# Patient Record
Sex: Female | Born: 1935 | Race: White | Hispanic: No | Smoking: Never smoker
Health system: Southern US, Community
[De-identification: ages and names within clinical notes are randomized; demographics above are authoritative.]

## PROBLEM LIST (undated history)

## (undated) DIAGNOSIS — Z8719 Personal history of other diseases of the digestive system: Secondary | ICD-10-CM

## (undated) DIAGNOSIS — M51369 Other intervertebral disc degeneration, lumbar region without mention of lumbar back pain or lower extremity pain: Secondary | ICD-10-CM

## (undated) DIAGNOSIS — F419 Anxiety disorder, unspecified: Secondary | ICD-10-CM

## (undated) DIAGNOSIS — K8071 Calculus of gallbladder and bile duct without cholecystitis with obstruction: Secondary | ICD-10-CM

## (undated) DIAGNOSIS — R0600 Dyspnea, unspecified: Secondary | ICD-10-CM

## (undated) DIAGNOSIS — B029 Zoster without complications: Secondary | ICD-10-CM

## (undated) DIAGNOSIS — K219 Gastro-esophageal reflux disease without esophagitis: Secondary | ICD-10-CM

## (undated) DIAGNOSIS — C189 Malignant neoplasm of colon, unspecified: Secondary | ICD-10-CM

## (undated) DIAGNOSIS — M6282 Rhabdomyolysis: Secondary | ICD-10-CM

## (undated) DIAGNOSIS — F1910 Other psychoactive substance abuse, uncomplicated: Secondary | ICD-10-CM

## (undated) DIAGNOSIS — I10 Essential (primary) hypertension: Secondary | ICD-10-CM

## (undated) DIAGNOSIS — T8149XA Infection following a procedure, other surgical site, initial encounter: Secondary | ICD-10-CM

## (undated) DIAGNOSIS — K5900 Constipation, unspecified: Secondary | ICD-10-CM

## (undated) DIAGNOSIS — M810 Age-related osteoporosis without current pathological fracture: Secondary | ICD-10-CM

## (undated) DIAGNOSIS — F32A Depression, unspecified: Secondary | ICD-10-CM

## (undated) DIAGNOSIS — M5136 Other intervertebral disc degeneration, lumbar region: Secondary | ICD-10-CM

## (undated) DIAGNOSIS — J849 Interstitial pulmonary disease, unspecified: Secondary | ICD-10-CM

## (undated) DIAGNOSIS — H269 Unspecified cataract: Secondary | ICD-10-CM

## (undated) DIAGNOSIS — M1990 Unspecified osteoarthritis, unspecified site: Secondary | ICD-10-CM

## (undated) DIAGNOSIS — R519 Headache, unspecified: Secondary | ICD-10-CM

## (undated) DIAGNOSIS — K8050 Calculus of bile duct without cholangitis or cholecystitis without obstruction: Secondary | ICD-10-CM

## (undated) DIAGNOSIS — H409 Unspecified glaucoma: Secondary | ICD-10-CM

## (undated) DIAGNOSIS — T7840XA Allergy, unspecified, initial encounter: Secondary | ICD-10-CM

## (undated) DIAGNOSIS — M869 Osteomyelitis, unspecified: Secondary | ICD-10-CM

## (undated) DIAGNOSIS — E785 Hyperlipidemia, unspecified: Secondary | ICD-10-CM

## (undated) HISTORY — PX: COLON SURGERY: SHX602

## (undated) HISTORY — PX: HERNIA REPAIR: SHX51

## (undated) HISTORY — DX: Unspecified osteoarthritis, unspecified site: M19.90

## (undated) HISTORY — PX: COLONOSCOPY: SHX174

## (undated) HISTORY — PX: CHOLECYSTECTOMY: SHX55

## (undated) HISTORY — PX: CARDIAC CATHETERIZATION: SHX172

## (undated) HISTORY — PX: WRIST SURGERY: SHX841

## (undated) HISTORY — PX: TUBAL LIGATION: SHX77

## (undated) HISTORY — DX: Anxiety disorder, unspecified: F41.9

## (undated) HISTORY — PX: APPENDECTOMY: SHX54

## (undated) HISTORY — PX: MULTIPLE TOOTH EXTRACTIONS: SHX2053

## (undated) HISTORY — PX: TONSILLECTOMY: SUR1361

## (undated) HISTORY — PX: OTHER SURGICAL HISTORY: SHX169

## (undated) HISTORY — DX: Allergy, unspecified, initial encounter: T78.40XA

## (undated) HISTORY — DX: Unspecified cataract: H26.9

## (undated) HISTORY — DX: Age-related osteoporosis without current pathological fracture: M81.0

## (undated) HISTORY — PX: EYE SURGERY: SHX253

## (undated) HISTORY — PX: FRACTURE SURGERY: SHX138

## (undated) HISTORY — PX: UPPER GI ENDOSCOPY: SHX6162

## (undated) HISTORY — DX: Other psychoactive substance abuse, uncomplicated: F19.10

---

## 2002-11-07 DIAGNOSIS — I10 Essential (primary) hypertension: Secondary | ICD-10-CM | POA: Diagnosis present

## 2011-12-21 ENCOUNTER — Ambulatory Visit: Payer: Self-pay | Admitting: Family Medicine

## 2012-01-10 ENCOUNTER — Ambulatory Visit: Payer: Self-pay | Admitting: Family Medicine

## 2012-12-26 DIAGNOSIS — F32A Depression, unspecified: Secondary | ICD-10-CM | POA: Insufficient documentation

## 2012-12-26 DIAGNOSIS — K219 Gastro-esophageal reflux disease without esophagitis: Secondary | ICD-10-CM | POA: Diagnosis present

## 2014-01-29 DIAGNOSIS — G4733 Obstructive sleep apnea (adult) (pediatric): Secondary | ICD-10-CM

## 2014-01-29 HISTORY — DX: Obstructive sleep apnea (adult) (pediatric): G47.33

## 2017-08-02 ENCOUNTER — Ambulatory Visit: Payer: Self-pay | Admitting: Family Medicine

## 2018-02-21 ENCOUNTER — Other Ambulatory Visit: Payer: Self-pay | Admitting: Family Medicine

## 2018-02-21 ENCOUNTER — Ambulatory Visit
Admission: RE | Admit: 2018-02-21 | Discharge: 2018-02-21 | Disposition: A | Discharge status: Home / Self Care | Payer: Medicare HMO | Source: Ambulatory Visit | Attending: Family Medicine | Admitting: Family Medicine

## 2018-02-21 DIAGNOSIS — M5134 Other intervertebral disc degeneration, thoracic region: Secondary | ICD-10-CM | POA: Diagnosis not present

## 2018-02-21 DIAGNOSIS — R52 Pain, unspecified: Secondary | ICD-10-CM

## 2018-02-21 DIAGNOSIS — X58XXXA Exposure to other specified factors, initial encounter: Secondary | ICD-10-CM | POA: Insufficient documentation

## 2018-02-21 DIAGNOSIS — M546 Pain in thoracic spine: Secondary | ICD-10-CM | POA: Diagnosis present

## 2018-02-21 DIAGNOSIS — S22009A Unspecified fracture of unspecified thoracic vertebra, initial encounter for closed fracture: Secondary | ICD-10-CM | POA: Diagnosis not present

## 2018-02-24 ENCOUNTER — Other Ambulatory Visit: Payer: Self-pay | Admitting: Family Medicine

## 2018-02-27 ENCOUNTER — Other Ambulatory Visit: Payer: Self-pay | Admitting: Family Medicine

## 2018-02-27 DIAGNOSIS — M4854XA Collapsed vertebra, not elsewhere classified, thoracic region, initial encounter for fracture: Secondary | ICD-10-CM

## 2018-03-06 ENCOUNTER — Ambulatory Visit (HOSPITAL_COMMUNITY)
Admission: RE | Admit: 2018-03-06 | Discharge: 2018-03-06 | Disposition: A | Discharge status: Home / Self Care | Payer: Medicare HMO | Source: Ambulatory Visit | Attending: Family Medicine | Admitting: Family Medicine

## 2018-03-06 DIAGNOSIS — M4856XA Collapsed vertebra, not elsewhere classified, lumbar region, initial encounter for fracture: Secondary | ICD-10-CM | POA: Insufficient documentation

## 2018-03-06 DIAGNOSIS — M4854XA Collapsed vertebra, not elsewhere classified, thoracic region, initial encounter for fracture: Secondary | ICD-10-CM | POA: Diagnosis present

## 2020-10-08 DIAGNOSIS — Z20822 Contact with and (suspected) exposure to covid-19: Secondary | ICD-10-CM | POA: Diagnosis not present

## 2020-11-17 DIAGNOSIS — E785 Hyperlipidemia, unspecified: Secondary | ICD-10-CM | POA: Diagnosis not present

## 2020-11-17 DIAGNOSIS — L659 Nonscarring hair loss, unspecified: Secondary | ICD-10-CM | POA: Diagnosis not present

## 2020-11-17 DIAGNOSIS — J301 Allergic rhinitis due to pollen: Secondary | ICD-10-CM | POA: Diagnosis not present

## 2020-11-17 DIAGNOSIS — R69 Illness, unspecified: Secondary | ICD-10-CM | POA: Diagnosis not present

## 2020-11-17 DIAGNOSIS — H6123 Impacted cerumen, bilateral: Secondary | ICD-10-CM | POA: Diagnosis not present

## 2020-11-17 DIAGNOSIS — Z0000 Encounter for general adult medical examination without abnormal findings: Secondary | ICD-10-CM | POA: Diagnosis not present

## 2020-11-17 DIAGNOSIS — M810 Age-related osteoporosis without current pathological fracture: Secondary | ICD-10-CM | POA: Diagnosis not present

## 2020-11-17 DIAGNOSIS — I10 Essential (primary) hypertension: Secondary | ICD-10-CM | POA: Diagnosis not present

## 2020-11-20 DIAGNOSIS — H40153 Residual stage of open-angle glaucoma, bilateral: Secondary | ICD-10-CM | POA: Diagnosis not present

## 2020-11-20 DIAGNOSIS — H524 Presbyopia: Secondary | ICD-10-CM | POA: Diagnosis not present

## 2020-11-25 DIAGNOSIS — H6121 Impacted cerumen, right ear: Secondary | ICD-10-CM | POA: Diagnosis not present

## 2020-11-25 DIAGNOSIS — H903 Sensorineural hearing loss, bilateral: Secondary | ICD-10-CM | POA: Diagnosis not present

## 2020-12-01 DIAGNOSIS — M810 Age-related osteoporosis without current pathological fracture: Secondary | ICD-10-CM | POA: Diagnosis not present

## 2020-12-01 DIAGNOSIS — E559 Vitamin D deficiency, unspecified: Secondary | ICD-10-CM | POA: Diagnosis not present

## 2020-12-01 DIAGNOSIS — R7989 Other specified abnormal findings of blood chemistry: Secondary | ICD-10-CM | POA: Diagnosis not present

## 2021-02-12 DIAGNOSIS — R1032 Left lower quadrant pain: Secondary | ICD-10-CM | POA: Diagnosis not present

## 2021-02-12 DIAGNOSIS — R198 Other specified symptoms and signs involving the digestive system and abdomen: Secondary | ICD-10-CM | POA: Diagnosis not present

## 2021-02-12 DIAGNOSIS — R195 Other fecal abnormalities: Secondary | ICD-10-CM | POA: Diagnosis not present

## 2021-03-04 DIAGNOSIS — K5900 Constipation, unspecified: Secondary | ICD-10-CM | POA: Diagnosis not present

## 2021-03-04 DIAGNOSIS — Z85038 Personal history of other malignant neoplasm of large intestine: Secondary | ICD-10-CM | POA: Diagnosis not present

## 2021-05-06 DIAGNOSIS — M810 Age-related osteoporosis without current pathological fracture: Secondary | ICD-10-CM | POA: Diagnosis not present

## 2021-05-06 DIAGNOSIS — E559 Vitamin D deficiency, unspecified: Secondary | ICD-10-CM | POA: Diagnosis not present

## 2021-05-06 DIAGNOSIS — R7989 Other specified abnormal findings of blood chemistry: Secondary | ICD-10-CM | POA: Diagnosis not present

## 2021-07-14 DIAGNOSIS — I10 Essential (primary) hypertension: Secondary | ICD-10-CM | POA: Diagnosis not present

## 2021-07-14 DIAGNOSIS — Z85038 Personal history of other malignant neoplasm of large intestine: Secondary | ICD-10-CM | POA: Diagnosis not present

## 2021-07-14 DIAGNOSIS — R0609 Other forms of dyspnea: Secondary | ICD-10-CM | POA: Diagnosis not present

## 2021-07-30 DIAGNOSIS — M810 Age-related osteoporosis without current pathological fracture: Secondary | ICD-10-CM | POA: Diagnosis not present

## 2021-08-12 DIAGNOSIS — I10 Essential (primary) hypertension: Secondary | ICD-10-CM | POA: Diagnosis not present

## 2021-08-23 DIAGNOSIS — S72009A Fracture of unspecified part of neck of unspecified femur, initial encounter for closed fracture: Secondary | ICD-10-CM

## 2021-08-23 HISTORY — DX: Fracture of unspecified part of neck of unspecified femur, initial encounter for closed fracture: S72.009A

## 2021-09-03 DIAGNOSIS — R0789 Other chest pain: Secondary | ICD-10-CM | POA: Diagnosis not present

## 2021-09-03 DIAGNOSIS — I998 Other disorder of circulatory system: Secondary | ICD-10-CM | POA: Diagnosis not present

## 2021-09-03 DIAGNOSIS — Z0131 Encounter for examination of blood pressure with abnormal findings: Secondary | ICD-10-CM | POA: Diagnosis not present

## 2021-09-07 DIAGNOSIS — I6523 Occlusion and stenosis of bilateral carotid arteries: Secondary | ICD-10-CM | POA: Diagnosis not present

## 2021-09-07 DIAGNOSIS — R0602 Shortness of breath: Secondary | ICD-10-CM | POA: Diagnosis not present

## 2021-09-07 DIAGNOSIS — I10 Essential (primary) hypertension: Secondary | ICD-10-CM | POA: Diagnosis not present

## 2021-09-07 DIAGNOSIS — E782 Mixed hyperlipidemia: Secondary | ICD-10-CM | POA: Diagnosis not present

## 2021-09-14 DIAGNOSIS — R0602 Shortness of breath: Secondary | ICD-10-CM | POA: Diagnosis not present

## 2021-09-24 DIAGNOSIS — R0602 Shortness of breath: Secondary | ICD-10-CM | POA: Diagnosis not present

## 2021-09-24 DIAGNOSIS — I6523 Occlusion and stenosis of bilateral carotid arteries: Secondary | ICD-10-CM | POA: Diagnosis not present

## 2021-10-05 DIAGNOSIS — I10 Essential (primary) hypertension: Secondary | ICD-10-CM | POA: Diagnosis not present

## 2021-10-05 DIAGNOSIS — G4733 Obstructive sleep apnea (adult) (pediatric): Secondary | ICD-10-CM | POA: Diagnosis not present

## 2021-10-05 DIAGNOSIS — R0602 Shortness of breath: Secondary | ICD-10-CM | POA: Diagnosis not present

## 2021-10-05 DIAGNOSIS — E782 Mixed hyperlipidemia: Secondary | ICD-10-CM | POA: Diagnosis not present

## 2021-11-23 DIAGNOSIS — R8290 Unspecified abnormal findings in urine: Secondary | ICD-10-CM | POA: Diagnosis not present

## 2021-11-23 DIAGNOSIS — Z0000 Encounter for general adult medical examination without abnormal findings: Secondary | ICD-10-CM | POA: Diagnosis not present

## 2021-11-23 DIAGNOSIS — R69 Illness, unspecified: Secondary | ICD-10-CM | POA: Diagnosis not present

## 2021-11-23 DIAGNOSIS — E041 Nontoxic single thyroid nodule: Secondary | ICD-10-CM | POA: Diagnosis not present

## 2021-11-23 DIAGNOSIS — I10 Essential (primary) hypertension: Secondary | ICD-10-CM | POA: Diagnosis not present

## 2021-11-23 DIAGNOSIS — Z79899 Other long term (current) drug therapy: Secondary | ICD-10-CM | POA: Diagnosis not present

## 2021-11-23 DIAGNOSIS — Z1330 Encounter for screening examination for mental health and behavioral disorders, unspecified: Secondary | ICD-10-CM | POA: Diagnosis not present

## 2021-12-09 DIAGNOSIS — E559 Vitamin D deficiency, unspecified: Secondary | ICD-10-CM | POA: Diagnosis not present

## 2021-12-09 DIAGNOSIS — M810 Age-related osteoporosis without current pathological fracture: Secondary | ICD-10-CM | POA: Diagnosis not present

## 2021-12-25 ENCOUNTER — Other Ambulatory Visit: Payer: Self-pay

## 2021-12-25 ENCOUNTER — Emergency Department: Payer: Medicare HMO

## 2021-12-25 ENCOUNTER — Inpatient Hospital Stay
Admission: EM | Admit: 2021-12-25 | Discharge: 2021-12-31 | DRG: 956 | Disposition: A | Discharge status: Skilled Nursing Facility | Payer: Medicare HMO | Attending: Osteopathic Medicine | Admitting: Osteopathic Medicine

## 2021-12-25 ENCOUNTER — Encounter: Payer: Self-pay | Admitting: Internal Medicine

## 2021-12-25 DIAGNOSIS — S72142A Displaced intertrochanteric fracture of left femur, initial encounter for closed fracture: Principal | ICD-10-CM | POA: Diagnosis present

## 2021-12-25 DIAGNOSIS — Z20822 Contact with and (suspected) exposure to covid-19: Secondary | ICD-10-CM | POA: Diagnosis present

## 2021-12-25 DIAGNOSIS — R631 Polydipsia: Secondary | ICD-10-CM | POA: Diagnosis present

## 2021-12-25 DIAGNOSIS — G319 Degenerative disease of nervous system, unspecified: Secondary | ICD-10-CM | POA: Diagnosis not present

## 2021-12-25 DIAGNOSIS — E860 Dehydration: Secondary | ICD-10-CM | POA: Diagnosis not present

## 2021-12-25 DIAGNOSIS — Z9889 Other specified postprocedural states: Secondary | ICD-10-CM | POA: Diagnosis not present

## 2021-12-25 DIAGNOSIS — R0689 Other abnormalities of breathing: Secondary | ICD-10-CM | POA: Diagnosis not present

## 2021-12-25 DIAGNOSIS — R778 Other specified abnormalities of plasma proteins: Secondary | ICD-10-CM

## 2021-12-25 DIAGNOSIS — E871 Hypo-osmolality and hyponatremia: Secondary | ICD-10-CM | POA: Diagnosis not present

## 2021-12-25 DIAGNOSIS — R053 Chronic cough: Secondary | ICD-10-CM | POA: Diagnosis not present

## 2021-12-25 DIAGNOSIS — E222 Syndrome of inappropriate secretion of antidiuretic hormone: Secondary | ICD-10-CM | POA: Diagnosis present

## 2021-12-25 DIAGNOSIS — Z01810 Encounter for preprocedural cardiovascular examination: Secondary | ICD-10-CM | POA: Diagnosis not present

## 2021-12-25 DIAGNOSIS — R531 Weakness: Secondary | ICD-10-CM | POA: Diagnosis not present

## 2021-12-25 DIAGNOSIS — I248 Other forms of acute ischemic heart disease: Secondary | ICD-10-CM | POA: Diagnosis not present

## 2021-12-25 DIAGNOSIS — S72009A Fracture of unspecified part of neck of unspecified femur, initial encounter for closed fracture: Secondary | ICD-10-CM

## 2021-12-25 DIAGNOSIS — S0990XA Unspecified injury of head, initial encounter: Secondary | ICD-10-CM | POA: Diagnosis not present

## 2021-12-25 DIAGNOSIS — Z743 Need for continuous supervision: Secondary | ICD-10-CM | POA: Diagnosis not present

## 2021-12-25 DIAGNOSIS — S72352A Displaced comminuted fracture of shaft of left femur, initial encounter for closed fracture: Secondary | ICD-10-CM | POA: Diagnosis not present

## 2021-12-25 DIAGNOSIS — W010XXA Fall on same level from slipping, tripping and stumbling without subsequent striking against object, initial encounter: Secondary | ICD-10-CM | POA: Diagnosis present

## 2021-12-25 DIAGNOSIS — S72002A Fracture of unspecified part of neck of left femur, initial encounter for closed fracture: Principal | ICD-10-CM

## 2021-12-25 DIAGNOSIS — K219 Gastro-esophageal reflux disease without esophagitis: Secondary | ICD-10-CM | POA: Diagnosis not present

## 2021-12-25 DIAGNOSIS — I10 Essential (primary) hypertension: Secondary | ICD-10-CM | POA: Diagnosis not present

## 2021-12-25 DIAGNOSIS — S7222XA Displaced subtrochanteric fracture of left femur, initial encounter for closed fracture: Secondary | ICD-10-CM | POA: Diagnosis not present

## 2021-12-25 DIAGNOSIS — J320 Chronic maxillary sinusitis: Secondary | ICD-10-CM | POA: Diagnosis not present

## 2021-12-25 DIAGNOSIS — Z751 Person awaiting admission to adequate facility elsewhere: Secondary | ICD-10-CM | POA: Diagnosis not present

## 2021-12-25 DIAGNOSIS — S199XXA Unspecified injury of neck, initial encounter: Secondary | ICD-10-CM | POA: Diagnosis not present

## 2021-12-25 DIAGNOSIS — Z01818 Encounter for other preprocedural examination: Secondary | ICD-10-CM | POA: Diagnosis not present

## 2021-12-25 DIAGNOSIS — Y92002 Bathroom of unspecified non-institutional (private) residence single-family (private) house as the place of occurrence of the external cause: Secondary | ICD-10-CM

## 2021-12-25 DIAGNOSIS — E041 Nontoxic single thyroid nodule: Secondary | ICD-10-CM | POA: Diagnosis not present

## 2021-12-25 DIAGNOSIS — M25552 Pain in left hip: Secondary | ICD-10-CM | POA: Diagnosis not present

## 2021-12-25 DIAGNOSIS — M6282 Rhabdomyolysis: Secondary | ICD-10-CM

## 2021-12-25 DIAGNOSIS — G939 Disorder of brain, unspecified: Secondary | ICD-10-CM | POA: Diagnosis not present

## 2021-12-25 DIAGNOSIS — M4312 Spondylolisthesis, cervical region: Secondary | ICD-10-CM | POA: Diagnosis not present

## 2021-12-25 DIAGNOSIS — T796XXA Traumatic ischemia of muscle, initial encounter: Secondary | ICD-10-CM

## 2021-12-25 DIAGNOSIS — J849 Interstitial pulmonary disease, unspecified: Secondary | ICD-10-CM | POA: Diagnosis not present

## 2021-12-25 DIAGNOSIS — W19XXXA Unspecified fall, initial encounter: Secondary | ICD-10-CM | POA: Diagnosis not present

## 2021-12-25 DIAGNOSIS — D62 Acute posthemorrhagic anemia: Secondary | ICD-10-CM

## 2021-12-25 DIAGNOSIS — Z79899 Other long term (current) drug therapy: Secondary | ICD-10-CM | POA: Diagnosis not present

## 2021-12-25 DIAGNOSIS — R52 Pain, unspecified: Secondary | ICD-10-CM | POA: Diagnosis not present

## 2021-12-25 DIAGNOSIS — M47812 Spondylosis without myelopathy or radiculopathy, cervical region: Secondary | ICD-10-CM | POA: Diagnosis not present

## 2021-12-25 DIAGNOSIS — R7989 Other specified abnormal findings of blood chemistry: Secondary | ICD-10-CM

## 2021-12-25 DIAGNOSIS — R000 Tachycardia, unspecified: Secondary | ICD-10-CM | POA: Diagnosis not present

## 2021-12-25 HISTORY — DX: Fracture of unspecified part of neck of unspecified femur, initial encounter for closed fracture: S72.009A

## 2021-12-25 LAB — COMPREHENSIVE METABOLIC PANEL
ALT: 32 U/L (ref 0–44)
AST: 82 U/L — ABNORMAL HIGH (ref 15–41)
Albumin: 3.8 g/dL (ref 3.5–5.0)
Alkaline Phosphatase: 44 U/L (ref 38–126)
Anion gap: 11 (ref 5–15)
BUN: 33 mg/dL — ABNORMAL HIGH (ref 8–23)
CO2: 22 mmol/L (ref 22–32)
Calcium: 9.1 mg/dL (ref 8.9–10.3)
Chloride: 96 mmol/L — ABNORMAL LOW (ref 98–111)
Creatinine, Ser: 0.83 mg/dL (ref 0.44–1.00)
GFR, Estimated: >60 mL/min (ref >60)
Glucose, Bld: 149 mg/dL — ABNORMAL HIGH (ref 70–99)
Potassium: 4.3 mmol/L (ref 3.5–5.1)
Sodium: 129 mmol/L — ABNORMAL LOW (ref 135–145)
Total Bilirubin: 1.2 mg/dL (ref 0.3–1.2)
Total Protein: 7 g/dL (ref 6.5–8.1)

## 2021-12-25 LAB — CBC WITH DIFFERENTIAL/PLATELET
Abs Immature Granulocytes: 0.08 10*3/uL — ABNORMAL HIGH (ref 0.00–0.07)
Basophils Absolute: 0 10*3/uL (ref 0.0–0.1)
Basophils Relative: 0 %
Eosinophils Absolute: 0.2 10*3/uL (ref 0.0–0.5)
Eosinophils Relative: 1 %
HCT: 35.9 % — ABNORMAL LOW (ref 36.0–46.0)
Hemoglobin: 12.3 g/dL (ref 12.0–15.0)
Immature Granulocytes: 1 %
Lymphocytes Relative: 9 %
Lymphs Abs: 1.6 10*3/uL (ref 0.7–4.0)
MCH: 33 pg (ref 26.0–34.0)
MCHC: 34.3 g/dL (ref 30.0–36.0)
MCV: 96.2 fL (ref 80.0–100.0)
Monocytes Absolute: 1.6 10*3/uL — ABNORMAL HIGH (ref 0.1–1.0)
Monocytes Relative: 9 %
Neutro Abs: 14.3 10*3/uL — ABNORMAL HIGH (ref 1.7–7.7)
Neutrophils Relative %: 80 %
Platelets: 258 10*3/uL (ref 150–400)
RBC: 3.73 MIL/uL — ABNORMAL LOW (ref 3.87–5.11)
RDW: 12.1 % (ref 11.5–15.5)
WBC: 17.8 10*3/uL — ABNORMAL HIGH (ref 4.0–10.5)
nRBC: 0.2 % (ref 0.0–0.2)

## 2021-12-25 LAB — CBC
HCT: 33.2 % — ABNORMAL LOW (ref 36.0–46.0)
Hemoglobin: 11.4 g/dL — ABNORMAL LOW (ref 12.0–15.0)
MCH: 33.4 pg (ref 26.0–34.0)
MCHC: 34.3 g/dL (ref 30.0–36.0)
MCV: 97.4 fL (ref 80.0–100.0)
Platelets: 221 10*3/uL (ref 150–400)
RBC: 3.41 MIL/uL — ABNORMAL LOW (ref 3.87–5.11)
RDW: 12.3 % (ref 11.5–15.5)
WBC: 16.6 10*3/uL — ABNORMAL HIGH (ref 4.0–10.5)
nRBC: 0.2 % (ref 0.0–0.2)

## 2021-12-25 LAB — CREATININE, SERUM
Creatinine, Ser: 0.62 mg/dL (ref 0.44–1.00)
GFR, Estimated: >60 mL/min (ref >60)

## 2021-12-25 LAB — URINALYSIS, ROUTINE W REFLEX MICROSCOPIC
Bilirubin Urine: NEGATIVE
Glucose, UA: 50 mg/dL — AB
Ketones, ur: NEGATIVE mg/dL
Nitrite: NEGATIVE
Protein, ur: 30 mg/dL — AB
Specific Gravity, Urine: 1.019 (ref 1.005–1.030)
pH: 5 (ref 5.0–8.0)

## 2021-12-25 LAB — CK: Total CK: 2655 U/L — ABNORMAL HIGH (ref 38–234)

## 2021-12-25 LAB — TROPONIN I (HIGH SENSITIVITY)
Troponin I (High Sensitivity): 67 ng/L — ABNORMAL HIGH (ref <18)
Troponin I (High Sensitivity): 54 ng/L — ABNORMAL HIGH (ref <18)

## 2021-12-25 MED ORDER — BRIMONIDINE TARTRATE 0.2 % OP SOLN
1.0000 [drp] | Freq: Two times a day (BID) | OPHTHALMIC | Status: DC
  Administered 2021-12-26 – 2021-12-31 (×12): 1 [drp] via OPHTHALMIC
  Filled 2021-12-25: qty 5

## 2021-12-25 MED ORDER — SODIUM CHLORIDE 0.9 % IV SOLN: INTRAVENOUS | Status: DC

## 2021-12-25 MED ORDER — SODIUM CHLORIDE 0.9 % IV BOLUS
500.0000 mL | Freq: Once | INTRAVENOUS | Status: AC
  Administered 2021-12-25: 500 mL via INTRAVENOUS

## 2021-12-25 MED ORDER — CEFAZOLIN SODIUM-DEXTROSE 2-4 GM/100ML-% IV SOLN
2.0000 g | INTRAVENOUS | Status: AC
  Administered 2021-12-26: 2 g via INTRAVENOUS
  Filled 2021-12-25: qty 100

## 2021-12-25 MED ORDER — LATANOPROST 0.005 % OP SOLN
1.0000 [drp] | Freq: Every day | OPHTHALMIC | Status: DC
  Administered 2021-12-26 – 2021-12-30 (×6): 1 [drp] via OPHTHALMIC
  Filled 2021-12-25: qty 2.5

## 2021-12-25 MED ORDER — ONDANSETRON HCL 4 MG/2ML IJ SOLN: 4.0000 mg | Freq: Four times a day (QID) | INTRAMUSCULAR | Status: DC | PRN

## 2021-12-25 MED ORDER — ONDANSETRON HCL 4 MG/2ML IJ SOLN
4.0000 mg | Freq: Once | INTRAMUSCULAR | Status: AC
  Administered 2021-12-25: 4 mg via INTRAVENOUS
  Filled 2021-12-25: qty 2

## 2021-12-25 MED ORDER — HYDROCODONE-ACETAMINOPHEN 5-325 MG PO TABS
1.0000 | ORAL_TABLET | Freq: Four times a day (QID) | ORAL | Status: DC | PRN
  Administered 2021-12-27 – 2021-12-31 (×16): 2 via ORAL
  Filled 2021-12-25 (×16): qty 2

## 2021-12-25 MED ORDER — ENOXAPARIN SODIUM 40 MG/0.4ML IJ SOSY: 40.0000 mg | PREFILLED_SYRINGE | INTRAMUSCULAR | Status: DC

## 2021-12-25 MED ORDER — IPRATROPIUM-ALBUTEROL 0.5-2.5 (3) MG/3ML IN SOLN
3.0000 mL | Freq: Four times a day (QID) | RESPIRATORY_TRACT | Status: DC | PRN
  Administered 2021-12-25: 3 mL via RESPIRATORY_TRACT
  Filled 2021-12-25: qty 3

## 2021-12-25 MED ORDER — MORPHINE SULFATE (PF) 2 MG/ML IV SOLN
0.5000 mg | INTRAVENOUS | Status: DC | PRN
  Administered 2021-12-25 – 2021-12-27 (×10): 0.5 mg via INTRAVENOUS
  Filled 2021-12-25 (×10): qty 1

## 2021-12-25 MED ORDER — ENOXAPARIN SODIUM 40 MG/0.4ML IJ SOSY
40.0000 mg | PREFILLED_SYRINGE | INTRAMUSCULAR | Status: DC
  Administered 2021-12-25 – 2021-12-30 (×6): 40 mg via SUBCUTANEOUS
  Filled 2021-12-25 (×6): qty 0.4

## 2021-12-25 MED ORDER — MORPHINE SULFATE (PF) 4 MG/ML IV SOLN
4.0000 mg | Freq: Once | INTRAVENOUS | Status: AC
  Administered 2021-12-25: 4 mg via INTRAVENOUS
  Filled 2021-12-25: qty 1

## 2021-12-25 NOTE — ED Provider Notes (Signed)
? ?[facility] ?Provider Note ? ? ? Event Date/Time  ? First MD Initiated Contact with Patient 12/25/21 1510   ?  (approximate) ? ? ?History  ? ?Fall and Shortness of Breath ? ? ?HPI ? ?Melanie Carrillo is a 87 y.o. female with history of hypertension and as listed in EMR presents to the emergency department for treatment and evaluation after she tripped and fell last night. She laid in the floor overnight into this morning. Her daughter found her and assisted her to the bed, but patient wanted to rest for a while before coming to the ER. She is complaining of pain in the left hip.  ? ?  ? ? ?Physical Exam  ? ?Triage Vital Signs: ?ED Triage Vitals  ?Enc Vitals Group  ?   BP 12/25/21 1422 (!) 164/90  ?   Pulse Rate 12/25/21 1422 (!) 130  ?   Resp 12/25/21 1422 19  ?   Temp 12/25/21 1426 98.2 ?F (36.8 ?C)  ?   Temp Source 12/25/21 1426 Oral  ?   SpO2 12/25/21 1422 99 %  ?   Weight 12/25/21 1422 162 lb 11.2 oz (73.8 kg)  ?   Height 12/25/21 1422 '5\' 4"'$  (1.626 m)  ?   Head Circumference --   ?   Peak Flow --   ?   Pain Score --   ?   Pain Loc --   ?   Pain Edu? --   ?   Excl. in [place]? --   ? ? ?Most recent vital signs: ?Vitals:  ? 12/25/21 1800 12/25/21 1830  ?BP: (!) 162/80 (!) 177/87  ?Pulse: (!) 26 (!) 105  ?Resp: (!) 22 (!) 27  ?Temp:    ?SpO2: 98% 100%  ? ? ?General: Awake, no distress.  ?CV:  Good peripheral perfusion.  ?Resp:  Normal effort.  ?Abd:  No distention.  ?Other:  Shortening and rotation of the left lower extremity.Contusion of the left forearm. ? ? ?ED Results / Procedures / Treatments  ? ?Labs ?(all labs ordered are listed, but only abnormal results are displayed) ?Labs Reviewed  ?COMPREHENSIVE METABOLIC PANEL - Abnormal; Notable for the following components:  ?    Result Value  ? Sodium 129 (*)   ? Chloride 96 (*)   ? Glucose, Bld 149 (*)   ? BUN 33 (*)   ? AST 82 (*)   ? All other components within normal limits  ?CBC WITH DIFFERENTIAL/PLATELET - Abnormal; Notable for the following  components:  ? WBC 17.8 (*)   ? RBC 3.73 (*)   ? HCT 35.9 (*)   ? Neutro Abs 14.3 (*)   ? Monocytes Absolute 1.6 (*)   ? Abs Immature Granulocytes 0.08 (*)   ? All other components within normal limits  ?URINALYSIS, ROUTINE W REFLEX MICROSCOPIC - Abnormal; Notable for the following components:  ? Color, Urine YELLOW (*)   ? APPearance CLEAR (*)   ? Glucose, UA 50 (*)   ? Hgb urine dipstick SMALL (*)   ? Protein, ur 30 (*)   ? Leukocytes,Ua TRACE (*)   ? Bacteria, UA RARE (*)   ? All other components within normal limits  ?CK - Abnormal; Notable for the following components:  ? Total CK 2,655 (*)   ? All other components within normal limits  ?CBC - Abnormal; Notable for the following components:  ? WBC 16.6 (*)   ? RBC 3.41 (*)   ? Hemoglobin  11.4 (*)   ? HCT 33.2 (*)   ? All other components within normal limits  ?TROPONIN I (HIGH SENSITIVITY) - Abnormal; Notable for the following components:  ? Troponin I (High Sensitivity) 67 (*)   ? All other components within normal limits  ?CREATININE, SERUM  ?CBC  ?CK  ?MAGNESIUM  ?TROPONIN I (HIGH SENSITIVITY)  ? ? ? ?EKG ? ?Sinus tachycardia with rate of 128 with frequent PVCs. ? ? ?RADIOLOGY ? ?Image and radiology report reviewed by me.  ? ?Left hip image shows acute comminuted displaced and angulated proximal femoral fracture. ? ?PROCEDURES: ? ?Critical Care performed: No ? ?Procedures ? ? ?MEDICATIONS ORDERED IN ED: ?Medications  ?HYDROcodone-acetaminophen (NORCO/VICODIN) 5-325 MG per tablet 1-2 tablet (has no administration in time range)  ?morphine (PF) 2 MG/ML injection 0.5 mg (0.5 mg Intravenous Given 12/25/21 1848)  ?0.9 %  sodium chloride infusion ( Intravenous New Bag/Given 12/25/21 1828)  ?ipratropium-albuterol (DUONEB) 0.5-2.5 (3) MG/3ML nebulizer solution 3 mL (3 mLs Nebulization Given 12/25/21 1818)  ?enoxaparin (LOVENOX) injection 40 mg (has no administration in time range)  ?ceFAZolin (ANCEF) IVPB 2g/100 mL premix (has no administration in time range)  ?sodium  chloride 0.9 % bolus 500 mL (0 mLs Intravenous Stopped 12/25/21 1600)  ?morphine (PF) 4 MG/ML injection 4 mg (4 mg Intravenous Given 12/25/21 1520)  ?ondansetron (ZOFRAN) injection 4 mg (4 mg Intravenous Given 12/25/21 1520)  ? ? ? ?IMPRESSION / MDM / ASSESSMENT AND PLAN / ED COURSE  ? ?I have reviewed the triage note. ? ?Differential diagnosis includes, but is not limited to: Hip fracture, hip dislocation, rhabdomyolysis, cardiac event. ? ?87 year old female presenting to the emergency department for treatment and evaluation after she tripped and fell at home last night.  She laid in the floor until this morning.  See HPI for further details. ? ?Patient is noted to be tachycardic with frequent PVCs.  Likely is in rhabdomyolysis as well.  She will be given fluids and monitor closely. ? ?Clinical Course as of 12/25/21 1902  ?Fri Dec 25, 2021  ?1420 Lab studies reviewed.  CK has elevated at 2655.  She is mildly hyponatremic at 129.  Initial troponin is 67.  White blood cell count 17.8. ? ?IV fluids infusing.  Heart rate is improving and PVCs are now infrequent. [CT]  ?1540 Orthopedic and hospitalist consults requested. ?Results of labs and imaging discussed with the patient and family. They are agreeable to the plan of admission. [CT]  ?[id number] Patient accepted for admission by Dr. Roosevelt Locks. ?Dr. Sharlet Salina currently in a procedure and will call back. [CT]  ?  ?Clinical Course User Index ?[CT] Merri Dimaano B, FNP  ? ? ? ?FINAL CLINICAL IMPRESSION(S) / ED DIAGNOSES  ? ?Final diagnoses:  ?Closed fracture of left hip, initial encounter ([place])  ?Traumatic rhabdomyolysis, initial encounter [facility])  ? ? ? ?Rx / DC Orders  ? ?ED Discharge Orders   ? ? None  ? ?  ? ? ? ?Note:  This document was prepared using Dragon voice recognition software and may include unintentional dictation errors. ?  ?Victorino Dike, FNP ?12/25/21 1903 ? ?  ?Naaman Plummer, MD ?12/26/21 2325 ? ?

## 2021-12-25 NOTE — Assessment & Plan Note (Addendum)
From fall, condition improved. ?

## 2021-12-25 NOTE — Plan of Care (Signed)

## 2021-12-25 NOTE — ED Notes (Signed)
Pelvic binder placed by EMT prior to arrival taken down at this time at request of Melanie Abraham, NP for Xray.  ?

## 2021-12-25 NOTE — Assessment & Plan Note (Addendum)
Stable, continue as needed DuoNeb. ?

## 2021-12-25 NOTE — Assessment & Plan Note (Addendum)
Status post surgery.   ?Seen by PT/OT, currently pending nursing home placement. ?

## 2021-12-25 NOTE — Assessment & Plan Note (Addendum)
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 Sodium has remained stable ?Continue salt tablets.  Patient is not drinking excessive water while in the hospital ?Will recheck BMP, Osm (serum and urine), urine sodium in AM ?

## 2021-12-25 NOTE — H&P (Signed)
?History and Physical  ? ? ?Patient: Melanie Carrillo ZOX:[id number] DOB: June 13, 1935 ?DOA: 12/25/2021 ?DOS: the patient was seen and examined on 12/25/2021 ?PCP: Gayland Curry, MD  ?Patient coming from: Home, patient lives independently. ? ?Chief Complaint:  ?Chief Complaint  ?Patient presents with  ? Fall  ? Shortness of Breath  ? ?HPI: Melanie Carrillo is a 87 y.o. female with medical history significant of interstitial lung disease who present to the hospital with hip fracture. ?Patient currently lives alone at home, she has interstitial lung disease, she has some short of breath with exertion and a cough.  But no hypoxia.  She went to the bathroom last night, tripped and fell on the ground.  She sustained a fracture in her left hip.  Dr. Sharlet Salina was consulted by emergency room for surgery. ?Review of Systems: As mentioned in the history of present illness. All other systems reviewed and are negative. ? ?Past medical history: Interstitial lung disease. ?Social History: Does not smoke or drink alcohol. ? ?No Known Allergies ? ?Family history. ?No premature heart disease or stroke. ? ?Prior to Admission medications   ?Medication Sig Start Date End Date Taking? Authorizing Provider  ?amLODipine (NORVASC) 10 MG tablet Take 10 mg by mouth daily. 12/03/21   [provider]  ?brimonidine (ALPHAGAN) 0.2 % ophthalmic solution 1 drop 2 (two) times daily. 12/15/21   [provider]  ?latanoprost (XALATAN) 0.005 % ophthalmic solution 1 drop at bedtime. 11/13/21   [provider]  ?losartan (COZAAR) 100 MG tablet Take 100 mg by mouth daily. 12/03/21   [provider]  ?lovastatin (MEVACOR) 40 MG tablet Take 1 tablet by mouth daily. 11/10/21   [provider]  ?omeprazole (PRILOSEC) 40 MG capsule Take 40 mg by mouth daily. 10/05/21   [provider]  ?sertraline (ZOLOFT) 25 MG tablet Take 25 mg by mouth daily. 08/03/21   [provider]  ? ? ?Physical Exam: ?Vitals:  ?  12/25/21 1426 12/25/21 1430 12/25/21 1530 12/25/21 1600  ?BP:  (!) 167/93 (!) 141/70 134/66  ?Pulse:  (!) 129 (!) 107 (!) 106  ?Resp:  '20 16 18  '$ ?Temp: 98.2 ?F (36.8 ?C)     ?TempSrc: Oral     ?SpO2:  97% 96% 95%  ?Weight:      ?Height:      ? ?Physical Exam ?Constitutional:   ?   General: She is not in acute distress. ?   Appearance: She is well-developed. She is not toxic-appearing.  ?HENT:  ?   Head: Normocephalic and atraumatic.  ?Eyes:  ?   Extraocular Movements: Extraocular movements intact.  ?   Pupils: Pupils are equal, round, and reactive to light.  ?Neck:  ?   Thyroid: No thyromegaly.  ?   Vascular: No JVD.  ?Cardiovascular:  ?   Rate and Rhythm: Normal rate and regular rhythm.  ?   Heart sounds: No murmur heard. ?Pulmonary:  ?   Effort: Pulmonary effort is normal. No tachypnea.  ?   Breath sounds: Normal breath sounds.  ?Abdominal:  ?   General: Bowel sounds are normal.  ?   Palpations: Abdomen is soft. There is no hepatomegaly.  ?   Tenderness: There is no abdominal tenderness.  ?Musculoskeletal:     ?   General: Normal range of motion.  ?   Cervical back: Normal range of motion.  ?   Right lower leg: No edema.  ?   Left lower leg: No edema.  ?Skin: ?  General: Skin is warm and dry.  ?   Coloration: Skin is not cyanotic.  ?Neurological:  ?   General: No focal deficit present.  ?   Mental Status: She is alert and oriented to person, place, and time.  ?Psychiatric:     ?   Mood and Affect: Mood normal.     ?   Behavior: Behavior normal.  ?  ?Data Reviewed: ? ?Reviewed the hip x-rays.  Reviewed all lab results. ? ?Assessment and Plan: ?* Hip fracture ([place]) ?Patient had a traumatic injury from fall, did not have syncope.  Dr. Sharlet Salina is consulted, patient does not have significant heart history, safe to go to surgery.  No additional work-up is needed. ? ?Elevated troponin ?Secondary to trauma and rhabdomyolysis.  No angina. ? ?Hyponatremia ?Appear to be secondary to dehydration, patient does not have prior  labs to compare.  Continue fluids, recheck BMP tomorrow. ? ?Rhabdomyolysis ?Patient had a fall, was on the floor overnight.  CPK over 2000.  We will continue some IV fluids, recheck a CK level tomorrow. ? ?Interstitial lung disease ([place]) ?Patient has chronic interstitial lung disease, currently no exacerbation.  She has a chronic cough, but no hypoxemia.  We will continue as needed albuterol treatment. ? ? ? ? ? Advance Care Planning:   Code Status: Full Code Full ? ?Consults: Orthopedics ? ?Family Communication: daughter at bedside ? ?Severity of Illness: ?The appropriate patient status for this patient is INPATIENT. Inpatient status is judged to be reasonable and necessary in order to provide the required intensity of service to ensure the patient's safety. The patient's presenting symptoms, physical exam findings, and initial radiographic and laboratory data in the context of their chronic comorbidities is felt to place them at high risk for further clinical deterioration. Furthermore, it is not anticipated that the patient will be medically stable for discharge from the hospital within 2 midnights of admission.  ? ?* I certify that at the point of admission it is my clinical judgment that the patient will require inpatient hospital care spanning beyond 2 midnights from the point of admission due to high intensity of service, high risk for further deterioration and high frequency of surveillance required.* ? ?Author: ?Sharen Hones, MD ?12/25/2021 4:36 PM ? ?For on call review [email or web address].  ?

## 2021-12-25 NOTE — Assessment & Plan Note (Addendum)
Demand ischemia from hip fracture. ?

## 2021-12-26 ENCOUNTER — Inpatient Hospital Stay: Payer: Medicare HMO | Admitting: Anesthesiology

## 2021-12-26 ENCOUNTER — Encounter
Admission: EM | Disposition: A | Discharge status: Skilled Nursing Facility | Payer: Self-pay | Source: Home / Self Care | Attending: Internal Medicine

## 2021-12-26 ENCOUNTER — Inpatient Hospital Stay: Payer: Medicare HMO

## 2021-12-26 DIAGNOSIS — E871 Hypo-osmolality and hyponatremia: Secondary | ICD-10-CM | POA: Diagnosis not present

## 2021-12-26 DIAGNOSIS — S72002A Fracture of unspecified part of neck of left femur, initial encounter for closed fracture: Secondary | ICD-10-CM | POA: Diagnosis not present

## 2021-12-26 DIAGNOSIS — J849 Interstitial pulmonary disease, unspecified: Secondary | ICD-10-CM | POA: Diagnosis not present

## 2021-12-26 HISTORY — PX: INTRAMEDULLARY (IM) NAIL INTERTROCHANTERIC: SHX5875

## 2021-12-26 LAB — BASIC METABOLIC PANEL
Anion gap: 7 (ref 5–15)
BUN: 27 mg/dL — ABNORMAL HIGH (ref 8–23)
CO2: 23 mmol/L (ref 22–32)
Calcium: 8.6 mg/dL — ABNORMAL LOW (ref 8.9–10.3)
Chloride: 99 mmol/L (ref 98–111)
Creatinine, Ser: 0.64 mg/dL (ref 0.44–1.00)
GFR, Estimated: >60 mL/min (ref >60)
Glucose, Bld: 119 mg/dL — ABNORMAL HIGH (ref 70–99)
Potassium: 4.9 mmol/L (ref 3.5–5.1)
Sodium: 129 mmol/L — ABNORMAL LOW (ref 135–145)

## 2021-12-26 LAB — CBC
HCT: 30.5 % — ABNORMAL LOW (ref 36.0–46.0)
Hemoglobin: 10.4 g/dL — ABNORMAL LOW (ref 12.0–15.0)
MCH: 33.8 pg (ref 26.0–34.0)
MCHC: 34.1 g/dL (ref 30.0–36.0)
MCV: 99 fL (ref 80.0–100.0)
Platelets: 186 10*3/uL (ref 150–400)
RBC: 3.08 MIL/uL — ABNORMAL LOW (ref 3.87–5.11)
RDW: 12.4 % (ref 11.5–15.5)
WBC: 14.6 10*3/uL — ABNORMAL HIGH (ref 4.0–10.5)
nRBC: 0 % (ref 0.0–0.2)

## 2021-12-26 LAB — CK: Total CK: 1310 U/L — ABNORMAL HIGH (ref 38–234)

## 2021-12-26 LAB — MAGNESIUM: Magnesium: 2.1 mg/dL (ref 1.7–2.4)

## 2021-12-26 LAB — RESP PANEL BY RT-PCR (FLU A&B, COVID) ARPGX2
Influenza A by PCR: NEGATIVE
Influenza B by PCR: NEGATIVE
SARS Coronavirus 2 by RT PCR: NEGATIVE

## 2021-12-26 SURGERY — FIXATION, FRACTURE, INTERTROCHANTERIC, WITH INTRAMEDULLARY ROD
Anesthesia: General | Site: Hip | Laterality: Left

## 2021-12-26 MED ORDER — KETOROLAC TROMETHAMINE 30 MG/ML IJ SOLN
INTRAMUSCULAR | Status: DC | PRN
  Administered 2021-12-26: 15 mg via INTRAVENOUS

## 2021-12-26 MED ORDER — FENTANYL CITRATE (PF) 100 MCG/2ML IJ SOLN: 25.0000 ug | INTRAMUSCULAR | Status: DC | PRN

## 2021-12-26 MED ORDER — ONDANSETRON HCL 4 MG/2ML IJ SOLN: 4.0000 mg | Freq: Four times a day (QID) | INTRAMUSCULAR | Status: DC | PRN

## 2021-12-26 MED ORDER — ONDANSETRON HCL 4 MG/2ML IJ SOLN
INTRAMUSCULAR | Status: AC
  Filled 2021-12-26: qty 2

## 2021-12-26 MED ORDER — METOCLOPRAMIDE HCL 5 MG/ML IJ SOLN: 5.0000 mg | Freq: Three times a day (TID) | INTRAMUSCULAR | Status: DC | PRN

## 2021-12-26 MED ORDER — ROCURONIUM BROMIDE 100 MG/10ML IV SOLN
INTRAVENOUS | Status: DC | PRN
  Administered 2021-12-26: 30 mg via INTRAVENOUS
  Administered 2021-12-26 (×2): 50 mg via INTRAVENOUS
  Administered 2021-12-26: 20 mg via INTRAVENOUS

## 2021-12-26 MED ORDER — DEXAMETHASONE SODIUM PHOSPHATE 10 MG/ML IJ SOLN
INTRAMUSCULAR | Status: DC | PRN
Start: 2021-12-26 — End: 2021-12-26
  Administered 2021-12-26: 5 mg via INTRAVENOUS

## 2021-12-26 MED ORDER — SUGAMMADEX SODIUM 200 MG/2ML IV SOLN
INTRAVENOUS | Status: DC | PRN
  Administered 2021-12-26: 295.2 mg via INTRAVENOUS

## 2021-12-26 MED ORDER — TRANEXAMIC ACID-NACL 1000-0.7 MG/100ML-% IV SOLN
INTRAVENOUS | Status: DC | PRN
  Administered 2021-12-26: 1000 mg via INTRAVENOUS

## 2021-12-26 MED ORDER — ENOXAPARIN SODIUM 40 MG/0.4ML IJ SOSY: 40.0000 mg | PREFILLED_SYRINGE | INTRAMUSCULAR | Status: DC

## 2021-12-26 MED ORDER — METOCLOPRAMIDE HCL 5 MG PO TABS
5.0000 mg | ORAL_TABLET | Freq: Three times a day (TID) | ORAL | Status: DC | PRN
  Filled 2021-12-26: qty 2

## 2021-12-26 MED ORDER — FENTANYL CITRATE (PF) 100 MCG/2ML IJ SOLN
INTRAMUSCULAR | Status: AC
  Filled 2021-12-26: qty 2

## 2021-12-26 MED ORDER — HYDROCODONE-ACETAMINOPHEN 5-325 MG PO TABS: 1.0000 | ORAL_TABLET | ORAL | Status: DC | PRN

## 2021-12-26 MED ORDER — ONDANSETRON HCL 4 MG/2ML IJ SOLN: 4.0000 mg | Freq: Once | INTRAMUSCULAR | Status: DC | PRN

## 2021-12-26 MED ORDER — OXYCODONE HCL 5 MG/5ML PO SOLN: 5.0000 mg | Freq: Once | ORAL | Status: DC | PRN

## 2021-12-26 MED ORDER — LIDOCAINE HCL (CARDIAC) PF 100 MG/5ML IV SOSY
PREFILLED_SYRINGE | INTRAVENOUS | Status: DC | PRN
  Administered 2021-12-26: 80 mg via INTRAVENOUS

## 2021-12-26 MED ORDER — ROCURONIUM BROMIDE 10 MG/ML (PF) SYRINGE
PREFILLED_SYRINGE | INTRAVENOUS | Status: AC
  Filled 2021-12-26: qty 10

## 2021-12-26 MED ORDER — PHENYLEPHRINE HCL (PRESSORS) 10 MG/ML IV SOLN
INTRAVENOUS | Status: DC | PRN
  Administered 2021-12-26: 240 ug via INTRAVENOUS
  Administered 2021-12-26: 160 ug via INTRAVENOUS
  Administered 2021-12-26: 80 ug via INTRAVENOUS
  Administered 2021-12-26: 240 ug via INTRAVENOUS
  Administered 2021-12-26: 160 ug via INTRAVENOUS

## 2021-12-26 MED ORDER — MENTHOL 3 MG MT LOZG: 1.0000 | LOZENGE | OROMUCOSAL | Status: DC | PRN

## 2021-12-26 MED ORDER — PHENYLEPHRINE HCL-NACL 20-0.9 MG/250ML-% IV SOLN
INTRAVENOUS | Status: DC | PRN
  Administered 2021-12-26: 25 ug/min via INTRAVENOUS

## 2021-12-26 MED ORDER — CHLORHEXIDINE GLUCONATE CLOTH 2 % EX PADS
6.0000 | MEDICATED_PAD | Freq: Every day | CUTANEOUS | Status: DC
  Administered 2021-12-26 – 2021-12-29 (×4): 6 via TOPICAL

## 2021-12-26 MED ORDER — LACTATED RINGERS IV SOLN: INTRAVENOUS | Status: DC | PRN

## 2021-12-26 MED ORDER — TRANEXAMIC ACID-NACL 1000-0.7 MG/100ML-% IV SOLN
INTRAVENOUS | Status: AC
  Administered 2021-12-26: 1000 mg via INTRAVENOUS
  Filled 2021-12-26: qty 100

## 2021-12-26 MED ORDER — ONDANSETRON HCL 4 MG PO TABS: 4.0000 mg | ORAL_TABLET | Freq: Four times a day (QID) | ORAL | Status: DC | PRN

## 2021-12-26 MED ORDER — LIDOCAINE HCL 1 % IJ SOLN
INTRAMUSCULAR | Status: DC | PRN
  Administered 2021-12-26: 30 mL

## 2021-12-26 MED ORDER — ACETAMINOPHEN 325 MG PO TABS: 325.0000 mg | ORAL_TABLET | Freq: Four times a day (QID) | ORAL | Status: DC | PRN

## 2021-12-26 MED ORDER — ONDANSETRON HCL 4 MG/2ML IJ SOLN
INTRAMUSCULAR | Status: DC | PRN
  Administered 2021-12-26: 4 mg via INTRAVENOUS

## 2021-12-26 MED ORDER — PHENYLEPHRINE HCL (PRESSORS) 10 MG/ML IV SOLN
INTRAVENOUS | Status: AC
  Filled 2021-12-26: qty 1

## 2021-12-26 MED ORDER — PROPOFOL 500 MG/50ML IV EMUL
INTRAVENOUS | Status: AC
Start: 2021-12-26 — End: ?
  Filled 2021-12-26: qty 50

## 2021-12-26 MED ORDER — FENTANYL CITRATE (PF) 100 MCG/2ML IJ SOLN
INTRAMUSCULAR | Status: DC | PRN
  Administered 2021-12-26 (×8): 25 ug via INTRAVENOUS

## 2021-12-26 MED ORDER — BUPIVACAINE HCL (PF) 0.5 % IJ SOLN
INTRAMUSCULAR | Status: AC
  Filled 2021-12-26: qty 10

## 2021-12-26 MED ORDER — TRANEXAMIC ACID-NACL 1000-0.7 MG/100ML-% IV SOLN: 1000.0000 mg | Freq: Once | INTRAVENOUS | Status: AC

## 2021-12-26 MED ORDER — MORPHINE SULFATE (PF) 2 MG/ML IV SOLN: 0.5000 mg | INTRAVENOUS | Status: DC | PRN

## 2021-12-26 MED ORDER — KETOROLAC TROMETHAMINE 30 MG/ML IJ SOLN
INTRAMUSCULAR | Status: AC
  Filled 2021-12-26: qty 1

## 2021-12-26 MED ORDER — ALUM & MAG HYDROXIDE-SIMETH 200-200-20 MG/5ML PO SUSP
30.0000 mL | ORAL | Status: DC | PRN
  Administered 2021-12-26: 30 mL via ORAL
  Filled 2021-12-26: qty 30

## 2021-12-26 MED ORDER — DEXAMETHASONE SODIUM PHOSPHATE 10 MG/ML IJ SOLN
INTRAMUSCULAR | Status: AC
  Filled 2021-12-26: qty 1

## 2021-12-26 MED ORDER — ADULT MULTIVITAMIN W/MINERALS CH
1.0000 | ORAL_TABLET | Freq: Every day | ORAL | Status: DC
  Administered 2021-12-26 – 2021-12-31 (×6): 1 via ORAL
  Filled 2021-12-26 (×7): qty 1

## 2021-12-26 MED ORDER — ACETAMINOPHEN 10 MG/ML IV SOLN
INTRAVENOUS | Status: AC
  Filled 2021-12-26: qty 100

## 2021-12-26 MED ORDER — SODIUM CHLORIDE 1 G PO TABS
1.0000 g | ORAL_TABLET | Freq: Two times a day (BID) | ORAL | Status: DC
  Administered 2021-12-26 – 2021-12-31 (×10): 1 g via ORAL
  Filled 2021-12-26 (×11): qty 1

## 2021-12-26 MED ORDER — OXYCODONE HCL 5 MG PO TABS: 5.0000 mg | ORAL_TABLET | Freq: Once | ORAL | Status: DC | PRN

## 2021-12-26 MED ORDER — CEFAZOLIN SODIUM-DEXTROSE 2-4 GM/100ML-% IV SOLN
2.0000 g | Freq: Three times a day (TID) | INTRAVENOUS | Status: AC
  Administered 2021-12-26 – 2021-12-27 (×3): 2 g via INTRAVENOUS
  Filled 2021-12-26 (×3): qty 100

## 2021-12-26 MED ORDER — FENTANYL CITRATE (PF) 100 MCG/2ML IJ SOLN
INTRAMUSCULAR | Status: AC
Start: 2021-12-26 — End: ?
  Filled 2021-12-26: qty 2

## 2021-12-26 MED ORDER — PHENYLEPHRINE 80 MCG/ML (10ML) SYRINGE FOR IV PUSH (FOR BLOOD PRESSURE SUPPORT)
PREFILLED_SYRINGE | INTRAVENOUS | Status: AC
  Filled 2021-12-26: qty 10

## 2021-12-26 MED ORDER — BUPIVACAINE-EPINEPHRINE 0.5% -1:200000 IJ SOLN
INTRAMUSCULAR | Status: DC | PRN
  Administered 2021-12-26: 30 mL

## 2021-12-26 MED ORDER — PROPOFOL 10 MG/ML IV BOLUS
INTRAVENOUS | Status: DC | PRN
  Administered 2021-12-26 (×3): 50 mg via INTRAVENOUS

## 2021-12-26 MED ORDER — DOCUSATE SODIUM 100 MG PO CAPS
100.0000 mg | ORAL_CAPSULE | Freq: Two times a day (BID) | ORAL | Status: DC
  Administered 2021-12-26 – 2021-12-31 (×10): 100 mg via ORAL
  Filled 2021-12-26 (×10): qty 1

## 2021-12-26 MED ORDER — ACETAMINOPHEN 500 MG PO TABS: 500.0000 mg | ORAL_TABLET | Freq: Four times a day (QID) | ORAL | Status: DC

## 2021-12-26 MED ORDER — SODIUM CHLORIDE 0.9 % IR SOLN: Status: DC | PRN

## 2021-12-26 MED ORDER — HYDROCODONE-ACETAMINOPHEN 5-325 MG PO TABS: 1.0000 | ORAL_TABLET | ORAL | 0 refills | Status: DC | PRN

## 2021-12-26 MED ORDER — ENSURE SURGERY PO LIQD
237.0000 mL | Freq: Two times a day (BID) | ORAL | Status: DC
  Administered 2021-12-26 – 2021-12-29 (×5): 237 mL via ORAL
  Filled 2021-12-26 (×2): qty 237

## 2021-12-26 MED ORDER — ACETAMINOPHEN 10 MG/ML IV SOLN
INTRAVENOUS | Status: DC | PRN
  Administered 2021-12-26: 1000 mg via INTRAVENOUS

## 2021-12-26 MED ORDER — PHENOL 1.4 % MT LIQD: 1.0000 | OROMUCOSAL | Status: DC | PRN

## 2021-12-26 MED ORDER — ACETAMINOPHEN 10 MG/ML IV SOLN: 1000.0000 mg | Freq: Once | INTRAVENOUS | Status: DC | PRN

## 2021-12-26 MED ORDER — 0.9 % SODIUM CHLORIDE (POUR BTL) OPTIME
TOPICAL | Status: DC | PRN
  Administered 2021-12-26: 1000 mL

## 2021-12-26 MED ORDER — HYDROCODONE-ACETAMINOPHEN 7.5-325 MG PO TABS: 1.0000 | ORAL_TABLET | ORAL | Status: DC | PRN

## 2021-12-26 SURGICAL SUPPLY — 51 items
BIT DRILL SHORT 4.0 (BIT) IMPLANT
BNDG COHESIVE 6X5 TAN ST LF (GAUZE/BANDAGES/DRESSINGS) ×6 IMPLANT
CHLORAPREP W/TINT 26 (MISCELLANEOUS) ×2 IMPLANT
DRAPE 3/4 80X56 (DRAPES) ×4 IMPLANT
DRAPE C-ARM 42X72 X-RAY (DRAPES) ×2 IMPLANT
DRAPE SURG 17X11 SM STRL (DRAPES) ×4 IMPLANT
DRAPE U-SHAPE 47X51 STRL (DRAPES) ×2 IMPLANT
DRILL BIT SHORT 4.0 (BIT) ×2
DRSG OPSITE POSTOP 3X4 (GAUZE/BANDAGES/DRESSINGS) ×3 IMPLANT
DRSG OPSITE POSTOP 4X14 (GAUZE/BANDAGES/DRESSINGS) IMPLANT
DRSG OPSITE POSTOP 4X6 (GAUZE/BANDAGES/DRESSINGS) ×2 IMPLANT
ELECT BLADE 6.5 EXT (BLADE) ×1 IMPLANT
ELECT REM PT RETURN 9FT ADLT (ELECTROSURGICAL) ×2
ELECTRODE REM PT RTRN 9FT ADLT (ELECTROSURGICAL) ×1 IMPLANT
GAUZE SPONGE 4X4 12PLY STRL (GAUZE/BANDAGES/DRESSINGS) ×2 IMPLANT
GAUZE XEROFORM 1X8 LF (GAUZE/BANDAGES/DRESSINGS) ×3 IMPLANT
GLOVE SURG ENC MOIS LTX SZ7.5 (GLOVE) ×2 IMPLANT
GLOVE SURG UNDER POLY LF SZ7.5 (GLOVE) ×2 IMPLANT
GOWN L4 XLG 20 PK N/S (GOWN DISPOSABLE) ×2 IMPLANT
GOWN STRL REUS W/ TWL XL LVL3 (GOWN DISPOSABLE) ×1 IMPLANT
GOWN STRL REUS W/TWL XL LVL3 (GOWN DISPOSABLE) ×1
GUIDE PIN 3.2X343 (PIN) ×2
GUIDE PIN 3.2X343MM (PIN) ×2
HEMOVAC 400CC 10FR (MISCELLANEOUS) IMPLANT
HOLDER FOLEY CATH W/STRAP (MISCELLANEOUS) ×2 IMPLANT
KIT TURNOVER CYSTO (KITS) ×2 IMPLANT
MANIFOLD NEPTUNE II (INSTRUMENTS) ×2 IMPLANT
MAT ABSORB  FLUID 56X50 GRAY (MISCELLANEOUS) ×1
MAT ABSORB FLUID 56X50 GRAY (MISCELLANEOUS) ×1 IMPLANT
NAIL TRIGEN LEFT 11.5X38-125 (Nail) ×1 IMPLANT
NS IRRIG 1000ML POUR BTL (IV SOLUTION) ×2 IMPLANT
PACK HIP COMPR (MISCELLANEOUS) ×2 IMPLANT
PAD ABD DERMACEA PRESS 5X9 (GAUZE/BANDAGES/DRESSINGS) ×2 IMPLANT
PAD ARMBOARD 7.5X6 YLW CONV (MISCELLANEOUS) ×2 IMPLANT
PIN GUIDE 3.2X343MM (PIN) IMPLANT
REAMER ROD DEEP FLUTE 2.5X950 (INSTRUMENTS) ×1 IMPLANT
SCREW LAG COMPR KIT 90/85 (Screw) ×1 IMPLANT
SCREW TRIGEN LOW PROF 5.0X37.5 (Screw) ×1 IMPLANT
SCREW TRIGEN LOW PROF 5.0X40 (Screw) ×1 IMPLANT
STAPLER SKIN PROX 35W (STAPLE) ×2 IMPLANT
SUCTION FRAZIER HANDLE 10FR (MISCELLANEOUS) ×1
SUCTION TUBE FRAZIER 10FR DISP (MISCELLANEOUS) ×1 IMPLANT
SUT VIC AB 0 CT1 36 (SUTURE) ×4 IMPLANT
SUT VIC AB 2-0 CT1 (SUTURE) ×2 IMPLANT
SUT VIC AB 2-0 CT1 27 (SUTURE) ×1
SUT VIC AB 2-0 CT1 TAPERPNT 27 (SUTURE) ×1 IMPLANT
SUT VICRYL 0 AB UR-6 (SUTURE) ×2 IMPLANT
SYR 30ML LL (SYRINGE) ×2 IMPLANT
TAPE PAPER 1/2X10 TAN MEDIPORE (MISCELLANEOUS) ×2 IMPLANT
TRAY FOLEY SLVR 16FR LF STAT (SET/KITS/TRAYS/PACK) ×2 IMPLANT
WATER STERILE IRR 1000ML POUR (IV SOLUTION) ×2 IMPLANT

## 2021-12-26 NOTE — Transfer of Care (Signed)
Immediate Anesthesia Transfer of Care Note ? ?Patient: LEEANDRA ELLERSON ? ?Procedure(s) Performed: INTRAMEDULLARY (IM) NAIL INTERTROCHANTRIC (Left: Hip) ? ?Patient Location: PACU ? ?Anesthesia Type:General ? ?Level of Consciousness: drowsy ? ?Airway & Oxygen Therapy: Patient Spontanous Breathing and Patient connected to face mask oxygen ? ?Post-op Assessment: Report given to RN and Post -op Vital signs reviewed and stable ? ?Post vital signs: Reviewed and stable ? ?Last Vitals:  ?Vitals Value Taken Time  ?BP 144/122 12/26/21 1439  ?Temp 98.59f  ?Pulse 106 12/26/21 1440  ?Resp 18 12/26/21 1440  ?SpO2 98 % 12/26/21 1440  ?Vitals shown include unvalidated device data. ? ?Last Pain:  ?Vitals:  ? 12/26/21 0852  ?TempSrc:   ?PainSc: 10-Worst pain ever  ?   ? ?  ? ?Complications: No notable events documented. ?

## 2021-12-26 NOTE — Consult Note (Signed)
?ORTHOPAEDIC CONSULTATION ? ?REQUESTING PHYSICIAN: Sharen Hones, MD ? ?Chief Complaint: Left hip pain ? ?HPI: ?Melanie Carrillo is a 87 y.o. female who complains of left hip pain after mechanical fall at home. The pain is sharp in character. The pain is worse with movement and better with rest. Denies any numbness, tingling or constitutional symptoms. ? ?X-rays in the ER showed a comminuted proximal femur fracture.  Patient was admitted to the hospitalist and orthopedics was consulted regarding operative management.  Patient lives alone, does not use any assistive ambulatory device and is not on any anticoagulation. ? ? ?History reviewed. No pertinent past medical history. ?History reviewed. No pertinent surgical history. ?Social History  ? ?Socioeconomic History  ? Marital status: Widowed  ?  Spouse name: Not on file  ? Number of children: Not on file  ? Years of education: Not on file  ? Highest education level: Not on file  ?Occupational History  ? Not on file  ?Tobacco Use  ? Smoking status: Never  ? Smokeless tobacco: Never  ?Vaping Use  ? Vaping Use: Never used  ?Substance and Sexual Activity  ? Alcohol use: Never  ? Drug use: Never  ? Sexual activity: Not Currently  ?Other Topics Concern  ? Not on file  ?Social History Narrative  ? Not on file  ? ?Social Determinants of Health  ? ?Financial Resource Strain: Not on file  ?Food Insecurity: Not on file  ?Transportation Needs: Not on file  ?Physical Activity: Not on file  ?Stress: Not on file  ?Social Connections: Not on file  ? ?History reviewed. No pertinent family history. ?No Known Allergies ?Prior to Admission medications   ?Medication Sig Start Date End Date Taking? Authorizing Provider  ?amLODipine (NORVASC) 10 MG tablet Take 10 mg by mouth daily. 12/03/21  Yes [provider]  ?brimonidine (ALPHAGAN) 0.2 % ophthalmic solution 1 drop 2 (two) times daily. 12/15/21  Yes [provider]  ?Calcium Carb-Cholecalciferol 600-10 MG-MCG TABS Take 1  tablet by mouth daily.   Yes [provider]  ?latanoprost (XALATAN) 0.005 % ophthalmic solution 1 drop at bedtime. 11/13/21  Yes [provider]  ?losartan (COZAAR) 100 MG tablet Take 100 mg by mouth daily. 12/03/21  Yes [provider]  ?lovastatin (MEVACOR) 40 MG tablet Take 1 tablet by mouth daily. 11/10/21  Yes [provider]  ?montelukast (SINGULAIR) 10 MG tablet Take 10 mg by mouth at bedtime. 11/10/21  Yes [provider]  ?omeprazole (PRILOSEC) 40 MG capsule Take 40 mg by mouth daily. 10/05/21  Yes [provider]  ?sertraline (ZOLOFT) 25 MG tablet Take 25 mg by mouth daily. 08/03/21  Yes [provider]  ? ?DG Chest 1 View ? ?Result Date: 12/25/2021 ?CLINICAL DATA:  Preop fall EXAM: CHEST  1 VIEW COMPARISON:  None Available. FINDINGS: Low lung volumes. Mild diffuse reticular opacity suggests chronic lung disease. No acute airspace disease, pleural effusion or pneumothorax. Borderline cardiac size. Aortic atherosclerosis IMPRESSION: No active disease. Low lung volumes. Mild diffuse reticular opacity suggestive of chronic lung disease Electronically Signed   By: Donavan Foil M.D.   On: 12/25/2021 15:15  ? ?CT Head Wo Contrast ? ?Result Date: 12/25/2021 ?CLINICAL DATA:  Head trauma, minor (Age >= 65y); Neck trauma (Age >= 65y) EXAM: CT HEAD WITHOUT CONTRAST CT CERVICAL SPINE WITHOUT CONTRAST TECHNIQUE: Multidetector CT imaging of the head and cervical spine was performed following the standard protocol without intravenous contrast. Multiplanar CT image reconstructions of the cervical spine  were also generated. RADIATION DOSE REDUCTION: This exam was performed according to the departmental dose-optimization program which includes automated exposure control, adjustment of the mA and/or kV according to patient size and/or use of iterative reconstruction technique. COMPARISON:  None Available. FINDINGS: CT HEAD FINDINGS Brain: No evidence of acute  intracranial hemorrhage or extra-axial collection.No evidence of mass lesion/concerning mass effect.The ventricles are normal in size.Scattered subcortical and periventricular white matter hypodensities, nonspecific but likely sequela of chronic small vessel ischemic disease.Mild cerebral atrophy Vascular: No hyperdense vessel. Skull: Negative for skull fracture. Sinuses/Orbits: There is paranasal sinus disease, with partially visualized opacification of the right maxillary sinus and mild ethmoid and sphenoid sinus mucosal thickening. Mastoid air cells are clear. The orbits are unremarkable. Other: None. CT CERVICAL SPINE FINDINGS Alignment: Normal. Skull base and vertebrae: Unchanged mild central compression deformity of T3. There is no acute cervical spine fracture. Soft tissues and spinal canal: No prevertebral fluid or swelling. No visible canal hematoma. Disc levels: There is mild multilevel degenerative disc disease with trace degenerative anterolisthesis at C5-C6. There is moderate to severe multilevel facet arthropathy with fusion of the C2-C3 facets. Upper chest: Negative. Other: There is a 3.0 cm heterogeneous left thyroid nodule. IMPRESSION: No acute intracranial abnormality. Sequela of chronic small vessel ischemic disease. No acute cervical spine fracture. Multilevel degenerative disc disease and facet arthropathy. Heterogeneous 3.0 cm left thyroid nodule with intrathoracic extension. In the setting of significant comorbidities or limited life expectancy, no follow-up recommended. Otherwise, would recommend thyroid ultrasound if not previously evaluated. (Ref: J Am Coll Radiol. 2015 Feb;12(2): 143-50). Electronically Signed   By: Maurine Simmering M.D.   On: 12/25/2021 15:11  ? ?CT Cervical Spine Wo Contrast ? ?Result Date: 12/25/2021 ?CLINICAL DATA:  Head trauma, minor (Age >= 65y); Neck trauma (Age >= 65y) EXAM: CT HEAD WITHOUT CONTRAST CT CERVICAL SPINE WITHOUT CONTRAST TECHNIQUE: Multidetector CT imaging of  the head and cervical spine was performed following the standard protocol without intravenous contrast. Multiplanar CT image reconstructions of the cervical spine were also generated. RADIATION DOSE REDUCTION: This exam was performed according to the departmental dose-optimization program which includes automated exposure control, adjustment of the mA and/or kV according to patient size and/or use of iterative reconstruction technique. COMPARISON:  None Available. FINDINGS: CT HEAD FINDINGS Brain: No evidence of acute intracranial hemorrhage or extra-axial collection.No evidence of mass lesion/concerning mass effect.The ventricles are normal in size.Scattered subcortical and periventricular white matter hypodensities, nonspecific but likely sequela of chronic small vessel ischemic disease.Mild cerebral atrophy Vascular: No hyperdense vessel. Skull: Negative for skull fracture. Sinuses/Orbits: There is paranasal sinus disease, with partially visualized opacification of the right maxillary sinus and mild ethmoid and sphenoid sinus mucosal thickening. Mastoid air cells are clear. The orbits are unremarkable. Other: None. CT CERVICAL SPINE FINDINGS Alignment: Normal. Skull base and vertebrae: Unchanged mild central compression deformity of T3. There is no acute cervical spine fracture. Soft tissues and spinal canal: No prevertebral fluid or swelling. No visible canal hematoma. Disc levels: There is mild multilevel degenerative disc disease with trace degenerative anterolisthesis at C5-C6. There is moderate to severe multilevel facet arthropathy with fusion of the C2-C3 facets. Upper chest: Negative. Other: There is a 3.0 cm heterogeneous left thyroid nodule. IMPRESSION: No acute intracranial abnormality. Sequela of chronic small vessel ischemic disease. No acute cervical spine fracture. Multilevel degenerative disc disease and facet arthropathy. Heterogeneous 3.0 cm left thyroid nodule with intrathoracic extension. In  the setting of significant comorbidities or limited life expectancy, no  follow-up recommended. Otherwise, would recommend thyroid ultrasound if not previously evaluated. (Ref: J Am Coll Radiol. 2015 Feb;12(2): 1

## 2021-12-26 NOTE — Plan of Care (Signed)

## 2021-12-26 NOTE — Plan of Care (Signed)

## 2021-12-26 NOTE — Progress Notes (Signed)
Initial Nutrition Assessment ?RD working remotely. ? ?DOCUMENTATION CODES:  ? ?Not applicable ? ?INTERVENTION:  ?- will order Ensure Surgery BID, each supplement provides 330 kcal and 18 grams of protein. ?- will order 1 tablet multivitamin with minerals/day. ?- complete NFPE when feasible.  ? ? ?NUTRITION DIAGNOSIS:  ? ?Increased nutrient needs related to hip fracture, post-op healing as evidenced by estimated needs. ? ?GOAL:  ? ?Patient will meet greater than or equal to 90% of their needs ? ?MONITOR:  ? ?Diet advancement, PO intake, Labs, Weight trends, Skin ? ?REASON FOR ASSESSMENT:  ? ?Consult ?Hip fracture protocol ? ?ASSESSMENT:  ? ?87 y.o. female with medical history of interstitial lung disease. She presented to the ED after a fall at home and was subsequently found to have L hip fracture. ? ?Patient is NPO for L hip surgery today. She is noted to be hyponatremic with supplementation ordered and MD note indicates dx of rhabdomyolysis this admission.  ? ?She has not been seen by a [place] RD at any time in the past. ? ?Weight yesterday was 163 lb and no other weight recording available in the chart.  ? ? ?Labs reviewed; Na: 129 mmol/l, BUN: 27 mg/dl, Ca: 8.6 mg/dl. ?Medications reviewed; 1 g NaCl BID. ?IVF; NS @ 75 ml/hr.  ?  ? ?NUTRITION - FOCUSED PHYSICAL EXAM: ? ?RD working remotely.  ? ?Diet Order:   ?Diet Order   ? ?       ?  Diet NPO time specified  Diet effective now       ?  ? ?  ?  ? ?  ? ? ?EDUCATION NEEDS:  ? ?No education needs have been identified at this time ? ?Skin:  Skin Assessment: Skin Integrity Issues: ?Skin Integrity Issues:: Incisions ?Incisions: L hip (5/6) ? ?Last BM:  PTA/unknown ? ?Height:  ? ?Ht Readings from Last 1 Encounters:  ?12/25/21 '5\' 4"'$  (1.626 m)  ? ? ?Weight:  ? ?Wt Readings from Last 1 Encounters:  ?12/25/21 73.8 kg  ? ? ? ?BMI:  Body mass index is 27.93 kg/m?. ? ?Estimated Nutritional Needs:  ?Kcal:  1600-1800 kcal ?Protein:  75-85 grams ?Fluid:  >/= 1.7  L/day ? ? ? ? ? ?Jarome Matin, MS, RD, LDN ?Registered Dietitian II ?Inpatient Clinical Nutrition ?RD pager # and on-call/weekend pager # available in [place]  ? ?

## 2021-12-26 NOTE — Anesthesia Preprocedure Evaluation (Addendum)
Anesthesia Evaluation  ?Patient identified by MRN, date of birth, ID band ?Patient awake ? ? ? ?Reviewed: ?Allergy & Precautions, NPO status , Patient's Chart, lab work & pertinent test results ? ?History of Anesthesia Complications ?Negative for: history of anesthetic complications ? ?Airway ?Mallampati: III ? ?TM Distance: >3 FB ?Neck ROM: Full ? ? ? Dental ? ?(+) Edentulous Upper, Edentulous Lower ?  ?Pulmonary ?shortness of breath and with exertion, sleep apnea , neg COPD, Patient abstained from smoking.Not current smoker,  ?Listed hx of interstitial lung disease but patient denies ?  ?Pulmonary exam normal ?breath sounds clear to auscultation ? ? ? ? ? ? Cardiovascular ?Exercise Tolerance: Poor ?METS: 3 - Mets hypertension, Pt. on medications ?(-) CAD and (-) Past MI (-) dysrhythmias  ?Rhythm:Regular Rate:Normal ?- Systolic murmurs ?INTERPRETATION ?NORMAL LEFT VENTRICULAR SYSTOLIC FUNCTION WITH MILD LVH ?NORMAL RIGHT VENTRICULAR SYSTOLIC FUNCTION ?NO VALVULAR STENOSIS ?MILD AR, TR, PR ?TRIVIAL MR ?EF >55% ?_________________________________________________________________________________________ ? ?  ?Neuro/Psych ? ?rhabdo 2/2 to being found on the floor after her fall ? Neuromuscular disease negative psych ROS  ? GI/Hepatic ?GERD  Medicated and Controlled,(+)  ?  ? (-) substance abuse ? ,   ?Endo/Other  ?neg diabetes ? Renal/GU ?negative Renal ROS  ? ?  ?Musculoskeletal ? ? Abdominal ?  ?Peds ? Hematology ? ?(+) Blood dyscrasia, anemia ,   ?Anesthesia Other Findings ? ? Reproductive/Obstetrics ? ?  ? ? ? ? ? ? ? ? ? ? ? ? ? ?  ?  ? ? ? ? ? ? ? ? ?Anesthesia Physical ?Anesthesia Plan ? ?ASA: 3 ? ?Anesthesia Plan: Spinal  ? ?Post-op Pain Management: Ofirmev IV (intra-op)*  ? ?Induction: Intravenous ? ?PONV Risk Score and Plan: 2 and Ondansetron, Dexamethasone, Propofol infusion, TIVA and Treatment may vary due to age or medical condition ? ?Airway Management Planned: Natural  Airway ? ?Additional Equipment: None ? ?Intra-op Plan:  ? ?Post-operative Plan:  ? ?Informed Consent: I have reviewed the patients History and Physical, chart, labs and discussed the procedure including the risks, benefits and alternatives for the proposed anesthesia with the patient or authorized representative who has indicated his/her understanding and acceptance.  ? ? ? ? ? ?Plan Discussed with: CRNA and Surgeon ? ?Anesthesia Plan Comments: (ADDENDUM: Dr Sharlet Salina requests for general anesthetic rather than spinal due to concerns about wanting complete muscle relaxation, despite our suggestion that a spinal provides this. Will proceed with GETA. ? ?Discussed R/B/A of neuraxial anesthesia technique with patient: ?- rare risks of spinal/epidural hematoma, nerve damage, infection ?- Risk of PDPH ?- Risk of nausea and vomiting ?- Risk of conversion to general anesthesia and its associated risks, including sore throat, damage to lips/eyes/teeth/oropharynx, and rare risks such as cardiac and respiratory events. ?- Risk of allergic reactions ? ?Discussed the role of CRNA in patient's perioperative care. ? ?Patient voiced understanding.)  ? ? ? ? ? ?Anesthesia Quick Evaluation ? ?

## 2021-12-26 NOTE — Progress Notes (Signed)
?  Progress Note ? ? ?Patient: Melanie Carrillo WHQ:[id number] DOB: 07-Jan-1935 DOA: 12/25/2021     1 ?DOS: the patient was seen and examined on 12/26/2021 ?  ?Brief hospital course: ?Melanie Carrillo is a 87 y.o. female with medical history significant of interstitial lung disease who present to the hospital with hip fracture. ?Patient currently lives alone at home, she has interstitial lung disease, she has some short of breath with exertion and a cough.  But no hypoxia.  She went to the bathroom last night, tripped and fell on the ground.  She sustained a fracture in her left hip.  ? ?Assessment and Plan: ?* Hip fracture ([place]) ?Surgery today. ? ?Elevated troponin ?Secondary to trauma and rhabdomyolysis.  No angina. ? ?Hyponatremia ?Sodium level still 129 after giving fluids, daughter states that patient may have chronic hyponatremia.  We will start salt tablets 1 g twice a day. ? ?Rhabdomyolysis ?Condition improving, will continue fluids for now.  Recheck CK level tomorrow. ? ?Interstitial lung disease ([place]) ?Condition stable, continue as needed DuoNeb. ? ? ? ? ?  ? ?Subjective: Patient doing well.  Denies any short of breath, mild cough nonproductive.  No pain ? ?Physical Exam: ?Vitals:  ? 12/25/21 1949 12/25/21 2101 12/26/21 0319 12/26/21 6599  ?BP: (!) 146/68 (!) 143/66 136/68 112/82  ?Pulse: (!) 112 82 99 82  ?Resp: '20 16 16 18  '$ ?Temp: 98.2 ?F (36.8 ?C) 98.1 ?F (36.7 ?C) 98.1 ?F (36.7 ?C) 98.4 ?F (36.9 ?C)  ?TempSrc: Oral Oral Oral Oral  ?SpO2: 95% 97% 96% 96%  ?Weight:      ?Height:      ? ?General exam: Appears calm and comfortable  ?Respiratory system: Clear to auscultation. Respiratory effort normal. ?Cardiovascular system: S1 & S2 heard, RRR. No JVD, murmurs, rubs, gallops or clicks. No pedal edema. ?Gastrointestinal system: Abdomen is nondistended, soft and nontender. No organomegaly or masses felt. Normal bowel sounds heard. ?Central nervous system: Alert and oriented. No focal neurological deficits. ?Extremities:  Symmetric 5 x 5 power. ?Skin: No rashes, lesions or ulcers ?Psychiatry: Judgement and insight appear normal. Mood & affect appropriate.  ? ?Data Reviewed: ? ?Lab results reviewed. ? ?Family Communication: Daughter updated at the bedside ? ?Disposition: ?Status is: Inpatient ?Remains inpatient appropriate because: Inpatient procedure ? Planned Discharge Destination:  pending  ? ? ? ?Time spent: 28 minutes ? ?Author: ?Sharen Hones, MD ?12/26/2021 11:50 AM ? ?For on call review [email or web address].  ?

## 2021-12-26 NOTE — Discharge Instructions (Addendum)
Patient may weight-bear as tolerated on the left lower extremity with the assistance of a walker. Patient should continue to receive physical therapy for hip and lower extremity range of motion, strengthening and gait training. She should elevate the left lower extremity whenever possible. She may apply ice to the surgical site to help reduce swelling. Dressing should be changed when necessary if drainage is observed. Continue DVT prophylaxis for 4-6 weeks postop. Patient will follow with orthopedic surgeon in his office in 14 days. Staples will be removed in the orthopedic office.  [facility], Dr. Sharlet Salina, [phone] ? ?

## 2021-12-26 NOTE — Hospital Course (Addendum)
Melanie Carrillo is a 87 y.o. female with medical history significant of interstitial lung disease who present to the hospital with hip fracture. ?Patient currently lives alone at home, she has interstitial lung disease, she has some short of breath with exertion and a cough.  But no hypoxia.  She went to the bathroom last night, tripped and fell on the ground.  She sustained a fracture in her left hip.  ?Left intramedullary nail performed on 5/6. ?Hemoglobin dropped down to 7.0, transfuse 1 unit PRBC ?

## 2021-12-26 NOTE — Op Note (Signed)
DATE OF SURGERY:  12/26/2021 ? ?TIME: 2:28 PM ? ?PATIENT NAME:  Melanie Carrillo ? ?AGE: 87 y.o. ? ?PRE-OPERATIVE DIAGNOSIS:  Left subtrochanteric proximal femur fracture ? ?POST-OPERATIVE DIAGNOSIS:  SAME ? ?PROCEDURE: Left INTRAMEDULLARY (IM) NAIL INTERTROCHANTRIC/subtrochanteric ? ?SURGEON:  Renee Harder ? ?EBL:  400 cc ? ?COMPLICATIONS: None ? ?OPERATIVE IMPLANTS: [employer] Intertan femoral nail 11.5 mm x 380 mm ? ?PREOPERATIVE INDICATIONS:  Melanie Carrillo is a 87 y.o. year old who fell and suffered a proximal femur fracture. She was brought into the ER and then admitted and optimized and then elected for surgical intervention.   ? ?The risks benefits and alternatives were discussed with the patient including but not limited to the risks of nonoperative treatment, versus surgical intervention including infection, bleeding, nerve injury, malunion, nonunion, hardware prominence, hardware failure, need for hardware removal, blood clots, cardiopulmonary complications, morbidity, mortality, among others, and they were willing to proceed.   ? ?OPERATIVE PROCEDURE: ? ?The patient was brought to the operating room and placed in the supine position.  General anesthesia was administered, with a foley. She was placed on the fracture table.  Closed reduction was performed under C-arm guidance. The length of the femur was also measured using fluoroscopy. Time out was then performed after sterile prep and drape. She received preoperative antibiotics. ? ?Incision was made along the lateral aspect of the femur.  Dissection was carried through IT band down to the fracture site.  Due to deforming forces, clamps, Cobb elevators were utilized for provisional reduction.  There was notable comminution posteriorly along the femur.  Once appropriate reduction was obtained and checked on AP and lateral imaging, incision was made proximal to the greater trochanter. A guidewire was placed in the appropriate position. Confirmation was  made on AP and lateral views. The above-named nail was opened. I opened the proximal femur with a reamer. I then sequentially reamed up to 13 mm and then placed the nail by hand easily down. ? ?Once the nail was completely seated, I placed a guidepin into the femoral head into the center center position through a second incision.  I measured the length, and then reamed the lateral cortex and up into the head. I then placed the two interlocking lag screws. Slight compression was applied. Anatomic fixation achieved. Bone quality was poor. ? ?I then placed 2 distal screws using a perfect circle technique. I then secured the proximal interlocking screws.  I then removed the instruments, and took final C-arm pictures AP and lateral the entire length of the leg. Anatomic reconstruction was achieved, and the wounds were irrigated copiously and closed with Vicryl  followed by staples and dry sterile dressing. Sponge and needle count were correct.   The patient was awakened and returned to PACU in stable and satisfactory condition. There no complications and the patient tolerated the procedure well. ? ? ?POSTOPERATIVE PLAN: ?She will be weightbearing as tolerated.   ?Ok to start DVT ppx POD#1 -Lovenox ?Dressing change by nursing staff as needed to keep dressing clean and dry ?Outpatient f/u in clinic in 2 weeks for staple removal and xrays ? ?Renee Harder ? ?

## 2021-12-26 NOTE — Anesthesia Procedure Notes (Signed)
Procedure Name: Intubation ?Date/Time: 12/26/2021 10:58 AM ?Performed by: Arita Miss, MD ?Pre-anesthesia Checklist: Patient identified, Patient being monitored, Timeout performed, Emergency Drugs available and Suction available ?Patient Re-evaluated:Patient Re-evaluated prior to induction ?Oxygen Delivery Method: Circle system utilized ?Preoxygenation: Pre-oxygenation with 100% oxygen ?Induction Type: IV induction ?Ventilation: Mask ventilation without difficulty and Oral airway inserted - appropriate to patient size ?Laryngoscope Size: 3 and McGraph ?Grade View: Grade I ?Tube type: Oral ?Tube size: 6.5 mm ?Number of attempts: 1 ?Airway Equipment and Method: Stylet ?Placement Confirmation: ETT inserted through vocal cords under direct vision, positive ETCO2 and breath sounds checked- equal and bilateral ?Secured at: 21 cm ?Tube secured with: Tape ?Dental Injury: Teeth and Oropharynx as per pre-operative assessment  ? ? ? ? ?

## 2021-12-27 ENCOUNTER — Other Ambulatory Visit: Payer: Self-pay

## 2021-12-27 DIAGNOSIS — D62 Acute posthemorrhagic anemia: Secondary | ICD-10-CM | POA: Diagnosis not present

## 2021-12-27 DIAGNOSIS — E871 Hypo-osmolality and hyponatremia: Secondary | ICD-10-CM | POA: Diagnosis not present

## 2021-12-27 DIAGNOSIS — J849 Interstitial pulmonary disease, unspecified: Secondary | ICD-10-CM | POA: Diagnosis not present

## 2021-12-27 DIAGNOSIS — S72002A Fracture of unspecified part of neck of left femur, initial encounter for closed fracture: Secondary | ICD-10-CM | POA: Diagnosis not present

## 2021-12-27 LAB — CBC
HCT: 22.6 % — ABNORMAL LOW (ref 36.0–46.0)
Hemoglobin: 7.5 g/dL — ABNORMAL LOW (ref 12.0–15.0)
MCH: 33 pg (ref 26.0–34.0)
MCHC: 33.2 g/dL (ref 30.0–36.0)
MCV: 99.6 fL (ref 80.0–100.0)
Platelets: 155 10*3/uL (ref 150–400)
RBC: 2.27 MIL/uL — ABNORMAL LOW (ref 3.87–5.11)
RDW: 12.1 % (ref 11.5–15.5)
WBC: 11.6 10*3/uL — ABNORMAL HIGH (ref 4.0–10.5)
nRBC: 0 % (ref 0.0–0.2)

## 2021-12-27 LAB — FERRITIN: Ferritin: 158 ng/mL (ref 11–307)

## 2021-12-27 LAB — CK: Total CK: 1087 U/L — ABNORMAL HIGH (ref 38–234)

## 2021-12-27 LAB — BASIC METABOLIC PANEL
Anion gap: 3 — ABNORMAL LOW (ref 5–15)
BUN: 19 mg/dL (ref 8–23)
CO2: 25 mmol/L (ref 22–32)
Calcium: 8.5 mg/dL — ABNORMAL LOW (ref 8.9–10.3)
Chloride: 102 mmol/L (ref 98–111)
Creatinine, Ser: 0.51 mg/dL (ref 0.44–1.00)
GFR, Estimated: >60 mL/min (ref >60)
Glucose, Bld: 121 mg/dL — ABNORMAL HIGH (ref 70–99)
Potassium: 4.5 mmol/L (ref 3.5–5.1)
Sodium: 130 mmol/L — ABNORMAL LOW (ref 135–145)

## 2021-12-27 LAB — IRON AND TIBC
Iron: 48 ug/dL (ref 28–170)
Saturation Ratios: 24 % (ref 10.4–31.8)
TIBC: 200 ug/dL — ABNORMAL LOW (ref 250–450)
UIBC: 152 ug/dL

## 2021-12-27 LAB — VITAMIN B12: Vitamin B-12: 299 pg/mL (ref 180–914)

## 2021-12-27 LAB — MAGNESIUM: Magnesium: 2.2 mg/dL (ref 1.7–2.4)

## 2021-12-27 LAB — HEMOGLOBIN: Hemoglobin: 9.1 g/dL — ABNORMAL LOW (ref 12.0–15.0)

## 2021-12-27 MED ORDER — SODIUM CHLORIDE 0.9 % IV SOLN: INTRAVENOUS | Status: DC

## 2021-12-27 NOTE — Evaluation (Signed)
Physical Therapy Evaluation ?Patient Details ?Name: Melanie Carrillo ?MRN: [id number] ?DOB: [birth year] ?Today's Date: 12/27/2021 ? ?History of Present Illness ? Pt is an 87 y.o. female with medical history significant of interstitial lung disease and HTN who presented to the hospital s/p fall with L hip fracture.   Pt now s/p L femur IM nail on 12/26/21. ?  ?Clinical Impression ? Pt was pleasant and motivated to participate during the session and put forth good effort throughout. Pt required only minimal assist with bed mobility and no physical assist with transfers or gait.  Pt was able to amb 1 x 10 feet and then after a short seated rest break was able to amb 1 x 20 feet with improved cadence and step length.  Pt's SpO2 and HR were both WNL on room air.  Pt stated will have 24/7 supervision at discharge from daughter who is a nurse as well as other family as needed.  Pt will benefit from HHPT upon discharge to safely address deficits listed in patient problem list for decreased caregiver assistance and eventual return to PLOF. ? ?   ?   ? ?Recommendations for follow up therapy are one component of a multi-disciplinary discharge planning process, led by the attending physician.  Recommendations may be updated based on patient status, additional functional criteria and insurance authorization. ? ?Follow Up Recommendations Home health PT ? ?  ?Assistance Recommended at Discharge Frequent or constant Supervision/Assistance  ?Patient can return home with the following ? A little help with walking and/or transfers;A little help with bathing/dressing/bathroom;Assistance with cooking/housework;Help with stairs or ramp for entrance;Assist for transportation ? ?  ?Equipment Recommendations Rolling walker (2 wheels)  ?Recommendations for Other Services ?    ?  ?Functional Status Assessment Patient has had a recent decline in their functional status and demonstrates the ability to make significant improvements in function in a  reasonable and predictable amount of time.  ? ?  ?Precautions / Restrictions Precautions ?Precautions: Fall ?Restrictions ?Weight Bearing Restrictions: Yes ?LLE Weight Bearing: Weight bearing as tolerated  ? ?  ? ?Mobility ? Bed Mobility ?Overal bed mobility: Needs Assistance ?Bed Mobility: Supine to Sit ?  ?  ?Supine to sit: Min assist ?  ?  ?General bed mobility comments: Min A for LLE and trunk control ?  ? ?Transfers ?Overall transfer level: Needs assistance ?Equipment used: Rolling walker (2 wheels) ?Transfers: Sit to/from Stand ?Sit to Stand: Min guard ?  ?  ?  ?  ?  ?General transfer comment: Good control and stability with min verbal cues for hand placement ?  ? ?Ambulation/Gait ?Ambulation/Gait assistance: Min guard ?Gait Distance (Feet): 20 Feet x 1, 10 Feet x 1 ?Assistive device: Rolling walker (2 wheels) ?Gait Pattern/deviations: Step-to pattern, Antalgic, Decreased step length - right, Decreased stance time - left ?Gait velocity: decreased ?  ?  ?General Gait Details: Slow cadence but steady without LOB or buckling ? ?Stairs ?  ?  ?  ?  ?  ? ?Wheelchair Mobility ?  ? ?Modified Rankin (Stroke Patients Only) ?  ? ?  ? ?Balance Overall balance assessment: Needs assistance ?  ?Sitting balance-Leahy Scale: Normal ?  ?  ?Standing balance support: Bilateral upper extremity supported, During functional activity ?Standing balance-Leahy Scale: Fair ?  ?  ?  ?  ?  ?  ?  ?  ?  ?  ?  ?  ?   ? ? ? ?Pertinent Vitals/Pain Pain Assessment ?Pain Assessment: 0-10 ?Pain Score: 5  ?  Pain Location: L hip ?Pain Descriptors / Indicators: Sore, Aching ?Pain Intervention(s): Repositioned, Premedicated before session, Monitored during session  ? ? ?Home Living Family/patient expects to be discharged to:: Private residence ?Living Arrangements: Alone ?Available Help at Discharge: Family;Available 24 hours/day ?Type of Home: House ?Home Access: Stairs to enter ?Entrance Stairs-Rails: None ?Entrance Stairs-Number of Steps: 1 ?  ?Home  Layout: One level ?Home Equipment: Wheelchair - manual ?   ?  ?Prior Function Prior Level of Function : Independent/Modified Independent ?  ?  ?  ?  ?  ?  ?Mobility Comments: Ind amb community distances without an AD, drives, no other fall history other than admitting fall ?ADLs Comments: Ind with ADLs ?  ? ? ?Hand Dominance  ?   ? ?  ?Extremity/Trunk Assessment  ? Upper Extremity Assessment ?Upper Extremity Assessment: Overall WFL for tasks assessed ?  ? ?Lower Extremity Assessment ?Lower Extremity Assessment: Generalized weakness;LLE deficits/detail ?LLE: Unable to fully assess due to pain ?  ? ?   ?Communication  ? Communication: HOH  ?Cognition Arousal/Alertness: Awake/alert ?Behavior During Therapy: [facility] for tasks assessed/performed ?Overall Cognitive Status: Within Functional Limits for tasks assessed ?  ?  ?  ?  ?  ?  ?  ?  ?  ?  ?  ?  ?  ?  ?  ?  ?  ?  ?  ? ?  ?General Comments   ? ?  ?Exercises Total Joint Exercises ?Ankle Circles/Pumps: AROM, Strengthening, 10 reps ?Quad Sets: 10 reps, Strengthening, Both ?Gluteal Sets: Strengthening, Both, 10 reps ?Long [employer]: Strengthening, Both, 10 reps ?Knee Flexion: Strengthening, Both, 10 reps ?Marching in Standing: Strengthening, Both, 5 reps, Standing ?Other Exercises ?Other Exercises: HEP education for BLE APs, QS, and GS  ? ?Assessment/Plan  ?  ?PT Assessment Patient needs continued PT services  ?PT Problem List Decreased strength;Decreased activity tolerance;Decreased balance;Decreased mobility;Decreased knowledge of use of DME;Pain ? ?   ?  ?PT Treatment Interventions DME instruction;Gait training;Stair training;Functional mobility training;Therapeutic activities;Therapeutic exercise;Balance training;Patient/family education   ? ?PT Goals (Current goals can be found in the Care Plan section)  ?Acute Rehab PT Goals ?Patient Stated Goal: To walk better ?PT Goal Formulation: With patient ?Time For Goal Achievement: 01/09/22 ?Potential to Achieve Goals: Good ? ?   ?Frequency BID ?  ? ? ?Co-evaluation   ?  ?  ?  ?  ? ? ?  ?AM-PAC PT "6 Clicks" Mobility  ?Outcome Measure Help needed turning from your back to your side while in a flat bed without using bedrails?: A Little ?Help needed moving from lying on your back to sitting on the side of a flat bed without using bedrails?: A Little ?Help needed moving to and from a bed to a chair (including a wheelchair)?: A Little ?Help needed standing up from a chair using your arms (e.g., wheelchair or bedside chair)?: A Little ?Help needed to walk in hospital room?: A Little ?Help needed climbing 3-5 steps with a railing? : A Lot ?6 Click Score: 17 ? ?  ?End of Session Equipment Utilized During Treatment: Gait belt ?Activity Tolerance: Patient tolerated treatment well;No increased pain ?Patient left: in chair;with call bell/phone within reach;with chair alarm set;with SCD's reapplied ?Nurse Communication: Mobility status;Weight bearing status ?PT Visit Diagnosis: Other abnormalities of gait and mobility (R26.89);Muscle weakness (generalized) (M62.81);Pain ?Pain - Right/Left: Left ?Pain - part of body: Hip ?  ? ?Time: 0930-1001 ?PT Time Calculation (min) (ACUTE ONLY): 31 min ? ? ?Charges:  PT Evaluation ?$PT Eval Moderate Complexity: 1 Mod ?PT Treatments ?$Therapeutic Exercise: 8-22 mins ?  ?   ? ?D. Royetta Asal PT, DPT ?12/27/21, 10:26 AM ? ? ?

## 2021-12-27 NOTE — Progress Notes (Signed)
?  Progress Note ? ? ?Patient: Melanie Carrillo BDZ:[id number] DOB: 1934/09/13 DOA: 12/25/2021     2 ?DOS: the patient was seen and examined on 12/27/2021 ?  ?Brief hospital course: ?Melanie Carrillo is a 87 y.o. female with medical history significant of interstitial lung disease who present to the hospital with hip fracture. ?Patient currently lives alone at home, she has interstitial lung disease, she has some short of breath with exertion and a cough.  But no hypoxia.  She went to the bathroom last night, tripped and fell on the ground.  She sustained a fracture in her left hip.  ?Left intramedullary nail performed on 5/6. ? ?Assessment and Plan: ?* Hip fracture ([place]) ?Status post surgery.  Continue pain medicine as needed.  PT OT has evaluated patient, recommended home with home care.  Probably discharge home tomorrow. ? ?Acute postoperative anemia due to expected blood loss ?Patient developed anemia with hemoglobin dropped down to 7.5.  This is  from blood loss from surgery.  Patient asymptomatic.  Iron study did not show any iron deficiency.  Continue to monitor, transfuse when hemoglobin less than 7.0. ? ?Elevated troponin ?Demand ischemia from hip fracture. ? ?Hyponatremia ?Sodium level 130 today, continue salt tablets. ? ?Rhabdomyolysis ?CK level still above 1000, continue some fluids. ? ?Interstitial lung disease ([place]) ?No hypoxemia, continue DuoNeb as needed.  Patient has some mucus with the cough.  No evidence of bacterial pneumonia. ? ? ? ? ?  ? ?Subjective:  ?Patient doing well, she was able to walk with physical therapy. ?No short of breath. ? ?Physical Exam: ?Vitals:  ? 12/26/21 2100 12/27/21 0432 12/27/21 0600 12/27/21 0757  ?BP: 111/61 (!) 87/64 134/63 (!) 113/96  ?Pulse: (!) 110 85  90  ?Resp: '19 20  15  '$ ?Temp: 98.4 ?F (36.9 ?C) 98.5 ?F (36.9 ?C)  98.9 ?F (37.2 ?C)  ?TempSrc: Oral     ?SpO2: 95% 96%  94%  ?Weight:      ?Height:      ? ?General exam: Appears calm and comfortable  ?Respiratory system: Clear to  auscultation. Respiratory effort normal. ?Cardiovascular system: S1 & S2 heard, RRR. No JVD, murmurs, rubs, gallops or clicks. No pedal edema. ?Gastrointestinal system: Abdomen is nondistended, soft and nontender. No organomegaly or masses felt. Normal bowel sounds heard. ?Central nervous system: Alert and oriented. No focal neurological deficits. ?Extremities: Symmetric 5 x 5 power. ?Skin: No rashes, lesions or ulcers ?Psychiatry: Judgement and insight appear normal. Mood & affect appropriate.  ? ?Data Reviewed: ? ?Lab results reviewed ? ?Family Communication: Daughter updated at the bedside ? ?Disposition: ?Status is: Inpatient ?Remains inpatient appropriate because: Severity of disease, IV treatment ? Planned Discharge Destination: Home with Home Health ? ? ? ?Time spent: 28 minutes ? ?Author: ?Sharen Hones, MD ?12/27/2021 12:53 PM ? ?For on call review [email or web address].  ?

## 2021-12-27 NOTE — Progress Notes (Signed)
OT Cancellation Note ? ?Patient Details ?Name: Melanie Carrillo ?MRN: [id number] ?DOB: 08/15/1935 ? ? ?Cancelled Treatment:    Reason Eval/Treat Not Completed: Fatigue/lethargy limiting ability to participate. OT order received. Chart reviewed. Upon arrival to pt room, pt resting in bed. Pt politely declines OT evaluation at this time citing fatigue from recent use of commode and PT session. Endorses desire to rest, but agreeable to OT returning at a later time/date. Will hold at this time and re-attempt as available and pt medically appropriate.  ? ?Shara Blazing, M.S., OTR/L ?Ascom: [phone] ?12/27/21, 11:19 AM ? ? ? ?

## 2021-12-27 NOTE — Progress Notes (Signed)
?Subjective: ? ?Patient reports pain as 7-8/10, and that is responsive to pain medication. ? ?Objective:  ? ?VITALS:   ?Vitals:  ? 12/26/21 1603 12/26/21 2100 12/27/21 0432 12/27/21 0600  ?BP: 128/89 111/61 (!) 87/64 134/63  ?Pulse: (!) 109 (!) 110 85   ?Resp: '19 19 20   '$ ?Temp: 97.6 ?F (36.4 ?C) 98.4 ?F (36.9 ?C) 98.5 ?F (36.9 ?C)   ?TempSrc: Oral Oral    ?SpO2: 99% 95% 96%   ?Weight:      ?Height:      ? ? ?PHYSICAL EXAM: ? ?General: Alert, comfortable ?Left lower extremity: Dressings are clean dry and intact, left lower extremity is grossly neurovascular intact ? ?LABS ? ?Results for orders placed or performed during the hospital encounter of 12/25/21 (from the past 24 hour(s))  ?Resp Panel by RT-PCR (Flu A&B, Covid) Nasopharyngeal Swab     Status: None  ? Collection Time: 12/26/21  8:42 AM  ? Specimen: Nasopharyngeal Swab; Nasopharyngeal(NP) swabs in vial transport medium  ?Result Value Ref Range  ? SARS Coronavirus 2 by RT PCR NEGATIVE NEGATIVE  ? Influenza A by PCR NEGATIVE NEGATIVE  ? Influenza B by PCR NEGATIVE NEGATIVE  ?CBC     Status: Abnormal  ? Collection Time: 12/27/21  4:31 AM  ?Result Value Ref Range  ? WBC 11.6 (H) 4.0 - 10.5 K/uL  ? RBC 2.27 (L) 3.87 - 5.11 MIL/uL  ? Hemoglobin 7.5 (L) 12.0 - 15.0 g/dL  ? HCT 22.6 (L) 36.0 - 46.0 %  ? MCV 99.6 80.0 - 100.0 fL  ? MCH 33.0 26.0 - 34.0 pg  ? MCHC 33.2 30.0 - 36.0 g/dL  ? RDW 12.1 11.5 - 15.5 %  ? Platelets 155 150 - 400 K/uL  ? nRBC 0.0 0.0 - 0.2 %  ?Basic metabolic panel     Status: Abnormal  ? Collection Time: 12/27/21  4:31 AM  ?Result Value Ref Range  ? Sodium 130 (L) 135 - 145 mmol/L  ? Potassium 4.5 3.5 - 5.1 mmol/L  ? Chloride 102 98 - 111 mmol/L  ? CO2 25 22 - 32 mmol/L  ? Glucose, Bld 121 (H) 70 - 99 mg/dL  ? BUN 19 8 - 23 mg/dL  ? Creatinine, Ser 0.51 0.44 - 1.00 mg/dL  ? Calcium 8.5 (L) 8.9 - 10.3 mg/dL  ? GFR, Estimated >60 >60 mL/min  ? Anion gap 3 (L) 5 - 15  ?Magnesium     Status: None  ? Collection Time: 12/27/21  4:31 AM  ?Result  Value Ref Range  ? Magnesium 2.2 1.7 - 2.4 mg/dL  ? ? ?DG Chest 1 View ? ?Result Date: 12/25/2021 ?CLINICAL DATA:  Preop fall EXAM: CHEST  1 VIEW COMPARISON:  None Available. FINDINGS: Low lung volumes. Mild diffuse reticular opacity suggests chronic lung disease. No acute airspace disease, pleural effusion or pneumothorax. Borderline cardiac size. Aortic atherosclerosis IMPRESSION: No active disease. Low lung volumes. Mild diffuse reticular opacity suggestive of chronic lung disease Electronically Signed   By: Donavan Foil M.D.   On: 12/25/2021 15:15  ? ?CT Head Wo Contrast ? ?Result Date: 12/25/2021 ?CLINICAL DATA:  Head trauma, minor (Age >= 65y); Neck trauma (Age >= 65y) EXAM: CT HEAD WITHOUT CONTRAST CT CERVICAL SPINE WITHOUT CONTRAST TECHNIQUE: Multidetector CT imaging of the head and cervical spine was performed following the standard protocol without intravenous contrast. Multiplanar CT image reconstructions of the cervical spine were also generated. RADIATION DOSE REDUCTION: This exam was performed according to the  departmental dose-optimization program which includes automated exposure control, adjustment of the mA and/or kV according to patient size and/or use of iterative reconstruction technique. COMPARISON:  None Available. FINDINGS: CT HEAD FINDINGS Brain: No evidence of acute intracranial hemorrhage or extra-axial collection.No evidence of mass lesion/concerning mass effect.The ventricles are normal in size.Scattered subcortical and periventricular white matter hypodensities, nonspecific but likely sequela of chronic small vessel ischemic disease.Mild cerebral atrophy Vascular: No hyperdense vessel. Skull: Negative for skull fracture. Sinuses/Orbits: There is paranasal sinus disease, with partially visualized opacification of the right maxillary sinus and mild ethmoid and sphenoid sinus mucosal thickening. Mastoid air cells are clear. The orbits are unremarkable. Other: None. CT CERVICAL SPINE FINDINGS  Alignment: Normal. Skull base and vertebrae: Unchanged mild central compression deformity of T3. There is no acute cervical spine fracture. Soft tissues and spinal canal: No prevertebral fluid or swelling. No visible canal hematoma. Disc levels: There is mild multilevel degenerative disc disease with trace degenerative anterolisthesis at C5-C6. There is moderate to severe multilevel facet arthropathy with fusion of the C2-C3 facets. Upper chest: Negative. Other: There is a 3.0 cm heterogeneous left thyroid nodule. IMPRESSION: No acute intracranial abnormality. Sequela of chronic small vessel ischemic disease. No acute cervical spine fracture. Multilevel degenerative disc disease and facet arthropathy. Heterogeneous 3.0 cm left thyroid nodule with intrathoracic extension. In the setting of significant comorbidities or limited life expectancy, no follow-up recommended. Otherwise, would recommend thyroid ultrasound if not previously evaluated. (Ref: J Am Coll Radiol. 2015 Feb;12(2): 143-50). Electronically Signed   By: Maurine Simmering M.D.   On: 12/25/2021 15:11  ? ?CT Cervical Spine Wo Contrast ? ?Result Date: 12/25/2021 ?CLINICAL DATA:  Head trauma, minor (Age >= 65y); Neck trauma (Age >= 65y) EXAM: CT HEAD WITHOUT CONTRAST CT CERVICAL SPINE WITHOUT CONTRAST TECHNIQUE: Multidetector CT imaging of the head and cervical spine was performed following the standard protocol without intravenous contrast. Multiplanar CT image reconstructions of the cervical spine were also generated. RADIATION DOSE REDUCTION: This exam was performed according to the departmental dose-optimization program which includes automated exposure control, adjustment of the mA and/or kV according to patient size and/or use of iterative reconstruction technique. COMPARISON:  None Available. FINDINGS: CT HEAD FINDINGS Brain: No evidence of acute intracranial hemorrhage or extra-axial collection.No evidence of mass lesion/concerning mass effect.The ventricles  are normal in size.Scattered subcortical and periventricular white matter hypodensities, nonspecific but likely sequela of chronic small vessel ischemic disease.Mild cerebral atrophy Vascular: No hyperdense vessel. Skull: Negative for skull fracture. Sinuses/Orbits: There is paranasal sinus disease, with partially visualized opacification of the right maxillary sinus and mild ethmoid and sphenoid sinus mucosal thickening. Mastoid air cells are clear. The orbits are unremarkable. Other: None. CT CERVICAL SPINE FINDINGS Alignment: Normal. Skull base and vertebrae: Unchanged mild central compression deformity of T3. There is no acute cervical spine fracture. Soft tissues and spinal canal: No prevertebral fluid or swelling. No visible canal hematoma. Disc levels: There is mild multilevel degenerative disc disease with trace degenerative anterolisthesis at C5-C6. There is moderate to severe multilevel facet arthropathy with fusion of the C2-C3 facets. Upper chest: Negative. Other: There is a 3.0 cm heterogeneous left thyroid nodule. IMPRESSION: No acute intracranial abnormality. Sequela of chronic small vessel ischemic disease. No acute cervical spine fracture. Multilevel degenerative disc disease and facet arthropathy. Heterogeneous 3.0 cm left thyroid nodule with intrathoracic extension. In the setting of significant comorbidities or limited life expectancy, no follow-up recommended. Otherwise, would recommend thyroid ultrasound if not previously evaluated. (Ref: J Am  Coll Radiol. 2015 Feb;12(2): 143-50). Electronically Signed   By: Maurine Simmering M.D.   On: 12/25/2021 15:11  ? ?DG C-Arm 1-60 Min-No Report ? ?Result Date: 12/26/2021 ?Fluoroscopy was utilized by the requesting physician.  No radiographic interpretation.  ? ?DG Hip Unilat W or Wo Pelvis 2-3 Views Left ? ?Result Date: 12/25/2021 ?CLINICAL DATA:  Pain post fall EXAM: DG HIP (WITH OR WITHOUT PELVIS) 2-3V LEFT COMPARISON:  None Available. FINDINGS: SI joints are  non widened. Clips in the pelvis. Pubic symphysis and rami are intact. Both femoral heads are normally positioned. Acute comminuted left intertrochanteric fracture with involvement of the subtrochanteric s

## 2021-12-27 NOTE — Anesthesia Postprocedure Evaluation (Signed)
Anesthesia Post Note ? ?Patient: Melanie Carrillo ? ?Procedure(s) Performed: INTRAMEDULLARY (IM) NAIL INTERTROCHANTRIC (Left: Hip) ? ?Patient location during evaluation: PACU ?Anesthesia Type: General ?Level of consciousness: awake and alert ?Pain management: pain level controlled ?Vital Signs Assessment: post-procedure vital signs reviewed and stable ?Respiratory status: spontaneous breathing, nonlabored ventilation, respiratory function stable and patient connected to nasal cannula oxygen ?Cardiovascular status: blood pressure returned to baseline and stable ?Postop Assessment: no apparent nausea or vomiting ?Anesthetic complications: no ? ? ?No notable events documented. ? ? ?Last Vitals:  ?Vitals:  ? 12/26/21 2100 12/27/21 0432  ?BP: 111/61 (!) 87/64  ?Pulse: (!) 110 85  ?Resp: 19 20  ?Temp: 36.9 ?C 36.9 ?C  ?SpO2: 95% 96%  ?  ?Last Pain:  ?Vitals:  ? 12/27/21 0245  ?TempSrc:   ?PainSc: 0-No pain  ? ? ?  ?  ?  ?  ?  ?  ? ?Arita Miss ? ? ? ? ?

## 2021-12-27 NOTE — Assessment & Plan Note (Addendum)
Patient received 1 unit of PRBC.  Iron level normal.  B12 level borderline, homocystine WNL.  Received a B12 shot x2 (2 days ago and yesterday) ?

## 2021-12-27 NOTE — Progress Notes (Signed)
Pt OOB to BSC. Assisted pt back into bed. Bed is low, locked and alarmed. Pt denies any complaints. No acute concerns  ?

## 2021-12-28 ENCOUNTER — Encounter: Payer: Self-pay | Admitting: Orthopaedic Surgery

## 2021-12-28 DIAGNOSIS — D62 Acute posthemorrhagic anemia: Secondary | ICD-10-CM | POA: Diagnosis not present

## 2021-12-28 DIAGNOSIS — S72002A Fracture of unspecified part of neck of left femur, initial encounter for closed fracture: Secondary | ICD-10-CM | POA: Diagnosis not present

## 2021-12-28 DIAGNOSIS — E871 Hypo-osmolality and hyponatremia: Secondary | ICD-10-CM | POA: Diagnosis not present

## 2021-12-28 LAB — BASIC METABOLIC PANEL
Anion gap: 4 — ABNORMAL LOW (ref 5–15)
BUN: 13 mg/dL (ref 8–23)
CO2: 26 mmol/L (ref 22–32)
Calcium: 8.5 mg/dL — ABNORMAL LOW (ref 8.9–10.3)
Chloride: 101 mmol/L (ref 98–111)
Creatinine, Ser: 0.41 mg/dL — ABNORMAL LOW (ref 0.44–1.00)
GFR, Estimated: >60 mL/min (ref >60)
Glucose, Bld: 97 mg/dL (ref 70–99)
Potassium: 3.9 mmol/L (ref 3.5–5.1)
Sodium: 131 mmol/L — ABNORMAL LOW (ref 135–145)

## 2021-12-28 LAB — CBC
HCT: 21.5 % — ABNORMAL LOW (ref 36.0–46.0)
Hemoglobin: 7 g/dL — ABNORMAL LOW (ref 12.0–15.0)
MCH: 32.9 pg (ref 26.0–34.0)
MCHC: 32.6 g/dL (ref 30.0–36.0)
MCV: 100.9 fL — ABNORMAL HIGH (ref 80.0–100.0)
Platelets: 163 10*3/uL (ref 150–400)
RBC: 2.13 MIL/uL — ABNORMAL LOW (ref 3.87–5.11)
RDW: 12.5 % (ref 11.5–15.5)
WBC: 8 10*3/uL (ref 4.0–10.5)
nRBC: 0.2 % (ref 0.0–0.2)

## 2021-12-28 LAB — CK: Total CK: 925 U/L — ABNORMAL HIGH (ref 38–234)

## 2021-12-28 LAB — PREPARE RBC (CROSSMATCH)

## 2021-12-28 LAB — MAGNESIUM: Magnesium: 2 mg/dL (ref 1.7–2.4)

## 2021-12-28 LAB — ABO/RH: ABO/RH(D): O POS

## 2021-12-28 MED ORDER — CYANOCOBALAMIN 1000 MCG/ML IJ SOLN
1000.0000 ug | Freq: Once | INTRAMUSCULAR | Status: AC
  Administered 2021-12-28: 1000 ug via INTRAMUSCULAR
  Filled 2021-12-28: qty 1

## 2021-12-28 MED ORDER — SODIUM CHLORIDE 0.9% IV SOLUTION: Freq: Once | INTRAVENOUS | Status: AC

## 2021-12-28 MED ORDER — SERTRALINE HCL 50 MG PO TABS
25.0000 mg | ORAL_TABLET | Freq: Every day | ORAL | Status: DC
  Administered 2021-12-28 – 2021-12-31 (×4): 25 mg via ORAL
  Filled 2021-12-28 (×4): qty 1

## 2021-12-28 NOTE — Progress Notes (Signed)
?  Progress Note ? ? ?Patient: Melanie Carrillo YQI:[id number] DOB: July 31, 1935 DOA: 12/25/2021     3 ?DOS: the patient was seen and examined on 12/28/2021 ?  ?Brief hospital course: ?Melanie Carrillo is a 87 y.o. female with medical history significant of interstitial lung disease who present to the hospital with hip fracture. ?Patient currently lives alone at home, she has interstitial lung disease, she has some short of breath with exertion and a cough.  But no hypoxia.  She went to the bathroom last night, tripped and fell on the ground.  She sustained a fracture in her left hip.  ?Left intramedullary nail performed on 5/6. ?Hemoglobin dropped down to 7.0, transfuse 1 unit PRBC ? ?Assessment and Plan: ?* Hip fracture ([place]) ?Status post surgery.   ?Doing well, continue PT and OT. ? ?Acute postoperative anemia due to expected blood loss ?Hemoglobin dropped down to 7.0, discussed with patient and family, will give 1 unit PRBC.  B12 level is borderline, give B12 injection and check homocystine level. ?Iron level normal. ? ?Elevated troponin ?Demand ischemia from hip fracture. ? ?Hyponatremia ?Appear to be secondary to polydipsia.  Continue salt tablets, patient was not drinking excessive water in the hospital.  Recheck a BMP tomorrow ? ?Rhabdomyolysis ?CK level dropped down to below 1000, will discontinue IV fluids ? ?Interstitial lung disease ([place]) ?Condition still stable. ? ? ? ? ?  ? ?Subjective:  ?Patient doing well, was able to walk with physical therapy. ?Denies any shortness of breath. ? ?Physical Exam: ?Vitals:  ? 12/27/21 1627 12/27/21 1955 12/28/21 0350 12/28/21 3875  ?BP: (!) 142/62 (!) 143/47 (!) 138/56 (!) 145/54  ?Pulse: 80 (!) 106 82 93  ?Resp: '16 20 19 18  '$ ?Temp: 98.2 ?F (36.8 ?C) 98.5 ?F (36.9 ?C) 98.3 ?F (36.8 ?C) 97.8 ?F (36.6 ?C)  ?TempSrc:  Oral Oral Oral  ?SpO2: 94% 95% 99% 96%  ?Weight:      ?Height:      ? ?General exam: Appears calm and comfortable  ?Respiratory system: Clear to auscultation. Respiratory  effort normal. ?Cardiovascular system: S1 & S2 heard, RRR. No JVD, murmurs, rubs, gallops or clicks. No pedal edema. ?Gastrointestinal system: Abdomen is nondistended, soft and nontender. No organomegaly or masses felt. Normal bowel sounds heard. ?Central nervous system: Alert and oriented. No focal neurological deficits. ?Extremities: Symmetric 5 x 5 power. ?Skin: No rashes, lesions or ulcers ?Psychiatry: Judgement and insight appear normal. Mood & affect appropriate.  ?Surgical site has no hematoma, no bleeding ? ?Data Reviewed: ? ?Lab results reviewed. ? ?Family Communication: Daughter updated at the bedside ? ?Disposition: ?Status is: Inpatient ?Remains inpatient appropriate because: Severity of disease, IV treatment. ? Planned Discharge Destination: Home with Home Health ? ? ? ?Time spent: 28 minutes ? ?Author: ?Sharen Hones, MD ?12/28/2021 9:52 AM ? ?For on call review [email or web address].  ?

## 2021-12-28 NOTE — Evaluation (Addendum)
Occupational Therapy Evaluation Patient Details Name: Melanie Carrillo MRN: [id number] DOB: Apr 20, 1935 Today's Date: 12/28/2021   History of Present Illness Pt is an 87 y.o. female with medical history significant of interstitial lung disease and HTN who presented to the hospital s/p fall with L hip fracture.   Pt now s/p L femur IM nail on 12/26/21.   Clinical Impression   Ms. Pipp was seen for OT evaluation this date, POD#2 from above surgery. Pt was independent in all ADLs prior to surgery, and denies additional falls history in the past 6 months. Pt daughter at bedside verifies pt PLOF and states pt is independent, living alone, in a home with long distances to walk from her bedroom to her living room, etc. Pt is eager to return to PLOF with less pain and improved safety and independence. Pt currently requires CGA to SUPERVISION assist for bed/functional mobility, toilet transfer, and toileting using a RW. Pt notably weak, and c/o of significant fatigue with minimal functional activity. Anticipate MOD A for more exertional ADL management including LB dressing and bathing. Pt instructed in self care skills, falls prevention strategies, home/routines modifications, & DME/AE for LB bathing and dressing tasks. Pt would benefit from additional instruction in self care skills and techniques to help maintain precautions with or without assistive devices to support recall and carryover prior to discharge. Recommend STR upon discharge.        Recommendations for follow up therapy are one component of a multi-disciplinary discharge planning process, led by the attending physician.  Recommendations may be updated based on patient status, additional functional criteria and insurance authorization.   Follow Up Recommendations  Skilled nursing-short term rehab (<3 hours/day)    Assistance Recommended at Discharge Set up Supervision/Assistance  Patient can return home with the following A little help with  walking and/or transfers;A little help with bathing/dressing/bathroom    Functional Status Assessment  Patient has had a recent decline in their functional status and demonstrates the ability to make significant improvements in function in a reasonable and predictable amount of time.  Equipment Recommendations  BSC/3in1    Recommendations for Other Services       Precautions / Restrictions Precautions Precautions: Fall Restrictions Weight Bearing Restrictions: Yes LLE Weight Bearing: Weight bearing as tolerated      Mobility Bed Mobility Overal bed mobility: Needs Assistance Bed Mobility: Supine to Sit     Supine to sit: Min guard, HOB elevated          Transfers Overall transfer level: Needs assistance Equipment used: Rolling walker (2 wheels) Transfers: Sit to/from Stand Sit to Stand: Min guard           General transfer comment: VCs for hand placement during Stand>sit t/f. Pt noted with limited eccentric control on decent. Requires cueing for safe transfer technique.      Balance Overall balance assessment: Needs assistance   Sitting balance-Leahy Scale: Good Sitting balance - Comments: steady static sitting, reaching within BOS.   Standing balance support: Bilateral upper extremity supported, During functional activity, Reliant on assistive device for balance Standing balance-Leahy Scale: Fair                             ADL either performed or assessed with clinical judgement   ADL Overall ADL's : Needs assistance/impaired  General ADL Comments: Pt is functionally limtied by increased pain and decreased AROM in her LLE. She requires MIN GUARD to SUPERVISION assisst for bed/functional mobility. MIN GUARD for toilet transfer and toileting with increased cueing for hand placement during STS 2/2 poor eccentric control. Anticipate MOD A for LB ADL management from STS 2/2 limited LB access.      Vision         Perception     Praxis      Pertinent Vitals/Pain Pain Assessment Pain Assessment: 0-10 Pain Score: 8  Pain Location: L hip with mobility Pain Descriptors / Indicators: Sore, Aching Pain Intervention(s): Limited activity within patient's tolerance, Monitored during session, Repositioned, Patient requesting pain meds-RN notified     Hand Dominance     Extremity/Trunk Assessment Upper Extremity Assessment Upper Extremity Assessment: Generalized weakness   Lower Extremity Assessment Lower Extremity Assessment: Generalized weakness LLE Deficits / Details: s/p L IM Nail; WBAT       Communication Communication Communication: HOH   Cognition Arousal/Alertness: Awake/alert, Lethargic Behavior During Therapy: WFL for tasks assessed/performed Overall Cognitive Status: Within Functional Limits for tasks assessed                                       General Comments  VSS t/o session. SpO2 noted to drop to 90% after functional mobility in room. Rebounds to >/= 92% after rest break and cues for PLB. PT remains on RA t/o session.    Exercises Other Exercises Other Exercises: Pt/caregiver educated on role of OT in acute setting, DC recs, Safe transfer technique, falls prevention, and safe use of AE/DME for ADL maangement.   Shoulder Instructions      Home Living Family/patient expects to be discharged to:: Private residence Living Arrangements: Alone Available Help at Discharge: Family;Available 24 hours/day (plans to stay with daughter first week after sx.) Type of Home: House Home Access: Stairs to enter [employer] of Steps: 1 Entrance Stairs-Rails: None Home Layout: One level     Bathroom Shower/Tub: Producer, television/film/video: Standard     Home Equipment: Wheelchair - manual          Prior Functioning/Environment Prior Level of Function : Independent/Modified Independent             Mobility  Comments: Ind amb community distances without an AD, drives, no other fall history other than admitting fall ADLs Comments: Ind with ADLs        OT Problem List: Decreased strength;Decreased coordination;Pain;Decreased range of motion;Decreased activity tolerance;Decreased safety awareness;Impaired balance (sitting and/or standing);Decreased knowledge of use of DME or AE      OT Treatment/Interventions: Self-care/ADL training;Therapeutic exercise;Therapeutic activities;DME and/or AE instruction;Patient/family education;Balance training;Energy conservation    OT Goals(Current goals can be found in the care plan section) Acute Rehab OT Goals Patient Stated Goal: To go home OT Goal Formulation: With patient Time For Goal Achievement: 01/11/22 Potential to Achieve Goals: Good ADL Goals Pt Will Perform Lower Body Dressing: with modified independence;sit to/from stand;with adaptive equipment Pt Will Transfer to Toilet: with modified independence;bedside commode;ambulating;regular height toilet Pt Will Perform Toileting - Clothing Manipulation and hygiene: with modified independence;with adaptive equipment;sit to/from stand  OT Frequency: Min 2X/week    Co-evaluation              AM-PAC OT "6 Clicks" Daily Activity     Outcome Measure Help from another person eating  meals?: None Help from another person taking care of personal grooming?: None Help from another person toileting, which includes using toliet, bedpan, or urinal?: A Little Help from another person bathing (including washing, rinsing, drying)?: A Little Help from another person to put on and taking off regular upper body clothing?: None Help from another person to put on and taking off regular lower body clothing?: A Little 6 Click Score: 21   End of Session Equipment Utilized During Treatment: Rolling walker (2 wheels)  Activity Tolerance: Patient tolerated treatment well Patient left: in chair;with call bell/phone  within reach;with chair alarm set;with family/visitor present  OT Visit Diagnosis: Other abnormalities of gait and mobility (R26.89);Muscle weakness (generalized) (M62.81);History of falling (Z91.81);Pain Pain - Right/Left: Left Pain - part of body: Hip;Knee                Time: 1191-4782 OT Time Calculation (min): 28 min Charges:  OT General Charges $OT Visit: 1 Visit OT Evaluation $OT Eval Moderate Complexity: 1 Mod OT Treatments $Self Care/Home Management : 8-22 mins  Rockney Ghee, M.S., OTR/L Ascom: [phone] 12/28/21, 10:06 AM

## 2021-12-28 NOTE — Plan of Care (Signed)

## 2021-12-28 NOTE — Progress Notes (Signed)
Physical Therapy Treatment ?Patient Details ?Name: Melanie Carrillo ?MRN: [id number] ?DOB: 1935/07/23 ?Today's Date: 12/28/2021 ? ? ?History of Present Illness Pt is an 87 y.o. female with medical history significant of interstitial lung disease and HTN who presented to the hospital s/p fall with L hip fracture.   Pt now s/p L femur IM nail on 12/26/21. ? ?  ?PT Comments  ? ? Pt was pleasant and motivated to participate during the session and put forth good effort throughout. Pt required min A for LLE control with bed mobility tasks and cuing for sequencing with transfer training from various surfaces.  Pt was able to amb a max of 10 feet x 2 limited by fatigue and L hip pain but reported no other adverse symptoms with SpO2 and HR WNL on room air.  Pt will benefit from PT services in a SNF setting upon discharge to safely address deficits listed in patient problem list for decreased caregiver assistance and eventual return to PLOF. ? ?   ?Recommendations for follow up therapy are one component of a multi-disciplinary discharge planning process, led by the attending physician.  Recommendations may be updated based on patient status, additional functional criteria and insurance authorization. ? ?Follow Up Recommendations ? Skilled nursing-short term rehab (<3 hours/day) ?  ?  ?Assistance Recommended at Discharge Frequent or constant Supervision/Assistance  ?Patient can return home with the following A little help with walking and/or transfers;A little help with bathing/dressing/bathroom;Assistance with cooking/housework;Help with stairs or ramp for entrance;Assist for transportation ?  ?Equipment Recommendations ? Rolling walker (2 wheels)  ?  ?Recommendations for Other Services   ? ? ?  ?Precautions / Restrictions Precautions ?Precautions: Fall ?Restrictions ?Weight Bearing Restrictions: Yes ?LLE Weight Bearing: Weight bearing as tolerated  ?  ? ?Mobility ? Bed Mobility ?Overal bed mobility: Needs Assistance ?Bed Mobility:  Sit to Supine, Supine to Sit ?  ?  ?Supine to sit: Min assist ?Sit to supine: Min assist ?  ?General bed mobility comments: Min A for LLE control ?  ? ?Transfers ?Overall transfer level: Needs assistance ?Equipment used: Rolling walker (2 wheels) ?Transfers: Sit to/from Stand ?Sit to Stand: Min guard ?  ?  ?  ?  ?  ?General transfer comment: Min verbal cues for sequencing with fair control and stability ?  ? ?Ambulation/Gait ?Ambulation/Gait assistance: Min guard ?Gait Distance (Feet): 10 Feet x 2 ?Assistive device: Rolling walker (2 wheels) ?Gait Pattern/deviations: Step-to pattern, Antalgic, Decreased step length - right, Decreased stance time - left ?Gait velocity: decreased ?  ?  ?General Gait Details: Slow cadence with short step-to pattern, antalgic on the LLE but steady without LOB or buckling ? ? ?Stairs ?  ?  ?  ?  ?  ? ? ?Wheelchair Mobility ?  ? ?Modified Rankin (Stroke Patients Only) ?  ? ? ?  ?Balance Overall balance assessment: Needs assistance ?  ?Sitting balance-Leahy Scale: Good ?  ?  ?Standing balance support: Bilateral upper extremity supported, During functional activity, Reliant on assistive device for balance ?Standing balance-Leahy Scale: Fair ?  ?  ?  ?  ?  ?  ?  ?  ?  ?  ?  ?  ?  ? ?  ?Cognition Arousal/Alertness: Awake/alert ?Behavior During Therapy: [facility] for tasks assessed/performed ?Overall Cognitive Status: Within Functional Limits for tasks assessed ?  ?  ?  ?  ?  ?  ?  ?  ?  ?  ?  ?  ?  ?  ?  ?  ?  ?  ?  ? ?  ?  Exercises Total Joint Exercises ?Ankle Circles/Pumps: AROM, Strengthening, 10 reps ?Quad Sets: 10 reps, Strengthening, Both ?Gluteal Sets: Strengthening, Both, 10 reps ?Long [employer]: Strengthening, Both, 10 reps, 5 reps ?Knee Flexion: Strengthening, Both, 10 reps, 5 reps ?Marching in Standing: Strengthening, Both, 5 reps, Standing ?Other Exercises ?Other Exercises: HEP review for BLE APs, QS, and GS ? ?  ?General Comments   ?  ?  ? ?Pertinent Vitals/Pain Pain Assessment ?Pain  Assessment: 0-10 ?Pain Score: 7  ?Pain Location: L hip ?Pain Descriptors / Indicators: Sore, Aching ?Pain Intervention(s): Repositioned, Premedicated before session, Monitored during session  ? ? ?Home Living   ?  ?  ?  ?  ?  ?  ?  ?  ?  ?   ?  ?Prior Function    ?  ?  ?   ? ?PT Goals (current goals can now be found in the care plan section) Progress towards PT goals: Progressing toward goals ? ?  ?Frequency ? ? ? BID ? ? ? ?  ?PT Plan Current plan remains appropriate  ? ? ?Co-evaluation   ?  ?  ?  ?  ? ?  ?AM-PAC PT "6 Clicks" Mobility   ?Outcome Measure ? Help needed turning from your back to your side while in a flat bed without using bedrails?: A Little ?Help needed moving from lying on your back to sitting on the side of a flat bed without using bedrails?: A Little ?Help needed moving to and from a bed to a chair (including a wheelchair)?: A Little ?Help needed standing up from a chair using your arms (e.g., wheelchair or bedside chair)?: A Little ?Help needed to walk in hospital room?: A Lot ?Help needed climbing 3-5 steps with a railing? : A Lot ?6 Click Score: 16 ? ?  ?End of Session Equipment Utilized During Treatment: Gait belt ?Activity Tolerance: Patient limited by fatigue ?Patient left: in bed;with call bell/phone within reach;with bed alarm set;with family/visitor present ?Nurse Communication: Mobility status;Weight bearing status ?PT Visit Diagnosis: Other abnormalities of gait and mobility (R26.89);Muscle weakness (generalized) (M62.81);Pain ?Pain - Right/Left: Left ?Pain - part of body: Hip ?  ? ? ?Time: 7425-9563 ?PT Time Calculation (min) (ACUTE ONLY): 25 min ? ?Charges:  $Gait Training: 8-22 mins ?$Therapeutic Exercise: 8-22 mins          ?          ? ?D. Royetta Asal PT, DPT ?12/28/21, 4:54 PM ? ? ?

## 2021-12-28 NOTE — Progress Notes (Signed)
Physical Therapy Treatment ?Patient Details ?Name: Melanie Carrillo ?MRN: [id number] ?DOB: 02-12-1935 ?Today's Date: 12/28/2021 ? ? ?History of Present Illness Pt is an 87 y.o. female with medical history significant of interstitial lung disease and HTN who presented to the hospital s/p fall with L hip fracture.   Pt now s/p L femur IM nail on 12/26/21. ? ?  ?PT Comments  ? ? Pt was pleasant and willing to participate with therapy but reported feeling very fatigued at onset of session. Once pt was in standing she was only able to take a few short steps from the chair to bed before needing to return to sitting.  Of note pt's Hgb 7.0 with transfusion pending.  Will update recommendation to SNF based on pt's current functional deficits but will continue to monitor pt's progress closely going forward.  Pt will benefit from PT services in a SNF setting upon discharge to safely address deficits listed in patient problem list for decreased caregiver assistance and eventual return to PLOF. ? ?   ?Recommendations for follow up therapy are one component of a multi-disciplinary discharge planning process, led by the attending physician.  Recommendations may be updated based on patient status, additional functional criteria and insurance authorization. ? ?Follow Up Recommendations ? Skilled nursing-short term rehab (<3 hours/day) ?  ?  ?Assistance Recommended at Discharge Frequent or constant Supervision/Assistance  ?Patient can return home with the following A little help with walking and/or transfers;A little help with bathing/dressing/bathroom;Assistance with cooking/housework;Help with stairs or ramp for entrance;Assist for transportation ?  ?Equipment Recommendations ? Rolling walker (2 wheels)  ?  ?Recommendations for Other Services   ? ? ?  ?Precautions / Restrictions Precautions ?Precautions: Fall ?Restrictions ?Weight Bearing Restrictions: Yes ?LLE Weight Bearing: Weight bearing as tolerated  ?  ? ?Mobility ? Bed  Mobility ?Overal bed mobility: Needs Assistance ?Bed Mobility: Sit to Supine ?  ?  ?  ?Sit to supine: Min assist ?  ?General bed mobility comments: Min A for LLE and trunk control ?  ? ?Transfers ?Overall transfer level: Needs assistance ?Equipment used: Rolling walker (2 wheels) ?Transfers: Sit to/from Stand ?Sit to Stand: Min guard ?  ?  ?  ?  ?  ?General transfer comment: Min verbal cues for sequencing but fair control and stability ?  ? ?Ambulation/Gait ?Ambulation/Gait assistance: Min guard ?Gait Distance (Feet): 2 Feet ?Assistive device: Rolling walker (2 wheels) ?Gait Pattern/deviations: Step-to pattern, Antalgic, Decreased step length - right, Decreased stance time - left ?Gait velocity: decreased ?  ?  ?General Gait Details: Pt only able to take a few steps from bed to chair this session before becomming too fatigued to continue; antalgic step-to pattern but steady without LOB ? ? ?Stairs ?  ?  ?  ?  ?  ? ? ?Wheelchair Mobility ?  ? ?Modified Rankin (Stroke Patients Only) ?  ? ? ?  ?Balance Overall balance assessment: Needs assistance ?  ?Sitting balance-Leahy Scale: Good ?  ?  ?Standing balance support: Bilateral upper extremity supported, During functional activity, Reliant on assistive device for balance ?Standing balance-Leahy Scale: Fair ?  ?  ?  ?  ?  ?  ?  ?  ?  ?  ?  ?  ?  ? ?  ?Cognition Arousal/Alertness: Lethargic ?Behavior During Therapy: [facility] for tasks assessed/performed ?Overall Cognitive Status: Within Functional Limits for tasks assessed ?  ?  ?  ?  ?  ?  ?  ?  ?  ?  ?  ?  ?  ?  ?  ?  ?  ?  ?  ? ?  ?  Exercises   ? ?  ?General Comments General comments (skin integrity, edema, etc.): VSS t/o session. SpO2 noted to drop to 90% after functional mobility in room. Rebounds to >/= 92% after rest break and cues for PLB. PT remains on RA t/o session. ?  ?  ? ?Pertinent Vitals/Pain Pain Assessment ?Pain Assessment: 0-10 ?Pain Score: 8  ?Pain Location: L hip ?Pain Descriptors / Indicators: Sore,  Aching ?Pain Intervention(s): Repositioned, Premedicated before session, Monitored during session  ? ? ?Home Living Family/patient expects to be discharged to:: Private residence ?Living Arrangements: Alone ?Available Help at Discharge: Family;Available 24 hours/day (plans to stay with daughter first week after sx.) ?Type of Home: House ?Home Access: Stairs to enter ?Entrance Stairs-Rails: None ?Entrance Stairs-Number of Steps: 1 ?  ?Home Layout: One level ?Home Equipment: Wheelchair - manual ?   ?  ?Prior Function    ?  ?  ?   ? ?PT Goals (current goals can now be found in the care plan section) Progress towards PT goals: PT to reassess next treatment ? ?  ?Frequency ? ? ? BID ? ? ? ?  ?PT Plan Discharge plan needs to be updated  ? ? ?Co-evaluation   ?  ?  ?  ?  ? ?  ?AM-PAC PT "6 Clicks" Mobility   ?Outcome Measure ? Help needed turning from your back to your side while in a flat bed without using bedrails?: A Little ?Help needed moving from lying on your back to sitting on the side of a flat bed without using bedrails?: A Little ?Help needed moving to and from a bed to a chair (including a wheelchair)?: A Little ?Help needed standing up from a chair using your arms (e.g., wheelchair or bedside chair)?: A Little ?Help needed to walk in hospital room?: A Lot ?Help needed climbing 3-5 steps with a railing? : A Lot ?6 Click Score: 16 ? ?  ?End of Session Equipment Utilized During Treatment: Gait belt ?Activity Tolerance: Patient limited by fatigue ?Patient left: in bed;with call bell/phone within reach;with bed alarm set;with family/visitor present ?Nurse Communication: Mobility status;Weight bearing status ?PT Visit Diagnosis: Other abnormalities of gait and mobility (R26.89);Muscle weakness (generalized) (M62.81);Pain ?Pain - Right/Left: Left ?Pain - part of body: Hip ?  ? ? ?Time: 1696-7893 ?PT Time Calculation (min) (ACUTE ONLY): 19 min ? ?Charges:  $Therapeutic Activity: 8-22 mins          ?          ? ?D. Royetta Asal PT, DPT ?12/28/21, 10:54 AM ? ? ?

## 2021-12-28 NOTE — Care Management Important Message (Signed)
Important Message ? ?Patient Details  ?Name: Melanie Carrillo ?MRN: [id number] ?Date of Birth: 04-15-1935 ? ? ?Medicare Important Message Given:  N/A - LOS <3 / Initial given by admissions ? ? ? ? ?Juliann Pulse A Sheron Tallman ?12/28/2021, 10:21 AM ?

## 2021-12-29 DIAGNOSIS — S72002A Fracture of unspecified part of neck of left femur, initial encounter for closed fracture: Secondary | ICD-10-CM | POA: Diagnosis not present

## 2021-12-29 DIAGNOSIS — D62 Acute posthemorrhagic anemia: Secondary | ICD-10-CM | POA: Diagnosis not present

## 2021-12-29 DIAGNOSIS — E871 Hypo-osmolality and hyponatremia: Secondary | ICD-10-CM | POA: Diagnosis not present

## 2021-12-29 LAB — HOMOCYSTEINE: Homocysteine: 10.1 umol/L (ref 0.0–21.3)

## 2021-12-29 LAB — TYPE AND SCREEN
ABO/RH(D): O POS
Antibody Screen: NEGATIVE
Unit division: 0

## 2021-12-29 LAB — BASIC METABOLIC PANEL
Anion gap: 4 — ABNORMAL LOW (ref 5–15)
BUN: 11 mg/dL (ref 8–23)
CO2: 26 mmol/L (ref 22–32)
Calcium: 8.6 mg/dL — ABNORMAL LOW (ref 8.9–10.3)
Chloride: 99 mmol/L (ref 98–111)
Creatinine, Ser: 0.48 mg/dL (ref 0.44–1.00)
GFR, Estimated: >60 mL/min (ref >60)
Glucose, Bld: 107 mg/dL — ABNORMAL HIGH (ref 70–99)
Potassium: 3.9 mmol/L (ref 3.5–5.1)
Sodium: 129 mmol/L — ABNORMAL LOW (ref 135–145)

## 2021-12-29 LAB — CBC
HCT: 25.3 % — ABNORMAL LOW (ref 36.0–46.0)
Hemoglobin: 8.3 g/dL — ABNORMAL LOW (ref 12.0–15.0)
MCH: 29.5 pg (ref 26.0–34.0)
MCHC: 32.8 g/dL (ref 30.0–36.0)
MCV: 90 fL (ref 80.0–100.0)
Platelets: 172 10*3/uL (ref 150–400)
RBC: 2.81 MIL/uL — ABNORMAL LOW (ref 3.87–5.11)
WBC: 9.2 10*3/uL (ref 4.0–10.5)
nRBC: 0.3 % — ABNORMAL HIGH (ref 0.0–0.2)

## 2021-12-29 LAB — BPAM RBC
Blood Product Expiration Date: 202306072359
ISSUE DATE / TIME: 202305081245
Unit Type and Rh: 5100

## 2021-12-29 MED ORDER — ENSURE ENLIVE PO LIQD
237.0000 mL | Freq: Two times a day (BID) | ORAL | Status: DC
  Administered 2021-12-29 – 2021-12-31 (×5): 237 mL via ORAL

## 2021-12-29 MED ORDER — LACTULOSE 10 GM/15ML PO SOLN
20.0000 g | Freq: Once | ORAL | Status: AC
  Administered 2021-12-29: 20 g via ORAL
  Filled 2021-12-29: qty 30

## 2021-12-29 MED ORDER — CYANOCOBALAMIN 1000 MCG/ML IJ SOLN
1000.0000 ug | Freq: Once | INTRAMUSCULAR | Status: AC
  Administered 2021-12-29: 1000 ug via INTRAMUSCULAR
  Filled 2021-12-29: qty 1

## 2021-12-29 NOTE — NC FL2 (Signed)
?[place] MEDICAID FL2 LEVEL OF CARE SCREENING TOOL  ?  ? ?IDENTIFICATION  ?Patient Name: ?Melanie Carrillo Birthdate: October 01, 1934 Sex: female Admission Date (Current Location): ?12/25/2021  ?[place] and [place] Number: ? [place] ?  Facility and Address:  ?[facility], [address] [place], [place] [address] ?     Provider Number: ?[id number]  ?Attending Physician Name and Address:  ?Sharen Hones, MD ? Relative Name and Phone Number:  ?[name] daughter [phone] ?   ?Current Level of Care: ?Hospital Recommended Level of Care: ?[place] Prior Approval Number: ?  ? ?Date Approved/Denied: ?  PASRR Number: ?[id number] A ? ?Discharge Plan: ?SNF ?  ? ?Current Diagnoses: ?Patient Active Problem List  ? Diagnosis Date Noted  ? Acute postoperative anemia due to expected blood loss 12/27/2021  ? Hip fracture ([place]) 12/25/2021  ? Interstitial lung disease ([place]) 12/25/2021  ? Rhabdomyolysis 12/25/2021  ? Hyponatremia 12/25/2021  ? Elevated troponin 12/25/2021  ? ? ?Orientation RESPIRATION BLADDER Height & Weight   ?  ?Self, Time, Situation, Place ? Normal Continent, External catheter Weight: 73.8 kg ?Height:  '5\' 4"'$  (162.6 cm)  ?BEHAVIORAL SYMPTOMS/MOOD NEUROLOGICAL BOWEL NUTRITION STATUS  ?    Continent Diet (see dc summary)  ?AMBULATORY STATUS COMMUNICATION OF NEEDS Skin   ?Extensive Assist Verbally Normal, Surgical wounds ?  ?  ?  ?    ?     ?     ? ? ?Personal Care Assistance Level of Assistance  ?Bathing, Dressing, Feeding Bathing Assistance: Limited assistance ?Feeding assistance: Independent ?Dressing Assistance: Limited assistance ?   ? ?Functional Limitations Info  ?    ?  ?   ? ? ?SPECIAL CARE FACTORS FREQUENCY  ?PT (By licensed PT), OT (By licensed OT)   ?  ?PT Frequency: 5 times per week ?OT Frequency: 5 times per week ?  ?  ?  ?   ? ? ?Contractures Contractures Info: Not present  ? ? ?Additional Factors Info  ?Code Status, Allergies Code Status Info: full code ?Allergies  Info: KNDA ?  ?  ?  ?   ? ?Current Medications (12/29/2021):  This is the current hospital active medication list ?Current Facility-Administered Medications  ?Medication Dose Route Frequency Provider Last Rate Last Admin  ? acetaminophen (TYLENOL) tablet 325-650 mg  325-650 mg Oral Q6H PRN Renee Harder, MD      ? alum & mag hydroxide-simeth (MAALOX/MYLANTA) 200-200-20 MG/5ML suspension 30 mL  30 mL Oral Q4H PRN Sharion Settler, NP   30 mL at 12/26/21 2254  ? brimonidine (ALPHAGAN) 0.2 % ophthalmic solution 1 drop  1 drop Both Eyes BID Renee Harder, MD   1 drop at 12/29/21 5397  ? docusate sodium (COLACE) capsule 100 mg  100 mg Oral BID Renee Harder, MD   100 mg at 12/29/21 6734  ? enoxaparin (LOVENOX) injection 40 mg  40 mg Subcutaneous Q24H Renee Harder, MD   40 mg at 12/28/21 2209  ? feeding supplement (ENSURE ENLIVE / ENSURE PLUS) liquid 237 mL  237 mL Oral BID BM Sharen Hones, MD   237 mL at 12/29/21 1058  ? HYDROcodone-acetaminophen (NORCO/VICODIN) 5-325 MG per tablet 1-2 tablet  1-2 tablet Oral Q6H PRN Renee Harder, MD   2 tablet at 12/29/21 1057  ? ipratropium-albuterol (DUONEB) 0.5-2.5 (3) MG/3ML nebulizer solution 3 mL  3 mL Nebulization Q6H PRN Renee Harder, MD   3 mL at 12/25/21 1818  ? latanoprost (XALATAN) 0.005 % ophthalmic solution 1 drop  1  drop Both Eyes QHS Renee Harder, MD   1 drop at 12/28/21 2212  ? menthol-cetylpyridinium (CEPACOL) lozenge 3 mg  1 lozenge Oral PRN Renee Harder, MD      ? Or  ? phenol (CHLORASEPTIC) mouth spray 1 spray  1 spray Mouth/Throat PRN Renee Harder, MD      ? metoCLOPramide (REGLAN) tablet 5-10 mg  5-10 mg Oral Q8H PRN Renee Harder, MD      ? Or  ? metoCLOPramide (REGLAN) injection 5-10 mg  5-10 mg Intravenous Q8H PRN Renee Harder, MD      ? morphine (PF) 2 MG/ML injection 0.5 mg  0.5 mg Intravenous Q2H PRN Renee Harder, MD   0.5 mg at 12/27/21 1918  ? multivitamin with minerals tablet 1 tablet  1 tablet Oral  Daily Sharen Hones, MD   1 tablet at 12/29/21 1017  ? ondansetron (ZOFRAN) tablet 4 mg  4 mg Oral Q6H PRN Renee Harder, MD      ? Or  ? ondansetron [facility]) injection 4 mg  4 mg Intravenous Q6H PRN Renee Harder, MD      ? sertraline (ZOLOFT) tablet 25 mg  25 mg Oral Daily Dallie Piles, RPH   25 mg at 12/29/21 5102  ? sodium chloride tablet 1 g  1 g Oral BID WC Sharen Hones, MD   1 g at 12/29/21 5852  ? ? ? ?Discharge Medications: ?Please see discharge summary for a list of discharge medications. ? ?Relevant Imaging Results: ? ?Relevant Lab Results: ? ? ?Additional Information ?SS# [id number] ? ?Conception Oms, RN ? ? ? ? ?

## 2021-12-29 NOTE — Progress Notes (Signed)
Nutrition Follow-up ? ?DOCUMENTATION CODES:  ? ?Not applicable ? ?INTERVENTION:  ? ?-Continue Ensure Enlive po BID, each supplement provides 350 kcal and 20 grams of protein ?-MVI with minerals daily ?-Liberalize diet to regular for wider variety of meal selections ? ?NUTRITION DIAGNOSIS:  ? ?Increased nutrient needs related to hip fracture, post-op healing as evidenced by estimated needs. ? ?Ongoing ? ?GOAL:  ? ?Patient will meet greater than or equal to 90% of their needs ? ?Progressing  ? ?MONITOR:  ? ?PO intake, Supplement acceptance ? ?REASON FOR ASSESSMENT:  ? ?Consult ?Hip fracture protocol ? ?ASSESSMENT:  ? ?87 y.o. female with medical history of interstitial lung disease. She presented to the ED after a fall at home and was subsequently found to have L hip fracture. ? ?5/6- s/p PROCEDURE: Left INTRAMEDULLARY (IM) NAIL INTERTROCHANTRIC/subtrochanteric ? ?Spoke with pt and family members at bedside. Pt reports decreases appetite post-operatively, but drank an Ensure and consumed some pineapple for breakfast. PTA, pt has a very good appetite. Per daughter, pt consumes a large breakfast and snacks on chips, crackers, and ice cream throughout the day. Noted meal completions 50-65%.  ? ?Per pt, she denies any weight loss.  ? ?Discussed importance of good meal and supplement intake to promote healing. Pt amenable to continue Ensure supplements. Per pt, she is also frustrated that she is unable to order favorite foods off menu; RD liberalized diet for wider variety of meal selections.  ? ?Medications reviewed and include vitamin B-12 and colace. ? ?Labs reviewed: Na: 129.  ? ?NUTRITION - FOCUSED PHYSICAL EXAM: ? ?Flowsheet Row Most Recent Value  ?Orbital Region No depletion  ?Upper Arm Region No depletion  ?Thoracic and Lumbar Region No depletion  ?Buccal Region No depletion  ?Temple Region No depletion  ?Clavicle Bone Region No depletion  ?Clavicle and Acromion Bone Region No depletion  ?Scapular Bone Region No  depletion  ?Dorsal Hand No depletion  ?Patellar Region No depletion  ?Anterior Thigh Region No depletion  ?Posterior Calf Region No depletion  ?Edema (RD Assessment) None  ?Hair Reviewed  ?Eyes Reviewed  ?Mouth Reviewed  ?Skin Reviewed  ?Nails Reviewed  ? ?  ? ? ?Diet Order:   ?Diet Order   ? ?       ?  Diet regular Room service appropriate? Yes; Fluid consistency: Thin  Diet effective now       ?  ? ?  ?  ? ?  ? ? ?EDUCATION NEEDS:  ? ?Education needs have been addressed ? ?Skin:  Skin Assessment: Skin Integrity Issues: ?Skin Integrity Issues:: Incisions ?Incisions: L hip (5/6) ? ?Last BM:  12/29/21 ? ?Height:  ? ?Ht Readings from Last 1 Encounters:  ?12/25/21 '5\' 4"'$  (1.626 m)  ? ? ?Weight:  ? ?Wt Readings from Last 1 Encounters:  ?12/25/21 73.8 kg  ? ? ?Ideal Body Weight:  54.54 kg ? ?BMI:  Body mass index is 27.93 kg/m?. ? ?Estimated Nutritional Needs:  ? ?Kcal:  1650-1850 ? ?Protein:  70-85 grams ? ?Fluid:  > 1.6 L ? ? ? ?Loistine Chance, RD, LDN, CDCES ?Registered Dietitian II ?Certified Diabetes Care and Education Specialist ?Please refer to [facility] for RD and/or RD on-call/weekend/after hours pager  ?

## 2021-12-29 NOTE — Progress Notes (Signed)
Physical Therapy Treatment ?Patient Details ?Name: Melanie Carrillo ?MRN: [id number] ?DOB: 03/16/1935 ?Today's Date: 12/29/2021 ? ? ?History of Present Illness Pt is an 87 y.o. female with medical history significant of interstitial lung disease and HTN who presented to the hospital s/p fall with L hip fracture.   Pt now s/p L femur IM nail on 12/26/21. ? ?  ?PT Comments  ? ? PM physical therapy session focused on improving activity tolerance during ambulation, BLE strengthening, and ascending and descending 1 step. Patient tolerated session well and was agreeable to treatment. Only mild pain reported throughout session. Patient continues to perform supine to sit transfers at [facility], however needs increased assistance with sit to supine and repositioning. Sit to stands from EOB and commode were completed CGA with RW, with increased assistance from hand rail in bathroom. [facility] Education on proper stair sequencing was completed, and patient was able to ascend/descend 1 step with RW and Min A. Patient was left in bed with all needs met and in reach with family present. Patient would continue to benefit from skilled physical therapy to optimize patients strength, muscular endurance, balance, and safety when performing ADLs. Continue to recommend STR upon discharge from acute hospitalization.  ?  ?Recommendations for follow up therapy are one component of a multi-disciplinary discharge planning process, led by the attending physician.  Recommendations may be updated based on patient status, additional functional criteria and insurance authorization. ? ?Follow Up Recommendations ? Skilled nursing-short term rehab (<3 hours/day) ?  ?  ?Assistance Recommended at Discharge Frequent or constant Supervision/Assistance  ?Patient can return home with the following A little help with walking and/or transfers;A little help with  bathing/dressing/bathroom;Assistance with cooking/housework;Help with stairs or ramp for entrance;Assist for transportation ?  ?Equipment Recommendations ? Rolling walker (2 wheels)  ?  ?Recommendations for Other Services   ? ? ?  ?Precautions / Restrictions Precautions ?Precautions: Fall ?Restrictions ?Weight Bearing Restrictions: Yes ?LLE Weight Bearing: Weight bearing as tolerated  ?  ? ?Mobility ? Bed Mobility ?Overal bed mobility: Needs Assistance ?Bed Mobility: Sit to Supine, Supine to Sit ?  ?  ?Supine to sit: Supervision ?Sit to supine: Min assist ?  ?General bed mobility comments: Assist with BLE to get back into bed ?Patient Response: Cooperative ? ?Transfers ?Overall transfer level: Needs assistance ?Equipment used: Rolling walker (2 wheels) ?Transfers: Sit to/from Stand ?Sit to Stand: Min guard ?  ?Step pivot transfers: Min guard ?  ?  ?  ?General transfer comment: good recall on hand placement ?  ? ?Ambulation/Gait ?Ambulation/Gait assistance: Min guard ?Gait Distance (Feet): 70 Feet ?Assistive device: Rolling walker (2 wheels) ?Gait Pattern/deviations: Step-to pattern, Antalgic, Decreased step length - right, Decreased stance time - left ?Gait velocity: decreased ?  ?  ?General Gait Details: Slow cadence with short step-to pattern, antalgic on the LLE but steady without LOB or buckling, no increase in L hip pain during functional ambulation ? ? ?Stairs ?Stairs: Yes ?Stairs assistance: Min assist ?Stair Management: No rails, Step to pattern, With walker ?Number of Stairs: 1 ?General stair comments: Patient educated on proper stair sequencing, mild unsteadiness during completion ? ? ?Wheelchair Mobility ?  ? ?Modified Rankin (Stroke Patients Only) ?  ? ? ?  ?Balance Overall balance assessment: Needs assistance ?Sitting-balance support: Feet supported ?Sitting balance-Leahy Scale: Good ?Sitting balance - Comments: steady static sitting, reaching within BOS. ?  ?Standing balance support: Bilateral upper  extremity supported, During  functional activity, Reliant on assistive device for balance ?Standing balance-Leahy Scale: Fair ?Standing balance comment: CGA during ambulation ?  ?  ?  ?  ?  ?  ?  ?  ?  ?  ?  ?  ? ?  ?Cognition Arousal/Alertness: Awake/alert ?Behavior During Therapy: [facility] for tasks assessed/performed ?Overall Cognitive Status: Within Functional Limits for tasks assessed ?  ?  ?  ?  ?  ?  ?  ?  ?  ?  ?  ?  ?  ?  ?  ?  ?  ?  ?  ? ?  ?Exercises Other Exercises ?Other Exercises: x10 supine heel slides B ?Other Exercises: x10 sit to stands from EOB with CGA and RW ? ?  ?General Comments   ?  ?  ? ?Pertinent Vitals/Pain Pain Assessment ?Pain Assessment: 0-10 ?Pain Score: 2  ?Faces Pain Scale: Hurts a little bit ?Pain Location: L hip ?Pain Descriptors / Indicators: Sore, Aching ?Pain Intervention(s): Monitored during session, Repositioned  ? ? ?Home Living   ?  ?  ?  ?  ?  ?  ?  ?  ?  ?   ?  ?Prior Function    ?  ?  ?   ? ?PT Goals (current goals can now be found in the care plan section) Acute Rehab PT Goals ?Patient Stated Goal: To walk better ?PT Goal Formulation: With patient ?Time For Goal Achievement: 01/09/22 ?Potential to Achieve Goals: Good ?Progress towards PT goals: Progressing toward goals ? ?  ?Frequency ? ? ? BID ? ? ? ?  ?PT Plan Current plan remains appropriate  ? ? ?Co-evaluation   ?  ?  ?  ?  ? ?  ?AM-PAC PT "6 Clicks" Mobility   ?Outcome Measure ? Help needed turning from your back to your side while in a flat bed without using bedrails?: A Little ?Help needed moving from lying on your back to sitting on the side of a flat bed without using bedrails?: A Little ?Help needed moving to and from a bed to a chair (including a wheelchair)?: A Little ?Help needed standing up from a chair using your arms (e.g., wheelchair or bedside chair)?: A Little ?Help needed to walk in hospital room?: A Little ?Help needed climbing 3-5 steps with a railing? : A Lot ?6 Click Score: 17 ? ?  ?End of Session  Equipment Utilized During Treatment: Gait belt ?Activity Tolerance: Patient tolerated treatment well ?Patient left: in bed;with call bell/phone within reach;with bed alarm set;with family/visitor present ?Nurse Communication: Mobility status ?PT Visit Diagnosis: Other abnormalities of gait and mobility (R26.89);Muscle weakness (generalized) (M62.81);Pain ?Pain - Right/Left: Left ?Pain - part of body: Hip ?  ? ? ?Time: 1700-1749 ?PT Time Calculation (min) (ACUTE ONLY): 25 min ? ?Charges:  $Gait Training: 8-22 mins ?$Therapeutic Activity: 8-22 mins          ?          ? ?Iva Boop, PT  ?12/29/21. 2:04 PM ? ? ?

## 2021-12-29 NOTE — TOC Progression Note (Signed)
Transition of Care (TOC) - Progression Note  ? ? ?Patient Details  ?Name: Melanie Carrillo ?MRN: [id number] ?Date of Birth: 01-28-1935 ? ?Transition of Care (TOC) CM/SW Contact  ?Conception Oms, RN ?Phone Number: ?12/29/2021, 2:34 PM ? ?Clinical Narrative:    ? ?Met with the patient and her daughter in the room and reviewed the bed options, [facility] chose [facility], I notified [facility] at [facility] ?[facility], [facility] ? ?Expected Discharge Plan: [place] ?Barriers to Discharge: Continued Medical Work up ? ?Expected Discharge Plan and Services ?Expected Discharge Plan: [place] ?  ?  ?  ?  ?                ?  ?  ?  ?  ?  ?  ?  ?  ?  ?  ? ? ?Social Determinants of Health (SDOH) Interventions ?  ? ?Readmission Risk Interventions ?   ? View : No data to display.  ?  ?  ?  ? ? ?

## 2021-12-29 NOTE — Progress Notes (Signed)
?  Progress Note ? ? ?Patient: Melanie Carrillo:[id number] DOB: 1935-07-04 DOA: 12/25/2021     4 ?DOS: the patient was seen and examined on 12/29/2021 ?  ?Brief hospital course: ?Melanie Carrillo is a 87 y.o. female with medical history significant of interstitial lung disease who present to the hospital with hip fracture. ?Patient currently lives alone at home, she has interstitial lung disease, she has some short of breath with exertion and a cough.  But no hypoxia.  She went to the bathroom last night, tripped and fell on the ground.  She sustained a fracture in her left hip.  ?Left intramedullary nail performed on 5/6. ?Hemoglobin dropped down to 7.0, transfuse 1 unit PRBC ? ?Assessment and Plan: ?* Hip fracture ([place]) ?Status post surgery.   ?Seen by PT/OT, currently pending nursing home placement. ? ?Acute postoperative anemia due to expected blood loss ?Patient received 1 unit of PRBC.  Iron level normal.  B12 level borderline, still pending homocystine level.  Received a B12 shot yesterday, will give additional dose today. ? ?Elevated troponin ?Demand ischemia from hip fracture. ? 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 ?Continue salt tablets.  Patient is not drinking excessive water while in the hospital ? ?Rhabdomyolysis ?From fall, condition improved. ? ?Interstitial lung disease ([place]) ?Stable, continue as needed DuoNeb. ? ? ? ? ?  ? ?Subjective:  ?Patient doing well today, but still has significant weakness.  Pain under control ? ?Physical Exam: ?Vitals:  ? 12/28/21 1517 12/28/21 2208 12/29/21 0321 12/29/21 0755  ?BP: (!) 156/62 (!) 155/61 (!) 154/63 140/69  ?Pulse: 95 94 86 82  ?Resp: '18 18 18 17  '$ ?Temp: 98.2 ?F (36.8 ?C) 98.1 ?F (36.7 ?C) 98.1 ?F (36.7 ?C) 98.1 ?F (36.7 ?C)  ?TempSrc: Oral Oral Oral   ?SpO2: 99% 94% 95% 95%  ?Weight:      ?Height:      ? ?General exam: Appears calm and comfortable  ?Respiratory system: Clear to auscultation. Respiratory effort normal. ?Cardiovascular  system: S1 & S2 heard, RRR. No JVD, murmurs, rubs, gallops or clicks. No pedal edema. ?Gastrointestinal system: Abdomen is nondistended, soft and nontender. No organomegaly or masses felt. Normal bowel sounds heard. ?Central nervous system: Alert and oriented. No focal neurological deficits. ?Extremities: Symmetric 5 x 5 power. ?Skin: No rashes, lesions or ulcers ?Psychiatry: Judgement and insight appear normal. Mood & affect appropriate.  ? ?Data Reviewed: ? ?Lab results reviewed. ? ?Family Communication: daughter updated ? ?Disposition: ?Status is: Inpatient ?Remains inpatient appropriate because: Unsafe discharge, pending nursing home placement. ? Planned Discharge Destination: Skilled nursing facility ? ? ? ?Time spent: 28 minutes ? ?Author: ?Sharen Hones, MD ?12/29/2021 11:47 AM ? ?For on call review [email or web address].  ?

## 2021-12-29 NOTE — Progress Notes (Signed)
Physical Therapy Treatment ?Patient Details ?Name: Melanie Carrillo ?MRN: [id number] ?DOB: Jan 14, 1935 ?Today's Date: 12/29/2021 ? ? ?History of Present Illness Pt is an 87 y.o. female with medical history significant of interstitial lung disease and HTN who presented to the hospital s/p fall with L hip fracture.   Pt now s/p L femur IM nail on 12/26/21. ? ?  ?PT Comments  ? ? Physical Therapy session completed this date. Patient tolerated session well and was agreeable to treatment. Upon entry into room patient was laying in bed with family present, and requesting to use the bathroom. Patient was able to complete supine to sit and step pivot transfer to [facility] at SUP-CGA. Max A required for pericare. Patient was then able to demonstrate increased distance during ambulation bout, completing ~80 feet. Patient required Min A to complete sit to supine transfer, and was left with all needs met with family present. Patient would continue to benefit from skilled physical therapy in order to optimize patient's return to PLOF. Continue to recommend STR upon discharge from acute hospitalization.   ?Recommendations for follow up therapy are one component of a multi-disciplinary discharge planning process, led by the attending physician.  Recommendations may be updated based on patient status, additional functional criteria and insurance authorization. ? ?Follow Up Recommendations ? Skilled nursing-short term rehab (<3 hours/day) ?  ?  ?Assistance Recommended at Discharge Frequent or constant Supervision/Assistance  ?Patient can return home with the following A little help with walking and/or transfers;A little help with bathing/dressing/bathroom;Assistance with cooking/housework;Help with stairs or ramp for entrance;Assist for transportation ?  ?Equipment Recommendations ? Rolling walker (2 wheels)  ?  ?Recommendations for Other Services   ? ? ?  ?Precautions / Restrictions Precautions ?Precautions: Fall ?Restrictions ?Weight Bearing  Restrictions: Yes ?LLE Weight Bearing: Weight bearing as tolerated  ?  ? ?Mobility ? Bed Mobility ?Overal bed mobility: Needs Assistance ?Bed Mobility: Sit to Supine, Supine to Sit ?  ?  ?Supine to sit: Supervision ?Sit to supine: Min assist ?  ?General bed mobility comments: Assist with BLE to get back into bed ?  ? ?Transfers ?Overall transfer level: Needs assistance ?Equipment used: Rolling walker (2 wheels) ?Transfers: Sit to/from Stand, Bed to chair/wheelchair/BSC ?Sit to Stand: Min guard ?  ?Step pivot transfers: Min guard ?  ?  ?  ?General transfer comment: good recall on hand placement ?  ? ?Ambulation/Gait ?Ambulation/Gait assistance: Min guard ?Gait Distance (Feet): 64 Feet ?Assistive device: Rolling walker (2 wheels) ?Gait Pattern/deviations: Step-to pattern, Antalgic, Decreased step length - right, Decreased stance time - left ?Gait velocity: decreased ?  ?  ?General Gait Details: Slow cadence with short step-to pattern, antalgic on the LLE but steady without LOB or buckling, no increase in L hip pain during functional ambulation ? ? ?Stairs ?  ?  ?  ?  ?  ? ? ?Wheelchair Mobility ?  ? ?Modified Rankin (Stroke Patients Only) ?  ? ? ?  ?Balance Overall balance assessment: Needs assistance ?  ?Sitting balance-Leahy Scale: Good ?Sitting balance - Comments: steady static sitting, reaching within BOS. ?  ?Standing balance support: Bilateral upper extremity supported, During functional activity, Reliant on assistive device for balance ?Standing balance-Leahy Scale: Fair ?  ?  ?  ?  ?  ?  ?  ?  ?  ?  ?  ?  ?  ? ?  ?Cognition Arousal/Alertness: Awake/alert ?Behavior During Therapy: [facility] for tasks assessed/performed ?Overall Cognitive Status: Within Functional Limits for tasks assessed ?  ?  ?  ?  ?  ?  ?  ?  ?  ?  ?  ?  ?  ?  ?  ?  ?  ?  ?  ? ?  ?  Exercises   ? ?  ?General Comments   ?  ?  ? ?Pertinent Vitals/Pain Pain Assessment ?Pain Assessment: 0-10 ?Pain Score: 2  ?Faces Pain Scale: Hurts a little bit ?Pain  Location: L hip ?Pain Descriptors / Indicators: Sore, Aching ?Pain Intervention(s): Monitored during session, Repositioned  ? ? ?Home Living   ?  ?  ?  ?  ?  ?  ?  ?  ?  ?   ?  ?Prior Function    ?  ?  ?   ? ?PT Goals (current goals can now be found in the care plan section) Acute Rehab PT Goals ?Patient Stated Goal: To walk better ?PT Goal Formulation: With patient ?Time For Goal Achievement: 01/09/22 ?Potential to Achieve Goals: Good ?Progress towards PT goals: Progressing toward goals ? ?  ?Frequency ? ? ? BID ? ? ? ?  ?PT Plan Current plan remains appropriate  ? ? ?Co-evaluation   ?  ?  ?  ?  ? ?  ?AM-PAC PT "6 Clicks" Mobility   ?Outcome Measure ? Help needed turning from your back to your side while in a flat bed without using bedrails?: A Little ?Help needed moving from lying on your back to sitting on the side of a flat bed without using bedrails?: A Little ?Help needed moving to and from a bed to a chair (including a wheelchair)?: A Little ?Help needed standing up from a chair using your arms (e.g., wheelchair or bedside chair)?: A Little ?Help needed to walk in hospital room?: A Little ?Help needed climbing 3-5 steps with a railing? : A Lot ?6 Click Score: 17 ? ?  ?End of Session Equipment Utilized During Treatment: Gait belt ?Activity Tolerance: Patient tolerated treatment well ?Patient left: in bed;with call bell/phone within reach;with bed alarm set;with family/visitor present ?Nurse Communication: Mobility status ?PT Visit Diagnosis: Other abnormalities of gait and mobility (R26.89);Muscle weakness (generalized) (M62.81);Pain ?Pain - Right/Left: Left ?Pain - part of body: Hip ?  ? ? ?Time: 1884-1660 ?PT Time Calculation (min) (ACUTE ONLY): 18 min ? ?Charges:  $Gait Training: 8-22 mins          ?          ? ?Iva Boop, PT  ?12/29/21. 11:23 AM ? ? ?

## 2021-12-30 DIAGNOSIS — E871 Hypo-osmolality and hyponatremia: Secondary | ICD-10-CM | POA: Diagnosis not present

## 2021-12-30 DIAGNOSIS — D62 Acute posthemorrhagic anemia: Secondary | ICD-10-CM | POA: Diagnosis not present

## 2021-12-30 DIAGNOSIS — R778 Other specified abnormalities of plasma proteins: Secondary | ICD-10-CM | POA: Diagnosis not present

## 2021-12-30 DIAGNOSIS — S72002A Fracture of unspecified part of neck of left femur, initial encounter for closed fracture: Secondary | ICD-10-CM | POA: Diagnosis not present

## 2021-12-30 LAB — CBC
HCT: 24.9 % — ABNORMAL LOW (ref 36.0–46.0)
Hemoglobin: 8.2 g/dL — ABNORMAL LOW (ref 12.0–15.0)
MCH: 29.7 pg (ref 26.0–34.0)
MCHC: 32.9 g/dL (ref 30.0–36.0)
MCV: 90.2 fL (ref 80.0–100.0)
Platelets: 216 10*3/uL (ref 150–400)
RBC: 2.76 MIL/uL — ABNORMAL LOW (ref 3.87–5.11)
WBC: 8.6 10*3/uL (ref 4.0–10.5)
nRBC: 0.5 % — ABNORMAL HIGH (ref 0.0–0.2)

## 2021-12-30 LAB — BASIC METABOLIC PANEL
Anion gap: 3 — ABNORMAL LOW (ref 5–15)
BUN: 9 mg/dL (ref 8–23)
CO2: 28 mmol/L (ref 22–32)
Calcium: 8.8 mg/dL — ABNORMAL LOW (ref 8.9–10.3)
Chloride: 99 mmol/L (ref 98–111)
Creatinine, Ser: 0.42 mg/dL — ABNORMAL LOW (ref 0.44–1.00)
GFR, Estimated: >60 mL/min (ref >60)
Glucose, Bld: 114 mg/dL — ABNORMAL HIGH (ref 70–99)
Potassium: 3.9 mmol/L (ref 3.5–5.1)
Sodium: 130 mmol/L — ABNORMAL LOW (ref 135–145)

## 2021-12-30 NOTE — Plan of Care (Signed)

## 2021-12-30 NOTE — Progress Notes (Addendum)
? ?PROGRESS NOTE ? ? ? ?Melanie Carrillo  CLE:[id number] DOB: 1935/05/29  ?DOA: 12/25/2021 ?Date of Service: Today '@TODAY'$ @ which is Hospital Day 5 ?PCP: Gayland Curry, MD ? ? ? ? ?Brief Narrative & Hospital Course:  ?Patient is 87 year old female, PMH interstitial lung disease, presented to hospital with hip fracture.  Lives alone, reports SOB with exertion, chronic cough, no hypoxia.  Mechanical fall day prior to arrival in ED, sustained fracture to left hip, left intramedullary nail performed on 12/26/2021.  Hemoglobin dropped down to 7.0, has remained stable post 1 unit PRBC.  As of 12/30/2021, awaiting SNF placement ? ?Consultants:  ?Orthopedic surgery ? ?Procedures: ?Orthoepdics re fracture: left intramedullary nail performed on 12/26/2021 ? ? ? ?Subjective: ?Patient reports doing well today, pain is controlled, no concerns.  Daughter is present at bedside, no additional questions or concerns from her. ? ? ? ? ? ? ?ASSESSMENT & PLAN: ?  ?Principal Problem: ?  Hip fracture ([name]) ?Active Problems: ?  Interstitial lung disease ([name]) ?  Rhabdomyolysis ?  Hyponatremia ?  Elevated troponin ?  Acute postoperative anemia due to expected blood loss ? ? ?Rhabdomyolysis ?From fall, condition improved. ? ?Interstitial lung disease ([name]) ?Stable, continue as needed DuoNeb. ? 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 Sodium has remained stable ?Continue salt tablets.  Patient is not drinking excessive water while in the hospital ?Will recheck BMP, Osm (serum and urine), urine sodium in AM ? ?Hip fracture ([name]) ?Status post surgery.   ?Seen by PT/OT, currently pending nursing home placement. ? ?Elevated troponin ?Demand ischemia from hip fracture. ? ?Acute postoperative anemia due to expected blood loss ?Patient received 1 unit of PRBC.  Iron level normal.  B12 level borderline, homocystine WNL.  Received a B12 shot x2 (2 days ago and yesterday) ? ? ? ?DVT prophylaxis: lovenox ?Code Status: FULL ?Family  Communication: daughter at bedside on rounds  ?Disposition Plan: SNF pending  ? ? ? ? ? ?Objective: ?Vitals:  ? 12/29/21 2339 12/30/21 0424 12/30/21 0729 12/30/21 1128  ?BP: 108/68 107/75 109/79 (!) 143/59  ?Pulse: (!) 101 94 87 90  ?Resp: '20  18 16  '$ ?Temp: 99 ?F (37.2 ?C) 99 ?F (37.2 ?C) 98.1 ?F (36.7 ?C) 97.8 ?F (36.6 ?C)  ?TempSrc: Oral Oral Oral   ?SpO2: 96% 93% 96% 94%  ?Weight:      ?Height:      ? ? ?Intake/Output Summary (Last 24 hours) at 12/30/2021 1316 ?Last data filed at 12/30/2021 0907 ?Gross per 24 hour  ?Intake 360 ml  ?Output --  ?Net 360 ml  ? ?Filed Weights  ? 12/25/21 1422  ?Weight: 73.8 kg  ? ? ?Examination: ? ?General exam: Appears calm and comfortable  ?Respiratory system: Clear to auscultation. Respiratory effort normal. ?Cardiovascular system: S1 & S2 heard, RRR. No pedal edema. ?Gastrointestinal system: Abdomen is nondistended, soft and nontender.  ?Central nervous system: Alert and oriented. No focal neurological deficits. ?Extremities: Symmetric 5 x 5 power. ?Psychiatry: Judgement and insight appear normal. Mood & affect appropriate.  ? ? ? ? ? ? ?Scheduled Medications:  ? brimonidine  1 drop Both Eyes BID  ? docusate sodium  100 mg Oral BID  ? enoxaparin (LOVENOX) injection  40 mg Subcutaneous Q24H  ? feeding supplement  237 mL Oral BID BM  ? latanoprost  1 drop Both Eyes QHS  ? multivitamin with minerals  1 tablet Oral Daily  ? sertraline  25 mg Oral Daily  ? sodium chloride  1 g Oral BID WC  ? ? ?Continuous Infusions: ? ? ?PRN Medications:  ?acetaminophen, alum & mag hydroxide-simeth, HYDROcodone-acetaminophen, ipratropium-albuterol, menthol-cetylpyridinium **OR** phenol, metoCLOPramide **OR** metoCLOPramide (REGLAN) injection, morphine injection, ondansetron **OR** ondansetron (ZOFRAN) IV ? ?Antimicrobials:  ?Anti-infectives (From admission, onward)  ? ? Start     Dose/Rate Route Frequency Ordered Stop  ? 12/26/21 2000  ceFAZolin (ANCEF) IVPB 2g/100 mL premix       ? 2 g ?200 mL/hr over  30 Minutes Intravenous Every 8 hours 12/26/21 1622 12/27/21 1517  ? 12/26/21 1213  gentamicin 80 mg in 0.9% sodium chloride 250 mL irrigation  Status:  Discontinued       ?   As needed 12/26/21 1213 12/26/21 1434  ? 12/26/21 1100  ceFAZolin (ANCEF) IVPB 2g/100 mL premix       ? 2 g ?200 mL/hr over 30 Minutes Intravenous To Surgery 12/25/21 1700 12/26/21 1218  ? ?  ? ? ?Data Reviewed: I have personally reviewed following labs and imaging studies ? ?CBC: ?Recent Labs  ?Lab 12/25/21 ?1418 12/25/21 ?1618 12/26/21 ?0998 12/27/21 ?0431 12/27/21 ?1353 12/28/21 ?3382 12/29/21 ?0354 12/30/21 ?0357  ?WBC 17.8*   < > 14.6* 11.6*  --  8.0 9.2 8.6  ?NEUTROABS 14.3*  --   --   --   --   --   --   --   ?HGB 12.3   < > 10.4* 7.5* 9.1* 7.0* 8.3* 8.2*  ?HCT 35.9*   < > 30.5* 22.6*  --  21.5* 25.3* 24.9*  ?MCV 96.2   < > 99.0 99.6  --  100.9* 90.0 90.2  ?PLT 258   < > 186 155  --  163 172 216  ? < > = values in this interval not displayed.  ? ?Basic Metabolic Panel: ?Recent Labs  ?Lab 12/26/21 ?5053 12/27/21 ?0431 12/28/21 ?9767 12/29/21 ?0354 12/30/21 ?0357  ?NA 129* 130* 131* 129* 130*  ?K 4.9 4.5 3.9 3.9 3.9  ?CL 99 102 101 99 99  ?CO2 '23 25 26 26 28  '$ ?GLUCOSE 119* 121* 97 107* 114*  ?BUN 27* '19 13 11 9  '$ ?CREATININE 0.64 0.51 0.41* 0.48 0.42*  ?CALCIUM 8.6* 8.5* 8.5* 8.6* 8.8*  ?MG 2.1 2.2 2.0  --   --   ? ?GFR: ?Estimated Creatinine Clearance: 51.5 mL/min (A) (by C-G formula based on SCr of 0.42 mg/dL (L)). ?Liver Function Tests: ?Recent Labs  ?Lab 12/25/21 ?1418  ?AST 82*  ?ALT 32  ?ALKPHOS 44  ?BILITOT 1.2  ?PROT 7.0  ?ALBUMIN 3.8  ? ?No results for input(s): LIPASE, AMYLASE in the last 168 hours. ?No results for input(s): AMMONIA in the last 168 hours. ?Coagulation Profile: ?No results for input(s): INR, PROTIME in the last 168 hours. ?Cardiac Enzymes: ?Recent Labs  ?Lab 12/25/21 ?1418 12/26/21 ?3419 12/27/21 ?0431 12/28/21 ?3790  ?CKTOTAL 2,655* 1,310* 1,087* 925*  ? ?BNP (last 3 results) ?No results for input(s): PROBNP in  the last 8760 hours. ?HbA1C: ?No results for input(s): HGBA1C in the last 72 hours. ?CBG: ?No results for input(s): GLUCAP in the last 168 hours. ?Lipid Profile: ?No results for input(s): CHOL, HDL, LDLCALC, TRIG, CHOLHDL, LDLDIRECT in the last 72 hours. ?Thyroid Function Tests: ?No results for input(s): TSH, T4TOTAL, FREET4, T3FREE, THYROIDAB in the last 72 hours. ?Anemia Panel: ?No results for input(s): VITAMINB12, FOLATE, FERRITIN, TIBC, IRON, RETICCTPCT in the last 72 hours. ?Urine analysis: ?   ?Component Value Date/Time  ? COLORURINE YELLOW (A) 12/25/2021 1418  ? APPEARANCEUR  CLEAR (A) 12/25/2021 1418  ? LABSPEC 1.019 12/25/2021 1418  ? PHURINE 5.0 12/25/2021 1418  ? GLUCOSEU 50 (A) 12/25/2021 1418  ? HGBUR SMALL (A) 12/25/2021 1418  ? [place] NEGATIVE 12/25/2021 1418  ? [place] NEGATIVE 12/25/2021 1418  ? PROTEINUR 30 (A) 12/25/2021 1418  ? NITRITE NEGATIVE 12/25/2021 1418  ? LEUKOCYTESUR TRACE (A) 12/25/2021 1418  ? ?Sepsis Labs: ?'@LABRCNTIP'$ (procalcitonin:4,lacticidven:4) ? ?Recent Results (from the past 240 hour(s))  ?Resp Panel by RT-PCR (Flu A&B, Covid) Nasopharyngeal Swab     Status: None  ? Collection Time: 12/26/21  8:42 AM  ? Specimen: Nasopharyngeal Swab; Nasopharyngeal(NP) swabs in vial transport medium  ?Result Value Ref Range Status  ? SARS Coronavirus 2 by RT PCR NEGATIVE NEGATIVE Final  ?  Comment: (NOTE) ?SARS-CoV-2 target nucleic acids are NOT DETECTED. ? ?The SARS-CoV-2 RNA is generally detectable in upper respiratory ?specimens during the acute phase of infection. The lowest ?concentration of SARS-CoV-2 viral copies this assay can detect is ?138 copies/mL. A negative result does not preclude SARS-Cov-2 ?infection and should not be used as the sole basis for treatment or ?other patient management decisions. A negative result may occur with  ?improper specimen collection/handling, submission of specimen other ?than nasopharyngeal swab, presence of viral mutation(s) within the ?areas  targeted by this assay, and inadequate number of viral ?copies(<138 copies/mL). A negative result must be combined with ?clinical observations, patient history, and epidemiological ?information. The expected result

## 2021-12-30 NOTE — Progress Notes (Signed)
Physical Therapy Treatment ?Patient Details ?Name: Melanie Carrillo ?MRN: [id number] ?DOB: 13-May-1935 ?Today's Date: 12/30/2021 ? ? ?History of Present Illness Pt is an 87 y.o. female with medical history significant of interstitial lung disease and HTN who presented to the hospital s/p fall with L hip fracture.   Pt now s/p L femur IM nail on 12/26/21. ? ?  ?PT Comments  ? ? Physical Therapy PM session completed this date. Patient tolerated session well and was agreeable to treatment. Upon arrival patient was supine in bed with daughter present and only reporting mild pain. Patient requested to go to the bathroom before any activity, and was able to complete supine to sit, sit to stands (from EOB and commode) as well as ambulation from EOB>bathroom at CGA/SUP. Patient continues to require MAX A for pericare. Patient required a short seated rest break at EOB before ambulating in the hall due to fatigue. After seated rest break, patient completed 10 additional sit to stand reps at [facility] for BLE strengthening. Patient ambulated ~100 feet with short standing rest breaks for fatigue throughout ambulation bout at CGA/SBA with RW. Patient was left in bed with all need met and in reach with family present. Patient requested pain medication with RN present in room at conclusion of session. Patient is progressing towards her goals and would continue to benefit from skilled physical therapy in order to optimize patient's return to PLOF. Continue to recommend STR upon discharge from acute hospitalization.  ?  ?Recommendations for follow up therapy are one component of a multi-disciplinary discharge planning process, led by the attending physician.  Recommendations may be updated based on patient status, additional functional criteria and insurance authorization. ? ?Follow Up Recommendations ? Skilled nursing-short term rehab (<3 hours/day) ?  ?  ?Assistance Recommended at Discharge Frequent or constant Supervision/Assistance  ?Patient  can return home with the following A little help with walking and/or transfers;A little help with bathing/dressing/bathroom;Assistance with cooking/housework;Help with stairs or ramp for entrance;Assist for transportation ?  ?Equipment Recommendations ? Rolling walker (2 wheels)  ?  ?Recommendations for Other Services   ? ? ?  ?Precautions / Restrictions Precautions ?Precautions: Fall ?Restrictions ?Weight Bearing Restrictions: Yes ?LLE Weight Bearing: Weight bearing as tolerated  ?  ? ?Mobility ? Bed Mobility ?Overal bed mobility: Needs Assistance ?Bed Mobility: Sit to Supine, Supine to Sit ?  ?  ?Supine to sit: Supervision ?Sit to supine: Min assist ?  ?General bed mobility comments: Assist with LLE to get back into bed ?Patient Response: Cooperative ? ?Transfers ?Overall transfer level: Needs assistance ?Equipment used: Rolling walker (2 wheels) ?Transfers: Sit to/from Stand ?Sit to Stand: Supervision ?  ?  ?  ?  ?  ?General transfer comment: good recall on hand placement ?  ? ?Ambulation/Gait ?Ambulation/Gait assistance: Min guard (SBA) ?Gait Distance (Feet): 100 Feet ?Assistive device: Rolling walker (2 wheels) ?Gait Pattern/deviations: Step-to pattern, Antalgic, Decreased step length - right, Decreased stance time - left ?Gait velocity: decreased ?  ?  ?General Gait Details: Slow cadence with short step-to pattern, antalgic on the LLE but steady without LOB or buckling, no increase in L hip pain during functional ambulation, patient continues to demonstrate decreased activity tolerance with ambulation, fatiguing quickly ? ? ?Stairs ?  ?  ?  ?  ?  ? ? ?Wheelchair Mobility ?  ? ?Modified Rankin (Stroke Patients Only) ?  ? ? ?  ?Balance Overall balance assessment: Needs assistance ?Sitting-balance support: Feet supported ?Sitting balance-Leahy Scale: Good ?Sitting balance -  Comments: steady static sitting, reaching within BOS. ?  ?Standing balance support: Bilateral upper extremity supported, During functional  activity, Reliant on assistive device for balance ?Standing balance-Leahy Scale: Fair ?Standing balance comment: CGA/SBA with ambulation ?  ?  ?  ?  ?  ?  ?  ?  ?  ?  ?  ?  ? ?  ?Cognition Arousal/Alertness: Awake/alert ?Behavior During Therapy: [facility] for tasks assessed/performed ?Overall Cognitive Status: Within Functional Limits for tasks assessed ?  ?  ?  ?  ?  ?  ?  ?  ?  ?  ?  ?  ?  ?  ?  ?  ?  ?  ?  ? ?  ?Exercises Other Exercises ?Other Exercises: x20 supine AP ?Other Exercises: x10 sit to stands from EOB with SBA and RW ? ?  ?General Comments General comments (skin integrity, edema, etc.): SpO2 remained >90% throughout session on room air, HR 120bpm after ambulation ?  ?  ? ?Pertinent Vitals/Pain Pain Assessment ?Pain Score: 7  ?Faces Pain Scale: Hurts a little bit ?Pain Location: L hip ?Pain Descriptors / Indicators: Sore, Aching ?Pain Intervention(s): Monitored during session, Patient requesting pain meds-RN notified  ? ? ?Home Living   ?  ?  ?  ?  ?  ?  ?  ?  ?  ?   ?  ?Prior Function    ?  ?  ?   ? ?PT Goals (current goals can now be found in the care plan section) Acute Rehab PT Goals ?Patient Stated Goal: To walk better ?PT Goal Formulation: With patient ?Time For Goal Achievement: 01/09/22 ?Potential to Achieve Goals: Good ?Progress towards PT goals: Progressing toward goals ? ?  ?Frequency ? ? ? BID ? ? ? ?  ?PT Plan Current plan remains appropriate  ? ? ?Co-evaluation   ?  ?  ?  ?  ? ?  ?AM-PAC PT "6 Clicks" Mobility   ?Outcome Measure ? Help needed turning from your back to your side while in a flat bed without using bedrails?: A Little ?Help needed moving from lying on your back to sitting on the side of a flat bed without using bedrails?: A Little ?Help needed moving to and from a bed to a chair (including a wheelchair)?: A Little ?Help needed standing up from a chair using your arms (e.g., wheelchair or bedside chair)?: A Little ?Help needed to walk in hospital room?: A Little ?Help needed  climbing 3-5 steps with a railing? : A Lot ?6 Click Score: 17 ? ?  ?End of Session Equipment Utilized During Treatment: Gait belt ?Activity Tolerance: Patient tolerated treatment well;Patient limited by fatigue ?Patient left: in bed;with call bell/phone within reach;with bed alarm set;with family/visitor present ?Nurse Communication: Mobility status (RN in room when patient requested pain medication) ?PT Visit Diagnosis: Other abnormalities of gait and mobility (R26.89);Muscle weakness (generalized) (M62.81);Pain ?Pain - Right/Left: Left ?Pain - part of body: Hip ?  ? ? ?Time: 8115-7262 ?PT Time Calculation (min) (ACUTE ONLY): 23 min ? ?Charges:  $Gait Training: 8-22 mins ?$Self Care/Home Management: 8-22          ?          ? ?Iva Boop, PT  ?12/30/21. 3:13 PM ? ?

## 2021-12-30 NOTE — Progress Notes (Signed)
Physical Therapy Treatment ?Patient Details ?Name: Melanie Carrillo ?MRN: [id number] ?DOB: [birth year] ?Today's Date: 12/30/2021 ? ? ?History of Present Illness Pt is an 87 y.o. female with medical history significant of interstitial lung disease and HTN who presented to the hospital s/p fall with L hip fracture.   Pt now s/p L femur IM nail on 12/26/21. ? ?  ?PT Comments  ? ? Physical therapy treatment completed this date. Patient tolerated session well and was agreeable to treatment. Patient reported a 7/10 pain in the L hip, however pain did not limit participation in therapy. Patient is progressing nicely towards her goals, however continues to demonstrate impaired balance, strength, and activity tolerance, decreasing patient;s ability to perform ADLs safety. Increased ambulation bout of ~100 feet completed today with CGA/SBA for safety and balance concerns. Min A was required to complete sit to supine transfer due to slight increase in pain. Max A is required for percare in standing. Patient was left with all needs met and in reach, with patient requesting pain medication at end of session. Patient would continue to benefit from skilled physical therapy in order to optimize patient's return to PLOF. Continue to recommend STR upon discharge from acute hospitalization.  ?  ?Recommendations for follow up therapy are one component of a multi-disciplinary discharge planning process, led by the attending physician.  Recommendations may be updated based on patient status, additional functional criteria and insurance authorization. ? ?Follow Up Recommendations ? Skilled nursing-short term rehab (<3 hours/day) ?  ?  ?Assistance Recommended at Discharge Frequent or constant Supervision/Assistance  ?Patient can return home with the following A little help with walking and/or transfers;A little help with bathing/dressing/bathroom;Assistance with cooking/housework;Help with stairs or ramp for entrance;Assist for transportation ?   ?Equipment Recommendations ? Rolling walker (2 wheels)  ?  ?Recommendations for Other Services   ? ? ?  ?Precautions / Restrictions Precautions ?Precautions: Fall ?Restrictions ?Weight Bearing Restrictions: Yes ?LLE Weight Bearing: Weight bearing as tolerated  ?  ? ?Mobility ? Bed Mobility ?Overal bed mobility: Needs Assistance ?Bed Mobility: Sit to Supine ?  ?  ?  ?Sit to supine: Min assist (minimal assist at BLEs due to increased pain at end of session) ?  ?  ?Patient Response: Cooperative ? ?Transfers ?Overall transfer level: Needs assistance ?Equipment used: Rolling walker (2 wheels) ?Transfers: Sit to/from Stand (from recliner and [facility]) ?Sit to Stand: Supervision (SBA) ?  ?  ?  ?  ?  ?General transfer comment: good recall on hand placement ?  ? ?Ambulation/Gait ?Ambulation/Gait assistance: Min guard (SBA) ?Gait Distance (Feet): 100 Feet ?Assistive device: Rolling walker (2 wheels) ?Gait Pattern/deviations: Step-to pattern, Antalgic, Decreased step length - right, Decreased stance time - left ?Gait velocity: decreased ?  ?  ?General Gait Details: Slow cadence with short step-to pattern, antalgic on the LLE but steady without LOB or buckling, no increase in L hip pain during functional ambulation, patient continues to demonstrate decreased activity tolerance with ambulation, fatiguing quickly ? ? ?Stairs ?  ?  ?  ?  ?  ? ? ?Wheelchair Mobility ?  ? ?Modified Rankin (Stroke Patients Only) ?  ? ? ?  ?Balance Overall balance assessment: Needs assistance ?Sitting-balance support: Feet supported ?Sitting balance-Leahy Scale: Good ?Sitting balance - Comments: steady static sitting, reaching within BOS. ?  ?Standing balance support: Bilateral upper extremity supported, During functional activity, Reliant on assistive device for balance ?Standing balance-Leahy Scale: Fair ?Standing balance comment: CGA/SBA with ambulation ?  ?  ?  ?  ?  ?  ?  ?  ?  ?  ?  ?  ? ?  ?  Cognition Arousal/Alertness: Awake/alert ?Behavior During  Therapy: [facility] for tasks assessed/performed ?Overall Cognitive Status: Within Functional Limits for tasks assessed ?  ?  ?  ?  ?  ?  ?  ?  ?  ?  ?  ?  ?  ?  ?  ?  ?  ?  ?  ? ?  ?Exercises   ? ?  ?General Comments   ?  ?  ? ?Pertinent Vitals/Pain Pain Assessment ?Pain Score: 7  ?Faces Pain Scale: Hurts a little bit ?Pain Location: L hip ?Pain Descriptors / Indicators: Sore, Aching ?Pain Intervention(s): Monitored during session, Repositioned, Patient requesting pain meds-RN notified  ? ? ?Home Living   ?  ?  ?  ?  ?  ?  ?  ?  ?  ?   ?  ?Prior Function    ?  ?  ?   ? ?PT Goals (current goals can now be found in the care plan section) Acute Rehab PT Goals ?Patient Stated Goal: To walk better ?PT Goal Formulation: With patient ?Time For Goal Achievement: 01/09/22 ?Potential to Achieve Goals: Good ?Progress towards PT goals: Progressing toward goals ? ?  ?Frequency ? ? ? BID ? ? ? ?  ?PT Plan Current plan remains appropriate  ? ? ?Co-evaluation   ?  ?  ?  ?  ? ?  ?AM-PAC PT "6 Clicks" Mobility   ?Outcome Measure ? Help needed turning from your back to your side while in a flat bed without using bedrails?: A Little ?Help needed moving from lying on your back to sitting on the side of a flat bed without using bedrails?: A Little ?Help needed moving to and from a bed to a chair (including a wheelchair)?: A Little ?Help needed standing up from a chair using your arms (e.g., wheelchair or bedside chair)?: A Little ?Help needed to walk in hospital room?: A Little ?Help needed climbing 3-5 steps with a railing? : A Lot ?6 Click Score: 17 ? ?  ?End of Session Equipment Utilized During Treatment: Gait belt ?Activity Tolerance: Patient tolerated treatment well ?Patient left: in bed;with call bell/phone within reach;with bed alarm set;with family/visitor present ?Nurse Communication: Mobility status ?PT Visit Diagnosis: Other abnormalities of gait and mobility (R26.89);Muscle weakness (generalized) (M62.81);Pain ?Pain - Right/Left:  Left ?Pain - part of body: Hip ?  ? ? ?Time: 3833-3832 ?PT Time Calculation (min) (ACUTE ONLY): 19 min ? ?Charges:  $Gait Training: 8-22 mins          ?          ?Iva Boop, PT  ?12/30/21. 9:58 AM ? ? ?

## 2021-12-31 DIAGNOSIS — R2681 Unsteadiness on feet: Secondary | ICD-10-CM | POA: Diagnosis not present

## 2021-12-31 DIAGNOSIS — D62 Acute posthemorrhagic anemia: Secondary | ICD-10-CM | POA: Diagnosis not present

## 2021-12-31 DIAGNOSIS — Z79899 Other long term (current) drug therapy: Secondary | ICD-10-CM | POA: Diagnosis not present

## 2021-12-31 DIAGNOSIS — M6282 Rhabdomyolysis: Secondary | ICD-10-CM | POA: Diagnosis not present

## 2021-12-31 DIAGNOSIS — M6259 Muscle wasting and atrophy, not elsewhere classified, multiple sites: Secondary | ICD-10-CM | POA: Diagnosis not present

## 2021-12-31 DIAGNOSIS — S72301D Unspecified fracture of shaft of right femur, subsequent encounter for closed fracture with routine healing: Secondary | ICD-10-CM | POA: Diagnosis not present

## 2021-12-31 DIAGNOSIS — D649 Anemia, unspecified: Secondary | ICD-10-CM | POA: Diagnosis not present

## 2021-12-31 DIAGNOSIS — W19XXXD Unspecified fall, subsequent encounter: Secondary | ICD-10-CM | POA: Diagnosis not present

## 2021-12-31 DIAGNOSIS — J849 Interstitial pulmonary disease, unspecified: Secondary | ICD-10-CM | POA: Diagnosis not present

## 2021-12-31 DIAGNOSIS — M80052D Age-related osteoporosis with current pathological fracture, left femur, subsequent encounter for fracture with routine healing: Secondary | ICD-10-CM | POA: Diagnosis not present

## 2021-12-31 DIAGNOSIS — E871 Hypo-osmolality and hyponatremia: Secondary | ICD-10-CM | POA: Diagnosis not present

## 2021-12-31 DIAGNOSIS — S72002A Fracture of unspecified part of neck of left femur, initial encounter for closed fracture: Secondary | ICD-10-CM | POA: Diagnosis not present

## 2021-12-31 DIAGNOSIS — R5381 Other malaise: Secondary | ICD-10-CM | POA: Diagnosis not present

## 2021-12-31 DIAGNOSIS — E7849 Other hyperlipidemia: Secondary | ICD-10-CM | POA: Diagnosis not present

## 2021-12-31 DIAGNOSIS — T796XXA Traumatic ischemia of muscle, initial encounter: Secondary | ICD-10-CM | POA: Diagnosis not present

## 2021-12-31 DIAGNOSIS — Z7401 Bed confinement status: Secondary | ICD-10-CM | POA: Diagnosis not present

## 2021-12-31 DIAGNOSIS — R778 Other specified abnormalities of plasma proteins: Secondary | ICD-10-CM | POA: Diagnosis not present

## 2021-12-31 DIAGNOSIS — M80052A Age-related osteoporosis with current pathological fracture, left femur, initial encounter for fracture: Secondary | ICD-10-CM | POA: Diagnosis not present

## 2021-12-31 DIAGNOSIS — S72002D Fracture of unspecified part of neck of left femur, subsequent encounter for closed fracture with routine healing: Secondary | ICD-10-CM | POA: Diagnosis not present

## 2021-12-31 DIAGNOSIS — E569 Vitamin deficiency, unspecified: Secondary | ICD-10-CM | POA: Diagnosis not present

## 2021-12-31 DIAGNOSIS — I10 Essential (primary) hypertension: Secondary | ICD-10-CM | POA: Diagnosis not present

## 2021-12-31 DIAGNOSIS — J302 Other seasonal allergic rhinitis: Secondary | ICD-10-CM | POA: Diagnosis not present

## 2021-12-31 DIAGNOSIS — M6281 Muscle weakness (generalized): Secondary | ICD-10-CM | POA: Diagnosis not present

## 2021-12-31 DIAGNOSIS — F32A Depression, unspecified: Secondary | ICD-10-CM | POA: Diagnosis not present

## 2021-12-31 LAB — OSMOLALITY: Osmolality: 270 mOsm/kg — ABNORMAL LOW (ref 275–295)

## 2021-12-31 LAB — BASIC METABOLIC PANEL
Anion gap: 5 (ref 5–15)
BUN: 9 mg/dL (ref 8–23)
CO2: 27 mmol/L (ref 22–32)
Calcium: 8.7 mg/dL — ABNORMAL LOW (ref 8.9–10.3)
Chloride: 98 mmol/L (ref 98–111)
Creatinine, Ser: 0.44 mg/dL (ref 0.44–1.00)
GFR, Estimated: >60 mL/min (ref >60)
Glucose, Bld: 98 mg/dL (ref 70–99)
Potassium: 3.9 mmol/L (ref 3.5–5.1)
Sodium: 130 mmol/L — ABNORMAL LOW (ref 135–145)

## 2021-12-31 LAB — CK: Total CK: 377 U/L — ABNORMAL HIGH (ref 38–234)

## 2021-12-31 MED ORDER — SODIUM CHLORIDE 1 G PO TABS: 1.0000 g | ORAL_TABLET | Freq: Two times a day (BID) | ORAL | 0 refills | Status: AC

## 2021-12-31 MED ORDER — ENSURE ENLIVE PO LIQD: 237.0000 mL | Freq: Two times a day (BID) | ORAL | 12 refills | Status: DC

## 2021-12-31 MED ORDER — ALUM & MAG HYDROXIDE-SIMETH 200-200-20 MG/5ML PO SUSP: 30.0000 mL | ORAL | 0 refills | Status: DC | PRN

## 2021-12-31 MED ORDER — ACETAMINOPHEN 325 MG PO TABS: 325.0000 mg | ORAL_TABLET | Freq: Four times a day (QID) | ORAL | 0 refills | Status: DC | PRN

## 2021-12-31 MED ORDER — ENOXAPARIN SODIUM 40 MG/0.4ML IJ SOSY: 40.0000 mg | PREFILLED_SYRINGE | INTRAMUSCULAR | 0 refills | Status: DC

## 2021-12-31 MED ORDER — DOCUSATE SODIUM 100 MG PO CAPS: 100.0000 mg | ORAL_CAPSULE | Freq: Two times a day (BID) | ORAL | 0 refills | Status: DC

## 2021-12-31 MED ORDER — HYDROCODONE-ACETAMINOPHEN 5-325 MG PO TABS: 1.0000 | ORAL_TABLET | ORAL | 0 refills | Status: AC | PRN

## 2021-12-31 NOTE — Plan of Care (Signed)

## 2021-12-31 NOTE — Discharge Summary (Signed)
?Physician Discharge Summary ?  ?Patient: Melanie Carrillo MRN: [id number] DOB: [birth year]  ?Admit date:     12/25/2021  ?Discharge date: 12/31/21  ?Discharge Physician: Emeterio Reeve  ? ?PCP: Gayland Curry, MD  ? ?Recommendations at discharge:  ?Follow w/ PCP or SNF attending within 1 week: recommend follow sodium levels and CBC d/t postoperative anemia requiring transfusion as well as hyponatremia ?PT per protocol for hip fracture ?Follow w/ orthopedics in 2 weeks from surgery (01/08/22-01/11/22) for staple removal and XR ?Lovenox x14 days total post op (last dose 01/09/22) ?Amlodipine and losartan have been held, BP has been acceptable range, please adjust antihypertensive regimen as necessary  ? ?Discharge Diagnoses: ?Principal Problem: ?  Hip fracture ([place]) ?Active Problems: ?  Interstitial lung disease ([place]) ?  Rhabdomyolysis ?  Hyponatremia ?  Elevated troponin ?  Acute postoperative anemia due to expected blood loss ? ?Resolved Problems: ?  * No resolved hospital problems. * ? ?Hospital Course: ?Patient is 87 year old female, PMH interstitial lung disease, presented to hospital with hip fracture.  Lives alone, reports SOB with exertion, chronic cough, no hypoxia.  Mechanical fall day prior to arrival in ED, sustained fracture to left hip, left intramedullary nail performed on 12/26/2021.  Hemoglobin dropped down to 7.0, has remained stable post 1 unit PRBC.  As of 12/30/2021, awaiting SNF placement ? ?Consultants:  ?Orthopedic surgery ?  ?Procedures: ?Orthoepdics re fracture: left intramedullary nail performed on 12/26/2021 ? ? ?Assessment and Plan: ?* Hip fracture ([place]) ?Status post surgery.   ?Seen by PT/OT, currently pending nursing home placement. ? ?Acute postoperative anemia due to expected blood loss ?Patient received 1 unit of PRBC.  Iron level normal.  B12 level borderline, homocystine WNL.  Received a B12 shot x2 (2 days ago and yesterday) ? ?Elevated troponin ?Demand ischemia from hip  fracture. ? 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 Sodium has remained stable ?Continue salt tablets.  Patient is not drinking excessive water while in the hospital ?Will recheck BMP, Osm (serum and urine), urine sodium in AM ? ?Rhabdomyolysis ?From fall, condition improved. ? ?Interstitial lung disease ([place]) ?Stable, continue as needed DuoNeb. ? ? ? ? ?  ?Pain control - [employer] Controlled Substance Reporting System database was reviewed. and patient was instructed, not to drive, operate heavy machinery, perform activities at heights, swimming or participation in water activities or provide baby-sitting services while on Pain, Sleep and Anxiety Medications; until their outpatient Physician has advised to do so again. Also recommended to not to take more than prescribed Pain, Sleep and Anxiety Medications.  ? ?Disposition: Skilled nursing facility ?Diet recommendation:  ?Cardiac diet ?DISCHARGE MEDICATION: ?Allergies as of 12/31/2021   ?No Known Allergies ?  ? ?  ?Medication List  ?  ? ?STOP taking these medications   ? ?amLODipine 10 MG tablet ?Commonly known as: NORVASC ?  ?losartan 100 MG tablet ?Commonly known as: COZAAR ?  ? ?  ? ?TAKE these medications   ? ?acetaminophen 325 MG tablet ?Commonly known as: TYLENOL ?Take 1-2 tablets (325-650 mg total) by mouth every 6 (six) hours as needed for mild pain (pain score 1-3 or temp > 100.5). ?  ?alum & mag hydroxide-simeth 200-200-20 MG/5ML suspension ?Commonly known as: MAALOX/MYLANTA ?Take 30 mLs by mouth every 4 (four) hours as needed for indigestion or heartburn. ?  ?brimonidine 0.2 % ophthalmic solution ?Commonly known as: ALPHAGAN ?1 drop 2 (two) times daily. ?  ?Calcium Carb-Cholecalciferol 600-10 MG-MCG Tabs ?Take 1 tablet by  mouth daily. ?  ?docusate sodium 100 MG capsule ?Commonly known as: COLACE ?Take 1 capsule (100 mg total) by mouth 2 (two) times daily. ?  ?enoxaparin 40 MG/0.4ML injection ?Commonly known as: LOVENOX ?Inject  0.4 mLs (40 mg total) into the skin daily for 9 days. ?  ?feeding supplement Liqd ?Take 237 mLs by mouth 2 (two) times daily between meals. ?  ?HYDROcodone-acetaminophen 5-325 MG tablet ?Commonly known as: NORCO/VICODIN ?Take 1 tablet by mouth every 4 (four) hours as needed for up to 5 days for moderate pain or severe pain. ?  ?latanoprost 0.005 % ophthalmic solution ?Commonly known as: XALATAN ?1 drop at bedtime. ?  ?lovastatin 40 MG tablet ?Commonly known as: MEVACOR ?Take 1 tablet by mouth daily. ?  ?montelukast 10 MG tablet ?Commonly known as: SINGULAIR ?Take 10 mg by mouth at bedtime. ?  ?omeprazole 40 MG capsule ?Commonly known as: PRILOSEC ?Take 40 mg by mouth daily. ?  ?sertraline 25 MG tablet ?Commonly known as: ZOLOFT ?Take 25 mg by mouth daily. ?  ?sodium chloride 1 g tablet ?Take 1 tablet (1 g total) by mouth 2 (two) times daily with a meal for 7 days. ?  ? ?  ? ?  ?  ? ? ?  ?Durable Medical Equipment  ?(From admission, onward)  ?  ? ? ?  ? ?  Start     Ordered  ? 12/27/21 1023  For home use only DME Walker rolling  Once       ?Question Answer Comment  ?Walker: With 5 Inch Wheels   ?Patient needs a walker to treat with the following condition Difficulty walking   ?  ? 12/27/21 1022  ? ?  ?  ? ?  ? ? ?  ?Discharge Care Instructions  ?(From admission, onward)  ?  ? ? ?  ? ?  Start     Ordered  ? 12/31/21 0000  Discharge wound care:       ?Comments: Keep clean, dry. Change bandages 1-2 times per day. Contact surgeon if bleeding, rash concerning for infection, or other problems w/ surgical site.  ? 12/31/21 0910  ? ?  ?  ? ?  ? ? Contact information for after-discharge care   ? ? Destination   ? ? [facility] .   ?Service: Skilled Nursing ?Contact information: ?[address] ?[place] ?[phone] ? ?  ?  ? ?  ?  ? ?  ?  ? ?  ? ?Discharge Exam: ?Filed Weights  ? 12/25/21 1422  ?Weight: 73.8 kg  ?General exam: Appears calm and comfortable   ?Respiratory system: Clear to auscultation. Respiratory effort normal. ?Cardiovascular system: S1 & S2 heard, RRR. No pedal edema. ?Gastrointestinal system: Abdomen is nondistended, soft and nontender.  ?Central nervous system: Alert and oriented. No focal neurological deficits. ?Psychiatry: Judgement and insight appear normal. Mood & affect appropriate. ? ? ?Condition at discharge: good ? ?The results of significant diagnostics from this hospitalization (including imaging, microbiology, ancillary and laboratory) are listed below for reference.  ? ?Imaging Studies: ?DG Chest 1 View ? ?Result Date: 12/25/2021 ?CLINICAL DATA:  Preop fall EXAM: CHEST  1 VIEW COMPARISON:  None Available. FINDINGS: Low lung volumes. Mild diffuse reticular opacity suggests chronic lung disease. No acute airspace disease, pleural effusion or pneumothorax. Borderline cardiac size. Aortic atherosclerosis IMPRESSION: No active disease. Low lung volumes. Mild diffuse reticular opacity suggestive of chronic lung disease Electronically Signed   By: Maudie Mercury  Francoise Ceo M.D.   On: 12/25/2021 15:15  ? ?CT Head Wo Contrast ? ?Result Date: 12/25/2021 ?CLINICAL DATA:  Head trauma, minor (Age >= 65y); Neck trauma (Age >= 65y) EXAM: CT HEAD WITHOUT CONTRAST CT CERVICAL SPINE WITHOUT CONTRAST TECHNIQUE: Multidetector CT imaging of the head and cervical spine was performed following the standard protocol without intravenous contrast. Multiplanar CT image reconstructions of the cervical spine were also generated. RADIATION DOSE REDUCTION: This exam was performed according to the departmental dose-optimization program which includes automated exposure control, adjustment of the mA and/or kV according to patient size and/or use of iterative reconstruction technique. COMPARISON:  None Available. FINDINGS: CT HEAD FINDINGS Brain: No evidence of acute intracranial hemorrhage or extra-axial collection.No evidence of mass lesion/concerning mass effect.The ventricles are  normal in size.Scattered subcortical and periventricular white matter hypodensities, nonspecific but likely sequela of chronic small vessel ischemic disease.Mild cerebral atrophy Vascular: No hyperdense vessel. Skull: Negative f

## 2021-12-31 NOTE — Progress Notes (Signed)
Attempted to call report to 3 times. Left msg with Linton Rump to give receiving unit my number to call for report.  ?

## 2021-12-31 NOTE — TOC Progression Note (Addendum)
Transition of Care (TOC) - Progression Note  ? ? ?Patient Details  ?Name: Melanie Carrillo ?MRN: [id number] ?Date of Birth: 1935-07-29 ? ?Transition of Care (TOC) CM/SW Contact  ?Candie Chroman, LCSW ?Phone Number: ?12/31/2021, 8:57 AM ? ?Clinical Narrative:  Auth approved. [facility] can accept [facility] today. Physician is aware.  ? ?Expected Discharge Plan: [place] ?Barriers to Discharge: Continued Medical Work up ? ?Expected Discharge Plan and Services ?Expected Discharge Plan: [place] ?  ?  ?  ?  ?                ?  ?  ?  ?  ?  ?  ?  ?  ?  ?  ? ? ?Social Determinants of Health (SDOH) Interventions ?  ? ?Readmission Risk Interventions ?   ? View : No data to display.  ?  ?  ?  ? ? ?

## 2021-12-31 NOTE — Care Management Important Message (Signed)
Important Message ? ?Patient Details  ?Name: Melanie Carrillo ?MRN: [id number] ?Date of Birth: [birth year] ? ? ?Medicare Important Message Given:  Yes ? ? ? ? ?Juliann Pulse A Anai Lipson ?12/31/2021, 9:42 AM ?

## 2021-12-31 NOTE — TOC Transition Note (Signed)
Transition of Care (TOC) - CM/SW Discharge Note ? ? ?Patient Details  ?Name: Melanie Carrillo ?MRN: [id number] ?Date of Birth: [birth year] ? ?Transition of Care (TOC) CM/SW Contact:  ?Candie Chroman, LCSW ?Phone Number: ?12/31/2021, 10:46 AM ? ? ?Clinical Narrative:  Patient has orders to discharge to [facility] today. RN has tried calling report. EMS transport arranged for 11:45 am. No further concerns. CSW signing off.  ? ?Final next level of care: [place] ?Barriers to Discharge: Barriers Resolved ? ? ?Patient Goals and CMS Choice ?  ?  ?Choice offered to / list presented to : Patient, Adult Children ? ?Discharge Placement ?PASRR number recieved: 12/29/21 ?           ?Patient chooses bed at: [facility] [place] ?Patient to be transferred to facility by: EMS ?Name of family member notified: Rella Larve ?Patient and family notified of of transfer: 12/31/21 ? ?Discharge Plan and Services ?  ?  ?           ?  ?  ?  ?  ?  ?  ?  ?  ?  ?  ? ?Social Determinants of Health (SDOH) Interventions ?  ? ? ?Readmission Risk Interventions ?   ? View : No data to display.  ?  ?  ?  ? ? ? ? ? ?

## 2021-12-31 NOTE — Progress Notes (Signed)
Report given to Kim at [facility] ?

## 2022-01-01 DIAGNOSIS — D649 Anemia, unspecified: Secondary | ICD-10-CM | POA: Diagnosis not present

## 2022-01-01 DIAGNOSIS — S72301D Unspecified fracture of shaft of right femur, subsequent encounter for closed fracture with routine healing: Secondary | ICD-10-CM | POA: Diagnosis not present

## 2022-01-01 DIAGNOSIS — M6281 Muscle weakness (generalized): Secondary | ICD-10-CM | POA: Diagnosis not present

## 2022-01-01 DIAGNOSIS — E871 Hypo-osmolality and hyponatremia: Secondary | ICD-10-CM | POA: Diagnosis not present

## 2022-01-04 ENCOUNTER — Encounter: Payer: Self-pay | Admitting: Orthopaedic Surgery

## 2022-01-05 DIAGNOSIS — M6281 Muscle weakness (generalized): Secondary | ICD-10-CM | POA: Diagnosis not present

## 2022-01-05 DIAGNOSIS — M80052D Age-related osteoporosis with current pathological fracture, left femur, subsequent encounter for fracture with routine healing: Secondary | ICD-10-CM | POA: Diagnosis not present

## 2022-01-05 DIAGNOSIS — D649 Anemia, unspecified: Secondary | ICD-10-CM | POA: Diagnosis not present

## 2022-01-05 DIAGNOSIS — J849 Interstitial pulmonary disease, unspecified: Secondary | ICD-10-CM | POA: Diagnosis not present

## 2022-01-07 DIAGNOSIS — D649 Anemia, unspecified: Secondary | ICD-10-CM | POA: Diagnosis not present

## 2022-01-07 DIAGNOSIS — E871 Hypo-osmolality and hyponatremia: Secondary | ICD-10-CM | POA: Diagnosis not present

## 2022-01-07 DIAGNOSIS — S72301D Unspecified fracture of shaft of right femur, subsequent encounter for closed fracture with routine healing: Secondary | ICD-10-CM | POA: Diagnosis not present

## 2022-01-11 DIAGNOSIS — I10 Essential (primary) hypertension: Secondary | ICD-10-CM | POA: Diagnosis not present

## 2022-01-11 DIAGNOSIS — S72301D Unspecified fracture of shaft of right femur, subsequent encounter for closed fracture with routine healing: Secondary | ICD-10-CM | POA: Diagnosis not present

## 2022-01-13 DIAGNOSIS — D649 Anemia, unspecified: Secondary | ICD-10-CM | POA: Diagnosis not present

## 2022-01-13 DIAGNOSIS — M6281 Muscle weakness (generalized): Secondary | ICD-10-CM | POA: Diagnosis not present

## 2022-01-13 DIAGNOSIS — I10 Essential (primary) hypertension: Secondary | ICD-10-CM | POA: Diagnosis not present

## 2022-01-13 DIAGNOSIS — S72301D Unspecified fracture of shaft of right femur, subsequent encounter for closed fracture with routine healing: Secondary | ICD-10-CM | POA: Diagnosis not present

## 2022-01-16 DIAGNOSIS — E785 Hyperlipidemia, unspecified: Secondary | ICD-10-CM | POA: Diagnosis not present

## 2022-01-16 DIAGNOSIS — I10 Essential (primary) hypertension: Secondary | ICD-10-CM | POA: Diagnosis not present

## 2022-01-16 DIAGNOSIS — E871 Hypo-osmolality and hyponatremia: Secondary | ICD-10-CM | POA: Diagnosis not present

## 2022-01-16 DIAGNOSIS — S72002D Fracture of unspecified part of neck of left femur, subsequent encounter for closed fracture with routine healing: Secondary | ICD-10-CM | POA: Diagnosis not present

## 2022-01-16 DIAGNOSIS — R69 Illness, unspecified: Secondary | ICD-10-CM | POA: Diagnosis not present

## 2022-01-16 DIAGNOSIS — K5900 Constipation, unspecified: Secondary | ICD-10-CM | POA: Diagnosis not present

## 2022-01-16 DIAGNOSIS — K219 Gastro-esophageal reflux disease without esophagitis: Secondary | ICD-10-CM | POA: Diagnosis not present

## 2022-01-16 DIAGNOSIS — D649 Anemia, unspecified: Secondary | ICD-10-CM | POA: Diagnosis not present

## 2022-01-16 DIAGNOSIS — E222 Syndrome of inappropriate secretion of antidiuretic hormone: Secondary | ICD-10-CM | POA: Diagnosis not present

## 2022-01-16 DIAGNOSIS — R631 Polydipsia: Secondary | ICD-10-CM | POA: Diagnosis not present

## 2022-01-16 DIAGNOSIS — J849 Interstitial pulmonary disease, unspecified: Secondary | ICD-10-CM | POA: Diagnosis not present

## 2022-01-16 DIAGNOSIS — Z471 Aftercare following joint replacement surgery: Secondary | ICD-10-CM | POA: Diagnosis not present

## 2022-01-16 DIAGNOSIS — Z791 Long term (current) use of non-steroidal anti-inflammatories (NSAID): Secondary | ICD-10-CM | POA: Diagnosis not present

## 2022-01-16 DIAGNOSIS — Z9181 History of falling: Secondary | ICD-10-CM | POA: Diagnosis not present

## 2022-01-16 DIAGNOSIS — H409 Unspecified glaucoma: Secondary | ICD-10-CM | POA: Diagnosis not present

## 2022-01-20 DIAGNOSIS — S72002D Fracture of unspecified part of neck of left femur, subsequent encounter for closed fracture with routine healing: Secondary | ICD-10-CM | POA: Diagnosis not present

## 2022-01-20 DIAGNOSIS — K5900 Constipation, unspecified: Secondary | ICD-10-CM | POA: Diagnosis not present

## 2022-01-20 DIAGNOSIS — K219 Gastro-esophageal reflux disease without esophagitis: Secondary | ICD-10-CM | POA: Diagnosis not present

## 2022-01-20 DIAGNOSIS — D649 Anemia, unspecified: Secondary | ICD-10-CM | POA: Diagnosis not present

## 2022-01-20 DIAGNOSIS — H409 Unspecified glaucoma: Secondary | ICD-10-CM | POA: Diagnosis not present

## 2022-01-20 DIAGNOSIS — I10 Essential (primary) hypertension: Secondary | ICD-10-CM | POA: Diagnosis not present

## 2022-01-20 DIAGNOSIS — E785 Hyperlipidemia, unspecified: Secondary | ICD-10-CM | POA: Diagnosis not present

## 2022-01-20 DIAGNOSIS — R69 Illness, unspecified: Secondary | ICD-10-CM | POA: Diagnosis not present

## 2022-01-20 DIAGNOSIS — Z9181 History of falling: Secondary | ICD-10-CM | POA: Diagnosis not present

## 2022-01-20 DIAGNOSIS — E222 Syndrome of inappropriate secretion of antidiuretic hormone: Secondary | ICD-10-CM | POA: Diagnosis not present

## 2022-01-20 DIAGNOSIS — Z791 Long term (current) use of non-steroidal anti-inflammatories (NSAID): Secondary | ICD-10-CM | POA: Diagnosis not present

## 2022-01-20 DIAGNOSIS — E871 Hypo-osmolality and hyponatremia: Secondary | ICD-10-CM | POA: Diagnosis not present

## 2022-01-20 DIAGNOSIS — J849 Interstitial pulmonary disease, unspecified: Secondary | ICD-10-CM | POA: Diagnosis not present

## 2022-01-20 DIAGNOSIS — R631 Polydipsia: Secondary | ICD-10-CM | POA: Diagnosis not present

## 2022-01-22 DIAGNOSIS — Z791 Long term (current) use of non-steroidal anti-inflammatories (NSAID): Secondary | ICD-10-CM | POA: Diagnosis not present

## 2022-01-22 DIAGNOSIS — E785 Hyperlipidemia, unspecified: Secondary | ICD-10-CM | POA: Diagnosis not present

## 2022-01-22 DIAGNOSIS — K5900 Constipation, unspecified: Secondary | ICD-10-CM | POA: Diagnosis not present

## 2022-01-22 DIAGNOSIS — H409 Unspecified glaucoma: Secondary | ICD-10-CM | POA: Diagnosis not present

## 2022-01-22 DIAGNOSIS — E222 Syndrome of inappropriate secretion of antidiuretic hormone: Secondary | ICD-10-CM | POA: Diagnosis not present

## 2022-01-22 DIAGNOSIS — I10 Essential (primary) hypertension: Secondary | ICD-10-CM | POA: Diagnosis not present

## 2022-01-22 DIAGNOSIS — R69 Illness, unspecified: Secondary | ICD-10-CM | POA: Diagnosis not present

## 2022-01-22 DIAGNOSIS — R631 Polydipsia: Secondary | ICD-10-CM | POA: Diagnosis not present

## 2022-01-22 DIAGNOSIS — J849 Interstitial pulmonary disease, unspecified: Secondary | ICD-10-CM | POA: Diagnosis not present

## 2022-01-22 DIAGNOSIS — S72002D Fracture of unspecified part of neck of left femur, subsequent encounter for closed fracture with routine healing: Secondary | ICD-10-CM | POA: Diagnosis not present

## 2022-01-22 DIAGNOSIS — D649 Anemia, unspecified: Secondary | ICD-10-CM | POA: Diagnosis not present

## 2022-01-22 DIAGNOSIS — Z9181 History of falling: Secondary | ICD-10-CM | POA: Diagnosis not present

## 2022-01-22 DIAGNOSIS — E871 Hypo-osmolality and hyponatremia: Secondary | ICD-10-CM | POA: Diagnosis not present

## 2022-01-22 DIAGNOSIS — K219 Gastro-esophageal reflux disease without esophagitis: Secondary | ICD-10-CM | POA: Diagnosis not present

## 2022-01-25 DIAGNOSIS — S7292XA Unspecified fracture of left femur, initial encounter for closed fracture: Secondary | ICD-10-CM | POA: Diagnosis not present

## 2022-01-26 DIAGNOSIS — H409 Unspecified glaucoma: Secondary | ICD-10-CM | POA: Diagnosis not present

## 2022-01-26 DIAGNOSIS — K219 Gastro-esophageal reflux disease without esophagitis: Secondary | ICD-10-CM | POA: Diagnosis not present

## 2022-01-26 DIAGNOSIS — E871 Hypo-osmolality and hyponatremia: Secondary | ICD-10-CM | POA: Diagnosis not present

## 2022-01-26 DIAGNOSIS — D649 Anemia, unspecified: Secondary | ICD-10-CM | POA: Diagnosis not present

## 2022-01-26 DIAGNOSIS — J849 Interstitial pulmonary disease, unspecified: Secondary | ICD-10-CM | POA: Diagnosis not present

## 2022-01-26 DIAGNOSIS — Z9181 History of falling: Secondary | ICD-10-CM | POA: Diagnosis not present

## 2022-01-26 DIAGNOSIS — R69 Illness, unspecified: Secondary | ICD-10-CM | POA: Diagnosis not present

## 2022-01-26 DIAGNOSIS — E785 Hyperlipidemia, unspecified: Secondary | ICD-10-CM | POA: Diagnosis not present

## 2022-01-26 DIAGNOSIS — Z791 Long term (current) use of non-steroidal anti-inflammatories (NSAID): Secondary | ICD-10-CM | POA: Diagnosis not present

## 2022-01-26 DIAGNOSIS — S72002D Fracture of unspecified part of neck of left femur, subsequent encounter for closed fracture with routine healing: Secondary | ICD-10-CM | POA: Diagnosis not present

## 2022-01-26 DIAGNOSIS — R631 Polydipsia: Secondary | ICD-10-CM | POA: Diagnosis not present

## 2022-01-26 DIAGNOSIS — E222 Syndrome of inappropriate secretion of antidiuretic hormone: Secondary | ICD-10-CM | POA: Diagnosis not present

## 2022-01-26 DIAGNOSIS — I10 Essential (primary) hypertension: Secondary | ICD-10-CM | POA: Diagnosis not present

## 2022-01-26 DIAGNOSIS — K5900 Constipation, unspecified: Secondary | ICD-10-CM | POA: Diagnosis not present

## 2022-01-27 DIAGNOSIS — R631 Polydipsia: Secondary | ICD-10-CM | POA: Diagnosis not present

## 2022-01-27 DIAGNOSIS — K219 Gastro-esophageal reflux disease without esophagitis: Secondary | ICD-10-CM | POA: Diagnosis not present

## 2022-01-27 DIAGNOSIS — Z9181 History of falling: Secondary | ICD-10-CM | POA: Diagnosis not present

## 2022-01-27 DIAGNOSIS — J849 Interstitial pulmonary disease, unspecified: Secondary | ICD-10-CM | POA: Diagnosis not present

## 2022-01-27 DIAGNOSIS — R69 Illness, unspecified: Secondary | ICD-10-CM | POA: Diagnosis not present

## 2022-01-27 DIAGNOSIS — K5900 Constipation, unspecified: Secondary | ICD-10-CM | POA: Diagnosis not present

## 2022-01-27 DIAGNOSIS — E871 Hypo-osmolality and hyponatremia: Secondary | ICD-10-CM | POA: Diagnosis not present

## 2022-01-27 DIAGNOSIS — H409 Unspecified glaucoma: Secondary | ICD-10-CM | POA: Diagnosis not present

## 2022-01-27 DIAGNOSIS — E222 Syndrome of inappropriate secretion of antidiuretic hormone: Secondary | ICD-10-CM | POA: Diagnosis not present

## 2022-01-27 DIAGNOSIS — D649 Anemia, unspecified: Secondary | ICD-10-CM | POA: Diagnosis not present

## 2022-01-27 DIAGNOSIS — Z791 Long term (current) use of non-steroidal anti-inflammatories (NSAID): Secondary | ICD-10-CM | POA: Diagnosis not present

## 2022-01-27 DIAGNOSIS — I10 Essential (primary) hypertension: Secondary | ICD-10-CM | POA: Diagnosis not present

## 2022-01-27 DIAGNOSIS — S72002D Fracture of unspecified part of neck of left femur, subsequent encounter for closed fracture with routine healing: Secondary | ICD-10-CM | POA: Diagnosis not present

## 2022-01-27 DIAGNOSIS — E785 Hyperlipidemia, unspecified: Secondary | ICD-10-CM | POA: Diagnosis not present

## 2022-01-29 DIAGNOSIS — H409 Unspecified glaucoma: Secondary | ICD-10-CM | POA: Diagnosis not present

## 2022-01-29 DIAGNOSIS — K5900 Constipation, unspecified: Secondary | ICD-10-CM | POA: Diagnosis not present

## 2022-01-29 DIAGNOSIS — I10 Essential (primary) hypertension: Secondary | ICD-10-CM | POA: Diagnosis not present

## 2022-01-29 DIAGNOSIS — E222 Syndrome of inappropriate secretion of antidiuretic hormone: Secondary | ICD-10-CM | POA: Diagnosis not present

## 2022-01-29 DIAGNOSIS — Z791 Long term (current) use of non-steroidal anti-inflammatories (NSAID): Secondary | ICD-10-CM | POA: Diagnosis not present

## 2022-01-29 DIAGNOSIS — E785 Hyperlipidemia, unspecified: Secondary | ICD-10-CM | POA: Diagnosis not present

## 2022-01-29 DIAGNOSIS — J849 Interstitial pulmonary disease, unspecified: Secondary | ICD-10-CM | POA: Diagnosis not present

## 2022-01-29 DIAGNOSIS — R69 Illness, unspecified: Secondary | ICD-10-CM | POA: Diagnosis not present

## 2022-01-29 DIAGNOSIS — E871 Hypo-osmolality and hyponatremia: Secondary | ICD-10-CM | POA: Diagnosis not present

## 2022-01-29 DIAGNOSIS — R631 Polydipsia: Secondary | ICD-10-CM | POA: Diagnosis not present

## 2022-01-29 DIAGNOSIS — D649 Anemia, unspecified: Secondary | ICD-10-CM | POA: Diagnosis not present

## 2022-01-29 DIAGNOSIS — K219 Gastro-esophageal reflux disease without esophagitis: Secondary | ICD-10-CM | POA: Diagnosis not present

## 2022-01-29 DIAGNOSIS — Z9181 History of falling: Secondary | ICD-10-CM | POA: Diagnosis not present

## 2022-01-29 DIAGNOSIS — S72002D Fracture of unspecified part of neck of left femur, subsequent encounter for closed fracture with routine healing: Secondary | ICD-10-CM | POA: Diagnosis not present

## 2022-02-01 DIAGNOSIS — Z0279 Encounter for issue of other medical certificate: Secondary | ICD-10-CM | POA: Diagnosis not present

## 2022-02-02 DIAGNOSIS — J849 Interstitial pulmonary disease, unspecified: Secondary | ICD-10-CM | POA: Diagnosis not present

## 2022-02-02 DIAGNOSIS — H409 Unspecified glaucoma: Secondary | ICD-10-CM | POA: Diagnosis not present

## 2022-02-02 DIAGNOSIS — Z9181 History of falling: Secondary | ICD-10-CM | POA: Diagnosis not present

## 2022-02-02 DIAGNOSIS — R69 Illness, unspecified: Secondary | ICD-10-CM | POA: Diagnosis not present

## 2022-02-02 DIAGNOSIS — S72002D Fracture of unspecified part of neck of left femur, subsequent encounter for closed fracture with routine healing: Secondary | ICD-10-CM | POA: Diagnosis not present

## 2022-02-02 DIAGNOSIS — Z791 Long term (current) use of non-steroidal anti-inflammatories (NSAID): Secondary | ICD-10-CM | POA: Diagnosis not present

## 2022-02-02 DIAGNOSIS — E871 Hypo-osmolality and hyponatremia: Secondary | ICD-10-CM | POA: Diagnosis not present

## 2022-02-02 DIAGNOSIS — K5900 Constipation, unspecified: Secondary | ICD-10-CM | POA: Diagnosis not present

## 2022-02-02 DIAGNOSIS — D649 Anemia, unspecified: Secondary | ICD-10-CM | POA: Diagnosis not present

## 2022-02-02 DIAGNOSIS — I10 Essential (primary) hypertension: Secondary | ICD-10-CM | POA: Diagnosis not present

## 2022-02-02 DIAGNOSIS — R631 Polydipsia: Secondary | ICD-10-CM | POA: Diagnosis not present

## 2022-02-02 DIAGNOSIS — E785 Hyperlipidemia, unspecified: Secondary | ICD-10-CM | POA: Diagnosis not present

## 2022-02-02 DIAGNOSIS — K219 Gastro-esophageal reflux disease without esophagitis: Secondary | ICD-10-CM | POA: Diagnosis not present

## 2022-02-02 DIAGNOSIS — E222 Syndrome of inappropriate secretion of antidiuretic hormone: Secondary | ICD-10-CM | POA: Diagnosis not present

## 2022-02-10 DIAGNOSIS — J849 Interstitial pulmonary disease, unspecified: Secondary | ICD-10-CM | POA: Diagnosis not present

## 2022-02-10 DIAGNOSIS — Z791 Long term (current) use of non-steroidal anti-inflammatories (NSAID): Secondary | ICD-10-CM | POA: Diagnosis not present

## 2022-02-10 DIAGNOSIS — R631 Polydipsia: Secondary | ICD-10-CM | POA: Diagnosis not present

## 2022-02-10 DIAGNOSIS — K219 Gastro-esophageal reflux disease without esophagitis: Secondary | ICD-10-CM | POA: Diagnosis not present

## 2022-02-10 DIAGNOSIS — R69 Illness, unspecified: Secondary | ICD-10-CM | POA: Diagnosis not present

## 2022-02-10 DIAGNOSIS — E785 Hyperlipidemia, unspecified: Secondary | ICD-10-CM | POA: Diagnosis not present

## 2022-02-10 DIAGNOSIS — Z9181 History of falling: Secondary | ICD-10-CM | POA: Diagnosis not present

## 2022-02-10 DIAGNOSIS — H409 Unspecified glaucoma: Secondary | ICD-10-CM | POA: Diagnosis not present

## 2022-02-10 DIAGNOSIS — D649 Anemia, unspecified: Secondary | ICD-10-CM | POA: Diagnosis not present

## 2022-02-10 DIAGNOSIS — E871 Hypo-osmolality and hyponatremia: Secondary | ICD-10-CM | POA: Diagnosis not present

## 2022-02-10 DIAGNOSIS — S72002D Fracture of unspecified part of neck of left femur, subsequent encounter for closed fracture with routine healing: Secondary | ICD-10-CM | POA: Diagnosis not present

## 2022-02-10 DIAGNOSIS — I10 Essential (primary) hypertension: Secondary | ICD-10-CM | POA: Diagnosis not present

## 2022-02-10 DIAGNOSIS — E222 Syndrome of inappropriate secretion of antidiuretic hormone: Secondary | ICD-10-CM | POA: Diagnosis not present

## 2022-02-10 DIAGNOSIS — K5900 Constipation, unspecified: Secondary | ICD-10-CM | POA: Diagnosis not present

## 2022-02-12 DIAGNOSIS — E222 Syndrome of inappropriate secretion of antidiuretic hormone: Secondary | ICD-10-CM | POA: Diagnosis not present

## 2022-02-12 DIAGNOSIS — H409 Unspecified glaucoma: Secondary | ICD-10-CM | POA: Diagnosis not present

## 2022-02-12 DIAGNOSIS — S72002D Fracture of unspecified part of neck of left femur, subsequent encounter for closed fracture with routine healing: Secondary | ICD-10-CM | POA: Diagnosis not present

## 2022-02-12 DIAGNOSIS — I10 Essential (primary) hypertension: Secondary | ICD-10-CM | POA: Diagnosis not present

## 2022-02-12 DIAGNOSIS — E785 Hyperlipidemia, unspecified: Secondary | ICD-10-CM | POA: Diagnosis not present

## 2022-02-12 DIAGNOSIS — D649 Anemia, unspecified: Secondary | ICD-10-CM | POA: Diagnosis not present

## 2022-02-12 DIAGNOSIS — R69 Illness, unspecified: Secondary | ICD-10-CM | POA: Diagnosis not present

## 2022-02-12 DIAGNOSIS — K5900 Constipation, unspecified: Secondary | ICD-10-CM | POA: Diagnosis not present

## 2022-02-12 DIAGNOSIS — Z9181 History of falling: Secondary | ICD-10-CM | POA: Diagnosis not present

## 2022-02-12 DIAGNOSIS — Z791 Long term (current) use of non-steroidal anti-inflammatories (NSAID): Secondary | ICD-10-CM | POA: Diagnosis not present

## 2022-02-12 DIAGNOSIS — K219 Gastro-esophageal reflux disease without esophagitis: Secondary | ICD-10-CM | POA: Diagnosis not present

## 2022-02-12 DIAGNOSIS — R631 Polydipsia: Secondary | ICD-10-CM | POA: Diagnosis not present

## 2022-02-12 DIAGNOSIS — J849 Interstitial pulmonary disease, unspecified: Secondary | ICD-10-CM | POA: Diagnosis not present

## 2022-02-12 DIAGNOSIS — E871 Hypo-osmolality and hyponatremia: Secondary | ICD-10-CM | POA: Diagnosis not present

## 2022-02-16 DIAGNOSIS — R631 Polydipsia: Secondary | ICD-10-CM | POA: Diagnosis not present

## 2022-02-16 DIAGNOSIS — E222 Syndrome of inappropriate secretion of antidiuretic hormone: Secondary | ICD-10-CM | POA: Diagnosis not present

## 2022-02-16 DIAGNOSIS — D649 Anemia, unspecified: Secondary | ICD-10-CM | POA: Diagnosis not present

## 2022-02-16 DIAGNOSIS — K219 Gastro-esophageal reflux disease without esophagitis: Secondary | ICD-10-CM | POA: Diagnosis not present

## 2022-02-16 DIAGNOSIS — S72002D Fracture of unspecified part of neck of left femur, subsequent encounter for closed fracture with routine healing: Secondary | ICD-10-CM | POA: Diagnosis not present

## 2022-02-16 DIAGNOSIS — Z9181 History of falling: Secondary | ICD-10-CM | POA: Diagnosis not present

## 2022-02-16 DIAGNOSIS — E871 Hypo-osmolality and hyponatremia: Secondary | ICD-10-CM | POA: Diagnosis not present

## 2022-02-16 DIAGNOSIS — I10 Essential (primary) hypertension: Secondary | ICD-10-CM | POA: Diagnosis not present

## 2022-02-16 DIAGNOSIS — J849 Interstitial pulmonary disease, unspecified: Secondary | ICD-10-CM | POA: Diagnosis not present

## 2022-02-16 DIAGNOSIS — R69 Illness, unspecified: Secondary | ICD-10-CM | POA: Diagnosis not present

## 2022-02-16 DIAGNOSIS — K5900 Constipation, unspecified: Secondary | ICD-10-CM | POA: Diagnosis not present

## 2022-02-16 DIAGNOSIS — E785 Hyperlipidemia, unspecified: Secondary | ICD-10-CM | POA: Diagnosis not present

## 2022-02-16 DIAGNOSIS — Z791 Long term (current) use of non-steroidal anti-inflammatories (NSAID): Secondary | ICD-10-CM | POA: Diagnosis not present

## 2022-02-16 DIAGNOSIS — H409 Unspecified glaucoma: Secondary | ICD-10-CM | POA: Diagnosis not present

## 2022-02-25 DIAGNOSIS — K5900 Constipation, unspecified: Secondary | ICD-10-CM | POA: Diagnosis not present

## 2022-02-25 DIAGNOSIS — S72002D Fracture of unspecified part of neck of left femur, subsequent encounter for closed fracture with routine healing: Secondary | ICD-10-CM | POA: Diagnosis not present

## 2022-02-25 DIAGNOSIS — D649 Anemia, unspecified: Secondary | ICD-10-CM | POA: Diagnosis not present

## 2022-02-25 DIAGNOSIS — E785 Hyperlipidemia, unspecified: Secondary | ICD-10-CM | POA: Diagnosis not present

## 2022-02-25 DIAGNOSIS — Z791 Long term (current) use of non-steroidal anti-inflammatories (NSAID): Secondary | ICD-10-CM | POA: Diagnosis not present

## 2022-02-25 DIAGNOSIS — E871 Hypo-osmolality and hyponatremia: Secondary | ICD-10-CM | POA: Diagnosis not present

## 2022-02-25 DIAGNOSIS — R631 Polydipsia: Secondary | ICD-10-CM | POA: Diagnosis not present

## 2022-02-25 DIAGNOSIS — H409 Unspecified glaucoma: Secondary | ICD-10-CM | POA: Diagnosis not present

## 2022-02-25 DIAGNOSIS — E222 Syndrome of inappropriate secretion of antidiuretic hormone: Secondary | ICD-10-CM | POA: Diagnosis not present

## 2022-02-25 DIAGNOSIS — J849 Interstitial pulmonary disease, unspecified: Secondary | ICD-10-CM | POA: Diagnosis not present

## 2022-02-25 DIAGNOSIS — Z9181 History of falling: Secondary | ICD-10-CM | POA: Diagnosis not present

## 2022-02-25 DIAGNOSIS — K219 Gastro-esophageal reflux disease without esophagitis: Secondary | ICD-10-CM | POA: Diagnosis not present

## 2022-02-25 DIAGNOSIS — I10 Essential (primary) hypertension: Secondary | ICD-10-CM | POA: Diagnosis not present

## 2022-02-25 DIAGNOSIS — R69 Illness, unspecified: Secondary | ICD-10-CM | POA: Diagnosis not present

## 2022-03-03 DIAGNOSIS — E222 Syndrome of inappropriate secretion of antidiuretic hormone: Secondary | ICD-10-CM | POA: Diagnosis not present

## 2022-03-03 DIAGNOSIS — I10 Essential (primary) hypertension: Secondary | ICD-10-CM | POA: Diagnosis not present

## 2022-03-03 DIAGNOSIS — H409 Unspecified glaucoma: Secondary | ICD-10-CM | POA: Diagnosis not present

## 2022-03-03 DIAGNOSIS — R631 Polydipsia: Secondary | ICD-10-CM | POA: Diagnosis not present

## 2022-03-03 DIAGNOSIS — R69 Illness, unspecified: Secondary | ICD-10-CM | POA: Diagnosis not present

## 2022-03-03 DIAGNOSIS — Z791 Long term (current) use of non-steroidal anti-inflammatories (NSAID): Secondary | ICD-10-CM | POA: Diagnosis not present

## 2022-03-03 DIAGNOSIS — E871 Hypo-osmolality and hyponatremia: Secondary | ICD-10-CM | POA: Diagnosis not present

## 2022-03-03 DIAGNOSIS — K5900 Constipation, unspecified: Secondary | ICD-10-CM | POA: Diagnosis not present

## 2022-03-03 DIAGNOSIS — J849 Interstitial pulmonary disease, unspecified: Secondary | ICD-10-CM | POA: Diagnosis not present

## 2022-03-03 DIAGNOSIS — K219 Gastro-esophageal reflux disease without esophagitis: Secondary | ICD-10-CM | POA: Diagnosis not present

## 2022-03-03 DIAGNOSIS — Z9181 History of falling: Secondary | ICD-10-CM | POA: Diagnosis not present

## 2022-03-03 DIAGNOSIS — E785 Hyperlipidemia, unspecified: Secondary | ICD-10-CM | POA: Diagnosis not present

## 2022-03-03 DIAGNOSIS — D649 Anemia, unspecified: Secondary | ICD-10-CM | POA: Diagnosis not present

## 2022-03-03 DIAGNOSIS — S72002D Fracture of unspecified part of neck of left femur, subsequent encounter for closed fracture with routine healing: Secondary | ICD-10-CM | POA: Diagnosis not present

## 2022-03-10 DIAGNOSIS — R631 Polydipsia: Secondary | ICD-10-CM | POA: Diagnosis not present

## 2022-03-10 DIAGNOSIS — K219 Gastro-esophageal reflux disease without esophagitis: Secondary | ICD-10-CM | POA: Diagnosis not present

## 2022-03-10 DIAGNOSIS — E871 Hypo-osmolality and hyponatremia: Secondary | ICD-10-CM | POA: Diagnosis not present

## 2022-03-10 DIAGNOSIS — R69 Illness, unspecified: Secondary | ICD-10-CM | POA: Diagnosis not present

## 2022-03-10 DIAGNOSIS — J849 Interstitial pulmonary disease, unspecified: Secondary | ICD-10-CM | POA: Diagnosis not present

## 2022-03-10 DIAGNOSIS — H409 Unspecified glaucoma: Secondary | ICD-10-CM | POA: Diagnosis not present

## 2022-03-10 DIAGNOSIS — Z791 Long term (current) use of non-steroidal anti-inflammatories (NSAID): Secondary | ICD-10-CM | POA: Diagnosis not present

## 2022-03-10 DIAGNOSIS — Z9181 History of falling: Secondary | ICD-10-CM | POA: Diagnosis not present

## 2022-03-10 DIAGNOSIS — S72002D Fracture of unspecified part of neck of left femur, subsequent encounter for closed fracture with routine healing: Secondary | ICD-10-CM | POA: Diagnosis not present

## 2022-03-10 DIAGNOSIS — I10 Essential (primary) hypertension: Secondary | ICD-10-CM | POA: Diagnosis not present

## 2022-03-10 DIAGNOSIS — K5900 Constipation, unspecified: Secondary | ICD-10-CM | POA: Diagnosis not present

## 2022-03-10 DIAGNOSIS — E222 Syndrome of inappropriate secretion of antidiuretic hormone: Secondary | ICD-10-CM | POA: Diagnosis not present

## 2022-03-10 DIAGNOSIS — D649 Anemia, unspecified: Secondary | ICD-10-CM | POA: Diagnosis not present

## 2022-03-10 DIAGNOSIS — E785 Hyperlipidemia, unspecified: Secondary | ICD-10-CM | POA: Diagnosis not present

## 2022-03-15 DIAGNOSIS — J849 Interstitial pulmonary disease, unspecified: Secondary | ICD-10-CM | POA: Diagnosis not present

## 2022-03-15 DIAGNOSIS — H409 Unspecified glaucoma: Secondary | ICD-10-CM | POA: Diagnosis not present

## 2022-03-15 DIAGNOSIS — K219 Gastro-esophageal reflux disease without esophagitis: Secondary | ICD-10-CM | POA: Diagnosis not present

## 2022-03-15 DIAGNOSIS — I10 Essential (primary) hypertension: Secondary | ICD-10-CM | POA: Diagnosis not present

## 2022-03-15 DIAGNOSIS — K5900 Constipation, unspecified: Secondary | ICD-10-CM | POA: Diagnosis not present

## 2022-03-15 DIAGNOSIS — R631 Polydipsia: Secondary | ICD-10-CM | POA: Diagnosis not present

## 2022-03-15 DIAGNOSIS — E871 Hypo-osmolality and hyponatremia: Secondary | ICD-10-CM | POA: Diagnosis not present

## 2022-03-15 DIAGNOSIS — E785 Hyperlipidemia, unspecified: Secondary | ICD-10-CM | POA: Diagnosis not present

## 2022-03-15 DIAGNOSIS — Z791 Long term (current) use of non-steroidal anti-inflammatories (NSAID): Secondary | ICD-10-CM | POA: Diagnosis not present

## 2022-03-15 DIAGNOSIS — E222 Syndrome of inappropriate secretion of antidiuretic hormone: Secondary | ICD-10-CM | POA: Diagnosis not present

## 2022-03-15 DIAGNOSIS — D649 Anemia, unspecified: Secondary | ICD-10-CM | POA: Diagnosis not present

## 2022-03-15 DIAGNOSIS — Z9181 History of falling: Secondary | ICD-10-CM | POA: Diagnosis not present

## 2022-03-15 DIAGNOSIS — R69 Illness, unspecified: Secondary | ICD-10-CM | POA: Diagnosis not present

## 2022-03-15 DIAGNOSIS — S72002D Fracture of unspecified part of neck of left femur, subsequent encounter for closed fracture with routine healing: Secondary | ICD-10-CM | POA: Diagnosis not present

## 2022-04-08 DIAGNOSIS — Z4889 Encounter for other specified surgical aftercare: Secondary | ICD-10-CM | POA: Diagnosis not present

## 2022-04-27 ENCOUNTER — Emergency Department (HOSPITAL_COMMUNITY): Payer: Medicare HMO

## 2022-04-27 ENCOUNTER — Inpatient Hospital Stay (HOSPITAL_COMMUNITY)
Admission: EM | Admit: 2022-04-27 | Discharge: 2022-05-04 | DRG: 481 | Disposition: A | Discharge status: Skilled Nursing Facility | Payer: Medicare HMO | Attending: Internal Medicine | Admitting: Internal Medicine

## 2022-04-27 ENCOUNTER — Encounter (HOSPITAL_COMMUNITY): Payer: Self-pay | Admitting: Emergency Medicine

## 2022-04-27 DIAGNOSIS — K219 Gastro-esophageal reflux disease without esophagitis: Secondary | ICD-10-CM | POA: Diagnosis not present

## 2022-04-27 DIAGNOSIS — Z79899 Other long term (current) drug therapy: Secondary | ICD-10-CM

## 2022-04-27 DIAGNOSIS — Z0389 Encounter for observation for other suspected diseases and conditions ruled out: Secondary | ICD-10-CM | POA: Diagnosis not present

## 2022-04-27 DIAGNOSIS — H409 Unspecified glaucoma: Secondary | ICD-10-CM | POA: Diagnosis not present

## 2022-04-27 DIAGNOSIS — Z85038 Personal history of other malignant neoplasm of large intestine: Secondary | ICD-10-CM

## 2022-04-27 DIAGNOSIS — G4733 Obstructive sleep apnea (adult) (pediatric): Secondary | ICD-10-CM | POA: Diagnosis not present

## 2022-04-27 DIAGNOSIS — D62 Acute posthemorrhagic anemia: Secondary | ICD-10-CM | POA: Diagnosis not present

## 2022-04-27 DIAGNOSIS — J849 Interstitial pulmonary disease, unspecified: Secondary | ICD-10-CM | POA: Diagnosis present

## 2022-04-27 DIAGNOSIS — S7222XK Displaced subtrochanteric fracture of left femur, subsequent encounter for closed fracture with nonunion: Secondary | ICD-10-CM | POA: Diagnosis not present

## 2022-04-27 DIAGNOSIS — T84125A Displacement of internal fixation device of left femur, initial encounter: Secondary | ICD-10-CM | POA: Diagnosis not present

## 2022-04-27 DIAGNOSIS — G4730 Sleep apnea, unspecified: Secondary | ICD-10-CM | POA: Diagnosis not present

## 2022-04-27 DIAGNOSIS — I10 Essential (primary) hypertension: Secondary | ICD-10-CM | POA: Diagnosis not present

## 2022-04-27 DIAGNOSIS — T84199A Other mechanical complication of internal fixation device of unspecified bone of limb, initial encounter: Secondary | ICD-10-CM | POA: Diagnosis not present

## 2022-04-27 DIAGNOSIS — S72102A Unspecified trochanteric fracture of left femur, initial encounter for closed fracture: Secondary | ICD-10-CM | POA: Diagnosis not present

## 2022-04-27 DIAGNOSIS — S72142A Displaced intertrochanteric fracture of left femur, initial encounter for closed fracture: Principal | ICD-10-CM | POA: Diagnosis present

## 2022-04-27 DIAGNOSIS — E782 Mixed hyperlipidemia: Secondary | ICD-10-CM

## 2022-04-27 DIAGNOSIS — S72142K Displaced intertrochanteric fracture of left femur, subsequent encounter for closed fracture with nonunion: Secondary | ICD-10-CM | POA: Diagnosis not present

## 2022-04-27 DIAGNOSIS — IMO0002 Reserved for concepts with insufficient information to code with codable children: Secondary | ICD-10-CM | POA: Insufficient documentation

## 2022-04-27 DIAGNOSIS — Z8781 Personal history of (healed) traumatic fracture: Secondary | ICD-10-CM

## 2022-04-27 DIAGNOSIS — Y792 Prosthetic and other implants, materials and accessory orthopedic devices associated with adverse incidents: Secondary | ICD-10-CM | POA: Diagnosis not present

## 2022-04-27 DIAGNOSIS — S7222XA Displaced subtrochanteric fracture of left femur, initial encounter for closed fracture: Secondary | ICD-10-CM | POA: Diagnosis not present

## 2022-04-27 DIAGNOSIS — M1990 Unspecified osteoarthritis, unspecified site: Secondary | ICD-10-CM | POA: Diagnosis not present

## 2022-04-27 DIAGNOSIS — Z9889 Other specified postprocedural states: Secondary | ICD-10-CM

## 2022-04-27 DIAGNOSIS — S7292XK Unspecified fracture of left femur, subsequent encounter for closed fracture with nonunion: Secondary | ICD-10-CM | POA: Diagnosis not present

## 2022-04-27 DIAGNOSIS — M25552 Pain in left hip: Secondary | ICD-10-CM | POA: Diagnosis not present

## 2022-04-27 HISTORY — DX: Essential (primary) hypertension: I10

## 2022-04-27 HISTORY — DX: Malignant neoplasm of colon, unspecified: C18.9

## 2022-04-27 LAB — BASIC METABOLIC PANEL
Anion gap: 7 (ref 5–15)
BUN: 10 mg/dL (ref 8–23)
CO2: 24 mmol/L (ref 22–32)
Calcium: 10.4 mg/dL — ABNORMAL HIGH (ref 8.9–10.3)
Chloride: 107 mmol/L (ref 98–111)
Creatinine, Ser: 0.65 mg/dL (ref 0.44–1.00)
GFR, Estimated: >60 mL/min (ref >60)
Glucose, Bld: 119 mg/dL — ABNORMAL HIGH (ref 70–99)
Potassium: 4 mmol/L (ref 3.5–5.1)
Sodium: 138 mmol/L (ref 135–145)

## 2022-04-27 LAB — CBC
HCT: 42.1 % (ref 36.0–46.0)
Hemoglobin: 13.6 g/dL (ref 12.0–15.0)
MCH: 33.3 pg (ref 26.0–34.0)
MCHC: 32.3 g/dL (ref 30.0–36.0)
MCV: 103.2 fL — ABNORMAL HIGH (ref 80.0–100.0)
Platelets: 225 10*3/uL (ref 150–400)
RBC: 4.08 MIL/uL (ref 3.87–5.11)
RDW: 12.4 % (ref 11.5–15.5)
WBC: 6.8 10*3/uL (ref 4.0–10.5)
nRBC: 0 % (ref 0.0–0.2)

## 2022-04-27 MED ORDER — ONDANSETRON HCL 4 MG/2ML IJ SOLN: 4.0000 mg | Freq: Four times a day (QID) | INTRAMUSCULAR | Status: DC | PRN

## 2022-04-27 MED ORDER — ACETAMINOPHEN 500 MG PO TABS
1000.0000 mg | ORAL_TABLET | Freq: Four times a day (QID) | ORAL | Status: DC
  Administered 2022-04-27 – 2022-05-04 (×23): 1000 mg via ORAL
  Filled 2022-04-27 (×23): qty 2

## 2022-04-27 MED ORDER — MORPHINE SULFATE (PF) 4 MG/ML IV SOLN
4.0000 mg | Freq: Once | INTRAVENOUS | Status: AC
  Administered 2022-04-27: 4 mg via INTRAVENOUS
  Filled 2022-04-27: qty 1

## 2022-04-27 MED ORDER — MONTELUKAST SODIUM 10 MG PO TABS
10.0000 mg | ORAL_TABLET | Freq: Every day | ORAL | Status: DC
  Administered 2022-04-27 – 2022-05-03 (×7): 10 mg via ORAL
  Filled 2022-04-27 (×7): qty 1

## 2022-04-27 MED ORDER — ALBUTEROL SULFATE (2.5 MG/3ML) 0.083% IN NEBU: 2.5000 mg | INHALATION_SOLUTION | RESPIRATORY_TRACT | Status: DC | PRN

## 2022-04-27 MED ORDER — MORPHINE SULFATE (PF) 4 MG/ML IV SOLN
4.0000 mg | INTRAVENOUS | Status: DC | PRN
  Administered 2022-04-27: 4 mg via INTRAVENOUS
  Filled 2022-04-27: qty 1

## 2022-04-27 MED ORDER — PANTOPRAZOLE SODIUM 40 MG PO TBEC
40.0000 mg | DELAYED_RELEASE_TABLET | Freq: Every day | ORAL | Status: DC
  Administered 2022-04-27 – 2022-05-04 (×7): 40 mg via ORAL
  Filled 2022-04-27 (×7): qty 1

## 2022-04-27 MED ORDER — FENTANYL CITRATE PF 50 MCG/ML IJ SOSY
50.0000 ug | PREFILLED_SYRINGE | Freq: Once | INTRAMUSCULAR | Status: AC
  Administered 2022-04-27: 50 ug via INTRAVENOUS
  Filled 2022-04-27: qty 1

## 2022-04-27 MED ORDER — OXYCODONE HCL 5 MG PO TABS
5.0000 mg | ORAL_TABLET | ORAL | Status: DC | PRN
  Administered 2022-04-27 – 2022-05-04 (×17): 5 mg via ORAL
  Filled 2022-04-27 (×18): qty 1

## 2022-04-27 MED ORDER — PRAVASTATIN SODIUM 40 MG PO TABS
40.0000 mg | ORAL_TABLET | Freq: Every day | ORAL | Status: DC
  Administered 2022-04-28 – 2022-05-03 (×6): 40 mg via ORAL
  Filled 2022-04-27 (×6): qty 1

## 2022-04-27 MED ORDER — OXYCODONE-ACETAMINOPHEN 5-325 MG PO TABS
1.0000 | ORAL_TABLET | Freq: Once | ORAL | Status: AC
  Administered 2022-04-27: 1 via ORAL
  Filled 2022-04-27: qty 1

## 2022-04-27 MED ORDER — LACTATED RINGERS IV SOLN
INTRAVENOUS | Status: DC
Start: 2022-04-28 — End: 2022-04-28

## 2022-04-27 NOTE — ED Provider Triage Note (Signed)
Emergency Medicine Provider Triage Evaluation Note  Melanie Carrillo , a 87 y.o. female  was evaluated in triage.  Pt complains of left-sided hip pain.  Prior history of hip replacement.  Was doing physical therapy exercises at home today when she felt a pop in her left hip and had immediate pain.  Cannot bear weight.  Seen at [facility] where x-ray was performed and had findings concerning for a fracture.  Sent patient to the ER for CT imaging and intervention..  Review of Systems  Positive: Left hip pain Negative: Numbness, tingling, erythema  Physical Exam  BP 119/77 (BP Location: Left Arm)   Pulse (!) 104   Temp 97.7 F (36.5 C) (Oral)   Resp 17   Ht '5\' 4"'$  (1.626 m)   Wt 74 kg   SpO2 94%   BMI 28.00 kg/m  Gen:   Awake, no distress   Resp:  Normal effort  MSK:   Limited range of motion of left hip secondary to pain.  No shortening or external rotation of the left lower extremity.  Compartments are soft.  DP pulse are 2+.  Sensation is intact.  Full range of motion of the left knee and left ankle without pain. Other:    Medical Decision Making  Medically screening exam initiated at 2:21 PM.  Appropriate orders placed.  Melanie Carrillo was informed that the remainder of the evaluation will be completed by another provider, this initial triage assessment does not replace that evaluation, and the importance of remaining in the ED until their evaluation is complete.  Patient with left hip pain after doing physical therapy exercises today.  Patient neurovascularly intact.  Seen at [facility] where x-rays were performed and they had concern for fracture of the intertrochanteric.  Imaging ordered.   Doristine Devoid, PA-C 04/27/22 1427

## 2022-04-27 NOTE — Subjective & Objective (Signed)
CC: left hip pain HPI: 87 yo WF s/p ORIF of intertrochanteric fracture in May 2023 with an intramedullary rod, history of hypertension, GERD, history of interstitial lung disease presents to the hospital today with sudden onset of left leg pain.  Patient completed rehab and was only using a cane.  She was doing her normal exercises at home.  She states that she was in the kitchen.  She was doing some marching exercises.  She felt a sudden pop and immediate pain in her left hip.  She did not fall.  She was seen in the orthopedic office and x-rays showed possible banding of her intramedullary rod.  She was sent to the ER for evaluation.  CT scan shows fracture of the intramedullary rod at the level of the interlocking screw.  Orthopedics was consulted.  [facility] hospitalist contacted for admission.

## 2022-04-27 NOTE — Assessment & Plan Note (Addendum)
Admit to med/surg bed. Appears to have fracture of the intramedullary rod at the level of the interlocking screw. Orthopedics consulted. NPO after MN. Po oxycodone 5 mg  for moderate pain and IV 4 mg morphine for severe pain.

## 2022-04-27 NOTE — ED Provider Notes (Signed)
[facility] EMERGENCY DEPARTMENT Provider Note   CSN: [id number] Arrival date & time: 04/27/22  1312     History  Chief Complaint  Patient presents with   Hip Pain    GWENYTH DINGEE is a 87 y.o. female with history of colon cancer and HTN who presents to the ER for left hip pain. S/p left hip ORIF in May 2023 by Dr Sharlet Salina at [facility]. Patient states she was doing physical therapy exercises today when she heard a "pop" and starting experiencing significant left hip pain. Went to [facility] where x-ray was performed and reviewed by Dr Sharlet Salina. He recommended evaluation in ER with CT scan.    Hip Pain       Home Medications Prior to Admission medications   Medication Sig Start Date End Date Taking? Authorizing Provider  acetaminophen (TYLENOL) 325 MG tablet Take 1-2 tablets (325-650 mg total) by mouth every 6 (six) hours as needed for mild pain (pain score 1-3 or temp > 100.5). 12/31/21   Emeterio Reeve, DO  alum & mag hydroxide-simeth (MAALOX/MYLANTA) 200-200-20 MG/5ML suspension Take 30 mLs by mouth every 4 (four) hours as needed for indigestion or heartburn. 12/31/21   Emeterio Reeve, DO  brimonidine (ALPHAGAN) 0.2 % ophthalmic solution 1 drop 2 (two) times daily. 12/15/21   [provider]  Calcium Carb-Cholecalciferol 600-10 MG-MCG TABS Take 1 tablet by mouth daily.    [provider]  docusate sodium (COLACE) 100 MG capsule Take 1 capsule (100 mg total) by mouth 2 (two) times daily. 12/31/21   Emeterio Reeve, DO  enoxaparin (LOVENOX) 40 MG/0.4ML injection Inject 0.4 mLs (40 mg total) into the skin daily for 9 days. 12/31/21 01/09/22  Emeterio Reeve, DO  feeding supplement (ENSURE ENLIVE / ENSURE PLUS) LIQD Take 237 mLs by mouth 2 (two) times daily between meals. 12/31/21   Emeterio Reeve, DO  latanoprost (XALATAN) 0.005 % ophthalmic solution 1 drop at bedtime. 11/13/21   [provider]  lovastatin (MEVACOR) 40  MG tablet Take 1 tablet by mouth daily. 11/10/21   [provider]  montelukast (SINGULAIR) 10 MG tablet Take 10 mg by mouth at bedtime. 11/10/21   [provider]  omeprazole (PRILOSEC) 40 MG capsule Take 40 mg by mouth daily. 10/05/21   [provider]  sertraline (ZOLOFT) 25 MG tablet Take 25 mg by mouth daily. 08/03/21   [provider]      Allergies    Patient has no known allergies.    Review of Systems   Review of Systems  Musculoskeletal:  Positive for arthralgias.  All other systems reviewed and are negative.   Physical Exam Updated Vital Signs BP (!) 177/70 (BP Location: Right Arm)   Pulse 73   Temp 97.8 F (36.6 C) (Oral)   Resp 14   Ht '5\' 4"'$  (1.626 m)   Wt 74 kg   SpO2 95%   BMI 28.00 kg/m  Physical Exam Vitals and nursing note reviewed.  Constitutional:      Appearance: Normal appearance.  HENT:     Head: Normocephalic and atraumatic.  Eyes:     Conjunctiva/sclera: Conjunctivae normal.  Cardiovascular:     Pulses:          Posterior tibial pulses are 2+ on the right side and 2+ on the left side.  Pulmonary:     Effort: Pulmonary effort is normal. No respiratory distress.  Skin:    General: Skin is warm and dry.  Neurological:  Mental Status: She is alert.  Psychiatric:        Mood and Affect: Mood normal.        Behavior: Behavior normal.     ED Results / Procedures / Treatments   Labs (all labs ordered are listed, but only abnormal results are displayed) Labs Reviewed  CBC  BASIC METABOLIC PANEL    EKG None  Radiology CT Hip Left Wo Contrast  Result Date: 04/27/2022 CLINICAL DATA:  Left hip pain after recent fall. Left hip surgery for fracture 12/26/2021. EXAM: CT OF THE LEFT HIP WITHOUT CONTRAST TECHNIQUE: Multidetector CT imaging of the left hip was performed according to the standard protocol. Multiplanar CT image reconstructions were also generated. RADIATION DOSE REDUCTION: This exam was performed  according to the departmental dose-optimization program which includes automated exposure control, adjustment of the mA and/or kV according to patient size and/or use of iterative reconstruction technique. COMPARISON:  Preoperative radiographs 12/25/2021. Intraoperative radiographs 12/26/2021. None recent. FINDINGS: Bones/Joint/Cartilage Status post long left femoral intramedullary nail fixation of a comminuted subtrochanteric left femur fracture. The intramedullary nail is angulated medially by 21 degrees at the level of the proximal interconnecting screws and appears fractured. In addition, the more inferior interconnecting screw appears fractured. The entire length of the intramedullary nail is not imaged. Comminuted subtrochanteric left femur fracture extending into the intratrochanteric region shows increased displacement compared with the intraoperative images. It is uncertain as to whether this reflects delayed healing of the original injury with interval increased displacement or a new fracture. The lesser trochanter remains medially displaced. The femoral head is intact. Mild underlying left hip degenerative changes. There is a small to moderate left hip joint effusion without significant hip arthropathy. Ligaments Suboptimally assessed by CT. Muscles and Tendons No intramuscular hematoma or significant atrophy. Soft tissues Postsurgical changes lateral to the greater trochanter with granulation tissue or ill-defined fluid. No evidence of acute hematoma. Prominent diverticular changes are noted throughout the sigmoid colon. IMPRESSION: 1. Interval hardware failure status post ORIF of subtrochanteric left femur fracture. The intramedullary nail appears fractured and angulated at the level of the proximal interlocking screws, and the more inferior interlocking screw appears fractured. 2. Increased displacement of the comminuted subtrochanteric/intertrochanteric femur fracture compared with intraoperative  radiographs of 4 months ago. Findings favor delayed fracture healing with interval increased displacement, although a new fracture is possible. No dislocation or fracture of the left hemipelvis. 3. Nonspecific postsurgical fluid collection lateral to the greater trochanter. No evidence of acute hematoma. Electronically Signed   By: Richardean Sale M.D.   On: 04/27/2022 15:03    Procedures Procedures    Medications Ordered in ED Medications  oxyCODONE-acetaminophen (PERCOCET/ROXICET) 5-325 MG per tablet 1 tablet (1 tablet Oral Given 04/27/22 1430)  fentaNYL (SUBLIMAZE) injection 50 mcg (50 mcg Intravenous Given 04/27/22 1828)  morphine (PF) 4 MG/ML injection 4 mg (4 mg Intravenous Given 04/27/22 1944)    ED Course/ Medical Decision Making/ A&P                           Medical Decision Making Amount and/or Complexity of Data Reviewed Labs: ordered.  Risk Prescription drug management. Decision regarding hospitalization.  This patient is a 87 y.o. female who presents to the ED for concern of left hip pain. Went to [facility] office, sent here for CT imaging.   Past Medical History / Social History / Additional history: Chart reviewed. Pertinent results include: s/p left hip ORIF in  May 2023 by Dr Sharlet Salina at [facility]  Physical Exam:  Physical exam performed. The pertinent findings include: Neurovascularly intact in BLE.   Imaging: I personally reviewed and interpreted CT of the left hip which shows intermedullary nail fractured and angulated with increased displacement of sub/intertrochanteric left femur fracture.   Medications / Treatment: Patient given IV pain medication. Preop labs ordered.   Consults obtained: I consulted with [facility] Dr Tonita Cong who recommended consulting on-call provider with orthotrauma.  I consulted with orthopedic surgeon Dr Doreatha Martin who had discussed the case with patient's orthopedist earlier in the day. Recommended medical admission for pain  control, NPO after midnight, and they will consult while inpatient to schedule surgery.   I consulted with hospitalist Dr Bridgett Larsson who will admit.    Disposition: After consideration of the diagnostic results and the patients response to treatment, I feel that patient would benefit from medical admission for pain control and orthopedic surgical correction.   Final Clinical Impression(s) / ED Diagnoses Final diagnoses:  Closed displaced intertrochanteric fracture of left femur, initial encounter ([place])  Status post open reduction and internal fixation (ORIF) of fracture    Rx / DC Orders ED Discharge Orders     None      Portions of this report may have been transcribed using voice recognition software. Every effort was made to ensure accuracy; however, inadvertent computerized transcription errors may be present.    Estill Cotta 04/27/22 2004    Lajean Saver, MD 04/27/22 2109

## 2022-04-27 NOTE — Assessment & Plan Note (Signed)
Stable. On RA. ?

## 2022-04-27 NOTE — H&P (Addendum)
History and Physical    Melanie Carrillo RJJ:[id number] DOB: Feb 10, 1935 DOA: 04/27/2022  DOS: the patient was seen and examined on 04/27/2022  PCP: Gayland Curry, MD   Patient coming from: Home  I have personally briefly reviewed patient's old medical records in [place]  CC: left hip pain HPI: 87 yo WF s/p ORIF of intertrochanteric fracture in May 2023 with an intramedullary rod, history of hypertension, GERD, history of interstitial lung disease presents to the hospital today with sudden onset of left leg pain.  Patient completed rehab and was only using a cane.  She was doing her normal exercises at home.  She states that she was in the kitchen.  She was doing some marching exercises.  She felt a sudden pop and immediate pain in her left hip.  She did not fall.  She was seen in the orthopedic office and x-rays showed possible banding of her intramedullary rod.  She was sent to the ER for evaluation.  CT scan shows fracture of the intramedullary rod at the level of the interlocking screw.  Orthopedics was consulted.  [facility] hospital[facility] contacted for admission.   ED Course: CT scan shows fracture of the intramedullary rod at the level of the interlocking screw.  Review of Systems:  Review of Systems  Constitutional: Negative.   HENT: Negative.    Eyes: Negative.   Respiratory: Negative.    Cardiovascular: Negative.   Gastrointestinal: Negative.   Genitourinary: Negative.   Musculoskeletal:  Positive for joint pain.       Left hip pain  Skin: Negative.   Neurological: Negative.   Endo/Heme/Allergies: Negative.   Psychiatric/Behavioral: Negative.    All other systems reviewed and are negative.   Past Medical History:  Diagnosis Date   Colon cancer ([place])    Hypertension     Past Surgical History:  Procedure Laterality Date   INTRAMEDULLARY (IM) NAIL INTERTROCHANTERIC Left 12/26/2021   Procedure: INTRAMEDULLARY (IM) NAIL INTERTROCHANTRIC;  Surgeon: Renee Harder, MD;  Location: [facility] ORS;  Service: Orthopedics;  Laterality: Left;     reports that she has never smoked. She has never used smokeless tobacco. She reports that she does not drink alcohol and does not use drugs.  No Known Allergies  No family history on file.  Prior to Admission medications   Medication Sig Start Date End Date Taking? Authorizing Provider  acetaminophen (TYLENOL) 325 MG tablet Take 1-2 tablets (325-650 mg total) by mouth every 6 (six) hours as needed for mild pain (pain score 1-3 or temp > 100.5). 12/31/21   Emeterio Reeve, DO  alum & mag hydroxide-simeth (MAALOX/MYLANTA) 200-200-20 MG/5ML suspension Take 30 mLs by mouth every 4 (four) hours as needed for indigestion or heartburn. 12/31/21   Emeterio Reeve, DO  brimonidine (ALPHAGAN) 0.2 % ophthalmic solution 1 drop 2 (two) times daily. 12/15/21   [provider]  Calcium Carb-Cholecalciferol 600-10 MG-MCG TABS Take 1 tablet by mouth daily.    [provider]  docusate sodium (COLACE) 100 MG capsule Take 1 capsule (100 mg total) by mouth 2 (two) times daily. 12/31/21   Emeterio Reeve, DO  enoxaparin (LOVENOX) 40 MG/0.4ML injection Inject 0.4 mLs (40 mg total) into the skin daily for 9 days. 12/31/21 01/09/22  Emeterio Reeve, DO  feeding supplement (ENSURE ENLIVE / ENSURE PLUS) LIQD Take 237 mLs by mouth 2 (two) times daily between meals. 12/31/21   Emeterio Reeve, DO  latanoprost (XALATAN) 0.005 % ophthalmic solution 1 drop at bedtime. 11/13/21  [provider]  lovastatin (MEVACOR) 40 MG tablet Take 1 tablet by mouth daily. 11/10/21   [provider]  montelukast (SINGULAIR) 10 MG tablet Take 10 mg by mouth at bedtime. 11/10/21   [provider]  omeprazole (PRILOSEC) 40 MG capsule Take 40 mg by mouth daily. 10/05/21   [provider]  sertraline (ZOLOFT) 25 MG tablet Take 25 mg by mouth daily. 08/03/21   [provider]    Physical  Exam: Vitals:   04/27/22 1815 04/27/22 1938 04/27/22 1945 04/27/22 2000  BP: (!) 178/85 (!) 177/70 (!) 169/70 (!) 159/78  Pulse: 90 73 73 81  Resp: (!) 25 14 (!) 22 20  Temp: 97.8 F (36.6 C) 97.8 F (36.6 C)    TempSrc: Oral Oral    SpO2: 97% 95% 94% 94%  Weight:      Height:        Physical Exam Vitals reviewed.  Constitutional:      General: She is not in acute distress.    Appearance: Normal appearance. She is not ill-appearing, toxic-appearing or diaphoretic.  HENT:     Head: Normocephalic and atraumatic.     Nose: Nose normal.  Cardiovascular:     Rate and Rhythm: Normal rate and regular rhythm.     Pulses: Normal pulses.  Pulmonary:     Effort: Pulmonary effort is normal. No respiratory distress.     Breath sounds: No wheezing or rales.  Abdominal:     General: Bowel sounds are normal. There is no distension.     Tenderness: There is no abdominal tenderness. There is no guarding or rebound.  Musculoskeletal:     Right lower leg: No edema.     Left lower leg: No edema.  Skin:    General: Skin is warm and dry.     Capillary Refill: Capillary refill takes less than 2 seconds.  Neurological:     General: No focal deficit present.     Mental Status: She is alert and oriented to person, place, and time.      Labs on Admission: I have personally reviewed following labs and imaging studies  CBC: Recent Labs  Lab 04/27/22 2012  WBC 6.8  HGB 13.6  HCT 42.1  MCV 103.2*  PLT 595   Basic Metabolic Panel: Recent Labs  Lab 04/27/22 2012  NA 138  K 4.0  CL 107  CO2 24  GLUCOSE 119*  BUN 10  CREATININE 0.65  CALCIUM 10.4*   GFR: Estimated Creatinine Clearance: 50.6 mL/min (by C-G formula based on SCr of 0.65 mg/dL). Liver Function Tests: No results for input(s): "AST", "ALT", "ALKPHOS", "BILITOT", "PROT", "ALBUMIN" in the last 168 hours. No results for input(s): "LIPASE", "AMYLASE" in the last 168 hours. No results for input(s): "AMMONIA" in the last  168 hours. Coagulation Profile: No results for input(s): "INR", "PROTIME" in the last 168 hours. Cardiac Enzymes: No results for input(s): "CKTOTAL", "CKMB", "CKMBINDEX", "TROPONINI", "TROPONINIHS" in the last 168 hours. BNP (last 3 results) No results for input(s): "PROBNP" in the last 8760 hours. HbA1C: No results for input(s): "HGBA1C" in the last 72 hours. CBG: No results for input(s): "GLUCAP" in the last 168 hours. Lipid Profile: No results for input(s): "CHOL", "HDL", "LDLCALC", "TRIG", "CHOLHDL", "LDLDIRECT" in the last 72 hours. Thyroid Function Tests: No results for input(s): "TSH", "T4TOTAL", "FREET4", "T3FREE", "THYROIDAB" in the last 72 hours. Anemia Panel: No results for input(s): "VITAMINB12", "FOLATE", "FERRITIN", "TIBC", "IRON", "RETICCTPCT" in the last 72 hours. Urine  analysis:    Component Value Date/Time   COLORURINE YELLOW (A) 12/25/2021 1418   APPEARANCEUR CLEAR (A) 12/25/2021 1418   LABSPEC 1.019 12/25/2021 1418   PHURINE 5.0 12/25/2021 1418   GLUCOSEU 50 (A) 12/25/2021 1418   HGBUR SMALL (A) 12/25/2021 1418   BILIRUBINUR NEGATIVE 12/25/2021 1418   KETONESUR NEGATIVE 12/25/2021 1418   PROTEINUR 30 (A) 12/25/2021 1418   NITRITE NEGATIVE 12/25/2021 1418   LEUKOCYTESUR TRACE (A) 12/25/2021 1418    Radiological Exams on Admission: I have personally reviewed images CT Hip Left Wo Contrast  Result Date: 04/27/2022 CLINICAL DATA:  Left hip pain after recent fall. Left hip surgery for fracture 12/26/2021. EXAM: CT OF THE LEFT HIP WITHOUT CONTRAST TECHNIQUE: Multidetector CT imaging of the left hip was performed according to the standard protocol. Multiplanar CT image reconstructions were also generated. RADIATION DOSE REDUCTION: This exam was performed according to the departmental dose-optimization program which includes automated exposure control, adjustment of the mA and/or kV according to patient size and/or use of iterative reconstruction technique. COMPARISON:   Preoperative radiographs 12/25/2021. Intraoperative radiographs 12/26/2021. None recent. FINDINGS: Bones/Joint/Cartilage Status post long left femoral intramedullary nail fixation of a comminuted subtrochanteric left femur fracture. The intramedullary nail is angulated medially by 21 degrees at the level of the proximal interconnecting screws and appears fractured. In addition, the more inferior interconnecting screw appears fractured. The entire length of the intramedullary nail is not imaged. Comminuted subtrochanteric left femur fracture extending into the intratrochanteric region shows increased displacement compared with the intraoperative images. It is uncertain as to whether this reflects delayed healing of the original injury with interval increased displacement or a new fracture. The lesser trochanter remains medially displaced. The femoral head is intact. Mild underlying left hip degenerative changes. There is a small to moderate left hip joint effusion without significant hip arthropathy. Ligaments Suboptimally assessed by CT. Muscles and Tendons No intramuscular hematoma or significant atrophy. Soft tissues Postsurgical changes lateral to the greater trochanter with granulation tissue or ill-defined fluid. No evidence of acute hematoma. Prominent diverticular changes are noted throughout the sigmoid colon. IMPRESSION: 1. Interval hardware failure status post ORIF of subtrochanteric left femur fracture. The intramedullary nail appears fractured and angulated at the level of the proximal interlocking screws, and the more inferior interlocking screw appears fractured. 2. Increased displacement of the comminuted subtrochanteric/intertrochanteric femur fracture compared with intraoperative radiographs of 4 months ago. Findings favor delayed fracture healing with interval increased displacement, although a new fracture is possible. No dislocation or fracture of the left hemipelvis. 3. Nonspecific postsurgical  fluid collection lateral to the greater trochanter. No evidence of acute hematoma. Electronically Signed   By: Richardean Sale M.D.   On: 04/27/2022 15:03    EKG: My personal interpretation of EKG shows: no EKG performed  Assessment/Plan Principal Problem:   Loosening of intramedullary nail (HCC) Active Problems:   Interstitial lung disease (HCC)   Essential hypertension   GERD (gastroesophageal reflux disease)   Hyperlipidemia, mixed   Obstructive sleep apnea    Assessment and Plan: * Loosening of intramedullary nail ([place]) Admit to med/surg bed. Appears to have fracture of the intramedullary rod at the level of the interlocking screw. Orthopedics consulted. NPO after MN. Po oxycodone 5 mg  for moderate pain and IV 4 mg morphine for severe pain.    Hyperlipidemia, mixed Stable. Continue lovastatin 40 mg daily  GERD (gastroesophageal reflux disease) Stable. Continue protonix 40 mg daily  Essential hypertension Stable.  Interstitial lung disease ([place])  Stable. On RA  Obstructive sleep apnea Stable.       DVT prophylaxis: SCDs Code Status: Full Code Family Communication: discussed with pt and dtr valerie at bedside  Disposition Plan: return home  Consults called: EDP has consulted Dr. Lorenso Quarry  Admission status: Inpatient, Med-Surg   Kristopher Oppenheim, DO [facility] 04/27/2022, 8:33 PM

## 2022-04-27 NOTE — Assessment & Plan Note (Signed)
Stable

## 2022-04-27 NOTE — Assessment & Plan Note (Signed)
Stable. Continue protonix 40 mg daily

## 2022-04-27 NOTE — Assessment & Plan Note (Signed)
Stable.  Continue lovastatin 40mg daily

## 2022-04-27 NOTE — ED Triage Notes (Signed)
Pt had left hip surgery in May. Pt was at PT today and marching when she heard a pop. [facility] and [facility]. Needs CT cause possible refracture.

## 2022-04-28 ENCOUNTER — Inpatient Hospital Stay (HOSPITAL_COMMUNITY): Payer: Medicare HMO

## 2022-04-28 ENCOUNTER — Encounter (HOSPITAL_COMMUNITY): Payer: Self-pay | Admitting: Internal Medicine

## 2022-04-28 ENCOUNTER — Inpatient Hospital Stay (HOSPITAL_COMMUNITY): Payer: Medicare HMO | Admitting: Certified Registered Nurse Anesthetist

## 2022-04-28 ENCOUNTER — Encounter (HOSPITAL_COMMUNITY)
Admission: EM | Disposition: A | Discharge status: Skilled Nursing Facility | Payer: Self-pay | Source: Home / Self Care | Attending: Internal Medicine

## 2022-04-28 DIAGNOSIS — M1990 Unspecified osteoarthritis, unspecified site: Secondary | ICD-10-CM

## 2022-04-28 DIAGNOSIS — G4730 Sleep apnea, unspecified: Secondary | ICD-10-CM

## 2022-04-28 DIAGNOSIS — S7292XK Unspecified fracture of left femur, subsequent encounter for closed fracture with nonunion: Secondary | ICD-10-CM

## 2022-04-28 DIAGNOSIS — T84199A Other mechanical complication of internal fixation device of unspecified bone of limb, initial encounter: Secondary | ICD-10-CM

## 2022-04-28 DIAGNOSIS — I10 Essential (primary) hypertension: Secondary | ICD-10-CM

## 2022-04-28 HISTORY — PX: HARDWARE REMOVAL: SHX979

## 2022-04-28 HISTORY — PX: FEMUR IM NAIL: SHX1597

## 2022-04-28 LAB — CBC
HCT: 32.1 % — ABNORMAL LOW (ref 36.0–46.0)
Hemoglobin: 10.7 g/dL — ABNORMAL LOW (ref 12.0–15.0)
MCH: 34.4 pg — ABNORMAL HIGH (ref 26.0–34.0)
MCHC: 33.3 g/dL (ref 30.0–36.0)
MCV: 103.2 fL — ABNORMAL HIGH (ref 80.0–100.0)
Platelets: 164 10*3/uL (ref 150–400)
RBC: 3.11 MIL/uL — ABNORMAL LOW (ref 3.87–5.11)
RDW: 12.3 % (ref 11.5–15.5)
WBC: 8.5 10*3/uL (ref 4.0–10.5)
nRBC: 0 % (ref 0.0–0.2)

## 2022-04-28 LAB — SURGICAL PCR SCREEN
MRSA, PCR: NEGATIVE
Staphylococcus aureus: NEGATIVE

## 2022-04-28 LAB — CREATININE, SERUM
Creatinine, Ser: 0.59 mg/dL (ref 0.44–1.00)
GFR, Estimated: >60 mL/min (ref >60)

## 2022-04-28 SURGERY — REMOVAL, HARDWARE
Anesthesia: General | Laterality: Left

## 2022-04-28 MED ORDER — TRANEXAMIC ACID-NACL 1000-0.7 MG/100ML-% IV SOLN
1000.0000 mg | INTRAVENOUS | Status: AC
  Administered 2022-04-28: 1000 mg via INTRAVENOUS
  Filled 2022-04-28: qty 100

## 2022-04-28 MED ORDER — DOCUSATE SODIUM 100 MG PO CAPS
100.0000 mg | ORAL_CAPSULE | Freq: Two times a day (BID) | ORAL | Status: DC
  Administered 2022-04-28 – 2022-05-04 (×11): 100 mg via ORAL
  Filled 2022-04-28 (×12): qty 1

## 2022-04-28 MED ORDER — EPHEDRINE SULFATE (PRESSORS) 50 MG/ML IJ SOLN: INTRAMUSCULAR | Status: DC | PRN

## 2022-04-28 MED ORDER — LIDOCAINE 2% (20 MG/ML) 5 ML SYRINGE
INTRAMUSCULAR | Status: DC | PRN
  Administered 2022-04-28: 75 mg via INTRAVENOUS

## 2022-04-28 MED ORDER — CEFAZOLIN SODIUM-DEXTROSE 2-4 GM/100ML-% IV SOLN
2.0000 g | INTRAVENOUS | Status: AC
  Administered 2022-04-28: 2 g via INTRAVENOUS
  Filled 2022-04-28: qty 100

## 2022-04-28 MED ORDER — CEFAZOLIN SODIUM-DEXTROSE 2-4 GM/100ML-% IV SOLN
2.0000 g | Freq: Three times a day (TID) | INTRAVENOUS | Status: AC
  Administered 2022-04-28 – 2022-04-29 (×3): 2 g via INTRAVENOUS
  Filled 2022-04-28 (×3): qty 100

## 2022-04-28 MED ORDER — ORAL CARE MOUTH RINSE: 15.0000 mL | Freq: Once | OROMUCOSAL | Status: AC

## 2022-04-28 MED ORDER — ONDANSETRON HCL 4 MG/2ML IJ SOLN
INTRAMUSCULAR | Status: AC
  Filled 2022-04-28: qty 4

## 2022-04-28 MED ORDER — SODIUM CHLORIDE 0.9 % IV SOLN: INTRAVENOUS | Status: DC

## 2022-04-28 MED ORDER — CHLORHEXIDINE GLUCONATE 4 % EX LIQD
60.0000 mL | Freq: Once | CUTANEOUS | Status: AC
  Administered 2022-04-28: 4 via TOPICAL

## 2022-04-28 MED ORDER — ALBUMIN HUMAN 5 % IV SOLN
12.5000 g | Freq: Once | INTRAVENOUS | Status: DC
  Administered 2022-04-28: 12.5 g via INTRAVENOUS

## 2022-04-28 MED ORDER — METOCLOPRAMIDE HCL 5 MG PO TABS: 5.0000 mg | ORAL_TABLET | Freq: Three times a day (TID) | ORAL | Status: DC | PRN

## 2022-04-28 MED ORDER — ROCURONIUM BROMIDE 10 MG/ML (PF) SYRINGE
PREFILLED_SYRINGE | INTRAVENOUS | Status: DC | PRN
  Administered 2022-04-28: 50 mg via INTRAVENOUS
  Administered 2022-04-28: 10 mg via INTRAVENOUS
  Administered 2022-04-28 (×2): 20 mg via INTRAVENOUS

## 2022-04-28 MED ORDER — FENTANYL CITRATE (PF) 100 MCG/2ML IJ SOLN
INTRAMUSCULAR | Status: AC
  Filled 2022-04-28: qty 2

## 2022-04-28 MED ORDER — ONDANSETRON HCL 4 MG/2ML IJ SOLN: 4.0000 mg | Freq: Four times a day (QID) | INTRAMUSCULAR | Status: DC | PRN

## 2022-04-28 MED ORDER — FENTANYL CITRATE (PF) 250 MCG/5ML IJ SOLN
INTRAMUSCULAR | Status: DC | PRN
Start: 2022-04-28 — End: 2022-04-28
  Administered 2022-04-28: 25 ug via INTRAVENOUS
  Administered 2022-04-28: 50 ug via INTRAVENOUS
  Administered 2022-04-28 (×5): 25 ug via INTRAVENOUS

## 2022-04-28 MED ORDER — FENTANYL CITRATE (PF) 100 MCG/2ML IJ SOLN
25.0000 ug | INTRAMUSCULAR | Status: DC | PRN
  Administered 2022-04-28: 50 ug via INTRAVENOUS
  Administered 2022-04-28 (×2): 25 ug via INTRAVENOUS

## 2022-04-28 MED ORDER — DEXAMETHASONE SODIUM PHOSPHATE 10 MG/ML IJ SOLN
INTRAMUSCULAR | Status: DC | PRN
  Administered 2022-04-28: 5 mg via INTRAVENOUS

## 2022-04-28 MED ORDER — PHENYLEPHRINE HCL-NACL 20-0.9 MG/250ML-% IV SOLN
INTRAVENOUS | Status: DC | PRN
  Administered 2022-04-28: 20 ug/min via INTRAVENOUS

## 2022-04-28 MED ORDER — ONDANSETRON HCL 4 MG/2ML IJ SOLN
INTRAMUSCULAR | Status: DC | PRN
  Administered 2022-04-28: 4 mg via INTRAVENOUS

## 2022-04-28 MED ORDER — SERTRALINE HCL 25 MG PO TABS
25.0000 mg | ORAL_TABLET | Freq: Every day | ORAL | Status: DC
  Administered 2022-04-29 – 2022-05-04 (×6): 25 mg via ORAL
  Filled 2022-04-28 (×6): qty 1

## 2022-04-28 MED ORDER — ACETAMINOPHEN 10 MG/ML IV SOLN
INTRAVENOUS | Status: DC | PRN
  Administered 2022-04-28: 1000 mg via INTRAVENOUS

## 2022-04-28 MED ORDER — 0.9 % SODIUM CHLORIDE (POUR BTL) OPTIME
TOPICAL | Status: DC | PRN
  Administered 2022-04-28: 1000 mL

## 2022-04-28 MED ORDER — POVIDONE-IODINE 10 % EX SWAB
2.0000 | Freq: Once | CUTANEOUS | Status: AC
  Administered 2022-04-28: 2 via TOPICAL

## 2022-04-28 MED ORDER — EPHEDRINE 5 MG/ML INJ
INTRAVENOUS | Status: AC
  Filled 2022-04-28: qty 5

## 2022-04-28 MED ORDER — ROCURONIUM BROMIDE 10 MG/ML (PF) SYRINGE
PREFILLED_SYRINGE | INTRAVENOUS | Status: AC
  Filled 2022-04-28: qty 20

## 2022-04-28 MED ORDER — EPHEDRINE SULFATE-NACL 50-0.9 MG/10ML-% IV SOSY
PREFILLED_SYRINGE | INTRAVENOUS | Status: DC | PRN
  Administered 2022-04-28 (×4): 5 mg via INTRAVENOUS

## 2022-04-28 MED ORDER — LATANOPROST 0.005 % OP SOLN
1.0000 [drp] | Freq: Every day | OPHTHALMIC | Status: DC
Start: 2022-04-28 — End: 2022-05-04
  Administered 2022-04-28 – 2022-05-03 (×6): 1 [drp] via OPHTHALMIC
  Filled 2022-04-28: qty 2.5

## 2022-04-28 MED ORDER — DEXAMETHASONE SODIUM PHOSPHATE 10 MG/ML IJ SOLN
INTRAMUSCULAR | Status: AC
  Filled 2022-04-28: qty 2

## 2022-04-28 MED ORDER — METOCLOPRAMIDE HCL 5 MG/ML IJ SOLN: 5.0000 mg | Freq: Three times a day (TID) | INTRAMUSCULAR | Status: DC | PRN

## 2022-04-28 MED ORDER — FENTANYL CITRATE (PF) 250 MCG/5ML IJ SOLN
INTRAMUSCULAR | Status: AC
  Filled 2022-04-28: qty 5

## 2022-04-28 MED ORDER — PHENYLEPHRINE 80 MCG/ML (10ML) SYRINGE FOR IV PUSH (FOR BLOOD PRESSURE SUPPORT)
PREFILLED_SYRINGE | INTRAVENOUS | Status: AC
  Filled 2022-04-28: qty 10

## 2022-04-28 MED ORDER — LIDOCAINE 2% (20 MG/ML) 5 ML SYRINGE
INTRAMUSCULAR | Status: AC
  Filled 2022-04-28: qty 10

## 2022-04-28 MED ORDER — PHENYLEPHRINE 80 MCG/ML (10ML) SYRINGE FOR IV PUSH (FOR BLOOD PRESSURE SUPPORT)
PREFILLED_SYRINGE | INTRAVENOUS | Status: DC | PRN
  Administered 2022-04-28 (×2): 80 ug via INTRAVENOUS

## 2022-04-28 MED ORDER — TRANEXAMIC ACID-NACL 1000-0.7 MG/100ML-% IV SOLN
1000.0000 mg | Freq: Once | INTRAVENOUS | Status: AC
  Administered 2022-04-28: 1000 mg via INTRAVENOUS
  Filled 2022-04-28: qty 100

## 2022-04-28 MED ORDER — CHLORHEXIDINE GLUCONATE 0.12 % MT SOLN
15.0000 mL | Freq: Once | OROMUCOSAL | Status: AC
Start: 2022-04-28 — End: 2022-04-28
  Administered 2022-04-28: 15 mL via OROMUCOSAL
  Filled 2022-04-28: qty 15

## 2022-04-28 MED ORDER — ENOXAPARIN SODIUM 40 MG/0.4ML IJ SOSY
40.0000 mg | PREFILLED_SYRINGE | INTRAMUSCULAR | Status: DC
  Administered 2022-04-29 – 2022-05-04 (×6): 40 mg via SUBCUTANEOUS
  Filled 2022-04-28 (×6): qty 0.4

## 2022-04-28 MED ORDER — PROPOFOL 10 MG/ML IV BOLUS
INTRAVENOUS | Status: AC
  Filled 2022-04-28: qty 20

## 2022-04-28 MED ORDER — ALBUMIN HUMAN 5 % IV SOLN: INTRAVENOUS | Status: DC | PRN

## 2022-04-28 MED ORDER — POLYETHYLENE GLYCOL 3350 17 G PO PACK: 17.0000 g | PACK | Freq: Every day | ORAL | Status: DC | PRN

## 2022-04-28 MED ORDER — PHENYLEPHRINE HCL (PRESSORS) 10 MG/ML IV SOLN: INTRAVENOUS | Status: DC | PRN

## 2022-04-28 MED ORDER — ALBUMIN HUMAN 5 % IV SOLN
INTRAVENOUS | Status: AC
  Filled 2022-04-28: qty 250

## 2022-04-28 MED ORDER — PROPOFOL 10 MG/ML IV BOLUS
INTRAVENOUS | Status: DC | PRN
  Administered 2022-04-28: 80 mg via INTRAVENOUS

## 2022-04-28 MED ORDER — LACTATED RINGERS IV SOLN: INTRAVENOUS | Status: DC

## 2022-04-28 MED ORDER — ONDANSETRON HCL 4 MG PO TABS: 4.0000 mg | ORAL_TABLET | Freq: Four times a day (QID) | ORAL | Status: DC | PRN

## 2022-04-28 MED ORDER — SUGAMMADEX SODIUM 200 MG/2ML IV SOLN
INTRAVENOUS | Status: DC | PRN
  Administered 2022-04-28: 200 mg via INTRAVENOUS

## 2022-04-28 MED ORDER — ACETAMINOPHEN 10 MG/ML IV SOLN
INTRAVENOUS | Status: AC
  Filled 2022-04-28: qty 100

## 2022-04-28 MED ORDER — BRIMONIDINE TARTRATE 0.2 % OP SOLN
1.0000 [drp] | Freq: Two times a day (BID) | OPHTHALMIC | Status: DC
  Administered 2022-04-28 – 2022-05-04 (×12): 1 [drp] via OPHTHALMIC
  Filled 2022-04-28: qty 5

## 2022-04-28 SURGICAL SUPPLY — 87 items
BAG COUNTER SPONGE SURGICOUNT (BAG) ×1 IMPLANT
BANDAGE ESMARK 6X9 LF (GAUZE/BANDAGES/DRESSINGS) ×1 IMPLANT
BIT DRILL SHORT 4.2 (BIT) IMPLANT
BIT DRILL TAPERED 10 (BIT) IMPLANT
BLADE SURG 10 STRL SS (BLADE) ×2 IMPLANT
BNDG COHESIVE 4X5 TAN STRL (GAUZE/BANDAGES/DRESSINGS) ×1 IMPLANT
BNDG COHESIVE 6X5 TAN STRL LF (GAUZE/BANDAGES/DRESSINGS) ×1 IMPLANT
BNDG ELASTIC 4X5.8 VLCR STR LF (GAUZE/BANDAGES/DRESSINGS) ×1 IMPLANT
BNDG ELASTIC 6X5.8 VLCR STR LF (GAUZE/BANDAGES/DRESSINGS) ×1 IMPLANT
BNDG ESMARK 6X9 LF (GAUZE/BANDAGES/DRESSINGS) ×1
BNDG GAUZE DERMACEA FLUFF 4 (GAUZE/BANDAGES/DRESSINGS) ×2 IMPLANT
BRUSH SCRUB EZ PLAIN DRY (MISCELLANEOUS) ×2 IMPLANT
CHLORAPREP W/TINT 26 (MISCELLANEOUS) ×1 IMPLANT
COVER SURGICAL LIGHT HANDLE (MISCELLANEOUS) ×2 IMPLANT
CUFF TOURN SGL QUICK 18X4 (TOURNIQUET CUFF) IMPLANT
CUFF TOURN SGL QUICK 24 (TOURNIQUET CUFF)
CUFF TOURN SGL QUICK 34 (TOURNIQUET CUFF)
CUFF TRNQT CYL 24X4X16.5-23 (TOURNIQUET CUFF) IMPLANT
CUFF TRNQT CYL 34X4.125X (TOURNIQUET CUFF) IMPLANT
DRAPE C-ARM 35X43 STRL (DRAPES) ×1 IMPLANT
DRAPE C-ARM 42X72 X-RAY (DRAPES) IMPLANT
DRAPE C-ARMOR (DRAPES) ×1 IMPLANT
DRAPE HALF SHEET 40X57 (DRAPES) ×2 IMPLANT
DRAPE IMP U-DRAPE 54X76 (DRAPES) ×2 IMPLANT
DRAPE INCISE IOBAN 66X45 STRL (DRAPES) ×1 IMPLANT
DRAPE ORTHO SPLIT 77X108 STRL (DRAPES) ×2
DRAPE SURG 17X23 STRL (DRAPES) ×1 IMPLANT
DRAPE SURG ORHT 6 SPLT 77X108 (DRAPES) ×2 IMPLANT
DRAPE U-SHAPE 47X51 STRL (DRAPES) ×1 IMPLANT
DRILL BIT SHORT 4.2 (BIT) ×2
DRSG ADAPTIC 3X8 NADH LF (GAUZE/BANDAGES/DRESSINGS) ×1 IMPLANT
DRSG MEPILEX BORDER 4X4 (GAUZE/BANDAGES/DRESSINGS) ×3 IMPLANT
DRSG MEPILEX BORDER 4X8 (GAUZE/BANDAGES/DRESSINGS) ×1 IMPLANT
ELECT REM PT RETURN 9FT ADLT (ELECTROSURGICAL) ×1
ELECTRODE REM PT RTRN 9FT ADLT (ELECTROSURGICAL) ×1 IMPLANT
GAUZE SPONGE 4X4 12PLY STRL (GAUZE/BANDAGES/DRESSINGS) ×1 IMPLANT
GLOVE BIO SURGEON STRL SZ 6.5 (GLOVE) ×3 IMPLANT
GLOVE BIO SURGEON STRL SZ7.5 (GLOVE) ×4 IMPLANT
GLOVE BIOGEL PI IND STRL 6.5 (GLOVE) ×1 IMPLANT
GLOVE BIOGEL PI IND STRL 7.5 (GLOVE) ×1 IMPLANT
GOWN STRL REUS W/ TWL LRG LVL3 (GOWN DISPOSABLE) ×3 IMPLANT
GOWN STRL REUS W/ TWL XL LVL3 (GOWN DISPOSABLE) ×1 IMPLANT
GOWN STRL REUS W/TWL LRG LVL3 (GOWN DISPOSABLE) ×3
GOWN STRL REUS W/TWL XL LVL3 (GOWN DISPOSABLE) ×1
GUIDEWIRE 3.2X400 (WIRE) IMPLANT
KIT BASIN OR (CUSTOM PROCEDURE TRAY) ×1 IMPLANT
KIT TURNOVER KIT B (KITS) ×1 IMPLANT
MANIFOLD NEPTUNE II (INSTRUMENTS) ×1 IMPLANT
MEPILEX BORDER 4 X 4 IN IMPLANT
NAIL IM TFNA 12X400 LT (Nail) IMPLANT
NDL 22X1.5 STRL (OR ONLY) (MISCELLANEOUS) IMPLANT
NEEDLE 22X1.5 STRL (OR ONLY) (MISCELLANEOUS) IMPLANT
NS IRRIG 1000ML POUR BTL (IV SOLUTION) ×1 IMPLANT
PACK GENERAL/GYN (CUSTOM PROCEDURE TRAY) ×1 IMPLANT
PACK ORTHO EXTREMITY (CUSTOM PROCEDURE TRAY) ×1 IMPLANT
PAD ARMBOARD 7.5X6 YLW CONV (MISCELLANEOUS) ×2 IMPLANT
PADDING CAST COTTON 6X4 STRL (CAST SUPPLIES) ×3 IMPLANT
PUTTY BONE HEMOSTAT MONTAGE 2C (Putty) IMPLANT
PUTTY BONE HEMOSTAT MONTAGE 5C (Putty) IMPLANT
REAMER ROD 3.8 BALL TIP 3X950 (ORTHOPEDIC DISPOSABLE SUPPLIES) IMPLANT
REAMER ROD DEEP FLUTE 2.5X950 (INSTRUMENTS) IMPLANT
SCREW FENES TFNA 95 (Screw) IMPLANT
SCREW LOCK 58X5X IM NL (Screw) IMPLANT
SCREW LOCK IM 44X5X (Screw) IMPLANT
SCREW LOCK IM NAIL 5X44 (Screw) ×1 IMPLANT
SCREW LOCK IM NAIL 5X58 (Screw) ×1 IMPLANT
SPONGE T-LAP 18X18 ~~LOC~~+RFID (SPONGE) ×1 IMPLANT
STAPLER VISISTAT 35W (STAPLE) ×1 IMPLANT
STOCKINETTE IMPERVIOUS LG (DRAPES) ×1 IMPLANT
STRIP CLOSURE SKIN 1/2X4 (GAUZE/BANDAGES/DRESSINGS) IMPLANT
SUCTION FRAZIER HANDLE 10FR (MISCELLANEOUS)
SUCTION TUBE FRAZIER 10FR DISP (MISCELLANEOUS) IMPLANT
SUT ETHILON 3 0 PS 1 (SUTURE) ×1 IMPLANT
SUT MNCRL AB 3-0 PS2 18 (SUTURE) ×1 IMPLANT
SUT MON AB 2-0 CT1 36 (SUTURE) ×1 IMPLANT
SUT PDS AB 2-0 CT1 27 (SUTURE) IMPLANT
SUT VIC AB 0 CT1 27 (SUTURE)
SUT VIC AB 0 CT1 27XBRD ANBCTR (SUTURE) IMPLANT
SUT VIC AB 2-0 CT1 27 (SUTURE) ×2
SUT VIC AB 2-0 CT1 TAPERPNT 27 (SUTURE) ×2 IMPLANT
SYR CONTROL 10ML LL (SYRINGE) IMPLANT
TOWEL GREEN STERILE (TOWEL DISPOSABLE) ×2 IMPLANT
TOWEL GREEN STERILE FF (TOWEL DISPOSABLE) ×2 IMPLANT
TUBE CONNECTING 12X1/4 (SUCTIONS) ×1 IMPLANT
UNDERPAD 30X36 HEAVY ABSORB (UNDERPADS AND DIAPERS) ×1 IMPLANT
WATER STERILE IRR 1000ML POUR (IV SOLUTION) ×2 IMPLANT
YANKAUER SUCT BULB TIP NO VENT (SUCTIONS) ×1 IMPLANT

## 2022-04-28 NOTE — Interval H&P Note (Signed)
History and Physical Interval Note:  04/28/2022 2:02 PM  Melanie Carrillo  has presented today for surgery, with the diagnosis of Left femur nonunion.  The various methods of treatment have been discussed with the patient and family. After consideration of risks, benefits and other options for treatment, the patient has consented to  Procedure(s): HARDWARE REMOVAL (Left) REVISION FIXATION OF LEFT FEMUR FRACTURE (Left) as a surgical intervention.  The patient's history has been reviewed, patient examined, no change in status, stable for surgery.  I have reviewed the patient's chart and labs.  Questions were answered to the patient's satisfaction.     Lennette Bihari P Rosy Estabrook

## 2022-04-28 NOTE — Anesthesia Postprocedure Evaluation (Signed)
Anesthesia Post Note  Patient: Melanie Carrillo  Procedure(s) Performed: HARDWARE REMOVAL (Left) REVISION FIXATION OF LEFT FEMUR FRACTURE (Left)     Patient location during evaluation: PACU Anesthesia Type: General Level of consciousness: awake and alert Pain management: pain level controlled Vital Signs Assessment: post-procedure vital signs reviewed and stable Respiratory status: spontaneous breathing, nonlabored ventilation, respiratory function stable and patient connected to nasal cannula oxygen Cardiovascular status: blood pressure returned to baseline and stable Postop Assessment: no apparent nausea or vomiting Anesthetic complications: no   No notable events documented.  Last Vitals:  Vitals:   04/28/22 1947 04/28/22 2028  BP: 97/66 125/76  Pulse: 97 (!) 108  Resp: 18 16  Temp: 36.7 C 36.8 C  SpO2: 97% 98%    Last Pain:  Vitals:   04/28/22 2028  TempSrc: Oral  PainSc: 0-No pain                 Lavarr President,W. EDMOND

## 2022-04-28 NOTE — Anesthesia Procedure Notes (Signed)
Procedure Name: Intubation Date/Time: 04/28/2022 3:26 PM  Performed by: Reece Agar, CRNAPre-anesthesia Checklist: Patient identified, Emergency Drugs available, Suction available and Patient being monitored Patient Re-evaluated:Patient Re-evaluated prior to induction Oxygen Delivery Method: Circle System Utilized Preoxygenation: Pre-oxygenation with 100% oxygen Induction Type: IV induction Ventilation: Mask ventilation without difficulty Laryngoscope Size: Mac and 3 Grade View: Grade I Tube type: Oral Number of attempts: 1 Airway Equipment and Method: Stylet and Oral airway Placement Confirmation: ETT inserted through vocal cords under direct vision, positive ETCO2 and breath sounds checked- equal and bilateral Secured at: 20 cm Tube secured with: Tape Dental Injury: Teeth and Oropharynx as per pre-operative assessment

## 2022-04-28 NOTE — Progress Notes (Signed)
PROGRESS NOTE    Melanie Carrillo  NWG:[id number] DOB: July 07, 1935 DOA: 04/27/2022 PCP: Gayland Curry, MD   Brief Narrative: 87 year old female with ORIF and intramedullary rod in May 2023 for intertrochanteric fracture readmitted with fracture of the intramedullary rod at the level of the interlocking screw.  Ortho consulted.  Assessment & Plan:   Principal Problem:   Loosening of intramedullary nail (HCC) Active Problems:   Interstitial lung disease ([place])   Essential hypertension   GERD (gastroesophageal reflux disease)   Hyperlipidemia, mixed   Obstructive sleep apnea   #1 fracture of the left intramedullary rod-seen by Ortho and plan for surgery today.  #2 history of essential hypertension  #3 interstitial lung disease stable on room air  #4 obstructive sleep apnea stable  #5 glaucoma restart home eyedrops  #6 hyperlipidemia on statins  #7 GERD on Protonix    Estimated body mass index is 28 kg/m as calculated from the following:   Height as of this encounter: '5\' 4"'$  (1.626 m).   Weight as of this encounter: 74 kg.  DVT prophylaxis:  Code Status: Full code Family Communication: Discussed with daughter at bedside Disposition Plan:  Status is: Inpatient Remains inpatient appropriate because: Patient with left hip fracture going to the OR today   Consultants:  Ortho Procedures none antimicrobials: None  Subjective: Patient resting in bed does not have any pain if she is not moving daughter by the bedside patient has been n.p.o. hoping to have surgery done today  Objective: Vitals:   04/28/22 0302 04/28/22 0400 04/28/22 0436 04/28/22 0819  BP: (!) 126/59 (!) 127/54 (!) 141/55 136/60  Pulse: 80 73 74 65  Resp: '20 17  18  '$ Temp: 98 F (36.7 C)  97.7 F (36.5 C) 97.8 F (36.6 C)  TempSrc: Oral  Oral Oral  SpO2: 96% 95% 92% 97%  Weight:      Height:        Intake/Output Summary (Last 24 hours) at 04/28/2022 1325 Last data filed at 04/28/2022 1300 Gross per  24 hour  Intake 459.08 ml  Output 400 ml  Net 59.08 ml   Filed Weights   04/27/22 1357  Weight: 74 kg    Examination:  General exam: Appears in no acute distress  respiratory system: Clear to auscultation. Respiratory effort normal. Cardiovascular system: S1 & S2 heard, RRR. No JVD, murmurs, rubs, gallops or clicks. No pedal edema. Gastrointestinal system: Abdomen is nondistended, soft and nontender. No organomegaly or masses felt. Normal bowel sounds heard. Central nervous system: Alert and oriented. No focal neurological deficits. Extremities: No edema. Skin: No rashes, lesions or ulcers Psychiatry: Judgement and insight appear normal. Mood & affect appropriate.     Data Reviewed: I have personally reviewed following labs and imaging studies  CBC: Recent Labs  Lab 04/27/22 2012  WBC 6.8  HGB 13.6  HCT 42.1  MCV 103.2*  PLT 578   Basic Metabolic Panel: Recent Labs  Lab 04/27/22 2012  NA 138  K 4.0  CL 107  CO2 24  GLUCOSE 119*  BUN 10  CREATININE 0.65  CALCIUM 10.4*   GFR: Estimated Creatinine Clearance: 50.6 mL/min (by C-G formula based on SCr of 0.65 mg/dL). Liver Function Tests: No results for input(s): "AST", "ALT", "ALKPHOS", "BILITOT", "PROT", "ALBUMIN" in the last 168 hours. No results for input(s): "LIPASE", "AMYLASE" in the last 168 hours. No results for input(s): "AMMONIA" in the last 168 hours. Coagulation Profile: No results for input(s): "INR", "PROTIME" in the last 168  hours. Cardiac Enzymes: No results for input(s): "CKTOTAL", "CKMB", "CKMBINDEX", "TROPONINI" in the last 168 hours. BNP (last 3 results) No results for input(s): "PROBNP" in the last 8760 hours. HbA1C: No results for input(s): "HGBA1C" in the last 72 hours. CBG: No results for input(s): "GLUCAP" in the last 168 hours. Lipid Profile: No results for input(s): "CHOL", "HDL", "LDLCALC", "TRIG", "CHOLHDL", "LDLDIRECT" in the last 72 hours. Thyroid Function Tests: No results  for input(s): "TSH", "T4TOTAL", "FREET4", "T3FREE", "THYROIDAB" in the last 72 hours. Anemia Panel: No results for input(s): "VITAMINB12", "FOLATE", "FERRITIN", "TIBC", "IRON", "RETICCTPCT" in the last 72 hours. Sepsis Labs: No results for input(s): "PROCALCITON", "LATICACIDVEN" in the last 168 hours.  Recent Results (from the past 240 hour(s))  Surgical pcr screen     Status: None   Collection Time: 04/28/22 10:45 AM   Specimen: Nasal Mucosa; Nasal Swab  Result Value Ref Range Status   MRSA, PCR NEGATIVE NEGATIVE Final   Staphylococcus aureus NEGATIVE NEGATIVE Final    Comment: (NOTE) The Xpert SA Assay (FDA approved for NASAL specimens in patients 49 years of age and older), is one component of a comprehensive surveillance program. It is not intended to diagnose infection nor to guide or monitor treatment. Performed at [facility], [place] [address], [place], [place] [address]          Radiology Studies: CT Hip Left Wo Contrast  Result Date: 04/27/2022 CLINICAL DATA:  Left hip pain after recent fall. Left hip surgery for fracture 12/26/2021. EXAM: CT OF THE LEFT HIP WITHOUT CONTRAST TECHNIQUE: Multidetector CT imaging of the left hip was performed according to the standard protocol. Multiplanar CT image reconstructions were also generated. RADIATION DOSE REDUCTION: This exam was performed according to the departmental dose-optimization program which includes automated exposure control, adjustment of the mA and/or kV according to patient size and/or use of iterative reconstruction technique. COMPARISON:  Preoperative radiographs 12/25/2021. Intraoperative radiographs 12/26/2021. None recent. FINDINGS: Bones/Joint/Cartilage Status post long left femoral intramedullary nail fixation of a comminuted subtrochanteric left femur fracture. The intramedullary nail is angulated medially by 21 degrees at the level of the proximal interconnecting screws and appears fractured. In addition, the  more inferior interconnecting screw appears fractured. The entire length of the intramedullary nail is not imaged. Comminuted subtrochanteric left femur fracture extending into the intratrochanteric region shows increased displacement compared with the intraoperative images. It is uncertain as to whether this reflects delayed healing of the original injury with interval increased displacement or a new fracture. The lesser trochanter remains medially displaced. The femoral head is intact. Mild underlying left hip degenerative changes. There is a small to moderate left hip joint effusion without significant hip arthropathy. Ligaments Suboptimally assessed by CT. Muscles and Tendons No intramuscular hematoma or significant atrophy. Soft tissues Postsurgical changes lateral to the greater trochanter with granulation tissue or ill-defined fluid. No evidence of acute hematoma. Prominent diverticular changes are noted throughout the sigmoid colon. IMPRESSION: 1. Interval hardware failure status post ORIF of subtrochanteric left femur fracture. The intramedullary nail appears fractured and angulated at the level of the proximal interlocking screws, and the more inferior interlocking screw appears fractured. 2. Increased displacement of the comminuted subtrochanteric/intertrochanteric femur fracture compared with intraoperative radiographs of 4 months ago. Findings favor delayed fracture healing with interval increased displacement, although a new fracture is possible. No dislocation or fracture of the left hemipelvis. 3. Nonspecific postsurgical fluid collection lateral to the greater trochanter. No evidence of acute hematoma. Electronically Signed  By: Richardean Sale M.D.   On: 04/27/2022 15:03        Scheduled Meds:  acetaminophen  1,000 mg Oral Q6H   brimonidine  1 drop Both Eyes BID   latanoprost  1 drop Both Eyes QHS   montelukast  10 mg Oral QHS   pantoprazole  40 mg Oral Daily   povidone-iodine  2  Application Topical Once   pravastatin  40 mg Oral q1800   sertraline  25 mg Oral Daily   Continuous Infusions:   ceFAZolin (ANCEF) IV     tranexamic acid       LOS: 1 day    Time spent: 35 minutes    Georgette Shell, MD 04/28/2022, 1:25 PM

## 2022-04-28 NOTE — Progress Notes (Signed)
Orthopedic Tech Progress Note Patient Details:  Melanie Carrillo 04/11/1935 [id number]  Patient ID: [name], female   DOB: 08-04-1935, 87 y.o.   MRN: [id number] No OHF; over age limit. [place] 04/28/2022, 9:30 PM

## 2022-04-28 NOTE — Plan of Care (Signed)

## 2022-04-28 NOTE — Transfer of Care (Signed)
Immediate Anesthesia Transfer of Care Note  Patient: Melanie Carrillo  Procedure(s) Performed: HARDWARE REMOVAL (Left) REVISION FIXATION OF LEFT FEMUR FRACTURE (Left)  Patient Location: PACU  Anesthesia Type:General  Level of Consciousness: awake and alert   Airway & Oxygen Therapy: Patient Spontanous Breathing and Patient connected to face mask oxygen  Post-op Assessment: Report given to RN and Post -op Vital signs reviewed and stable  Post vital signs: Reviewed and stable  Last Vitals:  Vitals Value Taken Time  BP 91/71 04/28/22 1847  Temp    Pulse 96 04/28/22 1847  Resp 16 04/28/22 1847  SpO2 94 % 04/28/22 1847  Vitals shown include unvalidated device data.  Last Pain:  Vitals:   04/28/22 1330  TempSrc: Oral  PainSc: 0-No pain         Complications: No notable events documented.

## 2022-04-28 NOTE — Consult Note (Signed)
Reason for Consult:Left hip hardware failure Referring Physician: Jacki Cones Time called: 0830 Time at bedside: 0856   Melanie Carrillo is an 87 y.o. female.  HPI: Melanie Carrillo underwent IMN left hip in May. She was doing well until yesterday. She was doing her rehab exercises which included marching in place when she felt a pop in her knee and had immediate pain in her hip and could no longer bear weight. She was brought to the ED where x-rays showed a break in her IMN. She was transferred to [facility] for definitive care. She lives at home with her daughter and ambulates with a cane.  Past Medical History:  Diagnosis Date   Colon cancer ([place])    Hypertension     Past Surgical History:  Procedure Laterality Date   INTRAMEDULLARY (IM) NAIL INTERTROCHANTERIC Left 12/26/2021   Procedure: INTRAMEDULLARY (IM) NAIL INTERTROCHANTRIC;  Surgeon: Renee Harder, MD;  Location: [facility] ORS;  Service: Orthopedics;  Laterality: Left;    No family history on file.  Social History:  reports that she has never smoked. She has never used smokeless tobacco. She reports that she does not drink alcohol and does not use drugs.  Allergies: No Known Allergies  Medications: I have reviewed the patient's current medications.  Results for orders placed or performed during the hospital encounter of 04/27/22 (from the past 48 hour(s))  CBC     Status: Abnormal   Collection Time: 04/27/22  8:12 PM  Result Value Ref Range   WBC 6.8 4.0 - 10.5 K/uL   RBC 4.08 3.87 - 5.11 MIL/uL   Hemoglobin 13.6 12.0 - 15.0 g/dL   HCT 42.1 36.0 - 46.0 %   MCV 103.2 (H) 80.0 - 100.0 fL   MCH 33.3 26.0 - 34.0 pg   MCHC 32.3 30.0 - 36.0 g/dL   RDW 12.4 11.5 - 15.5 %   Platelets 225 150 - 400 K/uL   nRBC 0.0 0.0 - 0.2 %    Comment: Performed at [facility], [place] [address], [place], [place] [address]  Basic metabolic panel     Status: Abnormal   Collection Time: 04/27/22  8:12 PM  Result Value Ref Range   Sodium 138  135 - 145 mmol/L   Potassium 4.0 3.5 - 5.1 mmol/L   Chloride 107 98 - 111 mmol/L   CO2 24 22 - 32 mmol/L   Glucose, Bld 119 (H) 70 - 99 mg/dL    Comment: Glucose reference range applies only to samples taken after fasting for at least 8 hours.   BUN 10 8 - 23 mg/dL   Creatinine, Ser 0.65 0.44 - 1.00 mg/dL   Calcium 10.4 (H) 8.9 - 10.3 mg/dL   GFR, Estimated >60 >60 mL/min    Comment: (NOTE) Calculated using the CKD-EPI Creatinine Equation (2021)    Anion gap 7 5 - 15    Comment: Performed at [facility] [address] [place], [place] [address]    CT Hip Left Wo Contrast  Result Date: 04/27/2022 CLINICAL DATA:  Left hip pain after recent fall. Left hip surgery for fracture 12/26/2021. EXAM: CT OF THE LEFT HIP WITHOUT CONTRAST TECHNIQUE: Multidetector CT imaging of the left hip was performed according to the standard protocol. Multiplanar CT image reconstructions were also generated. RADIATION DOSE REDUCTION: This exam was performed according to the departmental dose-optimization program which includes automated exposure control, adjustment of the mA and/or kV according to patient size and/or use of iterative reconstruction  technique. COMPARISON:  Preoperative radiographs 12/25/2021. Intraoperative radiographs 12/26/2021. None recent. FINDINGS: Bones/Joint/Cartilage Status post long left femoral intramedullary nail fixation of a comminuted subtrochanteric left femur fracture. The intramedullary nail is angulated medially by 21 degrees at the level of the proximal interconnecting screws and appears fractured. In addition, the more inferior interconnecting screw appears fractured. The entire length of the intramedullary nail is not imaged. Comminuted subtrochanteric left femur fracture extending into the intratrochanteric region shows increased displacement compared with the intraoperative images. It is uncertain as to whether this reflects delayed healing of the original injury with interval  increased displacement or a new fracture. The lesser trochanter remains medially displaced. The femoral head is intact. Mild underlying left hip degenerative changes. There is a small to moderate left hip joint effusion without significant hip arthropathy. Ligaments Suboptimally assessed by CT. Muscles and Tendons No intramuscular hematoma or significant atrophy. Soft tissues Postsurgical changes lateral to the greater trochanter with granulation tissue or ill-defined fluid. No evidence of acute hematoma. Prominent diverticular changes are noted throughout the sigmoid colon. IMPRESSION: 1. Interval hardware failure status post ORIF of subtrochanteric left femur fracture. The intramedullary nail appears fractured and angulated at the level of the proximal interlocking screws, and the more inferior interlocking screw appears fractured. 2. Increased displacement of the comminuted subtrochanteric/intertrochanteric femur fracture compared with intraoperative radiographs of 4 months ago. Findings favor delayed fracture healing with interval increased displacement, although a new fracture is possible. No dislocation or fracture of the left hemipelvis. 3. Nonspecific postsurgical fluid collection lateral to the greater trochanter. No evidence of acute hematoma. Electronically Signed   By: Richardean Sale M.D.   On: 04/27/2022 15:03    Review of Systems  HENT:  Negative for ear discharge, ear pain, hearing loss and tinnitus.   Eyes:  Negative for photophobia and pain.  Respiratory:  Negative for cough and shortness of breath.   Cardiovascular:  Negative for chest pain.  Gastrointestinal:  Negative for abdominal pain, nausea and vomiting.  Genitourinary:  Negative for dysuria, flank pain, frequency and urgency.  Musculoskeletal:  Positive for arthralgias (Left hip). Negative for back pain, myalgias and neck pain.  Neurological:  Negative for dizziness and headaches.  Hematological:  Does not bruise/bleed easily.   Psychiatric/Behavioral:  The patient is not nervous/anxious.    Blood pressure 136/60, pulse 65, temperature 97.8 F (36.6 C), temperature source Oral, resp. rate 18, height '5\' 4"'$  (1.626 m), weight 74 kg, SpO2 97 %. Physical Exam Constitutional:      General: She is not in acute distress.    Appearance: She is well-developed. She is not diaphoretic.  HENT:     Head: Normocephalic and atraumatic.  Eyes:     General: No scleral icterus.       Right eye: No discharge.        Left eye: No discharge.     Conjunctiva/sclera: Conjunctivae normal.  Cardiovascular:     Rate and Rhythm: Normal rate and regular rhythm.  Pulmonary:     Effort: Pulmonary effort is normal. No respiratory distress.  Musculoskeletal:     Cervical back: Normal range of motion.     Comments: LLE No traumatic wounds, ecchymosis, or rash  Mod TTP hip  No knee or ankle effusion  Knee stable to varus/ valgus and anterior/posterior stress  Sens DPN, SPN, TN intact  Motor EHL, ext, flex, evers 5/5  DP 2+, PT 2+, No significant edema  Skin:    General: Skin is warm and  dry.  Neurological:     Mental Status: She is alert.  Psychiatric:        Mood and Affect: Mood normal.        Behavior: Behavior normal.     Assessment/Plan: Left hip hardware failure -- Plan IMN revision vs ORIF today by Dr. Doreatha Martin if there's time or possibly tomorrow with Dr. Marcelino Scot. Please keep NPO for now. Multiple medical problems including history of hypertension, GERD, and history of interstitial lung disease -- per primary service    Lisette Abu, PA-C Orthopedic Surgery [phone] 04/28/2022, 9:04 AM

## 2022-04-28 NOTE — H&P (View-Only) (Signed)
Reason for Consult:Left hip hardware failure Referring Physician: Jacki Cones Time called: 0830 Time at bedside: 0856   Melanie Carrillo is an 87 y.o. female.  HPI: Landa underwent IMN left hip in May. She was doing well until yesterday. She was doing her rehab exercises which included marching in place when she felt a pop in her knee and had immediate pain in her hip and could no longer bear weight. She was brought to the ED where x-rays showed a break in her IMN. She was transferred to [facility] for definitive care. She lives at home with her daughter and ambulates with a cane.  Past Medical History:  Diagnosis Date   Colon cancer ([place])    Hypertension     Past Surgical History:  Procedure Laterality Date   INTRAMEDULLARY (IM) NAIL INTERTROCHANTERIC Left 12/26/2021   Procedure: INTRAMEDULLARY (IM) NAIL INTERTROCHANTRIC;  Surgeon: Renee Harder, MD;  Location: [facility] ORS;  Service: Orthopedics;  Laterality: Left;    No family history on file.  Social History:  reports that she has never smoked. She has never used smokeless tobacco. She reports that she does not drink alcohol and does not use drugs.  Allergies: No Known Allergies  Medications: I have reviewed the patient's current medications.  Results for orders placed or performed during the hospital encounter of 04/27/22 (from the past 48 hour(s))  CBC     Status: Abnormal   Collection Time: 04/27/22  8:12 PM  Result Value Ref Range   WBC 6.8 4.0 - 10.5 K/uL   RBC 4.08 3.87 - 5.11 MIL/uL   Hemoglobin 13.6 12.0 - 15.0 g/dL   HCT 42.1 36.0 - 46.0 %   MCV 103.2 (H) 80.0 - 100.0 fL   MCH 33.3 26.0 - 34.0 pg   MCHC 32.3 30.0 - 36.0 g/dL   RDW 12.4 11.5 - 15.5 %   Platelets 225 150 - 400 K/uL   nRBC 0.0 0.0 - 0.2 %    Comment: Performed at [facility], [place] [address] [place], [place] [address]  Basic metabolic panel     Status: Abnormal   Collection Time: 04/27/22  8:12 PM  Result Value Ref Range   Sodium 138  135 - 145 mmol/L   Potassium 4.0 3.5 - 5.1 mmol/L   Chloride 107 98 - 111 mmol/L   CO2 24 22 - 32 mmol/L   Glucose, Bld 119 (H) 70 - 99 mg/dL    Comment: Glucose reference range applies only to samples taken after fasting for at least 8 hours.   BUN 10 8 - 23 mg/dL   Creatinine, Ser 0.65 0.44 - 1.00 mg/dL   Calcium 10.4 (H) 8.9 - 10.3 mg/dL   GFR, Estimated >60 >60 mL/min    Comment: (NOTE) Calculated using the CKD-EPI Creatinine Equation (2021)    Anion gap 7 5 - 15    Comment: Performed at [facility] [address] [place], [place] [address]    CT Hip Left Wo Contrast  Result Date: 04/27/2022 CLINICAL DATA:  Left hip pain after recent fall. Left hip surgery for fracture 12/26/2021. EXAM: CT OF THE LEFT HIP WITHOUT CONTRAST TECHNIQUE: Multidetector CT imaging of the left hip was performed according to the standard protocol. Multiplanar CT image reconstructions were also generated. RADIATION DOSE REDUCTION: This exam was performed according to the departmental dose-optimization program which includes automated exposure control, adjustment of the mA and/or kV according to patient size and/or use of iterative reconstruction  technique. COMPARISON:  Preoperative radiographs 12/25/2021. Intraoperative radiographs 12/26/2021. None recent. FINDINGS: Bones/Joint/Cartilage Status post long left femoral intramedullary nail fixation of a comminuted subtrochanteric left femur fracture. The intramedullary nail is angulated medially by 21 degrees at the level of the proximal interconnecting screws and appears fractured. In addition, the more inferior interconnecting screw appears fractured. The entire length of the intramedullary nail is not imaged. Comminuted subtrochanteric left femur fracture extending into the intratrochanteric region shows increased displacement compared with the intraoperative images. It is uncertain as to whether this reflects delayed healing of the original injury with interval  increased displacement or a new fracture. The lesser trochanter remains medially displaced. The femoral head is intact. Mild underlying left hip degenerative changes. There is a small to moderate left hip joint effusion without significant hip arthropathy. Ligaments Suboptimally assessed by CT. Muscles and Tendons No intramuscular hematoma or significant atrophy. Soft tissues Postsurgical changes lateral to the greater trochanter with granulation tissue or ill-defined fluid. No evidence of acute hematoma. Prominent diverticular changes are noted throughout the sigmoid colon. IMPRESSION: 1. Interval hardware failure status post ORIF of subtrochanteric left femur fracture. The intramedullary nail appears fractured and angulated at the level of the proximal interlocking screws, and the more inferior interlocking screw appears fractured. 2. Increased displacement of the comminuted subtrochanteric/intertrochanteric femur fracture compared with intraoperative radiographs of 4 months ago. Findings favor delayed fracture healing with interval increased displacement, although a new fracture is possible. No dislocation or fracture of the left hemipelvis. 3. Nonspecific postsurgical fluid collection lateral to the greater trochanter. No evidence of acute hematoma. Electronically Signed   By: Richardean Sale M.D.   On: 04/27/2022 15:03    Review of Systems  HENT:  Negative for ear discharge, ear pain, hearing loss and tinnitus.   Eyes:  Negative for photophobia and pain.  Respiratory:  Negative for cough and shortness of breath.   Cardiovascular:  Negative for chest pain.  Gastrointestinal:  Negative for abdominal pain, nausea and vomiting.  Genitourinary:  Negative for dysuria, flank pain, frequency and urgency.  Musculoskeletal:  Positive for arthralgias (Left hip). Negative for back pain, myalgias and neck pain.  Neurological:  Negative for dizziness and headaches.  Hematological:  Does not bruise/bleed easily.   Psychiatric/Behavioral:  The patient is not nervous/anxious.    Blood pressure 136/60, pulse 65, temperature 97.8 F (36.6 C), temperature source Oral, resp. rate 18, height '5\' 4"'$  (1.626 m), weight 74 kg, SpO2 97 %. Physical Exam Constitutional:      General: She is not in acute distress.    Appearance: She is well-developed. She is not diaphoretic.  HENT:     Head: Normocephalic and atraumatic.  Eyes:     General: No scleral icterus.       Right eye: No discharge.        Left eye: No discharge.     Conjunctiva/sclera: Conjunctivae normal.  Cardiovascular:     Rate and Rhythm: Normal rate and regular rhythm.  Pulmonary:     Effort: Pulmonary effort is normal. No respiratory distress.  Musculoskeletal:     Cervical back: Normal range of motion.     Comments: LLE No traumatic wounds, ecchymosis, or rash  Mod TTP hip  No knee or ankle effusion  Knee stable to varus/ valgus and anterior/posterior stress  Sens DPN, SPN, TN intact  Motor EHL, ext, flex, evers 5/5  DP 2+, PT 2+, No significant edema  Skin:    General: Skin is warm and  dry.  Neurological:     Mental Status: She is alert.  Psychiatric:        Mood and Affect: Mood normal.        Behavior: Behavior normal.     Assessment/Plan: Left hip hardware failure -- Plan IMN revision vs ORIF today by Dr. Doreatha Martin if there's time or possibly tomorrow with Dr. Marcelino Scot. Please keep NPO for now. Multiple medical problems including history of hypertension, GERD, and history of interstitial lung disease -- per primary service    Lisette Abu, PA-C Orthopedic Surgery [phone] 04/28/2022, 9:04 AM

## 2022-04-28 NOTE — Op Note (Signed)
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 Item Serial No. Manufacturer Lot No. LRB No. Used Action  12 mm/130 deg ti cann tfna 423m/left      [id number] 1 Implanted  PUTTY BONE HEMOSTAT MONTAGE 2C - [id number] PUTTY BONE HEMOSTAT MONTAGE 2C  [id number] 1 Implanted  montage     [id number] Left 1 Implanted  SCREW FENES TFNA 95 - [id number] SCREW FENES TFNA 95  [employer]  Left 1 Implanted  SCREW LOCK IM NAIL 5X58 - [id number] SCREW LOCK IM NAIL 5X58  [employer]PU[employer] ORTH[employer]  Left 1 Implanted  SCREW LOCK IM NAIL 5X44 - [id number] SCREW LOCK IM NAIL 5X44  [employer] ORTH[employer]  Left 1 Implanted     Indications for Surgery: 86year old female who underwent fixation of subtrochanteric femur fracture in May 2023.  She subsequently had catastrophic failure of her implant.  She fell into various deformity.  Due to the unstable nature of her injury I felt that she was indicated for revision fixation of her proximal femur along with a removal of previous implant.  Risks and benefits were discussed with the patient and her daughter.  This included but not limited to bleeding, infection, malunion, nonunion, hardware failure, hardware irritation, nerve or blood vessel injury, DVT, even the possible anesthetic complications.  They agreed to proceed with  surgery and consent was obtained.  Operative Findings: 1.  Removal of broken SWest [place]2.  Revision cephalomedullary nailing of left subtrochanteric femur fracture using [employer] TFNA 12 x 400 mm nail with a 95 mm lag screw 3.  Prophylaxis fixation of femoral neck using Montage bone putty (hydroxyapatite)  Procedure: The patient was identified in the preoperative holding area. Consent was confirmed with the patient and their family and all questions were answered. The operative extremity was marked after confirmation with the patient. she was then brought back to the operating room by our anesthesia colleagues.  She was placed under general anesthetic and carefully transferred over to a radiolucent flat top table.  A bump was placed under her operative hip.  The left lower extremity was then prepped and draped in usual sterile fashion.  Timeout was performed to verify the patient, the procedure, and the extremity.  Preoperative antibiotics were dosed.  Fluoroscopic imaging showed the unstable nature of her injury.  I for started out by removing the distal interlocking screws through the previous 2 percutaneous incisions.  This was performed without difficulty.  I then opened up the proximal incision and I was able to remove the proximal fracture portion of the nail using the extraction bolt.  This was easily removed without difficulty.  I then opened up the lateral incision.  The lateral portion of the compression screw was then removed.  The lag screw was interlocked with the remainder of the compression screw which prevented rotation.  As result I had to use fluoroscopic imaging to guide 1/4 inch osteotome  and in between the lag screw and the compression screw to disengage the locking mechanism.  Once I had this disengaged and I was able to remove the lag screw.  I then was able to grasp and remove the compression screw using a broken screw removal set.  At this point I then focused my  attention to removing the distal portion of the nail.  The ball-tipped guidewire was then passed down the center of the canal.  A another 2.5 mm guidewire was wedged into the center of the canal to try to allow removal by backslapping the ball-tipped guidewire.  Unfortunately this did not remove the distal portion of the nail.  I tried this multiple times until I decided to place a small incision distally and used a tamp to push the distal portion of the nail out of the proximal segment.  I used a guidewire after medial parapatellar incision to find the appropriate starting point distally.  I then used a entry drill to enter the medullary canal.  I then used a footed tamp to tamp the implant out of the proximal femur.  I was then able to grasp the implant and remove it.  I then proceeded to pass a ball-tipped guidewire down the center canal from proximally and I measured the length and chose to use a 400 mm nail.  Prior to placing the nail I decided to place some Montage bone putty to provide some fixation and prophylactically fix the femoral neck defect from the lag screw combo.  I used 5 cc and placed this into the defect in the femoral neck.  I was then able to pass the nail down the center of the canal.  I then directed a threaded guidewire into the appropriate position and I was able to get purchase within the hydroxyapatite substance.  I then measured the length and chose to use a 95 mm lag screw.  I tapped the path and then placed the lag screw and got excellent fixation.  I then statically locked the proximal portion of the nail.  Using perfect circle technique I then placed 2 distal interlocking screws from lateral to medial.  The incisions were copiously irrigated and final fluoroscopic imaging was obtained.  The incision was then closed with 0 Vicryl, 2-0 Vicryl and 3-0 Monocryl and sealed with Dermabond.  Sterile dressings were applied and the patient was awoken from anesthesia and taken to the PACU  in stable condition.  Post Op Plan/Instructions: The patient will be touchdown weightbearing to left lower extremity.  She will receive postoperative Ancef.  She will be placed on Lovenox for DVT prophylaxis and discharged on a DOAC.  We will have her mobilize with physical and Occupational Therapy.  I was present and performed the entire surgery.  Patrecia Pace, PA-C did assist me throughout the case. An assistant was necessary given the difficulty in approach, maintenance of reduction and ability to instrument the fracture.   Katha Hamming, MD [facility]

## 2022-04-28 NOTE — Anesthesia Preprocedure Evaluation (Addendum)
Anesthesia Evaluation  Patient identified by MRN, date of birth, ID band Patient awake    Reviewed: Allergy & Precautions, NPO status , Patient's Chart, lab work & pertinent test results  Airway Mallampati: III  TM Distance: >3 FB Neck ROM: Limited    Dental  (+) Dental Advisory Given, Edentulous Upper, Edentulous Lower   Pulmonary sleep apnea ,    Pulmonary exam normal breath sounds clear to auscultation       Cardiovascular hypertension, Pt. on medications  Rhythm:Regular Rate:Normal     Neuro/Psych PSYCHIATRIC DISORDERS Depression  Neuromuscular disease    GI/Hepatic Neg liver ROS, GERD  ,  Endo/Other  negative endocrine ROS  Renal/GU negative Renal ROS     Musculoskeletal  (+) Arthritis ,   Abdominal   Peds  Hematology negative hematology ROS (+)   Anesthesia Other Findings   Reproductive/Obstetrics                            Anesthesia Physical Anesthesia Plan  ASA: 3  Anesthesia Plan: General   Post-op Pain Management: Ofirmev IV (intra-op)*   Induction: Intravenous  PONV Risk Score and Plan: 4 or greater and Ondansetron, Dexamethasone and Treatment may vary due to age or medical condition  Airway Management Planned: Oral ETT  Additional Equipment: None  Intra-op Plan:   Post-operative Plan: Extubation in OR  Informed Consent: I have reviewed the patients History and Physical, chart, labs and discussed the procedure including the risks, benefits and alternatives for the proposed anesthesia with the patient or authorized representative who has indicated his/her understanding and acceptance.     Dental advisory given  Plan Discussed with: CRNA  Anesthesia Plan Comments:       Anesthesia Quick Evaluation

## 2022-04-29 ENCOUNTER — Other Ambulatory Visit: Payer: Self-pay

## 2022-04-29 DIAGNOSIS — T84199A Other mechanical complication of internal fixation device of unspecified bone of limb, initial encounter: Secondary | ICD-10-CM | POA: Diagnosis not present

## 2022-04-29 LAB — COMPREHENSIVE METABOLIC PANEL
ALT: 13 U/L (ref 0–44)
AST: 26 U/L (ref 15–41)
Albumin: 3.3 g/dL — ABNORMAL LOW (ref 3.5–5.0)
Alkaline Phosphatase: 46 U/L (ref 38–126)
Anion gap: 6 (ref 5–15)
BUN: 6 mg/dL — ABNORMAL LOW (ref 8–23)
CO2: 24 mmol/L (ref 22–32)
Calcium: 9.3 mg/dL (ref 8.9–10.3)
Chloride: 108 mmol/L (ref 98–111)
Creatinine, Ser: 0.56 mg/dL (ref 0.44–1.00)
GFR, Estimated: >60 mL/min (ref >60)
Glucose, Bld: 158 mg/dL — ABNORMAL HIGH (ref 70–99)
Potassium: 4.1 mmol/L (ref 3.5–5.1)
Sodium: 138 mmol/L (ref 135–145)
Total Bilirubin: 0.6 mg/dL (ref 0.3–1.2)
Total Protein: 6 g/dL — ABNORMAL LOW (ref 6.5–8.1)

## 2022-04-29 LAB — CBC
HCT: 30.6 % — ABNORMAL LOW (ref 36.0–46.0)
Hemoglobin: 10 g/dL — ABNORMAL LOW (ref 12.0–15.0)
MCH: 33.4 pg (ref 26.0–34.0)
MCHC: 32.7 g/dL (ref 30.0–36.0)
MCV: 102.3 fL — ABNORMAL HIGH (ref 80.0–100.0)
Platelets: 173 10*3/uL (ref 150–400)
RBC: 2.99 MIL/uL — ABNORMAL LOW (ref 3.87–5.11)
RDW: 12.2 % (ref 11.5–15.5)
WBC: 6.6 10*3/uL (ref 4.0–10.5)
nRBC: 0 % (ref 0.0–0.2)

## 2022-04-29 LAB — VITAMIN D 25 HYDROXY (VIT D DEFICIENCY, FRACTURES): Vit D, 25-Hydroxy: 54.74 ng/mL (ref 30–100)

## 2022-04-29 MED ORDER — POLYETHYLENE GLYCOL 3350 17 G PO PACK
17.0000 g | PACK | Freq: Two times a day (BID) | ORAL | Status: DC
  Administered 2022-04-29 – 2022-05-02 (×7): 17 g via ORAL
  Filled 2022-04-29 (×9): qty 1

## 2022-04-29 MED ORDER — AMLODIPINE BESYLATE 5 MG PO TABS
5.0000 mg | ORAL_TABLET | Freq: Every day | ORAL | Status: DC
  Administered 2022-04-29 – 2022-05-01 (×3): 5 mg via ORAL
  Filled 2022-04-29 (×3): qty 1

## 2022-04-29 NOTE — Evaluation (Signed)
Occupational Therapy Evaluation Patient Details Name: Melanie Carrillo MRN: [id number] DOB: June 24, 1935 Today's Date: 04/29/2022   History of Present Illness 87 y/o female presented to ED on 04/27/22 due to L hip pain following feeling a pop sound in her L hip.  CT showed a fx of the intramedullary rod of LLE. S/p on 04/28/22 repair of L subtrochanteric nonunion, removal of hardware and fixation of femerol neck. Marland Kitchen PMH includes: L IM nailing(5/23), GERD, interstitial lung disease and HTN.   Clinical Impression   Pt reported prior to fx they were able to ambulate at cane level and needed assist from family with LB ADLS. Pt lives with family and able to assist 24/7. Pt was able to follow WB status completion of sit to stand and stand pivot transfer with FW to WC with min guard. Pt was educated about use of WC in home and how family can assist to enter/exit the home.  Pt currently with functional limitations due to the deficits listed below (see OT Problem List).  Pt will benefit from skilled OT to increase their safety and independence with ADL and functional mobility for ADL to facilitate discharge to venue listed below.        Recommendations for follow up therapy are one component of a multi-disciplinary discharge planning process, led by the attending physician.  Recommendations may be updated based on patient status, additional functional criteria and insurance authorization.   Follow Up Recommendations  Home health OT    Assistance Recommended at Discharge Frequent or constant Supervision/Assistance  Patient can return home with the following A little help with walking and/or transfers;A lot of help with bathing/dressing/bathroom;Assistance with cooking/housework;Direct supervision/assist for medications management;Direct supervision/assist for financial management;Assist for transportation    Functional Status Assessment  Patient has had a recent decline in their functional status and demonstrates  the ability to make significant improvements in function in a reasonable and predictable amount of time.  Equipment Recommendations  Tub/shower seat    Recommendations for Other Services       Precautions / Restrictions Restrictions Weight Bearing Restrictions: Yes LLE Weight Bearing: Touchdown weight bearing      Mobility Bed Mobility Overal bed mobility: Needs Assistance Bed Mobility: Supine to Sit     Supine to sit: Min guard, Min assist, HOB elevated          Transfers Overall transfer level: Needs assistance Equipment used: Rolling walker (2 wheels) Transfers: Sit to/from Stand Sit to Stand: Min guard                  Balance Overall balance assessment: Needs assistance Sitting-balance support: No upper extremity supported Sitting balance-Leahy Scale: Fair   Postural control: Right lateral lean Standing balance support: Bilateral upper extremity supported Standing balance-Leahy Scale: Poor                             ADL either performed or assessed with clinical judgement   ADL Overall ADL's : Needs assistance/impaired Eating/Feeding: Independent;Sitting   Grooming: Wash/dry hands;Set up;Sitting   Upper Body Bathing: Set up;Sitting   Lower Body Bathing: Cueing for safety;Cueing for sequencing;Sitting/lateral leans;Sit to/from stand;Moderate assistance;Maximal assistance   Upper Body Dressing : Set up   Lower Body Dressing: Moderate assistance;Maximal assistance;Sitting/lateral leans   Toilet Transfer: Min guard;Cueing for safety;Cueing for sequencing;BSC/3in1;Rolling walker (2 wheels)   Toileting- Clothing Manipulation and Hygiene: Total assistance;Sit to/from stand       Functional mobility  during ADLs: Min guard;Rolling walker (2 wheels);Cueing for sequencing;Cueing for safety       Vision         Perception     Praxis      Pertinent Vitals/Pain Pain Assessment Pain Assessment: Faces Faces Pain Scale: Hurts a  little bit Pain Location: LLE Pain Descriptors / Indicators: Discomfort Pain Intervention(s): Limited activity within patient's tolerance, Monitored during session, Repositioned, Premedicated before session     Hand Dominance Right   Extremity/Trunk Assessment Upper Extremity Assessment Upper Extremity Assessment: Generalized weakness   Lower Extremity Assessment Lower Extremity Assessment: Defer to PT evaluation       Communication Communication Communication: HOH   Cognition Arousal/Alertness: Awake/alert Behavior During Therapy: WFL for tasks assessed/performed Overall Cognitive Status: No family/caregiver present to determine baseline cognitive functioning                                 General Comments: noted there may be slight cognition changes but also [facility]     General Comments       Exercises     Shoulder Instructions      Home Living Family/patient expects to be discharged to:: Private residence Living Arrangements: Children Available Help at Discharge: Family;Available 24 hours/day Type of Home: House Home Access: Stairs to enter [employer] of Steps: 4 Entrance Stairs-Rails: None Home Layout: One level     Bathroom Shower/Tub: Occupational psychologist: Standard     Home Equipment: Conservation officer, nature (2 wheels);Rollator (4 wheels);Cane - single point;BSC/3in1;Wheelchair - manual   Additional Comments: Pt lives with family and able to provide 24/7 care. Pt's daughter is a Marine scientist. Pt has a lift chair.      Prior Functioning/Environment Prior Level of Function : Independent/Modified Independent             Mobility Comments: use of a cane ADLs Comments: daughter assists with LB ADLS at prior level of care        OT Problem List:        OT Treatment/Interventions: Self-care/ADL training;Therapeutic exercise;DME and/or AE instruction;Therapeutic activities;Patient/family education;Balance training    OT  Goals(Current goals can be found in the care plan section) Acute Rehab OT Goals Patient Stated Goal: to go home OT Goal Formulation: With patient Time For Goal Achievement: 05/13/22 Potential to Achieve Goals: Good ADL Goals Pt Will Perform Upper Body Bathing: with modified independence;sitting Pt Will Perform Lower Body Bathing: with min assist;sitting/lateral leans;sit to/from stand Pt Will Transfer to Toilet: with min assist Pt Will Perform Tub/Shower Transfer: with min assist;shower seat;ambulating;rolling walker  OT Frequency: Min 2X/week    Co-evaluation PT/OT/SLP Co-Evaluation/Treatment: Yes     OT goals addressed during session: ADL's and self-care      AM-PAC OT "6 Clicks" Daily Activity     Outcome Measure Help from another person eating meals?: None Help from another person taking care of personal grooming?: A Little Help from another person toileting, which includes using toliet, bedpan, or urinal?: A Lot Help from another person bathing (including washing, rinsing, drying)?: A Lot Help from another person to put on and taking off regular upper body clothing?: A Little Help from another person to put on and taking off regular lower body clothing?: A Lot 6 Click Score: 16   End of Session Equipment Utilized During Treatment: Gait belt;Rolling walker (2 wheels) Nurse Communication: Mobility status  Activity Tolerance: Patient tolerated treatment well Patient  left: in chair;with call bell/phone within reach;with chair alarm set  OT Visit Diagnosis: Unsteadiness on feet (R26.81);Other abnormalities of gait and mobility (R26.89);Repeated falls (R29.6);Muscle weakness (generalized) (M62.81);Pain Pain - Right/Left: Left Pain - part of body: Leg                Time: 6815-9470 OT Time Calculation (min): 23 min Charges:  OT General Charges $OT Visit: 1 Visit OT Evaluation $OT Eval Low Complexity: [place] OTR/L  Acute Rehab Services  [phone] office  number [phone] pager number   Joeseph Amor 04/29/2022, 11:28 AM

## 2022-04-29 NOTE — Progress Notes (Signed)
Orthopaedic Trauma Progress Note  SUBJECTIVE: Doing fairly well this morning.  About to eat breakfast.  Reports pain about operative site is well controlled. No chest pain. No SOB. No nausea/vomiting. No other complaints.  Has not been up out of bed yet since surgery.  OBJECTIVE:  Vitals:   04/28/22 1947 04/28/22 2028  BP: 97/66 125/76  Pulse: 97 (!) 108  Resp: 18 16  Temp: 98 F (36.7 C) 98.3 F (36.8 C)  SpO2: 97% 98%    General: Sitting up in bed, no acute distress Respiratory: No increased work of breathing.  Left lower extremity: Dressings clean, dry, intact.  Mildly tender about the hip but otherwise no significant tenderness to the thigh or throughout the remainder of the extremity.  Ankle DF/PF intact. + EHL/FHL.  Extremity warm and well-perfused.  Compartment soft compressible.  Neurovascularly intact  IMAGING: Stable post op imaging.   LABS:  Results for orders placed or performed during the hospital encounter of 04/27/22 (from the past 24 hour(s))  Surgical pcr screen     Status: None   Collection Time: 04/28/22 10:45 AM   Specimen: Nasal Mucosa; Nasal Swab  Result Value Ref Range   MRSA, PCR NEGATIVE NEGATIVE   Staphylococcus aureus NEGATIVE NEGATIVE  CBC     Status: Abnormal   Collection Time: 04/28/22 10:18 PM  Result Value Ref Range   WBC 8.5 4.0 - 10.5 K/uL   RBC 3.11 (L) 3.87 - 5.11 MIL/uL   Hemoglobin 10.7 (L) 12.0 - 15.0 g/dL   HCT 32.1 (L) 36.0 - 46.0 %   MCV 103.2 (H) 80.0 - 100.0 fL   MCH 34.4 (H) 26.0 - 34.0 pg   MCHC 33.3 30.0 - 36.0 g/dL   RDW 12.3 11.5 - 15.5 %   Platelets 164 150 - 400 K/uL   nRBC 0.0 0.0 - 0.2 %  Creatinine, serum     Status: None   Collection Time: 04/28/22 10:18 PM  Result Value Ref Range   Creatinine, Ser 0.59 0.44 - 1.00 mg/dL   GFR, Estimated >60 >60 mL/min  CBC     Status: Abnormal   Collection Time: 04/29/22  1:04 AM  Result Value Ref Range   WBC 6.6 4.0 - 10.5 K/uL   RBC 2.99 (L) 3.87 - 5.11 MIL/uL   Hemoglobin  10.0 (L) 12.0 - 15.0 g/dL   HCT 30.6 (L) 36.0 - 46.0 %   MCV 102.3 (H) 80.0 - 100.0 fL   MCH 33.4 26.0 - 34.0 pg   MCHC 32.7 30.0 - 36.0 g/dL   RDW 12.2 11.5 - 15.5 %   Platelets 173 150 - 400 K/uL   nRBC 0.0 0.0 - 0.2 %  Comprehensive metabolic panel     Status: Abnormal   Collection Time: 04/29/22  1:04 AM  Result Value Ref Range   Sodium 138 135 - 145 mmol/L   Potassium 4.1 3.5 - 5.1 mmol/L   Chloride 108 98 - 111 mmol/L   CO2 24 22 - 32 mmol/L   Glucose, Bld 158 (H) 70 - 99 mg/dL   BUN 6 (L) 8 - 23 mg/dL   Creatinine, Ser 0.56 0.44 - 1.00 mg/dL   Calcium 9.3 8.9 - 10.3 mg/dL   Total Protein 6.0 (L) 6.5 - 8.1 g/dL   Albumin 3.3 (L) 3.5 - 5.0 g/dL   AST 26 15 - 41 U/L   ALT 13 0 - 44 U/L   Alkaline Phosphatase 46 38 - 126 U/L  Total Bilirubin 0.6 0.3 - 1.2 mg/dL   GFR, Estimated >60 >60 mL/min   Anion gap 6 5 - 15    ASSESSMENT: Melanie Carrillo is a 87 y.o. female, 1 Day Post-Op s/p HARDWARE REMOVAL LEFT FEMUR  REVISION FIXATION OF LEFT FEMUR FRACTURE  CV/Blood loss: Acute blood loss anemia, Hgb 10.0 this morning. Hemodynamically stable  PLAN: Weightbearing: TDWB LLE ROM: Okay for unrestricted hip and knee motion as tolerated Incisional and dressing care: Reinforce dressings as needed.  Plan to remove dressings 04/30/2022 Showering: Okay to begin showering and getting incisions wet 05/01/2022 Orthopedic device(s): None  Pain management:  1. Tylenol 1000 mg q 6 hours scheduled 2. Oxycodone 5 mg q 4 hours PRN 3. Morphine 4 mg q 2 hours PRN VTE prophylaxis: Lovenox, SCDs ID:  Ancef 2gm post op Foley/Lines:  No foley, KVO IVFs Impediments to Fracture Healing: Vitamin D level pending, will start supplementation as indicated Dispo: PT/OT evaluation today, dispo pending.  Plan to remove dressings tomorrow.  Follow-up on vitamin D level.    D/C recommendations: -Oxycodone 5 mg for pain control -Eliquis 2.5 mg twice daily x30 days for DVT prophylaxis -Possible need for Vit  D supplementation  Follow - up plan: 2 weeks   Contact information:  Katha Hamming MD, Rushie Nyhan PA-C. After hours and holidays please check [email or web address] for group call information for [facility]   Gwinda Passe, PA-C [phone] (office) [email or web address]

## 2022-04-29 NOTE — Evaluation (Signed)
Physical Therapy Evaluation Patient Details Name: Melanie Carrillo MRN: [id number] DOB: 1935/02/22 Today's Date: 04/29/2022  History of Present Illness  87 y/o female presented to ED on 04/27/22 due to L hip pain following feeling a pop sound in her L hip.  CT showed a fx of the intramedullary rod of LLE. S/p on 04/28/22 repair of L subtrochanteric nonunion, removal of hardware and fixation of femerol neck. Marland Kitchen PMH includes: L IM nailing(5/23), GERD, interstitial lung disease and HTN.  Clinical Impression  Patient admitted with the above. PTA, patient lives with daughter and son in law who are able to assist 24/7. Patient presents with weakness, impaired balance, and decreased activity tolerance. Educated patient on WB restrictions and use of w/c for stair negotiation up backwards with help of son in law, patient verbalized understanding. Patient required min guard for sit to stand and stand pivot transfer to recliner with RW. Patient will benefit from skilled PT services during acute stay to address listed deficits. Recommend HHPT at discharge to maximize functional mobility and safety.       Recommendations for follow up therapy are one component of a multi-disciplinary discharge planning process, led by the attending physician.  Recommendations may be updated based on patient status, additional functional criteria and insurance authorization.  Follow Up Recommendations Home health PT      Assistance Recommended at Discharge Frequent or constant Supervision/Assistance  Patient can return home with the following  A little help with walking and/or transfers;A little help with bathing/dressing/bathroom;Assistance with cooking/housework;Assist for transportation;Help with stairs or ramp for entrance    Equipment Recommendations None recommended by PT  Recommendations for Other Services       Functional Status Assessment Patient has had a recent decline in their functional status and demonstrates the  ability to make significant improvements in function in a reasonable and predictable amount of time.     Precautions / Restrictions Precautions Precautions: Fall Restrictions Weight Bearing Restrictions: Yes LLE Weight Bearing: Touchdown weight bearing      Mobility  Bed Mobility Overal bed mobility: Needs Assistance Bed Mobility: Supine to Sit     Supine to sit: Min guard, Min assist, HOB elevated          Transfers Overall transfer level: Needs assistance Equipment used: Rolling Crystale Giannattasio (2 wheels) Transfers: Sit to/from Stand, Bed to chair/wheelchair/BSC Sit to Stand: Min guard Stand pivot transfers: Min guard         General transfer comment: able to perform stand pivot transfer with RW and min guard for safety. Good adherence to TWB on L    Ambulation/Gait                  Stairs Stairs:  (Discussed with patient about using w/c and for son in law to bump patient up backwards on the stairs)          Wheelchair Mobility    Modified Rankin (Stroke Patients Only)       Balance Overall balance assessment: Needs assistance Sitting-balance support: No upper extremity supported Sitting balance-Leahy Scale: Fair   Postural control: Right lateral lean Standing balance support: Bilateral upper extremity supported Standing balance-Leahy Scale: Poor                               Pertinent Vitals/Pain Pain Assessment Pain Assessment: Faces Faces Pain Scale: Hurts a little bit Pain Location: LLE Pain Descriptors / Indicators: Discomfort  Home Living Family/patient expects to be discharged to:: Private residence Living Arrangements: Children Available Help at Discharge: Family;Available 24 hours/day Type of Home: House Home Access: Stairs to enter Entrance Stairs-Rails: None Entrance Stairs-Number of Steps: 4   Home Layout: One level Home Equipment: Conservation officer, nature (2 wheels);Rollator (4 wheels);Cane - single  point;BSC/3in1;Wheelchair - manual Additional Comments: Pt lives with family and able to provide 24/7 care. Pt's daughter is a Marine scientist. Pt has a lift chair.    Prior Function Prior Level of Function : Independent/Modified Independent             Mobility Comments: use of a cane ADLs Comments: daughter assists with LB ADLS at prior level of care     Hand Dominance   Dominant Hand: Right    Extremity/Trunk Assessment   Upper Extremity Assessment Upper Extremity Assessment: Defer to OT evaluation    Lower Extremity Assessment Lower Extremity Assessment: LLE deficits/detail LLE Deficits / Details: post op pain and weakness    Cervical / Trunk Assessment Cervical / Trunk Assessment: Kyphotic  Communication   Communication: HOH  Cognition Arousal/Alertness: Awake/alert Behavior During Therapy: WFL for tasks assessed/performed Overall Cognitive Status: No family/caregiver present to determine baseline cognitive functioning                                 General Comments: noted there may be slight cognition changes but also HOH        General Comments General comments (skin integrity, edema, etc.): VSS on RA    Exercises     Assessment/Plan    PT Assessment Patient needs continued PT services  PT Problem List Decreased strength;Decreased balance;Decreased activity tolerance;Decreased mobility       PT Treatment Interventions DME instruction;Gait training;Functional mobility training;Therapeutic activities;Therapeutic exercise;Balance training;Patient/family education    PT Goals (Current goals can be found in the Care Plan section)  Acute Rehab PT Goals Patient Stated Goal: to go home PT Goal Formulation: With patient Time For Goal Achievement: 05/13/22 Potential to Achieve Goals: Good    Frequency Min 5X/week     Co-evaluation PT/OT/SLP Co-Evaluation/Treatment: Yes Reason for Co-Treatment: For patient/therapist safety;To address  functional/ADL transfers PT goals addressed during session: Balance;Mobility/safety with mobility         AM-PAC PT "6 Clicks" Mobility  Outcome Measure Help needed turning from your back to your side while in a flat bed without using bedrails?: A Little Help needed moving from lying on your back to sitting on the side of a flat bed without using bedrails?: A Little Help needed moving to and from a bed to a chair (including a wheelchair)?: A Little Help needed standing up from a chair using your arms (e.g., wheelchair or bedside chair)?: A Little Help needed to walk in hospital room?: A Little Help needed climbing 3-5 steps with a railing? : A Lot 6 Click Score: 17    End of Session Equipment Utilized During Treatment: Gait belt Activity Tolerance: Patient tolerated treatment well Patient left: in chair;with call bell/phone within reach;with chair alarm set Nurse Communication: Mobility status PT Visit Diagnosis: Unsteadiness on feet (R26.81);Muscle weakness (generalized) (M62.81);Difficulty in walking, not elsewhere classified (R26.2)    Time: 8469-6295 PT Time Calculation (min) (ACUTE ONLY): 20 min   Charges:   PT Evaluation $PT Eval Low Complexity: 1 Low          Brighton Pilley A. Gilford Rile PT, DPT Acute Rehabilitation Services Office [phone]  Elham Fini A Dyneisha Murchison 04/29/2022, 1:39 PM

## 2022-04-29 NOTE — Progress Notes (Signed)
PROGRESS NOTE    Melanie Carrillo  JHE:[id number] DOB: 1935-01-14 DOA: 04/27/2022 PCP: Gayland Curry, MD   Brief Narrative: 87 year old female with ORIF and intramedullary rod in May 2023 for intertrochanteric fracture readmitted with fracture of the intramedullary rod at the level of the interlocking screw.  Ortho consulted.  Assessment & Plan:   Principal Problem:   Loosening of intramedullary nail (HCC) Active Problems:   Interstitial lung disease ([place])   Essential hypertension   GERD (gastroesophageal reflux disease)   Hyperlipidemia, mixed   Obstructive sleep apnea   #1 fracture of the left intramedullary rod-status post hardware removal and revision fixation of left femur fracture.  Patient reports some pain at the surgical site otherwise she feels better.  Her vitamin D level is normal. On oxycodone for pain control Lovenox for DVT prophylaxis Continue bowel regime she has not had a BM  #2 history of essential hypertension restart Norvasc at 5 mg daily.  Hold Cozaar.  #3 interstitial lung disease stable on room air-encourage incentive spirometer  #4 obstructive sleep apnea stable  #5 glaucoma restart home eyedrops  #6 hyperlipidemia on statins  #7 GERD on Protonix    Estimated body mass index is 28 kg/m as calculated from the following:   Height as of this encounter: '5\' 4"'$  (1.626 m).   Weight as of this encounter: 74 kg.  DVT prophylaxis: Lovenox Code Status: Full code Family Communication: Discussed with daughter at bedside Disposition Plan:  Status is: Inpatient Remains inpatient appropriate because: Patient with left hip fracture going to the OR today   Consultants:  Ortho Procedures none antimicrobials: None  Subjective: Resting in bed  Feels better than yesterday   objective: Vitals:   04/29/22 0802 04/29/22 1300 04/29/22 1305 04/29/22 1509  BP: 137/68   (!) 141/60  Pulse: 90   83  Resp:      Temp: 98 F (36.7 C)   97.6 F (36.4 C)   TempSrc: Oral   Oral  SpO2: 97% (!) 87% 97% 97%  Weight:      Height:        Intake/Output Summary (Last 24 hours) at 04/29/2022 1551 Last data filed at 04/29/2022 1500 Gross per 24 hour  Intake 1930.58 ml  Output 2700 ml  Net -769.42 ml    Filed Weights   04/27/22 1357  Weight: 74 kg    Examination:  General exam: Appears in no acute distress  respiratory system: Clear to auscultation. Respiratory effort normal. Cardiovascular system: S1 & S2 heard, RRR. No JVD, murmurs, rubs, gallops or clicks. No pedal edema. Gastrointestinal system: Abdomen is nondistended, soft and nontender. No organomegaly or masses felt. Normal bowel sounds heard. Central nervous system: Alert and oriented. No focal neurological deficits. Extremities: No edema.  Left hip incision covered with dressing clean dry intact Skin: No rashes, lesions or ulcers Psychiatry: Judgement and insight appear normal. Mood & affect appropriate.     Data Reviewed: I have personally reviewed following labs and imaging studies  CBC: Recent Labs  Lab 04/27/22 2012 04/28/22 2218 04/29/22 0104  WBC 6.8 8.5 6.6  HGB 13.6 10.7* 10.0*  HCT 42.1 32.1* 30.6*  MCV 103.2* 103.2* 102.3*  PLT 225 164 185    Basic Metabolic Panel: Recent Labs  Lab 04/27/22 2012 04/28/22 2218 04/29/22 0104  NA 138  --  138  K 4.0  --  4.1  CL 107  --  108  CO2 24  --  24  GLUCOSE 119*  --  158*  BUN 10  --  6*  CREATININE 0.65 0.59 0.56  CALCIUM 10.4*  --  9.3    GFR: Estimated Creatinine Clearance: 50.6 mL/min (by C-G formula based on SCr of 0.56 mg/dL). Liver Function Tests: Recent Labs  Lab 04/29/22 0104  AST 26  ALT 13  ALKPHOS 46  BILITOT 0.6  PROT 6.0*  ALBUMIN 3.3*   No results for input(s): "LIPASE", "AMYLASE" in the last 168 hours. No results for input(s): "AMMONIA" in the last 168 hours. Coagulation Profile: No results for input(s): "INR", "PROTIME" in the last 168 hours. Cardiac Enzymes: No results for  input(s): "CKTOTAL", "CKMB", "CKMBINDEX", "TROPONINI" in the last 168 hours. BNP (last 3 results) No results for input(s): "PROBNP" in the last 8760 hours. HbA1C: No results for input(s): "HGBA1C" in the last 72 hours. CBG: No results for input(s): "GLUCAP" in the last 168 hours. Lipid Profile: No results for input(s): "CHOL", "HDL", "LDLCALC", "TRIG", "CHOLHDL", "LDLDIRECT" in the last 72 hours. Thyroid Function Tests: No results for input(s): "TSH", "T4TOTAL", "FREET4", "T3FREE", "THYROIDAB" in the last 72 hours. Anemia Panel: No results for input(s): "VITAMINB12", "FOLATE", "FERRITIN", "TIBC", "IRON", "RETICCTPCT" in the last 72 hours. Sepsis Labs: No results for input(s): "PROCALCITON", "LATICACIDVEN" in the last 168 hours.  Recent Results (from the past 240 hour(s))  Surgical pcr screen     Status: None   Collection Time: 04/28/22 10:45 AM   Specimen: Nasal Mucosa; Nasal Swab  Result Value Ref Range Status   MRSA, PCR NEGATIVE NEGATIVE Final   Staphylococcus aureus NEGATIVE NEGATIVE Final    Comment: (NOTE) The Xpert SA Assay (FDA approved for NASAL specimens in patients 21 years of age and older), is one component of a comprehensive surveillance program. It is not intended to diagnose infection nor to guide or monitor treatment. Performed at [facility], [address] [place], [place] [address]          Radiology Studies: DG FEMUR PORT MIN 2 VIEWS LEFT  Result Date: 04/28/2022 CLINICAL DATA:  Left femoral fracture, ORIF EXAM: LEFT FEMUR PORTABLE 2 VIEWS COMPARISON:  None Available. FINDINGS: ORIF of a left subtrochanteric hip fracture is seen utilizing a long-stem intramedullary rod with proximal and distal interlocking screws. Normal alignment. No interval fracture. No dislocation. There is intra-articular gas and fluid within the left knee predominantly within the suprapatellar recess, as well as surrounding the left hip likely postsurgical in nature.  IMPRESSION: Status post ORIF left subtrochanteric hip fracture. Normal alignment. Electronically Signed   By: Fidela Salisbury M.D.   On: 04/28/2022 19:44   DG FEMUR MIN 2 VIEWS LEFT  Result Date: 04/28/2022 CLINICAL DATA:  Hardware revision EXAM: LEFT FEMUR 2 VIEWS COMPARISON:  12/26/2021, CT 04/27/2022 FINDINGS: Seventeen low resolution intraoperative spot views of the left femur. Total fluoroscopy time was 8 minutes 3 seconds, fluoroscopic dose of 71.32 mGy. Images were obtained during intraoperative removal of previous fixation hardware with subsequent placement of new intramedullary rod and fixating screws across subtrochanteric fracture. IMPRESSION: Intraoperative fluoroscopic assistance provided during hardware revision left femur Electronically Signed   By: Donavan Foil M.D.   On: 04/28/2022 19:09   DG C-Arm 1-60 Min-No Report  Result Date: 04/28/2022 Fluoroscopy was utilized by the requesting physician.  No radiographic interpretation.   DG C-Arm 1-60 Min-No Report  Result Date: 04/28/2022 Fluoroscopy was utilized by the requesting physician.  No radiographic interpretation.   DG C-Arm 1-60 Min-No Report  Result Date: 04/28/2022 Fluoroscopy was utilized by  the requesting physician.  No radiographic interpretation.        Scheduled Meds:  acetaminophen  1,000 mg Oral Q6H   brimonidine  1 drop Both Eyes BID   docusate sodium  100 mg Oral BID   enoxaparin (LOVENOX) injection  40 mg Subcutaneous Q24H   latanoprost  1 drop Both Eyes QHS   montelukast  10 mg Oral QHS   pantoprazole  40 mg Oral Daily   polyethylene glycol  17 g Oral BID   pravastatin  40 mg Oral q1800   sertraline  25 mg Oral Daily   Continuous Infusions:  sodium chloride Stopped (04/29/22 0606)     LOS: 2 days    Time spent: 35 minutes    Georgette Shell, MD 04/29/2022, 3:51 PM

## 2022-04-30 ENCOUNTER — Other Ambulatory Visit (HOSPITAL_COMMUNITY): Payer: Self-pay

## 2022-04-30 DIAGNOSIS — T84199A Other mechanical complication of internal fixation device of unspecified bone of limb, initial encounter: Secondary | ICD-10-CM | POA: Diagnosis not present

## 2022-04-30 LAB — BASIC METABOLIC PANEL
Anion gap: 9 (ref 5–15)
BUN: 11 mg/dL (ref 8–23)
CO2: 25 mmol/L (ref 22–32)
Calcium: 9.4 mg/dL (ref 8.9–10.3)
Chloride: 104 mmol/L (ref 98–111)
Creatinine, Ser: 0.61 mg/dL (ref 0.44–1.00)
GFR, Estimated: >60 mL/min (ref >60)
Glucose, Bld: 116 mg/dL — ABNORMAL HIGH (ref 70–99)
Potassium: 4.1 mmol/L (ref 3.5–5.1)
Sodium: 138 mmol/L (ref 135–145)

## 2022-04-30 LAB — CBC
HCT: 26.4 % — ABNORMAL LOW (ref 36.0–46.0)
Hemoglobin: 8.9 g/dL — ABNORMAL LOW (ref 12.0–15.0)
MCH: 34.5 pg — ABNORMAL HIGH (ref 26.0–34.0)
MCHC: 33.7 g/dL (ref 30.0–36.0)
MCV: 102.3 fL — ABNORMAL HIGH (ref 80.0–100.0)
Platelets: 161 10*3/uL (ref 150–400)
RBC: 2.58 MIL/uL — ABNORMAL LOW (ref 3.87–5.11)
RDW: 12.4 % (ref 11.5–15.5)
WBC: 7.1 10*3/uL (ref 4.0–10.5)
nRBC: 0 % (ref 0.0–0.2)

## 2022-04-30 MED ORDER — APIXABAN 2.5 MG PO TABS
2.5000 mg | ORAL_TABLET | Freq: Two times a day (BID) | ORAL | 0 refills | Status: DC
  Filled 2022-04-30: qty 60, 30d supply, fill #0

## 2022-04-30 MED ORDER — OXYCODONE HCL 5 MG PO TABS
5.0000 mg | ORAL_TABLET | Freq: Four times a day (QID) | ORAL | 0 refills | Status: DC | PRN
  Filled 2022-04-30: qty 20, 5d supply, fill #0

## 2022-04-30 NOTE — Progress Notes (Signed)
Orthopaedic Trauma Progress Note  SUBJECTIVE: Doing fairly well this morning.  Notes increased pain in the thigh today compared to yesterday, but medications are helping.  No chest pain. No SOB. No nausea/vomiting. No other complaints.  Tolerating diet and fluids.  No BM since admission.  OBJECTIVE:  Vitals:   04/30/22 0757 04/30/22 0812  BP: (!) 141/58 (!) 141/58  Pulse: 100   Resp: 19   Temp: 98.1 F (36.7 C)   SpO2: 96%     General: Sitting up in bed, no acute distress Respiratory: No increased work of breathing.  Left lower extremity: Dressings removed, incisions clean, dry, intact.  Mildly tender about the hip and throughout the thigh but otherwise no significant tenderness throughout the remainder of the extremity.  Ankle DF/PF intact. + EHL/FHL.  Extremity warm and well-perfused.  Compartment soft compressible.  Neurovascularly intact  IMAGING: Stable post op imaging.   LABS:  Results for orders placed or performed during the hospital encounter of 04/27/22 (from the past 24 hour(s))  Basic metabolic panel     Status: Abnormal   Collection Time: 04/30/22  1:02 AM  Result Value Ref Range   Sodium 138 135 - 145 mmol/L   Potassium 4.1 3.5 - 5.1 mmol/L   Chloride 104 98 - 111 mmol/L   CO2 25 22 - 32 mmol/L   Glucose, Bld 116 (H) 70 - 99 mg/dL   BUN 11 8 - 23 mg/dL   Creatinine, Ser 0.61 0.44 - 1.00 mg/dL   Calcium 9.4 8.9 - 10.3 mg/dL   GFR, Estimated >60 >60 mL/min   Anion gap 9 5 - 15  CBC     Status: Abnormal   Collection Time: 04/30/22  1:02 AM  Result Value Ref Range   WBC 7.1 4.0 - 10.5 K/uL   RBC 2.58 (L) 3.87 - 5.11 MIL/uL   Hemoglobin 8.9 (L) 12.0 - 15.0 g/dL   HCT 26.4 (L) 36.0 - 46.0 %   MCV 102.3 (H) 80.0 - 100.0 fL   MCH 34.5 (H) 26.0 - 34.0 pg   MCHC 33.7 30.0 - 36.0 g/dL   RDW 12.4 11.5 - 15.5 %   Platelets 161 150 - 400 K/uL   nRBC 0.0 0.0 - 0.2 %    ASSESSMENT: Melanie Carrillo is a 87 y.o. female, 2 Days Post-Op s/p HARDWARE REMOVAL LEFT FEMUR   REVISION FIXATION OF LEFT FEMUR FRACTURE  CV/Blood loss: Acute blood loss anemia, Hgb 8.9 this morning. Hemodynamically stable  PLAN: Weightbearing: TDWB LLE ROM: Okay for unrestricted hip and knee motion as tolerated Incisional and dressing care: Okay to leave incisions open to the air Showering: Okay to begin showering and getting incisions wet 05/01/2022 Orthopedic device(s): None  Pain management:  1. Tylenol 1000 mg q 6 hours scheduled 2. Oxycodone 5 mg q 4 hours PRN 3. Morphine 4 mg q 2 hours PRN VTE prophylaxis: Lovenox, SCDs ID:  Ancef 2gm post op completed Foley/Lines:  No foley, KVO IVFs Impediments to Fracture Healing: Vitamin D level 54, no supplementation needed  Dispo: Therapies as tolerated, PT/OT recommending home health.  Patient stable for discharge from ortho standpoint once cleared by medicine team and therapies.  D/C recommendations: -Oxycodone 5 mg for pain control -Eliquis 2.5 mg twice daily x30 days for DVT prophylaxis -No need for Vit D supplementation  Follow - up plan: 2 weeks   Contact information:  Katha Hamming MD, Rushie Nyhan PA-C. After hours and holidays please check [email or web address] for group call  information for [facility]   Gwinda Passe, PA-C [phone] (office) [email or web address]

## 2022-04-30 NOTE — Progress Notes (Signed)
PT Treatment & Family Education   04/30/22 1053  PT Visit Information  Last PT Received On 04/30/22  Assistance Needed +1  History of Present Illness 87 y/o female presented to ED on 04/27/22 due to L hip pain following feeling a pop sound in her L hip.  CT showed a fx of the intramedullary rod of LLE. S/p on 04/28/22 repair of L subtrochanteric nonunion, removal of hardware and fixation of femerol neck. Marland Kitchen PMH includes: L IM nailing(5/23), GERD, interstitial lung disease and HTN.  General Comments  General comments (skin integrity, edema, etc.) Extensive time spent on phone call with daughter about home setup, patient's mobility, and equipment. Discussed options of HHPT vs SNF and benefits of both. Daughter ultimately requesting SNF prior to returning home due to patient needing to be at independent level as she may be home alone at times   PT - Assessment/Plan  PT Plan Discharge plan needs to be updated;Frequency needs to be updated  PT Visit Diagnosis Unsteadiness on feet (R26.81);Muscle weakness (generalized) (M62.81);Difficulty in walking, not elsewhere classified (R26.2)  PT Frequency (ACUTE ONLY) Min 3X/week  Follow Up Recommendations Skilled nursing-short term rehab (<3 hours/day)  Can patient physically be transported by private vehicle No  Assistance recommended at discharge Frequent or constant Supervision/Assistance  Patient can return home with the following A little help with walking and/or transfers;A little help with bathing/dressing/bathroom;Assistance with cooking/housework;Assist for transportation;Help with stairs or ramp for entrance  PT equipment None recommended by PT  Acute Rehab PT Goals  PT Goal Formulation With patient  Time For Goal Achievement 05/13/22  Potential to Achieve Goals Good  PT Time Calculation  PT Start Time (ACUTE ONLY) 1039  PT Stop Time (ACUTE ONLY) 1052  PT Time Calculation (min) (ACUTE ONLY) 13 min  PT General Charges  $$ ACUTE PT VISIT 1 Visit    Sanora Cunanan A. Gilford Rile PT, DPT Acute Rehabilitation Services Office [phone]

## 2022-04-30 NOTE — Progress Notes (Signed)
PROGRESS NOTE    Melanie Carrillo  GBE:[id number] DOB: [birth year] DOA: 04/27/2022 PCP: Gayland Curry, MD   Brief Narrative: 87 year old female with ORIF and intramedullary rod in May 2023 for intertrochanteric fracture readmitted with fracture of the intramedullary rod at the level of the interlocking screw.  Ortho consulted.  Assessment & Plan:   Principal Problem:   Loosening of intramedullary nail (HCC) Active Problems:   Interstitial lung disease ([place])   Essential hypertension   GERD (gastroesophageal reflux disease)   Hyperlipidemia, mixed   Obstructive sleep apnea   #1 fracture of the left intramedullary rod-status post hardware removal and revision fixation of left femur fracture.  Patient reports some pain at the surgical site otherwise she feels better.  Her vitamin D level is normal. On oxycodone for pain control Lovenox for DVT prophylaxis Continue bowel regime  #2 history of essential hypertension blood pressure 141/58 continue Norvasc 5 mg daily.  Cozaar still on hold.    #3 interstitial lung disease stable on room air-encourage incentive spirometer  #4 obstructive sleep apnea stable  #5 glaucoma continue eyedrops  #6 hyperlipidemia on statins  #7 GERD on Protonix    Estimated body mass index is 28 kg/m as calculated from the following:   Height as of this encounter: '5\' 4"'$  (1.626 m).   Weight as of this encounter: 74 kg.  DVT prophylaxis: Lovenox Code Status: Full code Family Communication: Discussed with daughter at bedside Disposition Plan:  Status is: Inpatient Remains inpatient appropriate because: Await safe discharge Consultants:  Ortho Procedures left hip removal of hardware and revision fixation of left femur fracture antimicrobials: None  Subjective:  She is sitting up with her feet up in the recliner feeling better than yesterday she is very concerned about how she is going to climb the 3 steps to get into her house she reports she will  have to put a ramp so she can get in and out of the house  objective: Vitals:   04/29/22 1509 04/29/22 2214 04/30/22 0757 04/30/22 0812  BP: (!) 141/60 132/68 (!) 141/58 (!) 141/58  Pulse: 83 83 100   Resp:  18 19   Temp: 97.6 F (36.4 C) 98.3 F (36.8 C) 98.1 F (36.7 C)   TempSrc: Oral Oral Oral   SpO2: 97% 98% 96%   Weight:      Height:        Intake/Output Summary (Last 24 hours) at 04/30/2022 1336 Last data filed at 04/30/2022 7588 Gross per 24 hour  Intake 340 ml  Output 1300 ml  Net -960 ml    Filed Weights   04/27/22 1357  Weight: 74 kg    Examination:  General exam: Appears in no acute distress  respiratory system: Clear to auscultation. Respiratory effort normal. Cardiovascular system: S1 & S2 heard, RRR. No JVD, murmurs, rubs, gallops or clicks. No pedal edema. Gastrointestinal system: Abdomen is nondistended, soft and nontender. No organomegaly or masses felt. Normal bowel sounds heard. Central nervous system: Alert and oriented. No focal neurological deficits. Extremities: No edema.  Left hip incision covered with dressing clean dry intact Skin: No rashes, lesions or ulcers Psychiatry: Judgement and insight appear normal. Mood & affect appropriate.     Data Reviewed: I have personally reviewed following labs and imaging studies  CBC: Recent Labs  Lab 04/27/22 2012 04/28/22 2218 04/29/22 0104 04/30/22 0102  WBC 6.8 8.5 6.6 7.1  HGB 13.6 10.7* 10.0* 8.9*  HCT 42.1 32.1* 30.6* 26.4*  MCV 103.2* 103.2*  102.3* 102.3*  PLT 225 164 173 814    Basic Metabolic Panel: Recent Labs  Lab 04/27/22 2012 04/28/22 2218 04/29/22 0104 04/30/22 0102  NA 138  --  138 138  K 4.0  --  4.1 4.1  CL 107  --  108 104  CO2 24  --  24 25  GLUCOSE 119*  --  158* 116*  BUN 10  --  6* 11  CREATININE 0.65 0.59 0.56 0.61  CALCIUM 10.4*  --  9.3 9.4    GFR: Estimated Creatinine Clearance: 50.6 mL/min (by C-G formula based on SCr of 0.61 mg/dL). Liver Function  Tests: Recent Labs  Lab 04/29/22 0104  AST 26  ALT 13  ALKPHOS 46  BILITOT 0.6  PROT 6.0*  ALBUMIN 3.3*    No results for input(s): "LIPASE", "AMYLASE" in the last 168 hours. No results for input(s): "AMMONIA" in the last 168 hours. Coagulation Profile: No results for input(s): "INR", "PROTIME" in the last 168 hours. Cardiac Enzymes: No results for input(s): "CKTOTAL", "CKMB", "CKMBINDEX", "TROPONINI" in the last 168 hours. BNP (last 3 results) No results for input(s): "PROBNP" in the last 8760 hours. HbA1C: No results for input(s): "HGBA1C" in the last 72 hours. CBG: No results for input(s): "GLUCAP" in the last 168 hours. Lipid Profile: No results for input(s): "CHOL", "HDL", "LDLCALC", "TRIG", "CHOLHDL", "LDLDIRECT" in the last 72 hours. Thyroid Function Tests: No results for input(s): "TSH", "T4TOTAL", "FREET4", "T3FREE", "THYROIDAB" in the last 72 hours. Anemia Panel: No results for input(s): "VITAMINB12", "FOLATE", "FERRITIN", "TIBC", "IRON", "RETICCTPCT" in the last 72 hours. Sepsis Labs: No results for input(s): "PROCALCITON", "LATICACIDVEN" in the last 168 hours.  Recent Results (from the past 240 hour(s))  Surgical pcr screen     Status: None   Collection Time: 04/28/22 10:45 AM   Specimen: Nasal Mucosa; Nasal Swab  Result Value Ref Range Status   MRSA, PCR NEGATIVE NEGATIVE Final   Staphylococcus aureus NEGATIVE NEGATIVE Final    Comment: (NOTE) The Xpert SA Assay (FDA approved for NASAL specimens in patients 28 years of age and older), is one component of a comprehensive surveillance program. It is not intended to diagnose infection nor to guide or monitor treatment. Performed at [facility], [place] [address] [place], [place] [address]          Radiology Studies: DG FEMUR PORT MIN 2 VIEWS LEFT  Result Date: 04/28/2022 CLINICAL DATA:  Left femoral fracture, ORIF EXAM: LEFT FEMUR PORTABLE 2 VIEWS COMPARISON:  None Available. FINDINGS: ORIF of a  left subtrochanteric hip fracture is seen utilizing a long-stem intramedullary rod with proximal and distal interlocking screws. Normal alignment. No interval fracture. No dislocation. There is intra-articular gas and fluid within the left knee predominantly within the suprapatellar recess, as well as surrounding the left hip likely postsurgical in nature. IMPRESSION: Status post ORIF left subtrochanteric hip fracture. Normal alignment. Electronically Signed   By: Fidela Salisbury M.D.   On: 04/28/2022 19:44   DG FEMUR MIN 2 VIEWS LEFT  Result Date: 04/28/2022 CLINICAL DATA:  Hardware revision EXAM: LEFT FEMUR 2 VIEWS COMPARISON:  12/26/2021, CT 04/27/2022 FINDINGS: Seventeen low resolution intraoperative spot views of the left femur. Total fluoroscopy time was 8 minutes 3 seconds, fluoroscopic dose of 71.32 mGy. Images were obtained during intraoperative removal of previous fixation hardware with subsequent placement of new intramedullary rod and fixating screws across subtrochanteric fracture. IMPRESSION: Intraoperative fluoroscopic assistance provided during hardware revision left femur Electronically Signed  By: Donavan Foil M.D.   On: 04/28/2022 19:09   DG C-Arm 1-60 Min-No Report  Result Date: 04/28/2022 Fluoroscopy was utilized by the requesting physician.  No radiographic interpretation.   DG C-Arm 1-60 Min-No Report  Result Date: 04/28/2022 Fluoroscopy was utilized by the requesting physician.  No radiographic interpretation.   DG C-Arm 1-60 Min-No Report  Result Date: 04/28/2022 Fluoroscopy was utilized by the requesting physician.  No radiographic interpretation.        Scheduled Meds:  acetaminophen  1,000 mg Oral Q6H   amLODipine  5 mg Oral Daily   brimonidine  1 drop Both Eyes BID   docusate sodium  100 mg Oral BID   enoxaparin (LOVENOX) injection  40 mg Subcutaneous Q24H   latanoprost  1 drop Both Eyes QHS   montelukast  10 mg Oral QHS   pantoprazole  40 mg Oral Daily    polyethylene glycol  17 g Oral BID   pravastatin  40 mg Oral q1800   sertraline  25 mg Oral Daily   Continuous Infusions:  sodium chloride Stopped (04/29/22 0606)     LOS: 3 days    Time spent: 35 minutes    Georgette Shell, MD 04/30/2022, 1:36 PM

## 2022-04-30 NOTE — Progress Notes (Signed)
Physical Therapy Treatment Patient Details Name: Melanie Carrillo MRN: [id number] DOB: 10/15/1934 Today's Date: 04/30/2022   History of Present Illness 87 y/o female presented to ED on 04/27/22 due to L hip pain following feeling a pop sound in her L hip.  CT showed a fx of the intramedullary rod of LLE. S/p on 04/28/22 repair of L subtrochanteric nonunion, removal of hardware and fixation of femerol neck. Marland Kitchen PMH includes: L IM nailing(5/23), GERD, interstitial lung disease and HTN.    PT Comments    Patient complaining of increased pain compared to previous session but agreeable to participate. Re-educated patient on WB restrictions and timeline per MD notes, patient verbalized understanding but at end of session reports daughter stated "you better be able to put some weight on that leg before you come home". Reinforced education about WB restrictions. Patient required minA for squat pivot transfer with no AD and good adherence to TWB on L during transfer. Performed exercises in sitting and long sitting in recliner for L LE strengthening. D/c plan remains appropriate.    Recommendations for follow up therapy are one component of a multi-disciplinary discharge planning process, led by the attending physician.  Recommendations may be updated based on patient status, additional functional criteria and insurance authorization.  Follow Up Recommendations  Home health PT     Assistance Recommended at Discharge Frequent or constant Supervision/Assistance  Patient can return home with the following A little help with walking and/or transfers;A little help with bathing/dressing/bathroom;Assistance with cooking/housework;Assist for transportation;Help with stairs or ramp for entrance   Equipment Recommendations  None recommended by PT    Recommendations for Other Services       Precautions / Restrictions Precautions Precautions: Fall Restrictions Weight Bearing Restrictions: Yes LLE Weight Bearing:  Touchdown weight bearing     Mobility  Bed Mobility Overal bed mobility: Needs Assistance Bed Mobility: Supine to Sit     Supine to sit: Supervision     General bed mobility comments: supervision for safety. No assistance required    Transfers Overall transfer level: Needs assistance Equipment used: None Transfers: Bed to chair/wheelchair/BSC       Squat pivot transfers: Min assist     General transfer comment: performed squat pivot transfer with no AD and required minA for balance. Able to maintain TWB on L    Ambulation/Gait                   Stairs             Wheelchair Mobility    Modified Rankin (Stroke Patients Only)       Balance Overall balance assessment: Needs assistance Sitting-balance support: No upper extremity supported Sitting balance-Leahy Scale: Good     Standing balance support: No upper extremity supported, During functional activity Standing balance-Leahy Scale: Fair Standing balance comment: minA for balance during squat pivot transfer                            Cognition Arousal/Alertness: Awake/alert Behavior During Therapy: WFL for tasks assessed/performed Overall Cognitive Status: No family/caregiver present to determine baseline cognitive functioning                                          Exercises General Exercises - Lower Extremity Long Arc Quad: Left, 5 reps, Seated Heel Slides: AAROM, Left,  5 reps (long sitting) Straight Leg Raises: AAROM, Left, 5 reps (long sitting) Hip Flexion/Marching: Left, 5 reps, Seated    General Comments General comments (skin integrity, edema, etc.): VSS on RA      Pertinent Vitals/Pain Pain Assessment Pain Assessment: Faces Faces Pain Scale: Hurts little more Pain Location: LLE Pain Descriptors / Indicators: Discomfort Pain Intervention(s): Monitored during session    Home Living                          Prior Function             PT Goals (current goals can now be found in the care plan section) Acute Rehab PT Goals Patient Stated Goal: to go home PT Goal Formulation: With patient Time For Goal Achievement: 05/13/22 Potential to Achieve Goals: Good Progress towards PT goals: Progressing toward goals    Frequency    Min 5X/week      PT Plan Current plan remains appropriate    Co-evaluation              AM-PAC PT "6 Clicks" Mobility   Outcome Measure  Help needed turning from your back to your side while in a flat bed without using bedrails?: A Little Help needed moving from lying on your back to sitting on the side of a flat bed without using bedrails?: A Little Help needed moving to and from a bed to a chair (including a wheelchair)?: A Little Help needed standing up from a chair using your arms (e.g., wheelchair or bedside chair)?: A Little Help needed to walk in hospital room?: A Lot Help needed climbing 3-5 steps with a railing? : Total 6 Click Score: 15    End of Session Equipment Utilized During Treatment: Gait belt Activity Tolerance: Patient tolerated treatment well Patient left: in chair;with call bell/phone within reach;with chair alarm set Nurse Communication: Mobility status PT Visit Diagnosis: Unsteadiness on feet (R26.81);Muscle weakness (generalized) (M62.81);Difficulty in walking, not elsewhere classified (R26.2)     Time: 2956-2130 PT Time Calculation (min) (ACUTE ONLY): 18 min  Charges:  $Therapeutic Activity: 8-22 mins                     Melanie Carrillo A. Melanie Carrillo PT, DPT Acute Rehabilitation Services Office [phone]    Melanie Carrillo 04/30/2022, 9:03 AM

## 2022-04-30 NOTE — TOC Initial Note (Signed)
Transition of Care [facility]) - Initial/Assessment Note    Patient Details  Name: Melanie Carrillo MRN: [id number] Date of Birth: [birth year]  [facility]) CM/SW Contact:    Sharin Mons, RN Phone Number: 04/30/2022, 10:49 AM  Clinical Narrative:                 - s/p hardware removal and revision fixation of L femur fx, 9/6 NCM  @ bedside with pt to discuss transition of care needs. Pt is from home with daughter [name] and son in Sports coach. Pt gives NCM the ok to share TOC needs with "nurse" daughter. NCM called daughter while in room with pt . NCM shared PT's recommendations for home with home health services, however, daughter feels safer plan will be for ST/SNF. States pt will be home alone @ times. States she is caring also for  husband who is blind. Daughter requesting PT to contact her, NCM made PT aware.  Pt with DME @ R[facility],BSC. Daughter states if home health services are needed [facility] is provider choice, NCM made Claiborne Billings with [facility] aware.  TOC team following and will assist with needs...   Expected Discharge Plan: [place] (vs SNF) Barriers to Discharge: Continued Medical Work up   Patient Goals and CMS Choice     Choice offered to / list presented to : Patient  Expected Discharge Plan and Services Expected Discharge Plan: [place] (vs SNF)   Discharge Planning Services: CM Consult   Living arrangements for the past 2 months: Single Family Home                                      Prior Living Arrangements/Services Living arrangements for the past 2 months: Single Family Home Lives with:: Other (Comment) (daughter and son in law) Patient language and need for interpreter reviewed:: Yes Do you feel safe going back to the place where you live?: Yes      Need for Family Participation in Patient Care: Yes (Comment) Care giver support system in place?: Yes (comment) Current home services: DME (RW,  BSC) Criminal Activity/Legal Involvement Pertinent to Current Situation/Hospitalization: No - Comment as needed  Activities of Daily Living Home Assistive Devices/Equipment: Cane (specify quad or straight), Hearing aid, Walker (specify type), Wheelchair ADL Screening (condition at time of admission) Patient's cognitive ability adequate to safely complete daily activities?: Yes Is the patient deaf or have difficulty hearing?: Yes Does the patient have difficulty seeing, even when wearing glasses/contacts?: No Does the patient have difficulty concentrating, remembering, or making decisions?: No Patient able to express need for assistance with ADLs?: Yes Does the patient have difficulty dressing or bathing?: Yes Independently performs ADLs?: No Communication: Independent Dressing (OT): Needs assistance, Appropriate for developmental age Grooming: Needs assistance Does the patient have difficulty walking or climbing stairs?: Yes Weakness of Legs: Left Weakness of Arms/Hands: None  Permission Sought/Granted   Permission granted to share information with : Yes, Verbal Permission Granted  Share Information with NAME: Earlean Polka  Daughter  [phone]           Emotional Assessment Appearance:: Appears stated age Attitude/Demeanor/Rapport: Gracious Affect (typically observed): Accepting Orientation: : Oriented to Self, Oriented to Place, Oriented to  Time, Oriented to Situation Alcohol / Substance Use: Not Applicable Psych Involvement: No (comment)  Admission diagnosis:  Closed displaced intertrochanteric fracture of  left femur, initial encounter ([place]) [S72.142A] Loosening of intramedullary nail ([place]) [T84.199A] Status post open reduction and internal fixation (ORIF) of fracture [Z98.890, Z87.81] Patient Active Problem List   Diagnosis Date Noted   Loosening of intramedullary nail ([place]) 04/27/2022   Degenerative disk disease 04/27/2022   Hip fracture ([place]) 12/25/2021    Interstitial lung disease ([place]) 12/25/2021   Rhabdomyolysis 12/25/2021   Hyperlipidemia, mixed 09/07/2021   Obstructive sleep apnea 01/29/2014   GERD (gastroesophageal reflux disease) 12/26/2012   Depression 12/26/2012   Essential hypertension 11/07/2002   PCP:  Gayland Curry, MD Pharmacy:   [facility] [address] ([address]), [place] [address] [place]) [place] [address] Phone: [phone] Fax: [phone]  [facility] [address] [place] [address] Phone: [phone] Fax: [phone]     Social Determinants of Health (SDOH) Interventions    Readmission Risk Interventions     No data to display

## 2022-04-30 NOTE — Discharge Instructions (Signed)
Orthopaedic Trauma Service Discharge Instructions   General Discharge Instructions  WEIGHT BEARING STATUS:Touchdown weightbearing left lower extremity  RANGE OF MOTION/ACTIVITY: ok for hip and knee range of motion as tolerated  Wound Care: Incisions can be left open to air if there is no drainage. If incision continues to have drainage, follow wound care instructions below. Okay to shower if no drainage from incisions.  DVT/PE prophylaxis:  Eliquis 2.5 mg twice daily x 30 days  Diet: as you were eating previously.  Can use over the counter stool softeners and bowel preparations, such as Miralax, to help with bowel movements.  Narcotics can be constipating.  Be sure to drink plenty of fluids  PAIN MEDICATION USE AND EXPECTATIONS  You have likely been given narcotic medications to help control your pain.  After a traumatic event that results in an fracture (broken bone) with or without surgery, it is ok to use narcotic pain medications to help control one's pain.  We understand that everyone responds to pain differently and each individual patient will be evaluated on a regular basis for the continued need for narcotic medications. Ideally, narcotic medication use should last no more than 6-8 weeks (coinciding with fracture healing).   As a patient it is your responsibility as well to monitor narcotic medication use and report the amount and frequency you use these medications when you come to your office visit.   We would also advise that if you are using narcotic medications, you should take a dose prior to therapy to maximize you participation.  IF YOU ARE ON NARCOTIC MEDICATIONS IT IS NOT PERMISSIBLE TO OPERATE A MOTOR VEHICLE (MOTORCYCLE/CAR/TRUCK/MOPED) OR HEAVY MACHINERY DO NOT MIX NARCOTICS WITH OTHER CNS (CENTRAL NERVOUS SYSTEM) DEPRESSANTS SUCH AS ALCOHOL   STOP SMOKING OR USING NICOTINE PRODUCTS!!!!  As discussed nicotine severely impairs your body's ability to heal surgical and  traumatic wounds but also impairs bone healing.  Wounds and bone heal by forming microscopic blood vessels (angiogenesis) and nicotine is a vasoconstrictor (essentially, shrinks blood vessels).  Therefore, if vasoconstriction occurs to these microscopic blood vessels they essentially disappear and are unable to deliver necessary nutrients to the healing tissue.  This is one modifiable factor that you can do to dramatically increase your chances of healing your injury.    (This means no smoking, no nicotine gum, patches, etc)  DO NOT USE NONSTEROIDAL ANTI-INFLAMMATORY DRUGS (NSAID'S)  Using products such as Advil (ibuprofen), Aleve (naproxen), Motrin (ibuprofen) for additional pain control during fracture healing can delay and/or prevent the healing response.  If you would like to take over the counter (OTC) medication, Tylenol (acetaminophen) is ok.  However, some narcotic medications that are given for pain control contain acetaminophen as well. Therefore, you should not exceed more than 4000 mg of tylenol in a day if you do not have liver disease.  Also note that there are may OTC medicines, such as cold medicines and allergy medicines that my contain tylenol as well.  If you have any questions about medications and/or interactions please ask your doctor/PA or your pharmacist.      ICE AND ELEVATE INJURED/OPERATIVE EXTREMITY  Using ice and elevating the injured extremity above your heart can help with swelling and pain control.  Icing in a pulsatile fashion, such as 20 minutes on and 20 minutes off, can be followed.    Do not place ice directly on skin. Make sure there is a barrier between to skin and the ice pack.  Using frozen items such as frozen peas works well as the conform nicely to the are that needs to be iced.  USE AN ACE WRAP OR TED HOSE FOR SWELLING CONTROL  In addition to icing and elevation, Ace wraps or TED hose are used to help limit and resolve swelling.  It is recommended to use  Ace wraps or TED hose until you are informed to stop.    When using Ace Wraps start the wrapping distally (farthest away from the body) and wrap proximally (closer to the body)   Example: If you had surgery on your leg or thing and you do not have a splint on, start the ace wrap at the toes and work your way up to the thigh        If you had surgery on your upper extremity and do not have a splint on, start the ace wrap at your fingers and work your way up to the upper arm   [place] REFILLS OR WITH ANY QUESTIONS/CONCERNS: [phone]   VISIT OUR WEBSITE FOR ADDITIONAL INFORMATION: [email or web address]    Discharge Wound Care Instructions  Do NOT apply any ointments, solutions or lotions to pin sites or surgical wounds.  These prevent needed drainage and even though solutions like hydrogen peroxide kill bacteria, they also damage cells lining the pin sites that help fight infection.  Applying lotions or ointments can keep the wounds moist and can cause them to breakdown and open up as well. This can increase the risk for infection. When in doubt call the office.  Once the incision is completely dry and without drainage, it may be left open to air out.  Showering may begin 36-48 hours later.  Cleaning gently with soap and water.

## 2022-04-30 NOTE — NC FL2 (Signed)
[place] LEVEL OF CARE SCREENING TOOL     IDENTIFICATION  Patient Name: Melanie Carrillo Birthdate: 02/27/1935 Sex: female Admission Date (Current Location): 04/27/2022  [facility] and [place] Number:  Herbalist and Address:  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[place]) 04/27/2022   Degenerative disk disease 04/27/2022   Hip fracture ([place]) 12/25/2021   Interstitial lung disease ([place]) 12/25/2021   Rhabdomyolysis 12/25/2021   Hyperlipidemia, mixed 09/07/2021   Obstructive sleep apnea 01/29/2014   GERD (gastroesophageal reflux disease) 12/26/2012   Depression 12/26/2012   Essential hypertension 11/07/2002    Orientation RESPIRATION BLADDER Height & Weight     Self, Time, Situation, Place  Normal External catheter Weight: 163 lb 2.3 oz (74 kg) Height:  '5\' 4"'$  (162.6 cm)  BEHAVIORAL SYMPTOMS/MOOD NEUROLOGICAL BOWEL NUTRITION STATUS      Continent Diet (refer to d/c summary)  AMBULATORY STATUS COMMUNICATION OF NEEDS Skin   Extensive Assist Verbally Normal (s/p hardware removal and revision fixation of L femur fx, 9/6)                       Personal Care Assistance Level of Assistance  Bathing, Feeding, Dressing Bathing Assistance: Limited assistance Feeding assistance: Independent Dressing Assistance: Limited assistance     Functional Limitations Info  Sight, Hearing, Speech Sight Info: Adequate Hearing Info: Adequate Speech Info: Adequate    SPECIAL CARE  FACTORS FREQUENCY  PT (By licensed PT), OT (By licensed OT)     PT Frequency: 5x/week , evaluate and treat OT Frequency: 5x/week , evaluate and treat            Contractures Contractures Info: Not present    Additional Factors Info  Code Status, Allergies Code Status Info: Full code Allergies Info: No Known Allergies           Current Medications (04/30/2022):  This is the current hospital active medication list Current Facility-Administered Medications  Medication Dose Route Frequency Provider Last Rate Last Admin   0.9 %  sodium chloride infusion   Intravenous Continuous Corinne Ports, PA-C   Stopped at 04/29/22 0606   acetaminophen (TYLENOL) tablet 1,000 mg  1,000 mg Oral Q6H McClung, Sarah A, PA-C   1,000 mg at 04/30/22 0930   albuterol (PROVENTIL) (2.5 MG/3ML) 0.083% nebulizer solution 2.5 mg  2.5 mg Nebulization Q2H PRN Corinne Ports, PA-C       amLODipine (NORVASC) tablet 5 mg  5 mg Oral Daily Georgette Shell, MD   5 mg at 04/30/22 0812   brimonidine (ALPHAGAN) 0.2 % ophthalmic solution 1 drop  1 drop Both Eyes BID Corinne Ports, PA-C   1 drop at 04/30/22 0931   docusate sodium (COLACE) capsule 100 mg  100 mg Oral BID Rushie Nyhan A, PA-C   100 mg at 04/30/22 0813   enoxaparin (LOVENOX) injection 40 mg  40 mg Subcutaneous Q24H Rushie Nyhan A, PA-C   40 mg at 04/30/22 [phone]  latanoprost (XALATAN) 0.005 % ophthalmic solution 1 drop  1 drop Both Eyes QHS Rushie Nyhan A, PA-C   1 drop at 04/29/22 2219   metoCLOPramide (REGLAN) tablet 5-10 mg  5-10 mg Oral Q8H PRN Corinne Ports, PA-C       Or   metoCLOPramide (REGLAN) injection 5-10 mg  5-10 mg Intravenous Q8H PRN Corinne Ports, PA-C       montelukast (SINGULAIR) tablet 10 mg  10 mg Oral QHS Rushie Nyhan A, PA-C   10 mg at 04/29/22 2215   oxyCODONE (Oxy IR/ROXICODONE) immediate release tablet 5 mg  5 mg Oral Q4H PRN Corinne Ports, PA-C   5 mg at 04/30/22 5170   Or   morphine (PF) 4 MG/ML injection  4 mg  4 mg Intravenous Q2H PRN Corinne Ports, PA-C   4 mg at 04/27/22 2356   ondansetron (ZOFRAN) tablet 4 mg  4 mg Oral Q6H PRN Corinne Ports, PA-C       Or   ondansetron (ZOFRAN) injection 4 mg  4 mg Intravenous Q6H PRN McClung, Sarah A, PA-C       pantoprazole (PROTONIX) EC tablet 40 mg  40 mg Oral Daily Rushie Nyhan A, PA-C   40 mg at 04/30/22 0813   polyethylene glycol (MIRALAX / GLYCOLAX) packet 17 g  17 g Oral BID Georgette Shell, MD   17 g at 04/30/22 0930   pravastatin (PRAVACHOL) tablet 40 mg  40 mg Oral q1800 Corinne Ports, PA-C   40 mg at 04/29/22 1823   sertraline (ZOLOFT) tablet 25 mg  25 mg Oral Daily Corinne Ports, PA-C   25 mg at 04/30/22 0174     Discharge Medications: Please see discharge summary for a list of discharge medications.  Relevant Imaging Results:  Relevant Lab Results:   Additional Information SS# [id number]  Coralee Pesa, LCSWA

## 2022-04-30 NOTE — Plan of Care (Signed)

## 2022-04-30 NOTE — Care Management Important Message (Signed)
Important Message  Patient Details  Name: Melanie Carrillo MRN: [id number] Date of Birth: May 01, 1935   Medicare Important Message Given:  Yes     Hannah Beat 04/30/2022, 1:03 PM

## 2022-04-30 NOTE — TOC Progression Note (Signed)
Transition of Care [facility]) - Progression Note    Patient Details  Name: Melanie Carrillo MRN: [id number] Date of Birth: June 20, 1935  [facility]) CM/SW [place], [place] Phone Number: 04/30/2022, 2:05 PM  Clinical Narrative:     CSW spoke with dtr [name] who confirmed they would like to proceed with SNF placement. Pt has been to [facility] before and they request she not go back there. CSW notified family that their insurance is in network with a limited number of facilities, those facilities were provided. CSW sent referrals and will follow up with daughter with any updates. TOC will continue to follow.  Expected Discharge Plan: [place] (vs SNF) Barriers to Discharge: Continued Medical Work up  Expected Discharge Plan and Services Expected Discharge Plan: [place] (vs SNF)   Discharge Planning Services: CM Consult   Living arrangements for the past 2 months: Single Family Home                                       Social Determinants of Health (SDOH) Interventions    Readmission Risk Interventions     No data to display

## 2022-05-01 DIAGNOSIS — T84199A Other mechanical complication of internal fixation device of unspecified bone of limb, initial encounter: Secondary | ICD-10-CM | POA: Diagnosis not present

## 2022-05-01 MED ORDER — BISACODYL 5 MG PO TBEC
10.0000 mg | DELAYED_RELEASE_TABLET | Freq: Every day | ORAL | Status: AC
Start: 2022-05-01 — End: 2022-05-04
  Administered 2022-05-01 – 2022-05-02 (×2): 10 mg via ORAL
  Filled 2022-05-01 (×3): qty 2

## 2022-05-01 MED ORDER — SENNOSIDES-DOCUSATE SODIUM 8.6-50 MG PO TABS
2.0000 | ORAL_TABLET | Freq: Two times a day (BID) | ORAL | Status: DC
  Administered 2022-05-01 – 2022-05-02 (×3): 2 via ORAL
  Filled 2022-05-01 (×5): qty 2

## 2022-05-01 NOTE — Progress Notes (Signed)
PT BP ( 84/71), she stated the most of the time she run low because of meds and without them she runs really high but she isn't having any S/S. She been up to the chair for meals and sitting up in bed playing a game on her tablet.

## 2022-05-01 NOTE — Progress Notes (Signed)
Occupational Therapy Treatment Patient Details Name: Melanie Carrillo MRN: [id number] DOB: 20-Dec-1934 Today's Date: 05/01/2022   History of present illness 87 y/o female presented to ED on 04/27/22 due to L hip pain following feeling a pop sound in her L hip.  CT showed a fx of the intramedullary rod of LLE. S/p on 04/28/22 repair of L subtrochanteric nonunion, removal of hardware and fixation of femerol neck. Marland Kitchen PMH includes: L IM nailing(5/23), GERD, interstitial lung disease and HTN.   OT comments  Pt. Seen for skilled OT treatment session.  Pt. Able to demonstrate simulated toileting tasks with min guard assist. Good adherence to wbs.  Very motivated to stay active.  Continue with acute OT goals.     Recommendations for follow up therapy are one component of a multi-disciplinary discharge planning process, led by the attending physician.  Recommendations may be updated based on patient status, additional functional criteria and insurance authorization.    Follow Up Recommendations  Home health OT    Assistance Recommended at Discharge Frequent or constant Supervision/Assistance  Patient can return home with the following  A little help with walking and/or transfers;A lot of help with bathing/dressing/bathroom;Assistance with cooking/housework;Direct supervision/assist for medications management;Direct supervision/assist for financial management;Assist for transportation   Equipment Recommendations  Tub/shower seat    Recommendations for Other Services      Precautions / Restrictions Precautions Precautions: Fall Restrictions Weight Bearing Restrictions: Yes LLE Weight Bearing: Touchdown weight bearing       Mobility Bed Mobility               General bed mobility comments: in recliner at beg. and end of session    Transfers Overall transfer level: Needs assistance Equipment used: Rolling walker (2 wheels) Transfers: Sit to/from Stand, Bed to chair/wheelchair/BSC Sit to  Stand: Min guard Stand pivot transfers: Min guard         General transfer comment: able to pivot with no lob noted or physical assist. states "i just act like i cant put any weight on it, its easier to remember that way for now"     Balance                                           ADL either performed or assessed with clinical judgement   ADL Overall ADL's : Needs assistance/impaired                         Toilet Transfer: Min guard;Cueing for safety;Cueing for sequencing;Rolling walker (2 wheels) Toilet Transfer Details (indicate cue type and reason): simulated from recliner to eob. pt. reports she has been using 3n1         Functional mobility during ADLs: Min guard;Rolling walker (2 wheels);Cueing for sequencing;Cueing for safety General ADL Comments: moving well good adhearance to precautions    Extremity/Trunk Assessment              Vision       Perception     Praxis      Cognition Arousal/Alertness: Awake/alert Behavior During Therapy: WFL for tasks assessed/performed Overall Cognitive Status: No family/caregiver present to determine baseline cognitive functioning  Exercises      Shoulder Instructions       General Comments  Loves college f.ball [employer] is "my number 1 but I like all [employer] teams"    Pertinent Vitals/ Pain       Pain Assessment Pain Assessment: No/denies pain  Home Living                                          Prior Functioning/Environment              Frequency  Min 2X/week        Progress Toward Goals  OT Goals(current goals can now be found in the care plan section)  Progress towards OT goals: Progressing toward goals     Plan      Co-evaluation                 AM-PAC OT "6 Clicks" Daily Activity     Outcome Measure   Help from another person eating meals?: None Help from another person taking  care of personal grooming?: A Little Help from another person toileting, which includes using toliet, bedpan, or urinal?: A Lot Help from another person bathing (including washing, rinsing, drying)?: A Lot Help from another person to put on and taking off regular upper body clothing?: A Little Help from another person to put on and taking off regular lower body clothing?: A Lot 6 Click Score: 16    End of Session Equipment Utilized During Treatment: Gait belt;Rolling walker (2 wheels)  OT Visit Diagnosis: Unsteadiness on feet (R26.81);Other abnormalities of gait and mobility (R26.89);Repeated falls (R29.6);Muscle weakness (generalized) (M62.81);Pain Pain - Right/Left: Left Pain - part of body: Leg   Activity Tolerance Patient tolerated treatment well   Patient Left in chair;with call bell/phone within reach   Nurse Communication          Time: 6568-1275 OT Time Calculation (min): 11 min  Charges: OT General Charges $OT Visit: 1 Visit OT Treatments $Self Care/Home Management : 8-22 mins  Sonia Baller, COTA/L Acute Rehabilitation [phone]   Tanya Nones 05/01/2022, 1:21 PM

## 2022-05-01 NOTE — Progress Notes (Signed)
PROGRESS NOTE    Melanie Carrillo  OHY:[id number] DOB: 22-Jul-1935 DOA: 04/27/2022 PCP: Gayland Curry, MD   Brief Narrative: 87 year old female with ORIF and intramedullary rod in May 2023 for intertrochanteric fracture readmitted with fracture of the intramedullary rod at the level of the interlocking screw.  Assessment & Plan:   Principal Problem:   Loosening of intramedullary nail (HCC) Active Problems:   Interstitial lung disease ([place])   Essential hypertension   GERD (gastroesophageal reflux disease)   Hyperlipidemia, mixed   Obstructive sleep apnea   #1 fracture of the left intramedullary rod-status post hardware removal and revision fixation of left femur fracture.  Patient reports some pain at the surgical site otherwise she feels better.  Her vitamin D level is normal. On oxycodone for pain control Lovenox for DVT prophylaxis Continue bowel regime Await further PT eval regarding disposition.  #2 history of essential hypertension blood pressure high recorded blood pressure is 83/65 today.  DC Norvasc continue to hold Cozaar.  I asked for stat vitals to be checked again.  Patient is asymptomatic.  She is sitting up in the recliner.    #3 interstitial lung disease stable on room air-encourage incentive spirometer  #4 obstructive sleep apnea stable  #5 glaucoma continue eyedrops  #6 hyperlipidemia on statins  #7 GERD on Protonix    Estimated body mass index is 28 kg/m as calculated from the following:   Height as of this encounter: '5\' 4"'$  (1.626 m).   Weight as of this encounter: 74 kg.  DVT prophylaxis: Lovenox Code Status: Full code Family Communication: Discussed with daughter at bedside Disposition Plan:  Status is: Inpatient Remains inpatient appropriate because: Await safe discharge Consultants:  Ortho Procedures left hip removal of hardware and revision fixation of left femur fracture antimicrobials: None  Subjective: Patient is resting in bed in no  acute distress still has not had a BM  objective: Vitals:   04/30/22 0812 04/30/22 1952 05/01/22 0419 05/01/22 0740  BP: (!) 141/58 135/69 (!) 157/68 (!) 83/65  Pulse:  100 78 79  Resp:  '17 15 18  '$ Temp:  98 F (36.7 C) 97.9 F (36.6 C) 97.8 F (36.6 C)  TempSrc:  Oral Oral Oral  SpO2:  92% 96% 96%  Weight:      Height:        Intake/Output Summary (Last 24 hours) at 05/01/2022 1410 Last data filed at 05/01/2022 0800 Gross per 24 hour  Intake 240 ml  Output 1400 ml  Net -1160 ml    Filed Weights   04/27/22 1357  Weight: 74 kg    Examination:  General exam: Appears in no acute distress  respiratory system: Clear to auscultation. Respiratory effort normal. Cardiovascular system: S1 & S2 heard, RRR. No JVD, murmurs, rubs, gallops or clicks. No pedal edema. Gastrointestinal system: Abdomen is nondistended, soft and nontender. No organomegaly or masses felt. Normal bowel sounds heard. Central nervous system: Alert and oriented. No focal neurological deficits. Extremities: No edema.  Left hip incision clean dry intact Skin: No rashes, lesions or ulcers Psychiatry: Judgement and insight appear normal. Mood & affect appropriate.     Data Reviewed: I have personally reviewed following labs and imaging studies  CBC: Recent Labs  Lab 04/27/22 2012 04/28/22 2218 04/29/22 0104 04/30/22 0102  WBC 6.8 8.5 6.6 7.1  HGB 13.6 10.7* 10.0* 8.9*  HCT 42.1 32.1* 30.6* 26.4*  MCV 103.2* 103.2* 102.3* 102.3*  PLT 225 164 173 948    Basic Metabolic Panel:  Recent Labs  Lab 04/27/22 2012 04/28/22 2218 04/29/22 0104 04/30/22 0102  NA 138  --  138 138  K 4.0  --  4.1 4.1  CL 107  --  108 104  CO2 24  --  24 25  GLUCOSE 119*  --  158* 116*  BUN 10  --  6* 11  CREATININE 0.65 0.59 0.56 0.61  CALCIUM 10.4*  --  9.3 9.4    GFR: Estimated Creatinine Clearance: 50.6 mL/min (by C-G formula based on SCr of 0.61 mg/dL). Liver Function Tests: Recent Labs  Lab 04/29/22 0104  AST  26  ALT 13  ALKPHOS 46  BILITOT 0.6  PROT 6.0*  ALBUMIN 3.3*    No results for input(s): "LIPASE", "AMYLASE" in the last 168 hours. No results for input(s): "AMMONIA" in the last 168 hours. Coagulation Profile: No results for input(s): "INR", "PROTIME" in the last 168 hours. Cardiac Enzymes: No results for input(s): "CKTOTAL", "CKMB", "CKMBINDEX", "TROPONINI" in the last 168 hours. BNP (last 3 results) No results for input(s): "PROBNP" in the last 8760 hours. HbA1C: No results for input(s): "HGBA1C" in the last 72 hours. CBG: No results for input(s): "GLUCAP" in the last 168 hours. Lipid Profile: No results for input(s): "CHOL", "HDL", "LDLCALC", "TRIG", "CHOLHDL", "LDLDIRECT" in the last 72 hours. Thyroid Function Tests: No results for input(s): "TSH", "T4TOTAL", "FREET4", "T3FREE", "THYROIDAB" in the last 72 hours. Anemia Panel: No results for input(s): "VITAMINB12", "FOLATE", "FERRITIN", "TIBC", "IRON", "RETICCTPCT" in the last 72 hours. Sepsis Labs: No results for input(s): "PROCALCITON", "LATICACIDVEN" in the last 168 hours.  Recent Results (from the past 240 hour(s))  Surgical pcr screen     Status: None   Collection Time: 04/28/22 10:45 AM   Specimen: Nasal Mucosa; Nasal Swab  Result Value Ref Range Status   MRSA, PCR NEGATIVE NEGATIVE Final   Staphylococcus aureus NEGATIVE NEGATIVE Final    Comment: (NOTE) The Xpert SA Assay (FDA approved for NASAL specimens in patients 49 years of age and older), is one component of a comprehensive surveillance program. It is not intended to diagnose infection nor to guide or monitor treatment. Performed at [facility], [place] [address], [place], [place] [address]          Radiology Studies: No results found.      Scheduled Meds:  acetaminophen  1,000 mg Oral Q6H   amLODipine  5 mg Oral Daily   bisacodyl  10 mg Oral Daily   brimonidine  1 drop Both Eyes BID   docusate sodium  100 mg Oral BID   enoxaparin  (LOVENOX) injection  40 mg Subcutaneous Q24H   latanoprost  1 drop Both Eyes QHS   montelukast  10 mg Oral QHS   pantoprazole  40 mg Oral Daily   polyethylene glycol  17 g Oral BID   pravastatin  40 mg Oral q1800   senna-docusate  2 tablet Oral BID   sertraline  25 mg Oral Daily   Continuous Infusions:  sodium chloride Stopped (04/29/22 0606)     LOS: 4 days    Time spent: 35 minutes    Georgette Shell, MD 05/01/2022, 2:10 PM

## 2022-05-02 DIAGNOSIS — T84199A Other mechanical complication of internal fixation device of unspecified bone of limb, initial encounter: Secondary | ICD-10-CM | POA: Diagnosis not present

## 2022-05-02 LAB — COMPREHENSIVE METABOLIC PANEL
ALT: 26 U/L (ref 0–44)
AST: 31 U/L (ref 15–41)
Albumin: 3.1 g/dL — ABNORMAL LOW (ref 3.5–5.0)
Alkaline Phosphatase: 80 U/L (ref 38–126)
Anion gap: 7 (ref 5–15)
BUN: 8 mg/dL (ref 8–23)
CO2: 23 mmol/L (ref 22–32)
Calcium: 9.6 mg/dL (ref 8.9–10.3)
Chloride: 101 mmol/L (ref 98–111)
Creatinine, Ser: 0.53 mg/dL (ref 0.44–1.00)
GFR, Estimated: >60 mL/min (ref >60)
Glucose, Bld: 110 mg/dL — ABNORMAL HIGH (ref 70–99)
Potassium: 4.1 mmol/L (ref 3.5–5.1)
Sodium: 131 mmol/L — ABNORMAL LOW (ref 135–145)
Total Bilirubin: 1.1 mg/dL (ref 0.3–1.2)
Total Protein: 6.6 g/dL (ref 6.5–8.1)

## 2022-05-02 LAB — CBC
HCT: 30.8 % — ABNORMAL LOW (ref 36.0–46.0)
Hemoglobin: 10 g/dL — ABNORMAL LOW (ref 12.0–15.0)
MCH: 33.6 pg (ref 26.0–34.0)
MCHC: 32.5 g/dL (ref 30.0–36.0)
MCV: 103.4 fL — ABNORMAL HIGH (ref 80.0–100.0)
Platelets: 248 10*3/uL (ref 150–400)
RBC: 2.98 MIL/uL — ABNORMAL LOW (ref 3.87–5.11)
RDW: 12.3 % (ref 11.5–15.5)
WBC: 10.5 10*3/uL (ref 4.0–10.5)
nRBC: 0 % (ref 0.0–0.2)

## 2022-05-02 MED ORDER — METOPROLOL TARTRATE 12.5 MG HALF TABLET
12.5000 mg | ORAL_TABLET | Freq: Every day | ORAL | Status: DC
Start: 2022-05-02 — End: 2022-05-04
  Administered 2022-05-02 – 2022-05-04 (×3): 12.5 mg via ORAL
  Filled 2022-05-02 (×3): qty 1

## 2022-05-02 NOTE — Progress Notes (Signed)
PROGRESS NOTE    Melanie Carrillo  UUE:[id number] DOB: 1934/09/24 DOA: 04/27/2022 PCP: Gayland Curry, MD   Brief Narrative: 87 year old female with ORIF and intramedullary rod in May 2023 for intertrochanteric fracture readmitted with fracture of the intramedullary rod at the level of the interlocking screw.  Assessment & Plan:   Principal Problem:   Loosening of intramedullary nail (HCC) Active Problems:   Interstitial lung disease ([place])   Essential hypertension   GERD (gastroesophageal reflux disease)   Hyperlipidemia, mixed   Obstructive sleep apnea   #1 fracture of the left intramedullary rod-status post hardware removal and revision fixation of left femur fracture.  Patient reports some pain at the surgical site otherwise she feels better.  Her vitamin D level is normal. On oxycodone for pain control Lovenox for DVT prophylaxis Continue bowel regime Await further PT eval regarding disposition.  #2 history of essential hypertension -her blood pressure is very labile she was on Norvasc and Cozaar prior to admission.  Her blood pressure was 83/65 yesterday though she was asymptomatic from this.  Today her blood pressure is high and she is tachycardic.  I will start her on a low-dose of metoprolol.   #3 interstitial lung disease stable on room air-encourage incentive spirometer  #4 obstructive sleep apnea stable  #5 glaucoma continue eyedrops  #6 hyperlipidemia on statins  #7 GERD on Protonix    Estimated body mass index is 27.97 kg/m as calculated from the following:   Height as of this encounter: '5\' 4"'$  (1.626 m).   Weight as of this encounter: 73.9 kg.  DVT prophylaxis: Lovenox Code Status: Full code Family Communication: Discussed with daughter Disposition Plan:  Status is: Inpatient Remains inpatient appropriate because: Await safe discharge Consultants:  Ortho Procedures left hip removal of hardware and revision fixation of left femur fracture antimicrobials:  None  Subjective: Patient resting in bed She had 2 BMs yesterday No other new complaints  objective: Vitals:   05/01/22 2344 05/02/22 0436 05/02/22 0500 05/02/22 0904  BP: (!) 158/75 92/68  (!) 159/69  Pulse:  98  (!) 103  Resp:  17  20  Temp:  98 F (36.7 C)  99.1 F (37.3 C)  TempSrc:    Oral  SpO2:  95%  96%  Weight:   73.9 kg   Height:        Intake/Output Summary (Last 24 hours) at 05/02/2022 1150 Last data filed at 05/01/2022 2000 Gross per 24 hour  Intake 360 ml  Output --  Net 360 ml    Filed Weights   04/27/22 1357 05/02/22 0500  Weight: 74 kg 73.9 kg    Examination:  General exam: Appears in no acute distress  respiratory system: Clear to auscultation. Respiratory effort normal. Cardiovascular system: S1 & S2 heard, RRR. No JVD, murmurs, rubs, gallops or clicks. No pedal edema. Gastrointestinal system: Abdomen is nondistended, soft and nontender. No organomegaly or masses felt. Normal bowel sounds heard. Central nervous system: Alert and oriented. No focal neurological deficits. Extremities: No edema.  Left hip incision clean dry intact Skin: No rashes, lesions or ulcers Psychiatry: Judgement and insight appear normal. Mood & affect appropriate.     Data Reviewed: I have personally reviewed following labs and imaging studies  CBC: Recent Labs  Lab 04/27/22 2012 04/28/22 2218 04/29/22 0104 04/30/22 0102  WBC 6.8 8.5 6.6 7.1  HGB 13.6 10.7* 10.0* 8.9*  HCT 42.1 32.1* 30.6* 26.4*  MCV 103.2* 103.2* 102.3* 102.3*  PLT 225 164 173  540    Basic Metabolic Panel: Recent Labs  Lab 04/27/22 2012 04/28/22 2218 04/29/22 0104 04/30/22 0102  NA 138  --  138 138  K 4.0  --  4.1 4.1  CL 107  --  108 104  CO2 24  --  24 25  GLUCOSE 119*  --  158* 116*  BUN 10  --  6* 11  CREATININE 0.65 0.59 0.56 0.61  CALCIUM 10.4*  --  9.3 9.4    GFR: Estimated Creatinine Clearance: 50.6 mL/min (by C-G formula based on SCr of 0.61 mg/dL). Liver Function  Tests: Recent Labs  Lab 04/29/22 0104  AST 26  ALT 13  ALKPHOS 46  BILITOT 0.6  PROT 6.0*  ALBUMIN 3.3*    No results for input(s): "LIPASE", "AMYLASE" in the last 168 hours. No results for input(s): "AMMONIA" in the last 168 hours. Coagulation Profile: No results for input(s): "INR", "PROTIME" in the last 168 hours. Cardiac Enzymes: No results for input(s): "CKTOTAL", "CKMB", "CKMBINDEX", "TROPONINI" in the last 168 hours. BNP (last 3 results) No results for input(s): "PROBNP" in the last 8760 hours. HbA1C: No results for input(s): "HGBA1C" in the last 72 hours. CBG: No results for input(s): "GLUCAP" in the last 168 hours. Lipid Profile: No results for input(s): "CHOL", "HDL", "LDLCALC", "TRIG", "CHOLHDL", "LDLDIRECT" in the last 72 hours. Thyroid Function Tests: No results for input(s): "TSH", "T4TOTAL", "FREET4", "T3FREE", "THYROIDAB" in the last 72 hours. Anemia Panel: No results for input(s): "VITAMINB12", "FOLATE", "FERRITIN", "TIBC", "IRON", "RETICCTPCT" in the last 72 hours. Sepsis Labs: No results for input(s): "PROCALCITON", "LATICACIDVEN" in the last 168 hours.  Recent Results (from the past 240 hour(s))  Surgical pcr screen     Status: None   Collection Time: 04/28/22 10:45 AM   Specimen: Nasal Mucosa; Nasal Swab  Result Value Ref Range Status   MRSA, PCR NEGATIVE NEGATIVE Final   Staphylococcus aureus NEGATIVE NEGATIVE Final    Comment: (NOTE) The Xpert SA Assay (FDA approved for NASAL specimens in patients 84 years of age and older), is one component of a comprehensive surveillance program. It is not intended to diagnose infection nor to guide or monitor treatment. Performed at [facility], [place] [address] [place], [place] [address]          Radiology Studies: No results found.      Scheduled Meds:  acetaminophen  1,000 mg Oral Q6H   bisacodyl  10 mg Oral Daily   brimonidine  1 drop Both Eyes BID   docusate sodium  100 mg Oral BID    enoxaparin (LOVENOX) injection  40 mg Subcutaneous Q24H   latanoprost  1 drop Both Eyes QHS   montelukast  10 mg Oral QHS   pantoprazole  40 mg Oral Daily   polyethylene glycol  17 g Oral BID   pravastatin  40 mg Oral q1800   senna-docusate  2 tablet Oral BID   sertraline  25 mg Oral Daily   Continuous Infusions:  sodium chloride Stopped (04/29/22 0606)     LOS: 5 days    Time spent: 35 minutes    Georgette Shell, MD 05/02/2022, 11:50 AM

## 2022-05-03 ENCOUNTER — Encounter (HOSPITAL_COMMUNITY): Payer: Self-pay | Admitting: Student

## 2022-05-03 DIAGNOSIS — T84199A Other mechanical complication of internal fixation device of unspecified bone of limb, initial encounter: Secondary | ICD-10-CM | POA: Diagnosis not present

## 2022-05-03 NOTE — TOC Progression Note (Signed)
Transition of Care [facility]) - Progression Note    Patient Details  Name: Melanie Carrillo MRN: [id number] Date of Birth: Apr 16, 1935  Transition of Care [facility] CM/SW [place], [place] Phone Number: 05/03/2022, 11:51 AM  Clinical Narrative:     CSW met with pt and daughter at bedside to discuss DC options. Family agreeable to [facility] [place] updated facility and authorization was started. Family states they will be able to transport pt as long as PT says it is ok. CSW answered questions about home services and confirmed for family that Medicare will not cover the cost of a ramp. TOC will continue to follow for DC needs.  Expected Discharge Plan: [place] (vs SNF) Barriers to Discharge: Continued Medical Work up  Expected Discharge Plan and Services Expected Discharge Plan: [address] (vs SNF)   Discharge Planning Services: CM Consult   Living arrangements for the past 2 months: Single Family Home                                       Social Determinants of Health (SDOH) Interventions    Readmission Risk Interventions     No data to display

## 2022-05-03 NOTE — Progress Notes (Signed)
Physical Therapy Treatment Patient Details Name: Melanie Carrillo MRN: [id number] DOB: 1935-03-27 Today's Date: 05/03/2022   History of Present Illness 87 y/o female presented to ED on 04/27/22 due to L hip pain following feeling a pop sound in her L hip.  CT showed a fx of the intramedullary rod of LLE. S/p on 04/28/22 repair of L subtrochanteric nonunion, removal of hardware and fixation of femerol neck. Marland Kitchen PMH includes: L IM nailing(5/23), GERD, interstitial lung disease and HTN.    PT Comments    Pt instructed in and completed therapeutic activities including bed <> chair via stand pivot transfer and sets/reps of consecutive sit to stands. Pt required min guard assist for safe transfers. Pt maintained her WB status well with verbal cues. Pt will continue to benefit from skilled, acute care physical therapy interventions to maximize her strength and functional abilities.   Recommendations for follow up therapy are one component of a multi-disciplinary discharge planning process, led by the attending physician.  Recommendations may be updated based on patient status, additional functional criteria and insurance authorization.  Follow Up Recommendations  Skilled nursing-short term rehab (<3 hours/day)     Assistance Recommended at Discharge Frequent or constant Supervision/Assistance  Patient can return home with the following A little help with walking and/or transfers;A little help with bathing/dressing/bathroom;Assistance with cooking/housework;Assist for transportation;Help with stairs or ramp for entrance   Equipment Recommendations  None recommended by PT    Recommendations for Other Services       Precautions / Restrictions Precautions Precautions: Fall Restrictions Weight Bearing Restrictions: Yes LLE Weight Bearing: Touchdown weight bearing     Mobility  Bed Mobility Overal bed mobility: Needs Assistance Bed Mobility: Supine to Sit, Sit to Supine     Supine to sit:  Supervision Sit to supine: Supervision   General bed mobility comments: supervision for safety. No assistance required    Transfers Overall transfer level: Needs assistance Equipment used: Rolling walker (2 wheels) Transfers: Bed to chair/wheelchair/BSC, Sit to/from Stand Sit to Stand: Min guard Stand pivot transfers: Min guard         General transfer comment: Pt performed stand pivot transfer from bed to chair followed by chair to bed. Pt performed 2 sets of 3 consecutive reps of sit to stands from chair between stand pivot transfers. Pt provided with cues throughout for sequencing and maintenance of WB status. No LOB occurred.    Ambulation/Gait               General Gait Details: unable   Stairs             Wheelchair Mobility    Modified Rankin (Stroke Patients Only)       Balance Overall balance assessment: Needs assistance Sitting-balance support: No upper extremity supported Sitting balance-Leahy Scale: Good     Standing balance support: Bilateral upper extremity supported, Reliant on assistive device for balance, Single extremity supported Standing balance-Leahy Scale: Fair Standing balance comment: balance not challenged this session                            Cognition Arousal/Alertness: Awake/alert Behavior During Therapy: WFL for tasks assessed/performed Overall Cognitive Status: Difficult to assess                                 General Comments: Pt pleasant and cooperative. Pt following all commands appropriately but  needs increased cues to maintain WB status.        Exercises      General Comments General comments (skin integrity, edema, etc.): HR and SpO2 stable on RA      Pertinent Vitals/Pain Pain Assessment Pain Assessment: 0-10 Pain Score: 3  Faces Pain Scale: Hurts little more Pain Location: LLE Pain Descriptors / Indicators: Discomfort Pain Intervention(s): Premedicated before session,  Monitored during session, Limited activity within patient's tolerance    Home Living                          Prior Function            PT Goals (current goals can now be found in the care plan section) Acute Rehab PT Goals Patient Stated Goal: to go home PT Goal Formulation: With patient Time For Goal Achievement: 05/13/22 Potential to Achieve Goals: Good Progress towards PT goals: Progressing toward goals    Frequency    Min 3X/week      PT Plan Current plan remains appropriate    Co-evaluation              AM-PAC PT "6 Clicks" Mobility   Outcome Measure  Help needed turning from your back to your side while in a flat bed without using bedrails?: A Little Help needed moving from lying on your back to sitting on the side of a flat bed without using bedrails?: A Little Help needed moving to and from a bed to a chair (including a wheelchair)?: A Little Help needed standing up from a chair using your arms (e.g., wheelchair or bedside chair)?: A Little Help needed to walk in hospital room?: A Lot Help needed climbing 3-5 steps with a railing? : Total 6 Click Score: 15    End of Session Equipment Utilized During Treatment: Gait belt Activity Tolerance: Patient tolerated treatment well Patient left: with call bell/phone within reach;in bed;with family/visitor present Nurse Communication: Mobility status PT Visit Diagnosis: Unsteadiness on feet (R26.81);Muscle weakness (generalized) (M62.81);Difficulty in walking, not elsewhere classified (R26.2)     Time: 1740-8144 PT Time Calculation (min) (ACUTE ONLY): 20 min  Charges:  $Therapeutic Activity: 8-22 mins                     Donna Bernard, PT    [facility] 05/03/2022, 2:25 PM

## 2022-05-03 NOTE — Progress Notes (Signed)
PROGRESS NOTE    Melanie Carrillo  ZOX:[id number] DOB: September 01, 1934 DOA: 04/27/2022 PCP: Gayland Curry, MD   Brief Narrative: 87 year old female with ORIF and intramedullary rod in May 2023 for intertrochanteric fracture readmitted with fracture of the intramedullary rod at the level of the interlocking screw.  Assessment & Plan:   Principal Problem:   Loosening of intramedullary nail (HCC) Active Problems:   Interstitial lung disease ([place])   Essential hypertension   GERD (gastroesophageal reflux disease)   Hyperlipidemia, mixed   Obstructive sleep apnea   #1 fracture of the left intramedullary rod-status post hardware removal and revision fixation of left femur fracture.  Patient reports some pain at the surgical site otherwise she feels better.  Her vitamin D level is normal. On oxycodone for pain control Lovenox for DVT prophylaxis Continue bowel regime PT eval rec snf  disposition.  #2 history of essential hypertension -her blood pressure is very labile she was on Norvasc and Cozaar prior to admission. Now on metoprolol with tachycardia  #3 interstitial lung disease stable on room air-encourage incentive spirometer  #4 obstructive sleep apnea stable  #5 glaucoma continue eyedrops  #6 hyperlipidemia on statins  #7 GERD on Protonix    Estimated body mass index is 28.08 kg/m as calculated from the following:   Height as of this encounter: '5\' 4"'$  (1.626 m).   Weight as of this encounter: 74.2 kg.  DVT prophylaxis: Lovenox Code Status: Full code Family Communication: Discussed with daughter Disposition Plan:  Status is: Inpatient snf Remains inpatient appropriate because: Await safe discharge Consultants:  Ortho Procedures left hip removal of hardware and revision fixation of left femur fracture antimicrobials: None  Subjective:   No new c/o objective: Vitals:   05/02/22 1504 05/02/22 2111 05/03/22 0500 05/03/22 0752  BP: (!) 167/70 (!) 131/59  (!) 161/64   Pulse: 98 85  96  Resp: '20 16  16  '$ Temp: 99.6 F (37.6 C) 98.5 F (36.9 C)  98.5 F (36.9 C)  TempSrc: Oral Oral  Oral  SpO2: 94% 96%  96%  Weight:   74.2 kg   Height:        Intake/Output Summary (Last 24 hours) at 05/03/2022 1040 Last data filed at 05/02/2022 2200 Gross per 24 hour  Intake 300 ml  Output --  Net 300 ml    Filed Weights   04/27/22 1357 05/02/22 0500 05/03/22 0500  Weight: 74 kg 73.9 kg 74.2 kg    Examination:  General exam: Appears in no acute distress  respiratory system: Clear to auscultation. Respiratory effort normal. Cardiovascular system: S1 & S2 heard, RRR. No JVD, murmurs, rubs, gallops or clicks. No pedal edema. Gastrointestinal system: Abdomen is nondistended, soft and nontender. No organomegaly or masses felt. Normal bowel sounds heard. Central nervous system: Alert and oriented. No focal neurological deficits. Extremities: No edema.  Left hip incision clean dry intact Skin: No rashes, lesions or ulcers Psychiatry: Judgement and insight appear normal. Mood & affect appropriate.     Data Reviewed: I have personally reviewed following labs and imaging studies  CBC: Recent Labs  Lab 04/27/22 2012 04/28/22 2218 04/29/22 0104 04/30/22 0102 05/02/22 1201  WBC 6.8 8.5 6.6 7.1 10.5  HGB 13.6 10.7* 10.0* 8.9* 10.0*  HCT 42.1 32.1* 30.6* 26.4* 30.8*  MCV 103.2* 103.2* 102.3* 102.3* 103.4*  PLT 225 164 173 161 811    Basic Metabolic Panel: Recent Labs  Lab 04/27/22 2012 04/28/22 2218 04/29/22 0104 04/30/22 0102 05/02/22 1201  NA 138  --  138 138 131*  K 4.0  --  4.1 4.1 4.1  CL 107  --  108 104 101  CO2 24  --  '24 25 23  '$ GLUCOSE 119*  --  158* 116* 110*  BUN 10  --  6* 11 8  CREATININE 0.65 0.59 0.56 0.61 0.53  CALCIUM 10.4*  --  9.3 9.4 9.6    GFR: Estimated Creatinine Clearance: 50.7 mL/min (by C-G formula based on SCr of 0.53 mg/dL). Liver Function Tests: Recent Labs  Lab 04/29/22 0104 05/02/22 1201  AST 26 31  ALT  13 26  ALKPHOS 46 80  BILITOT 0.6 1.1  PROT 6.0* 6.6  ALBUMIN 3.3* 3.1*    No results for input(s): "LIPASE", "AMYLASE" in the last 168 hours. No results for input(s): "AMMONIA" in the last 168 hours. Coagulation Profile: No results for input(s): "INR", "PROTIME" in the last 168 hours. Cardiac Enzymes: No results for input(s): "CKTOTAL", "CKMB", "CKMBINDEX", "TROPONINI" in the last 168 hours. BNP (last 3 results) No results for input(s): "PROBNP" in the last 8760 hours. HbA1C: No results for input(s): "HGBA1C" in the last 72 hours. CBG: No results for input(s): "GLUCAP" in the last 168 hours. Lipid Profile: No results for input(s): "CHOL", "HDL", "LDLCALC", "TRIG", "CHOLHDL", "LDLDIRECT" in the last 72 hours. Thyroid Function Tests: No results for input(s): "TSH", "T4TOTAL", "FREET4", "T3FREE", "THYROIDAB" in the last 72 hours. Anemia Panel: No results for input(s): "VITAMINB12", "FOLATE", "FERRITIN", "TIBC", "IRON", "RETICCTPCT" in the last 72 hours. Sepsis Labs: No results for input(s): "PROCALCITON", "LATICACIDVEN" in the last 168 hours.  Recent Results (from the past 240 hour(s))  Surgical pcr screen     Status: None   Collection Time: 04/28/22 10:45 AM   Specimen: Nasal Mucosa; Nasal Swab  Result Value Ref Range Status   MRSA, PCR NEGATIVE NEGATIVE Final   Staphylococcus aureus NEGATIVE NEGATIVE Final    Comment: (NOTE) The Xpert SA Assay (FDA approved for NASAL specimens in patients 56 years of age and older), is one component of a comprehensive surveillance program. It is not intended to diagnose infection nor to guide or monitor treatment. Performed at [facility], [place] [address] [place], [place] [address]          Radiology Studies: No results found.      Scheduled Meds:  acetaminophen  1,000 mg Oral Q6H   bisacodyl  10 mg Oral Daily   brimonidine  1 drop Both Eyes BID   docusate sodium  100 mg Oral BID   enoxaparin (LOVENOX) injection  40  mg Subcutaneous Q24H   latanoprost  1 drop Both Eyes QHS   metoprolol tartrate  12.5 mg Oral Daily   montelukast  10 mg Oral QHS   pantoprazole  40 mg Oral Daily   polyethylene glycol  17 g Oral BID   pravastatin  40 mg Oral q1800   senna-docusate  2 tablet Oral BID   sertraline  25 mg Oral Daily   Continuous Infusions:  sodium chloride Stopped (04/29/22 0606)     LOS: 6 days    Time spent: 35 minutes    Georgette Shell, MD 05/03/2022, 10:40 AM

## 2022-05-04 ENCOUNTER — Other Ambulatory Visit (HOSPITAL_COMMUNITY): Payer: Self-pay

## 2022-05-04 DIAGNOSIS — I10 Essential (primary) hypertension: Secondary | ICD-10-CM | POA: Diagnosis not present

## 2022-05-04 DIAGNOSIS — T84199A Other mechanical complication of internal fixation device of unspecified bone of limb, initial encounter: Secondary | ICD-10-CM | POA: Diagnosis not present

## 2022-05-04 DIAGNOSIS — S72009S Fracture of unspecified part of neck of unspecified femur, sequela: Secondary | ICD-10-CM | POA: Diagnosis not present

## 2022-05-04 DIAGNOSIS — T84199D Other mechanical complication of internal fixation device of unspecified bone of limb, subsequent encounter: Secondary | ICD-10-CM | POA: Diagnosis not present

## 2022-05-04 DIAGNOSIS — G4733 Obstructive sleep apnea (adult) (pediatric): Secondary | ICD-10-CM | POA: Diagnosis not present

## 2022-05-04 DIAGNOSIS — K219 Gastro-esophageal reflux disease without esophagitis: Secondary | ICD-10-CM | POA: Diagnosis not present

## 2022-05-04 DIAGNOSIS — M6281 Muscle weakness (generalized): Secondary | ICD-10-CM | POA: Diagnosis not present

## 2022-05-04 DIAGNOSIS — E785 Hyperlipidemia, unspecified: Secondary | ICD-10-CM | POA: Diagnosis not present

## 2022-05-04 DIAGNOSIS — J849 Interstitial pulmonary disease, unspecified: Secondary | ICD-10-CM | POA: Diagnosis not present

## 2022-05-04 DIAGNOSIS — M519 Unspecified thoracic, thoracolumbar and lumbosacral intervertebral disc disorder: Secondary | ICD-10-CM | POA: Diagnosis not present

## 2022-05-04 DIAGNOSIS — G4700 Insomnia, unspecified: Secondary | ICD-10-CM | POA: Diagnosis not present

## 2022-05-04 DIAGNOSIS — S72142D Displaced intertrochanteric fracture of left femur, subsequent encounter for closed fracture with routine healing: Secondary | ICD-10-CM | POA: Diagnosis not present

## 2022-05-04 DIAGNOSIS — H409 Unspecified glaucoma: Secondary | ICD-10-CM | POA: Diagnosis not present

## 2022-05-04 DIAGNOSIS — Z7401 Bed confinement status: Secondary | ICD-10-CM | POA: Diagnosis not present

## 2022-05-04 DIAGNOSIS — L0390 Cellulitis, unspecified: Secondary | ICD-10-CM | POA: Diagnosis not present

## 2022-05-04 DIAGNOSIS — M2550 Pain in unspecified joint: Secondary | ICD-10-CM | POA: Diagnosis not present

## 2022-05-04 DIAGNOSIS — R69 Illness, unspecified: Secondary | ICD-10-CM | POA: Diagnosis not present

## 2022-05-04 MED ORDER — POLYETHYLENE GLYCOL 3350 17 GM/SCOOP PO POWD
17.0000 g | Freq: Two times a day (BID) | ORAL | 0 refills | Status: DC
  Filled 2022-05-04: qty 238, 7d supply, fill #0

## 2022-05-04 MED ORDER — ONDANSETRON HCL 4 MG PO TABS
4.0000 mg | ORAL_TABLET | Freq: Four times a day (QID) | ORAL | 0 refills | Status: DC | PRN
  Filled 2022-05-04: qty 20, 5d supply, fill #0

## 2022-05-04 MED ORDER — MELOXICAM 7.5 MG PO TABS
7.5000 mg | ORAL_TABLET | Freq: Every day | ORAL | 2 refills | Status: DC
Start: 2022-05-04 — End: 2022-11-26
  Filled 2022-05-04: qty 30, 30d supply, fill #0
  Filled 2022-08-02: qty 30, 30d supply, fill #1
  Filled 2022-08-04: qty 30, 30d supply, fill #0

## 2022-05-04 MED ORDER — AMLODIPINE BESYLATE 10 MG PO TABS
5.0000 mg | ORAL_TABLET | Freq: Every day | ORAL | 2 refills | Status: DC
  Filled 2022-05-04: qty 30, 60d supply, fill #0

## 2022-05-04 MED ORDER — SENNOSIDES-DOCUSATE SODIUM 8.6-50 MG PO TABS: 2.0000 | ORAL_TABLET | Freq: Two times a day (BID) | ORAL | Status: DC

## 2022-05-04 NOTE — Progress Notes (Addendum)
Physical Therapy Treatment Patient Details Name: Melanie Carrillo MRN: [id number] DOB: 04/19/1935 Today's Date: 05/04/2022   History of Present Illness 87 y/o female presented to ED on 04/27/22 due to L hip pain following feeling a pop sound in her L hip.  CT showed a fx of the intramedullary rod of LLE. S/p on 04/28/22 repair of L subtrochanteric nonunion, removal of hardware and fixation of femerol neck. Marland Kitchen PMH includes: L IM nailing(5/23), GERD, interstitial lung disease and HTN.    PT Comments    Pt able to progress to step pivot transfer from bed <> chair with min guard assist and RW (~3 feet each direction via scooting R LE on floor to maintain WB status). Pt also completed L LE therex as instructed. Transport arrived at end of session as pt discharging.    Recommendations for follow up therapy are one component of a multi-disciplinary discharge planning process, led by the attending physician.  Recommendations may be updated based on patient status, additional functional criteria and insurance authorization.  Follow Up Recommendations  Skilled nursing-short term rehab (<3 hours/day)     Assistance Recommended at Discharge Frequent or constant Supervision/Assistance  Patient can return home with the following A little help with walking and/or transfers;A little help with bathing/dressing/bathroom;Assistance with cooking/housework;Assist for transportation;Help with stairs or ramp for entrance   Equipment Recommendations  None recommended by PT    Recommendations for Other Services       Precautions / Restrictions Precautions Precautions: Fall Restrictions Weight Bearing Restrictions: Yes LLE Weight Bearing: Touchdown weight bearing     Mobility  Bed Mobility Overal bed mobility: Needs Assistance Bed Mobility: Supine to Sit, Sit to Supine     Supine to sit: Supervision Sit to supine: Supervision   General bed mobility comments: supervision for safety. No assistance required     Transfers Overall transfer level: Needs assistance Equipment used: Rolling walker (2 wheels) Transfers: Bed to chair/wheelchair/BSC, Sit to/from Stand Sit to Stand: Min guard   Step pivot transfers: Min guard       General transfer comment: Pt performed step pivot transfer from bed to chair followed by chair to bed (3 feet each direction). Pt provided with cues throughout for sequencing and maintenance of WB status. Pt required increased time to scoot R LE along floor due to weakness. No LOB occurred.    Ambulation/Gait               General Gait Details: unable   Stairs             Wheelchair Mobility    Modified Rankin (Stroke Patients Only)       Balance Overall balance assessment: Needs assistance Sitting-balance support: No upper extremity supported Sitting balance-Leahy Scale: Good     Standing balance support: Bilateral upper extremity supported, Reliant on assistive device for balance, Single extremity supported Standing balance-Leahy Scale: Fair Standing balance comment: balance not challenged this session                            Cognition Arousal/Alertness: Awake/alert Behavior During Therapy: WFL for tasks assessed/performed Overall Cognitive Status: Difficult to assess                                 General Comments: Pt pleasant and cooperative. Pt following all commands appropriately but needs increased cues to maintain WB status.  Exercises General Exercises - Lower Extremity Quad Sets: Left, 10 reps, Supine Heel Slides: Left, 10 reps, Supine Hip ABduction/ADduction: Left, Supine (8 reps (gravity eliminated)) Straight Leg Raises: Left, 5 reps, Supine    General Comments General comments (skin integrity, edema, etc.): HR and SpO2 WNL on RA      Pertinent Vitals/Pain Pain Assessment Pain Assessment: 0-10 Pain Score: 2  Faces Pain Scale: Hurts little more Pain Location: LLE Pain Descriptors  / Indicators: Discomfort Pain Intervention(s): Monitored during session, Limited activity within patient's tolerance, Ice applied    Home Living                          Prior Function            PT Goals (current goals can now be found in the care plan section) Acute Rehab PT Goals Patient Stated Goal: to go home PT Goal Formulation: With patient Time For Goal Achievement: 05/13/22 Potential to Achieve Goals: Good Progress towards PT goals: Progressing toward goals    Frequency    Min 3X/week      PT Plan Current plan remains appropriate    Co-evaluation              AM-PAC PT "6 Clicks" Mobility   Outcome Measure  Help needed turning from your back to your side while in a flat bed without using bedrails?: A Little Help needed moving from lying on your back to sitting on the side of a flat bed without using bedrails?: A Little Help needed moving to and from a bed to a chair (including a wheelchair)?: A Little Help needed standing up from a chair using your arms (e.g., wheelchair or bedside chair)?: A Little Help needed to walk in hospital room?: A Lot Help needed climbing 3-5 steps with a railing? : Total 6 Click Score: 15    End of Session Equipment Utilized During Treatment: Gait belt Activity Tolerance: Patient tolerated treatment well;Patient limited by pain;Patient limited by fatigue Patient left: with call bell/phone within reach;in bed;with nursing/sitter in room Nurse Communication: Mobility status PT Visit Diagnosis: Unsteadiness on feet (R26.81);Muscle weakness (generalized) (M62.81);Difficulty in walking, not elsewhere classified (R26.2)     Time: 6073-7106 PT Time Calculation (min) (ACUTE ONLY): 18 min  Charges:  $Therapeutic Activity: 8-22 mins                     Donna Bernard, PT    [facility] 05/04/2022, 12:03 PM

## 2022-05-04 NOTE — TOC Transition Note (Signed)
Transition of Care [facility]) - CM/SW Discharge Note   Patient Details  Name: Melanie Carrillo MRN: [id number] Date of Birth: 29-Jun-1935  [facility] CM/SW Contact:  Coralee Pesa, [place] Phone Number: 05/04/2022, 10:46 AM   Clinical Narrative:    Pt to be transported to [facility] via [place]. Nurse to call report to [phone]. Rm #[id number]. RN please notify daughter when transport arrives.   Final next level of care: Skilled Nursing Facility Barriers to Discharge: Barriers Resolved   Patient Goals and CMS Choice     Choice offered to / list presented to : Patient  Discharge Placement              Patient chooses bed at: [facility] Patient to be transferred to facility by: [place] Name of family member notified: [name] Patient and family notified of of transfer: 05/04/22  Discharge Plan and Services   Discharge Planning Services: CM Consult                                 Social Determinants of Health (SDOH) Interventions     Readmission Risk Interventions     No data to display

## 2022-05-04 NOTE — Care Management Important Message (Signed)
Important Message  Patient Details  Name: Melanie Carrillo MRN: [id number] Date of Birth: [birth year]   Medicare Important Message Given:  Yes     Hannah Beat 05/04/2022, 3:34 PM

## 2022-05-04 NOTE — Plan of Care (Signed)

## 2022-05-04 NOTE — Plan of Care (Signed)
  Problem: Education: Goal: Knowledge of General Education information will improve Description: Including pain rating scale, medication(s)/side effects and non-pharmacologic comfort measures 05/04/2022 1021 by Roetta Sessions D, LPN Outcome: Adequate for Discharge 05/04/2022 0808 by Roetta Sessions D, LPN Outcome: Progressing   Problem: Health Behavior/Discharge Planning: Goal: Ability to manage health-related needs will improve Outcome: Adequate for Discharge   Problem: Clinical Measurements: Goal: Ability to maintain clinical measurements within normal limits will improve Outcome: Adequate for Discharge Goal: Will remain free from infection Outcome: Adequate for Discharge Goal: Diagnostic test results will improve Outcome: Adequate for Discharge Goal: Respiratory complications will improve Outcome: Adequate for Discharge Goal: Cardiovascular complication will be avoided Outcome: Adequate for Discharge   Problem: Activity: Goal: Risk for activity intolerance will decrease 05/04/2022 1021 by Roetta Sessions D, LPN Outcome: Adequate for Discharge 05/04/2022 0808 by Roetta Sessions D, LPN Outcome: Progressing   Problem: Nutrition: Goal: Adequate nutrition will be maintained Outcome: Adequate for Discharge   Problem: Coping: Goal: Level of anxiety will decrease Outcome: Adequate for Discharge   Problem: Elimination: Goal: Will not experience complications related to bowel motility Outcome: Adequate for Discharge Goal: Will not experience complications related to urinary retention Outcome: Adequate for Discharge   Problem: Pain Managment: Goal: General experience of comfort will improve 05/04/2022 1021 by Roetta Sessions D, LPN Outcome: Adequate for Discharge 05/04/2022 0808 by Roetta Sessions D, LPN Outcome: Progressing   Problem: Safety: Goal: Ability to remain free from injury will improve 05/04/2022 1021 by Roetta Sessions D, LPN Outcome: Adequate for Discharge 05/04/2022 0808 by  Roetta Sessions D, LPN Outcome: Progressing   Problem: Skin Integrity: Goal: Risk for impaired skin integrity will decrease 05/04/2022 1021 by Roetta Sessions D, LPN Outcome: Adequate for Discharge 05/04/2022 0808 by Harriet Pho, LPN Outcome: Progressing

## 2022-05-04 NOTE — Discharge Summary (Signed)
Physician Discharge Summary  ERCILIA BETTINGER HBZ:[id number] DOB: May 20, 1935 DOA: 04/27/2022  PCP: Gayland Curry, MD  Admit date: 04/27/2022 Discharge date: 05/04/2022  Admitted From: Home Disposition: Skilled nursing facility Recommendations for Outpatient Follow-up:  Follow up with PCP in 1-2 weeks Please obtain BMP/CBC in one week Please follow up with ortho  Home Health: None none Equipment/Devices: None  discharge Condition: Stable  CODE STATUS: Full code Diet recommendation: Cardiac Brief/Interim Summary: 87 year old female with ORIF and intramedullary rod in May 2023 for intertrochanteric fracture readmitted with fracture of the intramedullary rod at the level of the interlocking screw.   Discharge Diagnoses:  Principal Problem:   Loosening of intramedullary nail (HCC) Active Problems:   Interstitial lung disease ([place])   Essential hypertension   GERD (gastroesophageal reflux disease)   Hyperlipidemia, mixed   Obstructive sleep apnea  #1 fracture of the left intramedullary rod-status post hardware removal and revision fixation of left femur fracture.  Patient reports some pain at the surgical site otherwise she feels better.  Her vitamin D level is normal. On oxycodone for pain control Eliquis for DVT prophylaxis Continue bowel regime PT eval rec snf  disposition.   #2 history of essential hypertension -her blood pressure is very labile she was on Norvasc and Cozaar prior to admission. I have stopped Cozaar on discharge.  I have decreased Norvasc to 5 mg daily.  Hold Norvasc if her systolic blood pressure is less than 120.   #3 interstitial lung disease stable on room air-encourage incentive spirometer   #4 obstructive sleep apnea stable   #5 glaucoma continue eyedrops   #6 hyperlipidemia on statins   #7 GERD on Protonix   Estimated body mass index is 28.08 kg/m as calculated from the following:   Height as of this encounter: '5\' 4"'$  (1.626 m).   Weight as of this  encounter: 74.2 kg.  Discharge Instructions  Discharge Instructions     Diet - low sodium heart healthy   Complete by: As directed    Increase activity slowly   Complete by: As directed    No wound care   Complete by: As directed       Allergies as of 05/04/2022   No Known Allergies      Medication List     STOP taking these medications    alum & mag hydroxide-simeth 200-200-20 MG/5ML suspension Commonly known as: MAALOX/MYLANTA   docusate sodium 100 MG capsule Commonly known as: COLACE   enoxaparin 40 MG/0.4ML injection Commonly known as: LOVENOX   losartan 100 MG tablet Commonly known as: COZAAR   VITAMIN D PO       TAKE these medications    acetaminophen 325 MG tablet Commonly known as: TYLENOL Take 1-2 tablets (325-650 mg total) by mouth every 6 (six) hours as needed for mild pain (pain score 1-3 or temp > 100.5). What changed:  how much to take when to take this reasons to take this   amLODipine 10 MG tablet Commonly known as: NORVASC Take 0.5 tablets (5 mg total) by mouth daily. What changed: how much to take   B-12 PO Take 1 capsule by mouth daily.   brimonidine 0.2 % ophthalmic solution Commonly known as: ALPHAGAN Place 1 drop into both eyes 2 (two) times daily.   Eliquis 2.5 MG Tabs tablet Generic drug: apixaban Take 1 tablet (2.5 mg total) by mouth 2 (two) times daily.   feeding supplement Liqd Take 237 mLs by mouth 2 (two) times daily between meals.  What changed:  when to take this reasons to take this   latanoprost 0.005 % ophthalmic solution Commonly known as: XALATAN Place 1 drop into both eyes at bedtime.   lovastatin 40 MG tablet Commonly known as: MEVACOR Take 40 mg by mouth daily.   Melatonin 10 MG Tabs Take 20 mg by mouth at bedtime.   meloxicam 7.5 MG tablet Commonly known as: Mobic Take 1 tablet (7.5 mg total) by mouth daily.   montelukast 10 MG tablet Commonly known as: SINGULAIR Take 10 mg by mouth at  bedtime.   MUCINEX PO Take 1 tablet by mouth in the morning and at bedtime.   multivitamin with minerals tablet Take 1 tablet by mouth daily.   omeprazole 40 MG capsule Commonly known as: PRILOSEC Take 40 mg by mouth daily.   ondansetron 4 MG tablet Commonly known as: ZOFRAN Take 1 tablet (4 mg total) by mouth every 6 (six) hours as needed for nausea.   oxyCODONE 5 MG immediate release tablet Commonly known as: Oxy IR/ROXICODONE Take 1 tablet (5 mg total) by mouth every 6 (six) hours as needed for severe pain.   polyethylene glycol 17 g packet Commonly known as: MIRALAX / GLYCOLAX Take 17 g by mouth 2 (two) times daily.   senna-docusate 8.6-50 MG tablet Commonly known as: Senokot-S Take 2 tablets by mouth 2 (two) times daily.   sertraline 25 MG tablet Commonly known as: ZOLOFT Take 25 mg by mouth daily.   TUMS PO Take 1 tablet by mouth as needed (reflux).        Follow-up Information     Haddix, Thomasene Lot, MD. Schedule an appointment as soon as possible for a visit in 2 week(s).   Specialty: Orthopedic Surgery Why: for wound check and repeat x-rays Contact information: [place] [address] [phone]                No Known Allergies  Consultations: Ortho   Procedures/Studies: DG FEMUR PORT MIN 2 VIEWS LEFT  Result Date: 04/28/2022 CLINICAL DATA:  Left femoral fracture, ORIF EXAM: LEFT FEMUR PORTABLE 2 VIEWS COMPARISON:  None Available. FINDINGS: ORIF of a left subtrochanteric hip fracture is seen utilizing a long-stem intramedullary rod with proximal and distal interlocking screws. Normal alignment. No interval fracture. No dislocation. There is intra-articular gas and fluid within the left knee predominantly within the suprapatellar recess, as well as surrounding the left hip likely postsurgical in nature. IMPRESSION: Status post ORIF left subtrochanteric hip fracture. Normal alignment. Electronically Signed   By: Fidela Salisbury  M.D.   On: 04/28/2022 19:44   DG FEMUR MIN 2 VIEWS LEFT  Result Date: 04/28/2022 CLINICAL DATA:  Hardware revision EXAM: LEFT FEMUR 2 VIEWS COMPARISON:  12/26/2021, CT 04/27/2022 FINDINGS: Seventeen low resolution intraoperative spot views of the left femur. Total fluoroscopy time was 8 minutes 3 seconds, fluoroscopic dose of 71.32 mGy. Images were obtained during intraoperative removal of previous fixation hardware with subsequent placement of new intramedullary rod and fixating screws across subtrochanteric fracture. IMPRESSION: Intraoperative fluoroscopic assistance provided during hardware revision left femur Electronically Signed   By: Donavan Foil M.D.   On: 04/28/2022 19:09   DG C-Arm 1-60 Min-No Report  Result Date: 04/28/2022 Fluoroscopy was utilized by the requesting physician.  No radiographic interpretation.   DG C-Arm 1-60 Min-No Report  Result Date: 04/28/2022 Fluoroscopy was utilized by the requesting physician.  No radiographic interpretation.   DG C-Arm 1-60 Min-No Report  Result Date: 04/28/2022  Fluoroscopy was utilized by the requesting physician.  No radiographic interpretation.   CT Hip Left Wo Contrast  Result Date: 04/27/2022 CLINICAL DATA:  Left hip pain after recent fall. Left hip surgery for fracture 12/26/2021. EXAM: CT OF THE LEFT HIP WITHOUT CONTRAST TECHNIQUE: Multidetector CT imaging of the left hip was performed according to the standard protocol. Multiplanar CT image reconstructions were also generated. RADIATION DOSE REDUCTION: This exam was performed according to the departmental dose-optimization program which includes automated exposure control, adjustment of the mA and/or kV according to patient size and/or use of iterative reconstruction technique. COMPARISON:  Preoperative radiographs 12/25/2021. Intraoperative radiographs 12/26/2021. None recent. FINDINGS: Bones/Joint/Cartilage Status post long left femoral intramedullary nail fixation of a comminuted  subtrochanteric left femur fracture. The intramedullary nail is angulated medially by 21 degrees at the level of the proximal interconnecting screws and appears fractured. In addition, the more inferior interconnecting screw appears fractured. The entire length of the intramedullary nail is not imaged. Comminuted subtrochanteric left femur fracture extending into the intratrochanteric region shows increased displacement compared with the intraoperative images. It is uncertain as to whether this reflects delayed healing of the original injury with interval increased displacement or a new fracture. The lesser trochanter remains medially displaced. The femoral head is intact. Mild underlying left hip degenerative changes. There is a small to moderate left hip joint effusion without significant hip arthropathy. Ligaments Suboptimally assessed by CT. Muscles and Tendons No intramuscular hematoma or significant atrophy. Soft tissues Postsurgical changes lateral to the greater trochanter with granulation tissue or ill-defined fluid. No evidence of acute hematoma. Prominent diverticular changes are noted throughout the sigmoid colon. IMPRESSION: 1. Interval hardware failure status post ORIF of subtrochanteric left femur fracture. The intramedullary nail appears fractured and angulated at the level of the proximal interlocking screws, and the more inferior interlocking screw appears fractured. 2. Increased displacement of the comminuted subtrochanteric/intertrochanteric femur fracture compared with intraoperative radiographs of 4 months ago. Findings favor delayed fracture healing with interval increased displacement, although a new fracture is possible. No dislocation or fracture of the left hemipelvis. 3. Nonspecific postsurgical fluid collection lateral to the greater trochanter. No evidence of acute hematoma. Electronically Signed   By: Richardean Sale M.D.   On: 04/27/2022 15:03   (Echo, Carotid, EGD, Colonoscopy,  ERCP)    Subjective: Patient resting in bed not in any distress anxious to start therapy  Discharge Exam: Vitals:   05/04/22 0348 05/04/22 0948  BP: (!) 146/61 (!) 125/55  Pulse: 84 80  Resp: 16 18  Temp: 97.9 F (36.6 C) 98.3 F (36.8 C)  SpO2: 95% 95%   Vitals:   05/03/22 0752 05/03/22 1637 05/04/22 0348 05/04/22 0948  BP: (!) 161/64 (!) 162/62 (!) 146/61 (!) 125/55  Pulse: 96 87 84 80  Resp: '16 16 16 18  '$ Temp: 98.5 F (36.9 C) 98.4 F (36.9 C) 97.9 F (36.6 C) 98.3 F (36.8 C)  TempSrc: Oral Oral Oral   SpO2: 96% 96% 95% 95%  Weight:      Height:        General: Pt is alert, awake, not in acute distress Cardiovascular: RRR, S1/S2 +, no rubs, no gallops Respiratory: CTA bilaterally, no wheezing, no rhonchi Abdominal: Soft, NT, ND, bowel sounds + Extremities: no edema, no cyanosis left hip incision site covered with dressing clean dry intact    The results of significant diagnostics from this hospitalization (including imaging, microbiology, ancillary and laboratory) are listed below for reference.  Microbiology: Recent Results (from the past 240 hour(s))  Surgical pcr screen     Status: None   Collection Time: 04/28/22 10:45 AM   Specimen: Nasal Mucosa; Nasal Swab  Result Value Ref Range Status   MRSA, PCR NEGATIVE NEGATIVE Final   Staphylococcus aureus NEGATIVE NEGATIVE Final    Comment: (NOTE) The Xpert SA Assay (FDA approved for NASAL specimens in patients 32 years of age and older), is one component of a comprehensive surveillance program. It is not intended to diagnose infection nor to guide or monitor treatment. Performed at [facility], [place] [address], [place], [place] [address]      Labs: BNP (last 3 results) No results for input(s): "BNP" in the last 8760 hours. Basic Metabolic Panel: Recent Labs  Lab 04/27/22 2012 04/28/22 2218 04/29/22 0104 04/30/22 0102 05/02/22 1201  NA 138  --  138 138 131*  K 4.0  --  4.1 4.1 4.1   CL 107  --  108 104 101  CO2 24  --  '24 25 23  '$ GLUCOSE 119*  --  158* 116* 110*  BUN 10  --  6* 11 8  CREATININE 0.65 0.59 0.56 0.61 0.53  CALCIUM 10.4*  --  9.3 9.4 9.6   Liver Function Tests: Recent Labs  Lab 04/29/22 0104 05/02/22 1201  AST 26 31  ALT 13 26  ALKPHOS 46 80  BILITOT 0.6 1.1  PROT 6.0* 6.6  ALBUMIN 3.3* 3.1*   No results for input(s): "LIPASE", "AMYLASE" in the last 168 hours. No results for input(s): "AMMONIA" in the last 168 hours. CBC: Recent Labs  Lab 04/27/22 2012 04/28/22 2218 04/29/22 0104 04/30/22 0102 05/02/22 1201  WBC 6.8 8.5 6.6 7.1 10.5  HGB 13.6 10.7* 10.0* 8.9* 10.0*  HCT 42.1 32.1* 30.6* 26.4* 30.8*  MCV 103.2* 103.2* 102.3* 102.3* 103.4*  PLT 225 164 173 161 248   Cardiac Enzymes: No results for input(s): "CKTOTAL", "CKMB", "CKMBINDEX", "TROPONINI" in the last 168 hours. BNP: Invalid input(s): "POCBNP" CBG: No results for input(s): "GLUCAP" in the last 168 hours. D-Dimer No results for input(s): "DDIMER" in the last 72 hours. Hgb A1c No results for input(s): "HGBA1C" in the last 72 hours. Lipid Profile No results for input(s): "CHOL", "HDL", "LDLCALC", "TRIG", "CHOLHDL", "LDLDIRECT" in the last 72 hours. Thyroid function studies No results for input(s): "TSH", "T4TOTAL", "T3FREE", "THYROIDAB" in the last 72 hours.  Invalid input(s): "FREET3" Anemia work up No results for input(s): "VITAMINB12", "FOLATE", "FERRITIN", "TIBC", "IRON", "RETICCTPCT" in the last 72 hours. Urinalysis    Component Value Date/Time   COLORURINE YELLOW (A) 12/25/2021 1418   APPEARANCEUR CLEAR (A) 12/25/2021 1418   LABSPEC 1.019 12/25/2021 1418   PHURINE 5.0 12/25/2021 1418   GLUCOSEU 50 (A) 12/25/2021 1418   HGBUR SMALL (A) 12/25/2021 1418   BILIRUBINUR NEGATIVE 12/25/2021 1418   KETONESUR NEGATIVE 12/25/2021 1418   PROTEINUR 30 (A) 12/25/2021 1418   NITRITE NEGATIVE 12/25/2021 1418   LEUKOCYTESUR TRACE (A) 12/25/2021 1418   Sepsis  Labs Recent Labs  Lab 04/28/22 2218 04/29/22 0104 04/30/22 0102 05/02/22 1201  WBC 8.5 6.6 7.1 10.5   Microbiology Recent Results (from the past 240 hour(s))  Surgical pcr screen     Status: None   Collection Time: 04/28/22 10:45 AM   Specimen: Nasal Mucosa; Nasal Swab  Result Value Ref Range Status   MRSA, PCR NEGATIVE NEGATIVE Final   Staphylococcus aureus NEGATIVE NEGATIVE Final    Comment: (NOTE) The Xpert SA  Assay (FDA approved for NASAL specimens in patients 4 years of age and older), is one component of a comprehensive surveillance program. It is not intended to diagnose infection nor to guide or monitor treatment. Performed at [facility], [place] [address] [place], [place] [address]      Time coordinating discharge: 38 minutes  SIGNED:  Georgette Shell, MD  [facility] 05/04/2022, 10:06 AM

## 2022-05-06 DIAGNOSIS — G4700 Insomnia, unspecified: Secondary | ICD-10-CM | POA: Diagnosis not present

## 2022-05-06 DIAGNOSIS — I10 Essential (primary) hypertension: Secondary | ICD-10-CM | POA: Diagnosis not present

## 2022-05-06 DIAGNOSIS — T84199A Other mechanical complication of internal fixation device of unspecified bone of limb, initial encounter: Secondary | ICD-10-CM | POA: Diagnosis not present

## 2022-05-06 DIAGNOSIS — K219 Gastro-esophageal reflux disease without esophagitis: Secondary | ICD-10-CM | POA: Diagnosis not present

## 2022-05-06 DIAGNOSIS — E785 Hyperlipidemia, unspecified: Secondary | ICD-10-CM | POA: Diagnosis not present

## 2022-05-06 DIAGNOSIS — H409 Unspecified glaucoma: Secondary | ICD-10-CM | POA: Diagnosis not present

## 2022-05-06 DIAGNOSIS — G4733 Obstructive sleep apnea (adult) (pediatric): Secondary | ICD-10-CM | POA: Diagnosis not present

## 2022-05-06 DIAGNOSIS — J849 Interstitial pulmonary disease, unspecified: Secondary | ICD-10-CM | POA: Diagnosis not present

## 2022-05-11 DIAGNOSIS — K219 Gastro-esophageal reflux disease without esophagitis: Secondary | ICD-10-CM | POA: Diagnosis not present

## 2022-05-11 DIAGNOSIS — E785 Hyperlipidemia, unspecified: Secondary | ICD-10-CM | POA: Diagnosis not present

## 2022-05-11 DIAGNOSIS — H409 Unspecified glaucoma: Secondary | ICD-10-CM | POA: Diagnosis not present

## 2022-05-11 DIAGNOSIS — G4700 Insomnia, unspecified: Secondary | ICD-10-CM | POA: Diagnosis not present

## 2022-05-11 DIAGNOSIS — T84199A Other mechanical complication of internal fixation device of unspecified bone of limb, initial encounter: Secondary | ICD-10-CM | POA: Diagnosis not present

## 2022-05-11 DIAGNOSIS — J849 Interstitial pulmonary disease, unspecified: Secondary | ICD-10-CM | POA: Diagnosis not present

## 2022-05-11 DIAGNOSIS — I10 Essential (primary) hypertension: Secondary | ICD-10-CM | POA: Diagnosis not present

## 2022-05-18 DIAGNOSIS — T84199D Other mechanical complication of internal fixation device of unspecified bone of limb, subsequent encounter: Secondary | ICD-10-CM | POA: Diagnosis not present

## 2022-05-18 DIAGNOSIS — S72142D Displaced intertrochanteric fracture of left femur, subsequent encounter for closed fracture with routine healing: Secondary | ICD-10-CM | POA: Diagnosis not present

## 2022-05-20 DIAGNOSIS — L0390 Cellulitis, unspecified: Secondary | ICD-10-CM | POA: Diagnosis not present

## 2022-05-24 DIAGNOSIS — K219 Gastro-esophageal reflux disease without esophagitis: Secondary | ICD-10-CM | POA: Diagnosis not present

## 2022-05-24 DIAGNOSIS — T84199A Other mechanical complication of internal fixation device of unspecified bone of limb, initial encounter: Secondary | ICD-10-CM | POA: Diagnosis not present

## 2022-05-24 DIAGNOSIS — J849 Interstitial pulmonary disease, unspecified: Secondary | ICD-10-CM | POA: Diagnosis not present

## 2022-05-24 DIAGNOSIS — L0390 Cellulitis, unspecified: Secondary | ICD-10-CM | POA: Diagnosis not present

## 2022-05-24 DIAGNOSIS — Z79899 Other long term (current) drug therapy: Secondary | ICD-10-CM | POA: Diagnosis not present

## 2022-05-24 DIAGNOSIS — G4733 Obstructive sleep apnea (adult) (pediatric): Secondary | ICD-10-CM | POA: Diagnosis not present

## 2022-05-24 DIAGNOSIS — E785 Hyperlipidemia, unspecified: Secondary | ICD-10-CM | POA: Diagnosis not present

## 2022-05-24 DIAGNOSIS — I10 Essential (primary) hypertension: Secondary | ICD-10-CM | POA: Diagnosis not present

## 2022-05-25 DIAGNOSIS — Z7901 Long term (current) use of anticoagulants: Secondary | ICD-10-CM | POA: Diagnosis not present

## 2022-05-25 DIAGNOSIS — M869 Osteomyelitis, unspecified: Secondary | ICD-10-CM | POA: Diagnosis not present

## 2022-05-25 DIAGNOSIS — G4733 Obstructive sleep apnea (adult) (pediatric): Secondary | ICD-10-CM | POA: Diagnosis not present

## 2022-05-25 DIAGNOSIS — D649 Anemia, unspecified: Secondary | ICD-10-CM | POA: Diagnosis not present

## 2022-05-25 DIAGNOSIS — Z974 Presence of external hearing-aid: Secondary | ICD-10-CM | POA: Diagnosis not present

## 2022-05-25 DIAGNOSIS — H401130 Primary open-angle glaucoma, bilateral, stage unspecified: Secondary | ICD-10-CM | POA: Diagnosis not present

## 2022-05-25 DIAGNOSIS — Z9181 History of falling: Secondary | ICD-10-CM | POA: Diagnosis not present

## 2022-05-25 DIAGNOSIS — E785 Hyperlipidemia, unspecified: Secondary | ICD-10-CM | POA: Diagnosis not present

## 2022-05-25 DIAGNOSIS — Z791 Long term (current) use of non-steroidal anti-inflammatories (NSAID): Secondary | ICD-10-CM | POA: Diagnosis not present

## 2022-05-25 DIAGNOSIS — R69 Illness, unspecified: Secondary | ICD-10-CM | POA: Diagnosis not present

## 2022-05-25 DIAGNOSIS — M810 Age-related osteoporosis without current pathological fracture: Secondary | ICD-10-CM | POA: Diagnosis not present

## 2022-05-25 DIAGNOSIS — K219 Gastro-esophageal reflux disease without esophagitis: Secondary | ICD-10-CM | POA: Diagnosis not present

## 2022-05-25 DIAGNOSIS — K5900 Constipation, unspecified: Secondary | ICD-10-CM | POA: Diagnosis not present

## 2022-05-25 DIAGNOSIS — J849 Interstitial pulmonary disease, unspecified: Secondary | ICD-10-CM | POA: Diagnosis not present

## 2022-05-25 DIAGNOSIS — Z4789 Encounter for other orthopedic aftercare: Secondary | ICD-10-CM | POA: Diagnosis not present

## 2022-05-25 DIAGNOSIS — E871 Hypo-osmolality and hyponatremia: Secondary | ICD-10-CM | POA: Diagnosis not present

## 2022-05-25 DIAGNOSIS — Z993 Dependence on wheelchair: Secondary | ICD-10-CM | POA: Diagnosis not present

## 2022-05-25 DIAGNOSIS — I10 Essential (primary) hypertension: Secondary | ICD-10-CM | POA: Diagnosis not present

## 2022-05-25 DIAGNOSIS — T84195D Other mechanical complication of internal fixation device of left femur, subsequent encounter: Secondary | ICD-10-CM | POA: Diagnosis not present

## 2022-05-27 DIAGNOSIS — K219 Gastro-esophageal reflux disease without esophagitis: Secondary | ICD-10-CM | POA: Diagnosis not present

## 2022-05-27 DIAGNOSIS — J301 Allergic rhinitis due to pollen: Secondary | ICD-10-CM | POA: Diagnosis not present

## 2022-05-27 DIAGNOSIS — I10 Essential (primary) hypertension: Secondary | ICD-10-CM | POA: Diagnosis not present

## 2022-05-27 DIAGNOSIS — E785 Hyperlipidemia, unspecified: Secondary | ICD-10-CM | POA: Diagnosis not present

## 2022-05-27 DIAGNOSIS — Z23 Encounter for immunization: Secondary | ICD-10-CM | POA: Diagnosis not present

## 2022-05-27 DIAGNOSIS — R69 Illness, unspecified: Secondary | ICD-10-CM | POA: Diagnosis not present

## 2022-05-28 DIAGNOSIS — E785 Hyperlipidemia, unspecified: Secondary | ICD-10-CM | POA: Diagnosis not present

## 2022-05-28 DIAGNOSIS — J849 Interstitial pulmonary disease, unspecified: Secondary | ICD-10-CM | POA: Diagnosis not present

## 2022-05-28 DIAGNOSIS — Z974 Presence of external hearing-aid: Secondary | ICD-10-CM | POA: Diagnosis not present

## 2022-05-28 DIAGNOSIS — Z791 Long term (current) use of non-steroidal anti-inflammatories (NSAID): Secondary | ICD-10-CM | POA: Diagnosis not present

## 2022-05-28 DIAGNOSIS — H401130 Primary open-angle glaucoma, bilateral, stage unspecified: Secondary | ICD-10-CM | POA: Diagnosis not present

## 2022-05-28 DIAGNOSIS — Z7901 Long term (current) use of anticoagulants: Secondary | ICD-10-CM | POA: Diagnosis not present

## 2022-05-28 DIAGNOSIS — K219 Gastro-esophageal reflux disease without esophagitis: Secondary | ICD-10-CM | POA: Diagnosis not present

## 2022-05-28 DIAGNOSIS — T84195D Other mechanical complication of internal fixation device of left femur, subsequent encounter: Secondary | ICD-10-CM | POA: Diagnosis not present

## 2022-05-28 DIAGNOSIS — D649 Anemia, unspecified: Secondary | ICD-10-CM | POA: Diagnosis not present

## 2022-05-28 DIAGNOSIS — R69 Illness, unspecified: Secondary | ICD-10-CM | POA: Diagnosis not present

## 2022-05-28 DIAGNOSIS — E871 Hypo-osmolality and hyponatremia: Secondary | ICD-10-CM | POA: Diagnosis not present

## 2022-05-28 DIAGNOSIS — K5900 Constipation, unspecified: Secondary | ICD-10-CM | POA: Diagnosis not present

## 2022-05-28 DIAGNOSIS — M810 Age-related osteoporosis without current pathological fracture: Secondary | ICD-10-CM | POA: Diagnosis not present

## 2022-05-28 DIAGNOSIS — G4733 Obstructive sleep apnea (adult) (pediatric): Secondary | ICD-10-CM | POA: Diagnosis not present

## 2022-05-28 DIAGNOSIS — I10 Essential (primary) hypertension: Secondary | ICD-10-CM | POA: Diagnosis not present

## 2022-05-28 DIAGNOSIS — Z993 Dependence on wheelchair: Secondary | ICD-10-CM | POA: Diagnosis not present

## 2022-05-28 DIAGNOSIS — Z9181 History of falling: Secondary | ICD-10-CM | POA: Diagnosis not present

## 2022-06-01 DIAGNOSIS — Z974 Presence of external hearing-aid: Secondary | ICD-10-CM | POA: Diagnosis not present

## 2022-06-01 DIAGNOSIS — R69 Illness, unspecified: Secondary | ICD-10-CM | POA: Diagnosis not present

## 2022-06-01 DIAGNOSIS — Z791 Long term (current) use of non-steroidal anti-inflammatories (NSAID): Secondary | ICD-10-CM | POA: Diagnosis not present

## 2022-06-01 DIAGNOSIS — D649 Anemia, unspecified: Secondary | ICD-10-CM | POA: Diagnosis not present

## 2022-06-01 DIAGNOSIS — Z7901 Long term (current) use of anticoagulants: Secondary | ICD-10-CM | POA: Diagnosis not present

## 2022-06-01 DIAGNOSIS — G4733 Obstructive sleep apnea (adult) (pediatric): Secondary | ICD-10-CM | POA: Diagnosis not present

## 2022-06-01 DIAGNOSIS — Z9181 History of falling: Secondary | ICD-10-CM | POA: Diagnosis not present

## 2022-06-01 DIAGNOSIS — J849 Interstitial pulmonary disease, unspecified: Secondary | ICD-10-CM | POA: Diagnosis not present

## 2022-06-01 DIAGNOSIS — Z993 Dependence on wheelchair: Secondary | ICD-10-CM | POA: Diagnosis not present

## 2022-06-01 DIAGNOSIS — H401130 Primary open-angle glaucoma, bilateral, stage unspecified: Secondary | ICD-10-CM | POA: Diagnosis not present

## 2022-06-01 DIAGNOSIS — I10 Essential (primary) hypertension: Secondary | ICD-10-CM | POA: Diagnosis not present

## 2022-06-01 DIAGNOSIS — E785 Hyperlipidemia, unspecified: Secondary | ICD-10-CM | POA: Diagnosis not present

## 2022-06-01 DIAGNOSIS — K5900 Constipation, unspecified: Secondary | ICD-10-CM | POA: Diagnosis not present

## 2022-06-01 DIAGNOSIS — E871 Hypo-osmolality and hyponatremia: Secondary | ICD-10-CM | POA: Diagnosis not present

## 2022-06-01 DIAGNOSIS — T84195D Other mechanical complication of internal fixation device of left femur, subsequent encounter: Secondary | ICD-10-CM | POA: Diagnosis not present

## 2022-06-01 DIAGNOSIS — K219 Gastro-esophageal reflux disease without esophagitis: Secondary | ICD-10-CM | POA: Diagnosis not present

## 2022-06-01 DIAGNOSIS — M810 Age-related osteoporosis without current pathological fracture: Secondary | ICD-10-CM | POA: Diagnosis not present

## 2022-06-02 DIAGNOSIS — K219 Gastro-esophageal reflux disease without esophagitis: Secondary | ICD-10-CM | POA: Diagnosis not present

## 2022-06-02 DIAGNOSIS — E785 Hyperlipidemia, unspecified: Secondary | ICD-10-CM | POA: Diagnosis not present

## 2022-06-02 DIAGNOSIS — D649 Anemia, unspecified: Secondary | ICD-10-CM | POA: Diagnosis not present

## 2022-06-02 DIAGNOSIS — Z974 Presence of external hearing-aid: Secondary | ICD-10-CM | POA: Diagnosis not present

## 2022-06-02 DIAGNOSIS — Z9181 History of falling: Secondary | ICD-10-CM | POA: Diagnosis not present

## 2022-06-02 DIAGNOSIS — G4733 Obstructive sleep apnea (adult) (pediatric): Secondary | ICD-10-CM | POA: Diagnosis not present

## 2022-06-02 DIAGNOSIS — H401130 Primary open-angle glaucoma, bilateral, stage unspecified: Secondary | ICD-10-CM | POA: Diagnosis not present

## 2022-06-02 DIAGNOSIS — K5900 Constipation, unspecified: Secondary | ICD-10-CM | POA: Diagnosis not present

## 2022-06-02 DIAGNOSIS — Z993 Dependence on wheelchair: Secondary | ICD-10-CM | POA: Diagnosis not present

## 2022-06-02 DIAGNOSIS — I10 Essential (primary) hypertension: Secondary | ICD-10-CM | POA: Diagnosis not present

## 2022-06-02 DIAGNOSIS — J849 Interstitial pulmonary disease, unspecified: Secondary | ICD-10-CM | POA: Diagnosis not present

## 2022-06-02 DIAGNOSIS — Z7901 Long term (current) use of anticoagulants: Secondary | ICD-10-CM | POA: Diagnosis not present

## 2022-06-02 DIAGNOSIS — E871 Hypo-osmolality and hyponatremia: Secondary | ICD-10-CM | POA: Diagnosis not present

## 2022-06-02 DIAGNOSIS — Z791 Long term (current) use of non-steroidal anti-inflammatories (NSAID): Secondary | ICD-10-CM | POA: Diagnosis not present

## 2022-06-02 DIAGNOSIS — R69 Illness, unspecified: Secondary | ICD-10-CM | POA: Diagnosis not present

## 2022-06-02 DIAGNOSIS — T84195D Other mechanical complication of internal fixation device of left femur, subsequent encounter: Secondary | ICD-10-CM | POA: Diagnosis not present

## 2022-06-02 DIAGNOSIS — M810 Age-related osteoporosis without current pathological fracture: Secondary | ICD-10-CM | POA: Diagnosis not present

## 2022-06-03 DIAGNOSIS — M1990 Unspecified osteoarthritis, unspecified site: Secondary | ICD-10-CM | POA: Diagnosis not present

## 2022-06-03 DIAGNOSIS — K219 Gastro-esophageal reflux disease without esophagitis: Secondary | ICD-10-CM | POA: Diagnosis not present

## 2022-06-03 DIAGNOSIS — K08109 Complete loss of teeth, unspecified cause, unspecified class: Secondary | ICD-10-CM | POA: Diagnosis not present

## 2022-06-03 DIAGNOSIS — H40159 Residual stage of open-angle glaucoma, unspecified eye: Secondary | ICD-10-CM | POA: Diagnosis not present

## 2022-06-03 DIAGNOSIS — E785 Hyperlipidemia, unspecified: Secondary | ICD-10-CM | POA: Diagnosis not present

## 2022-06-03 DIAGNOSIS — J8410 Pulmonary fibrosis, unspecified: Secondary | ICD-10-CM | POA: Diagnosis not present

## 2022-06-03 DIAGNOSIS — R69 Illness, unspecified: Secondary | ICD-10-CM | POA: Diagnosis not present

## 2022-06-03 DIAGNOSIS — Z7409 Other reduced mobility: Secondary | ICD-10-CM | POA: Diagnosis not present

## 2022-06-03 DIAGNOSIS — J301 Allergic rhinitis due to pollen: Secondary | ICD-10-CM | POA: Diagnosis not present

## 2022-06-03 DIAGNOSIS — I10 Essential (primary) hypertension: Secondary | ICD-10-CM | POA: Diagnosis not present

## 2022-06-04 DIAGNOSIS — G4733 Obstructive sleep apnea (adult) (pediatric): Secondary | ICD-10-CM | POA: Diagnosis not present

## 2022-06-04 DIAGNOSIS — J849 Interstitial pulmonary disease, unspecified: Secondary | ICD-10-CM | POA: Diagnosis not present

## 2022-06-04 DIAGNOSIS — T84195D Other mechanical complication of internal fixation device of left femur, subsequent encounter: Secondary | ICD-10-CM | POA: Diagnosis not present

## 2022-06-04 DIAGNOSIS — M810 Age-related osteoporosis without current pathological fracture: Secondary | ICD-10-CM | POA: Diagnosis not present

## 2022-06-04 DIAGNOSIS — Z974 Presence of external hearing-aid: Secondary | ICD-10-CM | POA: Diagnosis not present

## 2022-06-04 DIAGNOSIS — Z7901 Long term (current) use of anticoagulants: Secondary | ICD-10-CM | POA: Diagnosis not present

## 2022-06-04 DIAGNOSIS — K219 Gastro-esophageal reflux disease without esophagitis: Secondary | ICD-10-CM | POA: Diagnosis not present

## 2022-06-04 DIAGNOSIS — E871 Hypo-osmolality and hyponatremia: Secondary | ICD-10-CM | POA: Diagnosis not present

## 2022-06-04 DIAGNOSIS — H401130 Primary open-angle glaucoma, bilateral, stage unspecified: Secondary | ICD-10-CM | POA: Diagnosis not present

## 2022-06-04 DIAGNOSIS — E785 Hyperlipidemia, unspecified: Secondary | ICD-10-CM | POA: Diagnosis not present

## 2022-06-04 DIAGNOSIS — I10 Essential (primary) hypertension: Secondary | ICD-10-CM | POA: Diagnosis not present

## 2022-06-04 DIAGNOSIS — Z993 Dependence on wheelchair: Secondary | ICD-10-CM | POA: Diagnosis not present

## 2022-06-04 DIAGNOSIS — Z791 Long term (current) use of non-steroidal anti-inflammatories (NSAID): Secondary | ICD-10-CM | POA: Diagnosis not present

## 2022-06-04 DIAGNOSIS — D649 Anemia, unspecified: Secondary | ICD-10-CM | POA: Diagnosis not present

## 2022-06-04 DIAGNOSIS — K5900 Constipation, unspecified: Secondary | ICD-10-CM | POA: Diagnosis not present

## 2022-06-04 DIAGNOSIS — Z9181 History of falling: Secondary | ICD-10-CM | POA: Diagnosis not present

## 2022-06-04 DIAGNOSIS — R69 Illness, unspecified: Secondary | ICD-10-CM | POA: Diagnosis not present

## 2022-06-08 DIAGNOSIS — S72142D Displaced intertrochanteric fracture of left femur, subsequent encounter for closed fracture with routine healing: Secondary | ICD-10-CM | POA: Diagnosis not present

## 2022-06-08 DIAGNOSIS — T84199D Other mechanical complication of internal fixation device of unspecified bone of limb, subsequent encounter: Secondary | ICD-10-CM | POA: Diagnosis not present

## 2022-06-09 DIAGNOSIS — Z993 Dependence on wheelchair: Secondary | ICD-10-CM | POA: Diagnosis not present

## 2022-06-09 DIAGNOSIS — E785 Hyperlipidemia, unspecified: Secondary | ICD-10-CM | POA: Diagnosis not present

## 2022-06-09 DIAGNOSIS — M810 Age-related osteoporosis without current pathological fracture: Secondary | ICD-10-CM | POA: Diagnosis not present

## 2022-06-09 DIAGNOSIS — G4733 Obstructive sleep apnea (adult) (pediatric): Secondary | ICD-10-CM | POA: Diagnosis not present

## 2022-06-09 DIAGNOSIS — K5900 Constipation, unspecified: Secondary | ICD-10-CM | POA: Diagnosis not present

## 2022-06-09 DIAGNOSIS — Z7901 Long term (current) use of anticoagulants: Secondary | ICD-10-CM | POA: Diagnosis not present

## 2022-06-09 DIAGNOSIS — T84195D Other mechanical complication of internal fixation device of left femur, subsequent encounter: Secondary | ICD-10-CM | POA: Diagnosis not present

## 2022-06-09 DIAGNOSIS — H401130 Primary open-angle glaucoma, bilateral, stage unspecified: Secondary | ICD-10-CM | POA: Diagnosis not present

## 2022-06-09 DIAGNOSIS — D649 Anemia, unspecified: Secondary | ICD-10-CM | POA: Diagnosis not present

## 2022-06-09 DIAGNOSIS — E871 Hypo-osmolality and hyponatremia: Secondary | ICD-10-CM | POA: Diagnosis not present

## 2022-06-09 DIAGNOSIS — J849 Interstitial pulmonary disease, unspecified: Secondary | ICD-10-CM | POA: Diagnosis not present

## 2022-06-09 DIAGNOSIS — R69 Illness, unspecified: Secondary | ICD-10-CM | POA: Diagnosis not present

## 2022-06-09 DIAGNOSIS — Z9181 History of falling: Secondary | ICD-10-CM | POA: Diagnosis not present

## 2022-06-09 DIAGNOSIS — I10 Essential (primary) hypertension: Secondary | ICD-10-CM | POA: Diagnosis not present

## 2022-06-09 DIAGNOSIS — Z791 Long term (current) use of non-steroidal anti-inflammatories (NSAID): Secondary | ICD-10-CM | POA: Diagnosis not present

## 2022-06-09 DIAGNOSIS — Z974 Presence of external hearing-aid: Secondary | ICD-10-CM | POA: Diagnosis not present

## 2022-06-09 DIAGNOSIS — K219 Gastro-esophageal reflux disease without esophagitis: Secondary | ICD-10-CM | POA: Diagnosis not present

## 2022-06-10 DIAGNOSIS — E785 Hyperlipidemia, unspecified: Secondary | ICD-10-CM | POA: Diagnosis not present

## 2022-06-10 DIAGNOSIS — Z791 Long term (current) use of non-steroidal anti-inflammatories (NSAID): Secondary | ICD-10-CM | POA: Diagnosis not present

## 2022-06-10 DIAGNOSIS — K5900 Constipation, unspecified: Secondary | ICD-10-CM | POA: Diagnosis not present

## 2022-06-10 DIAGNOSIS — J849 Interstitial pulmonary disease, unspecified: Secondary | ICD-10-CM | POA: Diagnosis not present

## 2022-06-10 DIAGNOSIS — Z974 Presence of external hearing-aid: Secondary | ICD-10-CM | POA: Diagnosis not present

## 2022-06-10 DIAGNOSIS — E871 Hypo-osmolality and hyponatremia: Secondary | ICD-10-CM | POA: Diagnosis not present

## 2022-06-10 DIAGNOSIS — R69 Illness, unspecified: Secondary | ICD-10-CM | POA: Diagnosis not present

## 2022-06-10 DIAGNOSIS — Z993 Dependence on wheelchair: Secondary | ICD-10-CM | POA: Diagnosis not present

## 2022-06-10 DIAGNOSIS — Z9181 History of falling: Secondary | ICD-10-CM | POA: Diagnosis not present

## 2022-06-10 DIAGNOSIS — I10 Essential (primary) hypertension: Secondary | ICD-10-CM | POA: Diagnosis not present

## 2022-06-10 DIAGNOSIS — H401130 Primary open-angle glaucoma, bilateral, stage unspecified: Secondary | ICD-10-CM | POA: Diagnosis not present

## 2022-06-10 DIAGNOSIS — M810 Age-related osteoporosis without current pathological fracture: Secondary | ICD-10-CM | POA: Diagnosis not present

## 2022-06-10 DIAGNOSIS — K219 Gastro-esophageal reflux disease without esophagitis: Secondary | ICD-10-CM | POA: Diagnosis not present

## 2022-06-10 DIAGNOSIS — T84195D Other mechanical complication of internal fixation device of left femur, subsequent encounter: Secondary | ICD-10-CM | POA: Diagnosis not present

## 2022-06-10 DIAGNOSIS — G4733 Obstructive sleep apnea (adult) (pediatric): Secondary | ICD-10-CM | POA: Diagnosis not present

## 2022-06-10 DIAGNOSIS — Z7901 Long term (current) use of anticoagulants: Secondary | ICD-10-CM | POA: Diagnosis not present

## 2022-06-10 DIAGNOSIS — D649 Anemia, unspecified: Secondary | ICD-10-CM | POA: Diagnosis not present

## 2022-06-11 DIAGNOSIS — R69 Illness, unspecified: Secondary | ICD-10-CM | POA: Diagnosis not present

## 2022-06-11 DIAGNOSIS — G4733 Obstructive sleep apnea (adult) (pediatric): Secondary | ICD-10-CM | POA: Diagnosis not present

## 2022-06-11 DIAGNOSIS — T84195D Other mechanical complication of internal fixation device of left femur, subsequent encounter: Secondary | ICD-10-CM | POA: Diagnosis not present

## 2022-06-11 DIAGNOSIS — D649 Anemia, unspecified: Secondary | ICD-10-CM | POA: Diagnosis not present

## 2022-06-11 DIAGNOSIS — I10 Essential (primary) hypertension: Secondary | ICD-10-CM | POA: Diagnosis not present

## 2022-06-11 DIAGNOSIS — K219 Gastro-esophageal reflux disease without esophagitis: Secondary | ICD-10-CM | POA: Diagnosis not present

## 2022-06-11 DIAGNOSIS — J849 Interstitial pulmonary disease, unspecified: Secondary | ICD-10-CM | POA: Diagnosis not present

## 2022-06-11 DIAGNOSIS — M6281 Muscle weakness (generalized): Secondary | ICD-10-CM | POA: Diagnosis not present

## 2022-06-11 DIAGNOSIS — E871 Hypo-osmolality and hyponatremia: Secondary | ICD-10-CM | POA: Diagnosis not present

## 2022-06-11 DIAGNOSIS — K5900 Constipation, unspecified: Secondary | ICD-10-CM | POA: Diagnosis not present

## 2022-06-11 DIAGNOSIS — E785 Hyperlipidemia, unspecified: Secondary | ICD-10-CM | POA: Diagnosis not present

## 2022-06-11 DIAGNOSIS — Z791 Long term (current) use of non-steroidal anti-inflammatories (NSAID): Secondary | ICD-10-CM | POA: Diagnosis not present

## 2022-06-11 DIAGNOSIS — Z7901 Long term (current) use of anticoagulants: Secondary | ICD-10-CM | POA: Diagnosis not present

## 2022-06-11 DIAGNOSIS — Z993 Dependence on wheelchair: Secondary | ICD-10-CM | POA: Diagnosis not present

## 2022-06-11 DIAGNOSIS — M810 Age-related osteoporosis without current pathological fracture: Secondary | ICD-10-CM | POA: Diagnosis not present

## 2022-06-11 DIAGNOSIS — Z974 Presence of external hearing-aid: Secondary | ICD-10-CM | POA: Diagnosis not present

## 2022-06-11 DIAGNOSIS — Z9181 History of falling: Secondary | ICD-10-CM | POA: Diagnosis not present

## 2022-06-11 DIAGNOSIS — H401130 Primary open-angle glaucoma, bilateral, stage unspecified: Secondary | ICD-10-CM | POA: Diagnosis not present

## 2022-06-15 DIAGNOSIS — T84195D Other mechanical complication of internal fixation device of left femur, subsequent encounter: Secondary | ICD-10-CM | POA: Diagnosis not present

## 2022-06-15 DIAGNOSIS — D649 Anemia, unspecified: Secondary | ICD-10-CM | POA: Diagnosis not present

## 2022-06-15 DIAGNOSIS — K5900 Constipation, unspecified: Secondary | ICD-10-CM | POA: Diagnosis not present

## 2022-06-15 DIAGNOSIS — Z791 Long term (current) use of non-steroidal anti-inflammatories (NSAID): Secondary | ICD-10-CM | POA: Diagnosis not present

## 2022-06-15 DIAGNOSIS — Z974 Presence of external hearing-aid: Secondary | ICD-10-CM | POA: Diagnosis not present

## 2022-06-15 DIAGNOSIS — K219 Gastro-esophageal reflux disease without esophagitis: Secondary | ICD-10-CM | POA: Diagnosis not present

## 2022-06-15 DIAGNOSIS — Z7901 Long term (current) use of anticoagulants: Secondary | ICD-10-CM | POA: Diagnosis not present

## 2022-06-15 DIAGNOSIS — G4733 Obstructive sleep apnea (adult) (pediatric): Secondary | ICD-10-CM | POA: Diagnosis not present

## 2022-06-15 DIAGNOSIS — J849 Interstitial pulmonary disease, unspecified: Secondary | ICD-10-CM | POA: Diagnosis not present

## 2022-06-15 DIAGNOSIS — I10 Essential (primary) hypertension: Secondary | ICD-10-CM | POA: Diagnosis not present

## 2022-06-15 DIAGNOSIS — Z9181 History of falling: Secondary | ICD-10-CM | POA: Diagnosis not present

## 2022-06-15 DIAGNOSIS — M810 Age-related osteoporosis without current pathological fracture: Secondary | ICD-10-CM | POA: Diagnosis not present

## 2022-06-15 DIAGNOSIS — E871 Hypo-osmolality and hyponatremia: Secondary | ICD-10-CM | POA: Diagnosis not present

## 2022-06-15 DIAGNOSIS — H401130 Primary open-angle glaucoma, bilateral, stage unspecified: Secondary | ICD-10-CM | POA: Diagnosis not present

## 2022-06-15 DIAGNOSIS — E785 Hyperlipidemia, unspecified: Secondary | ICD-10-CM | POA: Diagnosis not present

## 2022-06-15 DIAGNOSIS — R69 Illness, unspecified: Secondary | ICD-10-CM | POA: Diagnosis not present

## 2022-06-15 DIAGNOSIS — Z993 Dependence on wheelchair: Secondary | ICD-10-CM | POA: Diagnosis not present

## 2022-06-17 DIAGNOSIS — K5900 Constipation, unspecified: Secondary | ICD-10-CM | POA: Diagnosis not present

## 2022-06-17 DIAGNOSIS — Z791 Long term (current) use of non-steroidal anti-inflammatories (NSAID): Secondary | ICD-10-CM | POA: Diagnosis not present

## 2022-06-17 DIAGNOSIS — K219 Gastro-esophageal reflux disease without esophagitis: Secondary | ICD-10-CM | POA: Diagnosis not present

## 2022-06-17 DIAGNOSIS — Z1331 Encounter for screening for depression: Secondary | ICD-10-CM | POA: Diagnosis not present

## 2022-06-17 DIAGNOSIS — Z23 Encounter for immunization: Secondary | ICD-10-CM | POA: Diagnosis not present

## 2022-06-17 DIAGNOSIS — Z9181 History of falling: Secondary | ICD-10-CM | POA: Diagnosis not present

## 2022-06-17 DIAGNOSIS — E785 Hyperlipidemia, unspecified: Secondary | ICD-10-CM | POA: Diagnosis not present

## 2022-06-17 DIAGNOSIS — Z993 Dependence on wheelchair: Secondary | ICD-10-CM | POA: Diagnosis not present

## 2022-06-17 DIAGNOSIS — G4733 Obstructive sleep apnea (adult) (pediatric): Secondary | ICD-10-CM | POA: Diagnosis not present

## 2022-06-17 DIAGNOSIS — H401130 Primary open-angle glaucoma, bilateral, stage unspecified: Secondary | ICD-10-CM | POA: Diagnosis not present

## 2022-06-17 DIAGNOSIS — G4700 Insomnia, unspecified: Secondary | ICD-10-CM | POA: Diagnosis not present

## 2022-06-17 DIAGNOSIS — E871 Hypo-osmolality and hyponatremia: Secondary | ICD-10-CM | POA: Diagnosis not present

## 2022-06-17 DIAGNOSIS — D649 Anemia, unspecified: Secondary | ICD-10-CM | POA: Diagnosis not present

## 2022-06-17 DIAGNOSIS — Z974 Presence of external hearing-aid: Secondary | ICD-10-CM | POA: Diagnosis not present

## 2022-06-17 DIAGNOSIS — R69 Illness, unspecified: Secondary | ICD-10-CM | POA: Diagnosis not present

## 2022-06-17 DIAGNOSIS — Z7901 Long term (current) use of anticoagulants: Secondary | ICD-10-CM | POA: Diagnosis not present

## 2022-06-17 DIAGNOSIS — J849 Interstitial pulmonary disease, unspecified: Secondary | ICD-10-CM | POA: Diagnosis not present

## 2022-06-17 DIAGNOSIS — M810 Age-related osteoporosis without current pathological fracture: Secondary | ICD-10-CM | POA: Diagnosis not present

## 2022-06-17 DIAGNOSIS — T84195D Other mechanical complication of internal fixation device of left femur, subsequent encounter: Secondary | ICD-10-CM | POA: Diagnosis not present

## 2022-06-17 DIAGNOSIS — I10 Essential (primary) hypertension: Secondary | ICD-10-CM | POA: Diagnosis not present

## 2022-06-21 ENCOUNTER — Ambulatory Visit: Payer: Self-pay | Admitting: Student

## 2022-06-21 NOTE — H&P (Signed)
[facility] ([facility]) H&P  Patient ID: Melanie Carrillo MRN: [id number] DOB/AGE: 12/31/1934 87 y.o.  Reason for surgery: Left hip postoperative wound drainage  HPI: Melanie Carrillo is an 87 y.o. female with PMH significant for hypertension, GERD, interstitial lung disease presenting for surgery on left lower extremity.  Patient initially underwent cephalomedullary nailing of left subtrochanteric femur fracture by Dr. Sharlet Salina at [facility] in May 2023.  Patient was doing well postoperatively until she had a catastrophic failure of her implant.  Due to the complex nature of the complication, patient was transferred to [facility] for evaluation and treatment by a fellowship trained orthopedic traumatologist.  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 Doreatha Martin.  Following this repair, patient has progressed well up until last week when she developed a bullous type wound over one of her incisions.  Since then, this wound has opened up and started draining.  Initially the drainage was clear/pink but is now progressed to purulent drainage.  She has been on 5 days of doxycycline with no improvement in symptoms.  The drainage continues to increase.  She presents now for irrigation debridement of the left hip.  Denies any fevers or chills  Past Medical History:  Diagnosis Date   Colon cancer ([place])    Hypertension     Past Surgical History:  Procedure Laterality Date   FEMUR IM NAIL Left 04/28/2022   Procedure: REVISION FIXATION OF LEFT FEMUR FRACTURE;  Surgeon: Shona Needles, MD;  Location: [facility];  Service: Orthopedics;  Laterality: Left;   HARDWARE REMOVAL Left 04/28/2022   Procedure: HARDWARE REMOVAL;  Surgeon: Shona Needles, MD;  Location: [facility];  Service: Orthopedics;  Laterality: Left;   INTRAMEDULLARY (IM) NAIL INTERTROCHANTERIC Left 12/26/2021   Procedure: INTRAMEDULLARY (IM) NAIL INTERTROCHANTRIC;  Surgeon: Renee Harder, MD;  Location: [facility]  [facility];  Service: Orthopedics;  Laterality: Left;    No family history on file.  Social History:  reports that she has never smoked. She has never used smokeless tobacco. She reports that she does not drink alcohol and does not use drugs.  Allergies: No Known Allergies  Medications: I have reviewed the patient's current medications. Prior to Admission:  No medications prior to admission.    ROS: Constitutional: No fever or chills Vision: No changes in vision ENT: No difficulty swallowing CV: No chest pain Pulm: No SOB or wheezing GI: No nausea or vomiting GU: No urgency or inability to hold urine Skin: + Draining wound left hip Neurologic: No numbness or tingling Psychiatric: No depression or anxiety Heme: No bruising Allergic: No reaction to medications or food   Exam: There were no vitals taken for this visit. General: No acute distress Orientation: Alert and oriented x3 Mood and Affect: Mood and affect appropriate, pleasant and cooperative Gait: Antalgic gait Coordination and balance: Within normal limits  Left lower extremity: Wound to left lateral hip with purulent drainage.  Some surrounding redness at the distal portion of the wound.  All other incisions are stable.  Tenderness over the lateral thigh.  Tolerates gentle hip and knee motion.  Endorses sensation throughout extremity.  Neurovascularly intact.  Right lower extremity: Skin without lesions. No tenderness to palpation. Full painless ROM, full strength in each muscle group without evidence of instability.  Motor and sensory function at baseline.  Neurovascularly intact   Medical Decision Making: Data: Imaging: AP and lateral views of the left femur show intramedullary nail in place.  No  signs of any hardware failure or loosening.  Subtrochanteric fracture remains in appropriate alignment.  Labs: No results found for this or any previous visit (from the past 168 hour(s)).  Assessment/Plan: 87 year old female  status post repair of left subtrochanteric nonunion with failed fixation on 04/28/2022, presenting with continued postoperative wound drainage  Despite being on antibiotics, patient continues to experience drainage from the left hip which is now worsening and becoming purulent.  At this point, would recommend proceeding with irrigation debridement of the left hip, risk and benefits of been discussed with the patient and her daughter.  The patient agrees to proceed with surgery.  We will plan to admit the patient postoperatively for pain control and IV antibiotics.   Gwinda Passe PA-C [facility] [phone] (office) [email or web address]

## 2022-06-22 ENCOUNTER — Encounter (HOSPITAL_COMMUNITY): Payer: Self-pay | Admitting: Student

## 2022-06-22 NOTE — Anesthesia Preprocedure Evaluation (Signed)
Anesthesia Evaluation  Patient identified by MRN, date of birth, ID band Patient awake    Reviewed: Allergy & Precautions, NPO status , Patient's Chart, lab work & pertinent test results  Airway Mallampati: II  TM Distance: >3 FB Neck ROM: Full    Dental   Pulmonary sleep apnea ,    breath sounds clear to auscultation       Cardiovascular hypertension, Pt. on medications  Rhythm:Regular Rate:Normal     Neuro/Psych  Neuromuscular disease    GI/Hepatic Neg liver ROS, GERD  ,  Endo/Other  negative endocrine ROS  Renal/GU negative Renal ROS     Musculoskeletal   Abdominal   Peds  Hematology  (+) Blood dyscrasia, anemia ,   Anesthesia Other Findings   Reproductive/Obstetrics                            Anesthesia Physical Anesthesia Plan  ASA: 3  Anesthesia Plan: General   Post-op Pain Management: Tylenol PO (pre-op)* and Minimal or no pain anticipated   Induction: Intravenous  PONV Risk Score and Plan: 3 and Dexamethasone, Ondansetron and Treatment may vary due to age or medical condition  Airway Management Planned: Oral ETT  Additional Equipment: None  Intra-op Plan:   Post-operative Plan: Extubation in OR  Informed Consent: I have reviewed the patients History and Physical, chart, labs and discussed the procedure including the risks, benefits and alternatives for the proposed anesthesia with the patient or authorized representative who has indicated his/her understanding and acceptance.     Dental advisory given  Plan Discussed with: CRNA  Anesthesia Plan Comments: (TTE 09/24/21: INTERPRETATION  NORMAL LEFT VENTRICULAR SYSTOLIC FUNCTION  WITH MILD LVH  NORMAL RIGHT VENTRICULAR SYSTOLIC FUNCTION  NO VALVULAR STENOSIS  MILD AR, TR, PR  TRIVIAL MR  EF >55%   Nuclear stress 09/14/21: Normal Lexiscan infusion EKG  Normal myocardial perfusion without evidence of myocardial  ischemia   )       Anesthesia Quick Evaluation

## 2022-06-22 NOTE — Progress Notes (Addendum)
Instructions only given to patient and Daughter [name].  Reviewed medical history only, no surgical history review.  Medications were reviewed by pharmacy.  No changes in medications.  All questions and concerns were answered.  Patient to arrive at 6 AM.

## 2022-06-23 ENCOUNTER — Inpatient Hospital Stay (HOSPITAL_COMMUNITY): Payer: Medicare HMO

## 2022-06-23 ENCOUNTER — Inpatient Hospital Stay (HOSPITAL_COMMUNITY)
Admission: RE | Admit: 2022-06-23 | Discharge: 2022-06-25 | DRG: 463 | Disposition: A | Discharge status: Home Health | Payer: Medicare HMO | Attending: Student | Admitting: Student

## 2022-06-23 ENCOUNTER — Encounter (HOSPITAL_COMMUNITY)
Admission: RE | Disposition: A | Discharge status: Home Health | Payer: Self-pay | Source: Home / Self Care | Attending: Student

## 2022-06-23 ENCOUNTER — Inpatient Hospital Stay (HOSPITAL_COMMUNITY): Payer: Medicare HMO | Admitting: Physician Assistant

## 2022-06-23 ENCOUNTER — Encounter (HOSPITAL_COMMUNITY): Payer: Self-pay | Admitting: Student

## 2022-06-23 ENCOUNTER — Other Ambulatory Visit: Payer: Self-pay

## 2022-06-23 DIAGNOSIS — G4730 Sleep apnea, unspecified: Secondary | ICD-10-CM

## 2022-06-23 DIAGNOSIS — H409 Unspecified glaucoma: Secondary | ICD-10-CM | POA: Diagnosis not present

## 2022-06-23 DIAGNOSIS — M869 Osteomyelitis, unspecified: Secondary | ICD-10-CM | POA: Diagnosis not present

## 2022-06-23 DIAGNOSIS — Z85038 Personal history of other malignant neoplasm of large intestine: Secondary | ICD-10-CM | POA: Diagnosis not present

## 2022-06-23 DIAGNOSIS — D649 Anemia, unspecified: Secondary | ICD-10-CM | POA: Diagnosis not present

## 2022-06-23 DIAGNOSIS — E785 Hyperlipidemia, unspecified: Secondary | ICD-10-CM | POA: Diagnosis not present

## 2022-06-23 DIAGNOSIS — T8452XA Infection and inflammatory reaction due to internal left hip prosthesis, initial encounter: Principal | ICD-10-CM | POA: Diagnosis present

## 2022-06-23 DIAGNOSIS — D62 Acute posthemorrhagic anemia: Secondary | ICD-10-CM | POA: Diagnosis not present

## 2022-06-23 DIAGNOSIS — J849 Interstitial pulmonary disease, unspecified: Secondary | ICD-10-CM | POA: Diagnosis not present

## 2022-06-23 DIAGNOSIS — I10 Essential (primary) hypertension: Secondary | ICD-10-CM

## 2022-06-23 DIAGNOSIS — T8189XA Other complications of procedures, not elsewhere classified, initial encounter: Secondary | ICD-10-CM | POA: Diagnosis not present

## 2022-06-23 DIAGNOSIS — L089 Local infection of the skin and subcutaneous tissue, unspecified: Secondary | ICD-10-CM | POA: Diagnosis not present

## 2022-06-23 DIAGNOSIS — Y838 Other surgical procedures as the cause of abnormal reaction of the patient, or of later complication, without mention of misadventure at the time of the procedure: Secondary | ICD-10-CM | POA: Diagnosis present

## 2022-06-23 DIAGNOSIS — S72142A Displaced intertrochanteric fracture of left femur, initial encounter for closed fracture: Secondary | ICD-10-CM | POA: Diagnosis present

## 2022-06-23 DIAGNOSIS — Z79899 Other long term (current) drug therapy: Secondary | ICD-10-CM

## 2022-06-23 DIAGNOSIS — M009 Pyogenic arthritis, unspecified: Secondary | ICD-10-CM | POA: Diagnosis not present

## 2022-06-23 DIAGNOSIS — T148XXA Other injury of unspecified body region, initial encounter: Secondary | ICD-10-CM | POA: Diagnosis not present

## 2022-06-23 DIAGNOSIS — K219 Gastro-esophageal reflux disease without esophagitis: Secondary | ICD-10-CM | POA: Diagnosis present

## 2022-06-23 DIAGNOSIS — M8618 Other acute osteomyelitis, other site: Secondary | ICD-10-CM | POA: Diagnosis not present

## 2022-06-23 HISTORY — DX: Dyspnea, unspecified: R06.00

## 2022-06-23 HISTORY — DX: Constipation, unspecified: K59.00

## 2022-06-23 HISTORY — DX: Unspecified glaucoma: H40.9

## 2022-06-23 HISTORY — DX: Gastro-esophageal reflux disease without esophagitis: K21.9

## 2022-06-23 HISTORY — DX: Hyperlipidemia, unspecified: E78.5

## 2022-06-23 HISTORY — PX: I & D EXTREMITY: SHX5045

## 2022-06-23 LAB — BASIC METABOLIC PANEL
Anion gap: 9 (ref 5–15)
BUN: 10 mg/dL (ref 8–23)
CO2: 22 mmol/L (ref 22–32)
Calcium: 10 mg/dL (ref 8.9–10.3)
Chloride: 102 mmol/L (ref 98–111)
Creatinine, Ser: 0.62 mg/dL (ref 0.44–1.00)
GFR, Estimated: >60 mL/min (ref >60)
Glucose, Bld: 108 mg/dL — ABNORMAL HIGH (ref 70–99)
Potassium: 4.2 mmol/L (ref 3.5–5.1)
Sodium: 133 mmol/L — ABNORMAL LOW (ref 135–145)

## 2022-06-23 LAB — C-REACTIVE PROTEIN: CRP: 1.2 mg/dL — ABNORMAL HIGH (ref <1.0)

## 2022-06-23 LAB — CBC
HCT: 36.5 % (ref 36.0–46.0)
Hemoglobin: 11.4 g/dL — ABNORMAL LOW (ref 12.0–15.0)
MCH: 31.3 pg (ref 26.0–34.0)
MCHC: 31.2 g/dL (ref 30.0–36.0)
MCV: 100.3 fL — ABNORMAL HIGH (ref 80.0–100.0)
Platelets: 285 10*3/uL (ref 150–400)
RBC: 3.64 MIL/uL — ABNORMAL LOW (ref 3.87–5.11)
RDW: 14.6 % (ref 11.5–15.5)
WBC: 7.2 10*3/uL (ref 4.0–10.5)
nRBC: 0 % (ref 0.0–0.2)

## 2022-06-23 LAB — SEDIMENTATION RATE: Sed Rate: 59 mm/hr — ABNORMAL HIGH (ref 0–22)

## 2022-06-23 SURGERY — IRRIGATION AND DEBRIDEMENT EXTREMITY
Anesthesia: General | Site: Hip | Laterality: Left

## 2022-06-23 MED ORDER — LIDOCAINE 2% (20 MG/ML) 5 ML SYRINGE
INTRAMUSCULAR | Status: DC | PRN
  Administered 2022-06-23: 60 mg via INTRAVENOUS

## 2022-06-23 MED ORDER — ENSURE ENLIVE PO LIQD: 237.0000 mL | Freq: Every day | ORAL | Status: DC | PRN

## 2022-06-23 MED ORDER — BRIMONIDINE TARTRATE 0.2 % OP SOLN
1.0000 [drp] | Freq: Two times a day (BID) | OPHTHALMIC | Status: DC
  Administered 2022-06-23 – 2022-06-25 (×4): 1 [drp] via OPHTHALMIC
  Filled 2022-06-23 (×2): qty 5

## 2022-06-23 MED ORDER — PRAVASTATIN SODIUM 40 MG PO TABS
40.0000 mg | ORAL_TABLET | Freq: Every day | ORAL | Status: DC
  Administered 2022-06-23 – 2022-06-24 (×2): 40 mg via ORAL
  Filled 2022-06-23 (×2): qty 1

## 2022-06-23 MED ORDER — DEXAMETHASONE SODIUM PHOSPHATE 10 MG/ML IJ SOLN
INTRAMUSCULAR | Status: DC | PRN
  Administered 2022-06-23: 5 mg via INTRAVENOUS

## 2022-06-23 MED ORDER — VANCOMYCIN HCL 1000 MG IV SOLR
INTRAVENOUS | Status: DC | PRN
  Administered 2022-06-23: 1000 mg via TOPICAL

## 2022-06-23 MED ORDER — ACETAMINOPHEN 325 MG PO TABS
650.0000 mg | ORAL_TABLET | Freq: Four times a day (QID) | ORAL | Status: AC
  Administered 2022-06-23 – 2022-06-24 (×4): 650 mg via ORAL
  Filled 2022-06-23 (×4): qty 2

## 2022-06-23 MED ORDER — 0.9 % SODIUM CHLORIDE (POUR BTL) OPTIME
TOPICAL | Status: DC | PRN
  Administered 2022-06-23: 1000 mL

## 2022-06-23 MED ORDER — LOSARTAN POTASSIUM 50 MG PO TABS
100.0000 mg | ORAL_TABLET | Freq: Every day | ORAL | Status: DC
  Administered 2022-06-23 – 2022-06-25 (×3): 100 mg via ORAL
  Filled 2022-06-23 (×3): qty 2

## 2022-06-23 MED ORDER — ONDANSETRON HCL 4 MG/2ML IJ SOLN
INTRAMUSCULAR | Status: AC
  Filled 2022-06-23: qty 2

## 2022-06-23 MED ORDER — DEXAMETHASONE SODIUM PHOSPHATE 10 MG/ML IJ SOLN
INTRAMUSCULAR | Status: AC
  Filled 2022-06-23: qty 1

## 2022-06-23 MED ORDER — SUGAMMADEX SODIUM 200 MG/2ML IV SOLN
INTRAVENOUS | Status: DC | PRN
  Administered 2022-06-23: 100 mg via INTRAVENOUS

## 2022-06-23 MED ORDER — BISACODYL 5 MG PO TBEC
5.0000 mg | DELAYED_RELEASE_TABLET | Freq: Every morning | ORAL | Status: DC
  Administered 2022-06-24 – 2022-06-25 (×2): 5 mg via ORAL
  Filled 2022-06-23 (×2): qty 1

## 2022-06-23 MED ORDER — FENTANYL CITRATE (PF) 250 MCG/5ML IJ SOLN
INTRAMUSCULAR | Status: DC | PRN
  Administered 2022-06-23: 50 ug via INTRAVENOUS
  Administered 2022-06-23 (×2): 25 ug via INTRAVENOUS

## 2022-06-23 MED ORDER — EPHEDRINE 5 MG/ML INJ
INTRAVENOUS | Status: AC
  Filled 2022-06-23: qty 5

## 2022-06-23 MED ORDER — MORPHINE SULFATE (PF) 2 MG/ML IV SOLN: 0.5000 mg | INTRAVENOUS | Status: DC | PRN

## 2022-06-23 MED ORDER — MELATONIN 5 MG PO TABS
10.0000 mg | ORAL_TABLET | Freq: Every evening | ORAL | Status: DC | PRN
  Administered 2022-06-23 – 2022-06-24 (×2): 10 mg via ORAL
  Filled 2022-06-23 (×2): qty 2

## 2022-06-23 MED ORDER — FENTANYL CITRATE (PF) 100 MCG/2ML IJ SOLN
25.0000 ug | INTRAMUSCULAR | Status: DC | PRN
  Administered 2022-06-23: 50 ug via INTRAVENOUS
  Administered 2022-06-23: 25 ug via INTRAVENOUS

## 2022-06-23 MED ORDER — METOCLOPRAMIDE HCL 5 MG/ML IJ SOLN: 5.0000 mg | Freq: Three times a day (TID) | INTRAMUSCULAR | Status: DC | PRN

## 2022-06-23 MED ORDER — ONDANSETRON HCL 4 MG/2ML IJ SOLN
INTRAMUSCULAR | Status: DC | PRN
  Administered 2022-06-23: 4 mg via INTRAVENOUS

## 2022-06-23 MED ORDER — ROCURONIUM BROMIDE 10 MG/ML (PF) SYRINGE
PREFILLED_SYRINGE | INTRAVENOUS | Status: DC | PRN
  Administered 2022-06-23: 30 mg via INTRAVENOUS

## 2022-06-23 MED ORDER — VANCOMYCIN HCL 1000 MG IV SOLR
INTRAVENOUS | Status: AC
  Filled 2022-06-23: qty 20

## 2022-06-23 MED ORDER — SERTRALINE HCL 25 MG PO TABS
25.0000 mg | ORAL_TABLET | Freq: Every morning | ORAL | Status: DC
  Administered 2022-06-23 – 2022-06-25 (×3): 25 mg via ORAL
  Filled 2022-06-23 (×3): qty 1

## 2022-06-23 MED ORDER — MONTELUKAST SODIUM 10 MG PO TABS
10.0000 mg | ORAL_TABLET | Freq: Every day | ORAL | Status: DC
  Administered 2022-06-23 – 2022-06-24 (×2): 10 mg via ORAL
  Filled 2022-06-23 (×2): qty 1

## 2022-06-23 MED ORDER — SODIUM CHLORIDE 0.9 % IV SOLN
2.0000 g | INTRAVENOUS | Status: DC
  Administered 2022-06-23 – 2022-06-24 (×2): 2 g via INTRAVENOUS
  Filled 2022-06-23 (×2): qty 20

## 2022-06-23 MED ORDER — LACTATED RINGERS IV SOLN: INTRAVENOUS | Status: DC

## 2022-06-23 MED ORDER — VANCOMYCIN HCL IN DEXTROSE 1-5 GM/200ML-% IV SOLN
1000.0000 mg | INTRAVENOUS | Status: DC
  Filled 2022-06-23: qty 200

## 2022-06-23 MED ORDER — ASPIRIN 325 MG PO TABS
325.0000 mg | ORAL_TABLET | Freq: Every day | ORAL | Status: DC
  Administered 2022-06-24 – 2022-06-25 (×2): 325 mg via ORAL
  Filled 2022-06-23 (×2): qty 1

## 2022-06-23 MED ORDER — LATANOPROST 0.005 % OP SOLN
1.0000 [drp] | Freq: Every day | OPHTHALMIC | Status: DC
  Administered 2022-06-23 – 2022-06-24 (×2): 1 [drp] via OPHTHALMIC
  Filled 2022-06-23 (×2): qty 2.5

## 2022-06-23 MED ORDER — ACETAMINOPHEN 500 MG PO TABS
1000.0000 mg | ORAL_TABLET | Freq: Once | ORAL | Status: AC
  Administered 2022-06-23: 1000 mg via ORAL
  Filled 2022-06-23: qty 2

## 2022-06-23 MED ORDER — LIDOCAINE 2% (20 MG/ML) 5 ML SYRINGE
INTRAMUSCULAR | Status: AC
  Filled 2022-06-23: qty 5

## 2022-06-23 MED ORDER — TOBRAMYCIN SULFATE 1.2 G IJ SOLR
INTRAMUSCULAR | Status: AC
  Filled 2022-06-23: qty 1.2

## 2022-06-23 MED ORDER — CHLORHEXIDINE GLUCONATE 0.12 % MT SOLN
15.0000 mL | OROMUCOSAL | Status: AC
  Administered 2022-06-23: 15 mL via OROMUCOSAL
  Filled 2022-06-23 (×2): qty 15

## 2022-06-23 MED ORDER — PROPOFOL 10 MG/ML IV BOLUS
INTRAVENOUS | Status: DC | PRN
  Administered 2022-06-23: 30 mg via INTRAVENOUS
  Administered 2022-06-23: 100 mg via INTRAVENOUS

## 2022-06-23 MED ORDER — GUAIFENESIN ER 600 MG PO TB12
600.0000 mg | ORAL_TABLET | Freq: Two times a day (BID) | ORAL | Status: DC
  Administered 2022-06-23 – 2022-06-25 (×5): 600 mg via ORAL
  Filled 2022-06-23 (×5): qty 1

## 2022-06-23 MED ORDER — TOBRAMYCIN SULFATE 1.2 G IJ SOLR
INTRAMUSCULAR | Status: DC | PRN
  Administered 2022-06-23: 1.2 g via TOPICAL

## 2022-06-23 MED ORDER — AMLODIPINE BESYLATE 5 MG PO TABS
5.0000 mg | ORAL_TABLET | Freq: Every day | ORAL | Status: DC
  Administered 2022-06-23 – 2022-06-25 (×3): 5 mg via ORAL
  Filled 2022-06-23 (×3): qty 1

## 2022-06-23 MED ORDER — SODIUM CHLORIDE 0.9 % IV SOLN: INTRAVENOUS | Status: DC

## 2022-06-23 MED ORDER — VANCOMYCIN HCL 1250 MG/250ML IV SOLN
1250.0000 mg | Freq: Once | INTRAVENOUS | Status: AC
  Administered 2022-06-23: 1250 mg via INTRAVENOUS
  Filled 2022-06-23: qty 250

## 2022-06-23 MED ORDER — FENTANYL CITRATE (PF) 100 MCG/2ML IJ SOLN
INTRAMUSCULAR | Status: AC
  Filled 2022-06-23: qty 2

## 2022-06-23 MED ORDER — PHENYLEPHRINE 80 MCG/ML (10ML) SYRINGE FOR IV PUSH (FOR BLOOD PRESSURE SUPPORT)
PREFILLED_SYRINGE | INTRAVENOUS | Status: DC | PRN
  Administered 2022-06-23: 40 ug via INTRAVENOUS

## 2022-06-23 MED ORDER — PROPOFOL 10 MG/ML IV BOLUS
INTRAVENOUS | Status: AC
  Filled 2022-06-23: qty 20

## 2022-06-23 MED ORDER — ONDANSETRON HCL 4 MG PO TABS: 4.0000 mg | ORAL_TABLET | Freq: Four times a day (QID) | ORAL | Status: DC | PRN

## 2022-06-23 MED ORDER — ROCURONIUM BROMIDE 10 MG/ML (PF) SYRINGE
PREFILLED_SYRINGE | INTRAVENOUS | Status: AC
  Filled 2022-06-23: qty 10

## 2022-06-23 MED ORDER — HYDRALAZINE HCL 10 MG PO TABS: 10.0000 mg | ORAL_TABLET | Freq: Four times a day (QID) | ORAL | Status: DC | PRN

## 2022-06-23 MED ORDER — FENTANYL CITRATE (PF) 250 MCG/5ML IJ SOLN
INTRAMUSCULAR | Status: AC
  Filled 2022-06-23: qty 5

## 2022-06-23 MED ORDER — PANTOPRAZOLE SODIUM 40 MG PO TBEC
80.0000 mg | DELAYED_RELEASE_TABLET | Freq: Every day | ORAL | Status: DC
  Administered 2022-06-23 – 2022-06-25 (×3): 80 mg via ORAL
  Filled 2022-06-23 (×3): qty 2

## 2022-06-23 MED ORDER — MELOXICAM 7.5 MG PO TABS
7.5000 mg | ORAL_TABLET | Freq: Every day | ORAL | Status: DC
  Administered 2022-06-23 – 2022-06-25 (×3): 7.5 mg via ORAL
  Filled 2022-06-23 (×3): qty 1

## 2022-06-23 MED ORDER — AMISULPRIDE (ANTIEMETIC) 5 MG/2ML IV SOLN: 10.0000 mg | Freq: Once | INTRAVENOUS | Status: DC | PRN

## 2022-06-23 MED ORDER — POLYETHYLENE GLYCOL 3350 17 G PO PACK
17.0000 g | PACK | Freq: Every day | ORAL | Status: DC | PRN
  Administered 2022-06-24: 17 g via ORAL
  Filled 2022-06-23: qty 1

## 2022-06-23 MED ORDER — METOCLOPRAMIDE HCL 5 MG PO TABS: 5.0000 mg | ORAL_TABLET | Freq: Three times a day (TID) | ORAL | Status: DC | PRN

## 2022-06-23 MED ORDER — EPHEDRINE SULFATE-NACL 50-0.9 MG/10ML-% IV SOSY
PREFILLED_SYRINGE | INTRAVENOUS | Status: DC | PRN
  Administered 2022-06-23: 5 mg via INTRAVENOUS

## 2022-06-23 MED ORDER — ONDANSETRON HCL 4 MG/2ML IJ SOLN: 4.0000 mg | Freq: Four times a day (QID) | INTRAMUSCULAR | Status: DC | PRN

## 2022-06-23 MED ORDER — OXYCODONE HCL 5 MG PO TABS
5.0000 mg | ORAL_TABLET | Freq: Four times a day (QID) | ORAL | Status: DC | PRN
  Administered 2022-06-23 – 2022-06-25 (×4): 5 mg via ORAL
  Filled 2022-06-23 (×4): qty 1

## 2022-06-23 MED ORDER — SODIUM CHLORIDE 0.9 % IR SOLN
Status: DC | PRN
  Administered 2022-06-23: 3000 mL

## 2022-06-23 SURGICAL SUPPLY — 42 items
BAG COUNTER SPONGE SURGICOUNT (BAG) ×1 IMPLANT
BNDG COHESIVE 4X5 TAN STRL (GAUZE/BANDAGES/DRESSINGS) ×1 IMPLANT
BNDG GAUZE DERMACEA FLUFF 4 (GAUZE/BANDAGES/DRESSINGS) ×2 IMPLANT
BRUSH SCRUB EZ PLAIN DRY (MISCELLANEOUS) ×2 IMPLANT
CHLORAPREP W/TINT 26 (MISCELLANEOUS) ×1 IMPLANT
COVER MAYO STAND STRL (DRAPES) ×1 IMPLANT
COVER SURGICAL LIGHT HANDLE (MISCELLANEOUS) ×2 IMPLANT
DRAPE ORTHO SPLIT 77X108 STRL (DRAPES) ×1
DRAPE SURG 17X23 STRL (DRAPES) ×1 IMPLANT
DRAPE SURG ORHT 6 SPLT 77X108 (DRAPES) ×1 IMPLANT
DRAPE U-SHAPE 47X51 STRL (DRAPES) ×1 IMPLANT
DRSG ADAPTIC 3X8 NADH LF (GAUZE/BANDAGES/DRESSINGS) ×1 IMPLANT
DRSG MEPILEX POST OP 4X8 (GAUZE/BANDAGES/DRESSINGS) IMPLANT
ELECT REM PT RETURN 9FT ADLT (ELECTROSURGICAL) ×1
ELECTRODE REM PT RTRN 9FT ADLT (ELECTROSURGICAL) IMPLANT
GAUZE SPONGE 4X4 12PLY STRL (GAUZE/BANDAGES/DRESSINGS) ×1 IMPLANT
GLOVE BIO SURGEON STRL SZ 6.5 (GLOVE) ×3 IMPLANT
GLOVE BIO SURGEON STRL SZ7.5 (GLOVE) ×4 IMPLANT
GLOVE BIOGEL PI IND STRL 6.5 (GLOVE) ×1 IMPLANT
GLOVE BIOGEL PI IND STRL 7.5 (GLOVE) ×1 IMPLANT
GOWN STRL REUS W/ TWL LRG LVL3 (GOWN DISPOSABLE) ×2 IMPLANT
GOWN STRL REUS W/TWL LRG LVL3 (GOWN DISPOSABLE) ×2
HANDPIECE INTERPULSE COAX TIP (DISPOSABLE) ×1
KIT BASIN OR (CUSTOM PROCEDURE TRAY) ×1 IMPLANT
KIT TURNOVER KIT B (KITS) ×1 IMPLANT
MANIFOLD NEPTUNE II (INSTRUMENTS) ×1 IMPLANT
NS IRRIG 1000ML POUR BTL (IV SOLUTION) ×1 IMPLANT
PACK ORTHO EXTREMITY (CUSTOM PROCEDURE TRAY) ×1 IMPLANT
PAD ARMBOARD 7.5X6 YLW CONV (MISCELLANEOUS) ×2 IMPLANT
PADDING CAST COTTON 6X4 STRL (CAST SUPPLIES) ×1 IMPLANT
SET HNDPC FAN SPRY TIP SCT (DISPOSABLE) IMPLANT
SPONGE T-LAP 18X18 ~~LOC~~+RFID (SPONGE) ×1 IMPLANT
SUT ETHILON 2 0 FS 18 (SUTURE) ×2 IMPLANT
SUT ETHILON 3 0 PS 1 (SUTURE) ×2 IMPLANT
SUT MON AB 2-0 CT1 36 (SUTURE) ×1 IMPLANT
SWAB CULTURE ESWAB REG 1ML (MISCELLANEOUS) IMPLANT
TOWEL GREEN STERILE (TOWEL DISPOSABLE) ×2 IMPLANT
TOWEL GREEN STERILE FF (TOWEL DISPOSABLE) ×1 IMPLANT
TUBE CONNECTING 12X1/4 (SUCTIONS) ×1 IMPLANT
UNDERPAD 30X36 HEAVY ABSORB (UNDERPADS AND DIAPERS) ×1 IMPLANT
WATER STERILE IRR 1000ML POUR (IV SOLUTION) ×1 IMPLANT
YANKAUER SUCT BULB TIP NO VENT (SUCTIONS) ×1 IMPLANT

## 2022-06-23 NOTE — Transfer of Care (Signed)
Immediate Anesthesia Transfer of Care Note  Patient: Melanie Carrillo  Procedure(s) Performed: IRRIGATION AND DEBRIDEMENT LEFT HIP (Left: Hip)  Patient Location: PACU  Anesthesia Type:General  Level of Consciousness: awake, alert , oriented, patient cooperative, and responds to stimulation  Airway & Oxygen Therapy: Patient Spontanous Breathing and Patient connected to face mask oxygen  Post-op Assessment: Report given to RN, Post -op Vital signs reviewed and stable, and Patient moving all extremities X 4  Post vital signs: Reviewed and stable  Last Vitals:  Vitals Value Taken Time  BP    Temp    Pulse 104 06/23/22 0938  Resp 20 06/23/22 0938  SpO2 96 % 06/23/22 0938  Vitals shown include unvalidated device data.  Last Pain:  Vitals:   06/23/22 0640  TempSrc:   PainSc: 3       Patients Stated Pain Goal: 0 (72/15/87 2761)  Complications: No notable events documented.

## 2022-06-23 NOTE — Progress Notes (Signed)
Pharmacy Antibiotic Note  Melanie Carrillo is a 87 y.o. female admitted on 06/23/2022 with  left hip wound infeciton. S/p I&D this morning and revision of femur fracture. Last hip surgery 04/28/22 .  Pharmacy has been consulted for Vancomycin dosing.    Operative cultures sent. Noted vanc and tobramycin powders placed in OR  Plan: Vancomycin 1250 mg IV x 1 today. Vancomycin 1gm IV q24hrs to begin on 11/2. Goal AUC 400-550. Expected AUC: 504 using SCr 0.8 Also on Ceftriaxone 2gm IV q24h. Follow renal function, culture data, clinical progress and antibiotic plans. On daily meloxicam and losartan as PTA.   Height: '5\' 3"'$  (160 cm) Weight: 69.4 kg (153 lb) IBW/kg (Calculated) : 52.4  Temp (24hrs), Avg:97.7 F (36.5 C), Min:97.5 F (36.4 C), Max:97.9 F (36.6 C)  Recent Labs  Lab 06/23/22 0617  WBC 7.2  CREATININE 0.62    Estimated Creatinine Clearance: 48 mL/min (by C-G formula based on SCr of 0.62 mg/dL).    No Known Allergies  Antimicrobials this admission: Vancomycin 11/1 >> Ceftriaxone 11/1>> Tobramcyin  and Vancomcyin powders in OR 11/1  Dose adjustments this admission: n/a  Microbiology results: 11/1 deep wound/abscess (surgical cultures) x 2: sent  Thank you for allowing pharmacy to be a part of this patient's care.  Arty Baumgartner, [place] 06/23/2022 11:48 AM

## 2022-06-23 NOTE — Interval H&P Note (Signed)
History and Physical Interval Note:  06/23/2022 8:18 AM  Melanie Carrillo  has presented today for surgery, with the diagnosis of Left hip infection.  The various methods of treatment have been discussed with the patient and family. After consideration of risks, benefits and other options for treatment, the patient has consented to  Procedure(s): IRRIGATION AND DEBRIDEMENT LEFT HIP (Left) as a surgical intervention.  The patient's history has been reviewed, patient examined, no change in status, stable for surgery.  I have reviewed the patient's chart and labs.  Questions were answered to the patient's satisfaction.     Lennette Bihari P [place] Beem

## 2022-06-23 NOTE — Anesthesia Postprocedure Evaluation (Signed)
Anesthesia Post Note  Patient: Melanie Carrillo  Procedure(s) Performed: IRRIGATION AND DEBRIDEMENT LEFT HIP (Left: Hip)     Patient location during evaluation: PACU Anesthesia Type: General Level of consciousness: awake and alert Pain management: pain level controlled Vital Signs Assessment: post-procedure vital signs reviewed and stable Respiratory status: spontaneous breathing, nonlabored ventilation, respiratory function stable and patient connected to nasal cannula oxygen Cardiovascular status: blood pressure returned to baseline and stable Postop Assessment: no apparent nausea or vomiting Anesthetic complications: no   No notable events documented.  Last Vitals:  Vitals:   06/23/22 1005 06/23/22 1015  BP: (!) 140/60 139/61  Pulse: 90 86  Resp: 15 20  Temp:  36.6 C  SpO2: 97% 95%    Last Pain:  Vitals:   06/23/22 1452  TempSrc:   PainSc: 5                  Tiajuana Amass

## 2022-06-23 NOTE — Evaluation (Signed)
Physical Therapy Evaluation Patient Details Name: Melanie Carrillo MRN: [id number] DOB: 09-20-1934 Today's Date: 06/23/2022  History of Present Illness  87 yo presented 10/31 for surgery, with the diagnosis of Left hip infection. 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 S/p I&D left hip 11/01  PMH significant for hypertension, GERD, interstitial lung disease   Clinical Impression  Pt presents with condition above and deficits mentioned below, see PT Problem List. Prior to 04/28/22, she was mod I with a cane, but most recently she has been mod I with a RW for mobility. She lives with her daughter in a 1-level house with ramp entrance option. Currently, pt demonstrates deficits in bil lower extremity strength (weakest at the hips), balance, and activity tolerance. She is also limited by her L hip pain. She was able to perform all functional mobility, including ambulating up to ~150 ft with a RW at a min guard assist level without LOB. She would benefit from further acute PT services and follow-up with HHPT to maximize her return to baseline and address her deficits.       Recommendations for follow up therapy are one component of a multi-disciplinary discharge planning process, led by the attending physician.  Recommendations may be updated based on patient status, additional functional criteria and insurance authorization.  Follow Up Recommendations Home health PT      Assistance Recommended at Discharge Intermittent Supervision/Assistance  Patient can return home with the following  A little help with bathing/dressing/bathroom;Assistance with cooking/housework;Help with stairs or ramp for entrance;Assist for transportation;A little help with walking and/or transfers    Equipment Recommendations None recommended by PT  Recommendations for Other Services       Functional Status Assessment Patient has had a recent decline in their  functional status and demonstrates the ability to make significant improvements in function in a reasonable and predictable amount of time.     Precautions / Restrictions Precautions Precautions: Fall Restrictions Weight Bearing Restrictions: Yes LLE Weight Bearing: Weight bearing as tolerated      Mobility  Bed Mobility Overal bed mobility: Needs Assistance Bed Mobility: Supine to Sit, Sit to Supine     Supine to sit: Supervision, HOB elevated Sit to supine: Supervision, HOB elevated   General bed mobility comments: Supervision for safety, able to complete with slightly extra time and HOB elevated    Transfers Overall transfer level: Needs assistance Equipment used: Rolling walker (2 wheels) Transfers: Sit to/from Stand Sit to Stand: Min guard           General transfer comment: Min guard to come to stand 2x from EOB and 1x from toilet, needing extra time to set-up and gain momentum intermittently, no LOB    Ambulation/Gait Ambulation/Gait assistance: Min guard Gait Distance (Feet): 150 Feet (x2 bouts of ~150 ft > ~20 ft) Assistive device: Rolling walker (2 wheels) Gait Pattern/deviations: Step-to pattern, Step-through pattern, Decreased stance time - left, Decreased weight shift to left, Decreased stride length, Decreased step length - right, Antalgic Gait velocity: reduced Gait velocity interpretation: <1.31 ft/sec, indicative of household ambulator   General Gait Details: Pt with antalgic gait pattern, initially displaying step-to gait pattern, but progressing to step-through when cued to increase R step length. No LOB, min guard for safety  Stairs            Wheelchair Mobility    Modified Rankin (Stroke Patients Only)       Balance Overall balance  assessment: Needs assistance Sitting-balance support: No upper extremity supported, Feet supported Sitting balance-Leahy Scale: Good     Standing balance support: Bilateral upper extremity supported,  During functional activity, Reliant on assistive device for balance Standing balance-Leahy Scale: Poor Standing balance comment: Reliant on RW                             Pertinent Vitals/Pain Pain Assessment Pain Assessment: Faces Faces Pain Scale: Hurts little more Pain Location: L hip Pain Descriptors / Indicators: Discomfort, Operative site guarding, Grimacing, Sore Pain Intervention(s): Limited activity within patient's tolerance, Monitored during session, Premedicated before session, Repositioned, Ice applied    Home Living Family/patient expects to be discharged to:: Private residence Living Arrangements: Children Available Help at Discharge: Family;Available 24 hours/day (pretty close to 24/7) Type of Home: House Home Access: Stairs to enter;Ramped entrance (ramp option) Entrance Stairs-Rails: None Entrance Stairs-Number of Steps: 4   Home Layout: One level Home Equipment: Conservation officer, nature (2 wheels);Rollator (4 wheels);Cane - single point;BSC/3in1;Wheelchair - manual;Hospital bed;Other (comment);Adaptive equipment (lift chair)      Prior Function Prior Level of Function : Needs assist             Mobility Comments: Mod I-has been recently using the RW, but was using a cane before 04/28/22 ADLs Comments: Daughter does apply lotion on lower half but pt can use extended sponge for reaching with bathing and reports she is capable of dressing herself; daughter provides transportation     Hand Dominance   Dominant Hand: Right    Extremity/Trunk Assessment   Upper Extremity Assessment Upper Extremity Assessment: Defer to OT evaluation    Lower Extremity Assessment Lower Extremity Assessment: RLE deficits/detail;LLE deficits/detail RLE Deficits / Details: MMT scores of 3+ hip flexion, 4 knee extension, 4+ ankle dorsiflexion; denied numbness/tingling bil RLE Sensation: WNL LLE Deficits / Details: MMT scores of 3+ hip flexion, 4- knee extension, 4+ ankle  dorsiflexion; denied numbness/tingling bil; erythema noted around surgical site at hip LLE Sensation: WNL    Cervical / Trunk Assessment Cervical / Trunk Assessment: Kyphotic  Communication   Communication: HOH  Cognition Arousal/Alertness: Awake/alert Behavior During Therapy: WFL for tasks assessed/performed Overall Cognitive Status: Within Functional Limits for tasks assessed                                 General Comments: Follows commands appropriately        General Comments      Exercises     Assessment/Plan    PT Assessment Patient needs continued PT services  PT Problem List Decreased strength;Decreased range of motion;Decreased activity tolerance;Decreased balance;Decreased mobility;Pain       PT Treatment Interventions DME instruction;Gait training;Stair training;Functional mobility training;Therapeutic activities;Therapeutic exercise;Balance training;Neuromuscular re-education;Patient/family education    PT Goals (Current goals can be found in the Care Plan section)  Acute Rehab PT Goals Patient Stated Goal: to start to feel better PT Goal Formulation: With patient Time For Goal Achievement: 07/07/22 Potential to Achieve Goals: Good    Frequency Min 3X/week     Co-evaluation               AM-PAC PT "6 Clicks" Mobility  Outcome Measure Help needed turning from your back to your side while in a flat bed without using bedrails?: A Little Help needed moving from lying on your back to sitting on the side of a  flat bed without using bedrails?: A Little Help needed moving to and from a bed to a chair (including a wheelchair)?: A Little Help needed standing up from a chair using your arms (e.g., wheelchair or bedside chair)?: A Little Help needed to walk in hospital room?: A Little Help needed climbing 3-5 steps with a railing? : A Little 6 Click Score: 18    End of Session Equipment Utilized During Treatment: Gait belt Activity  Tolerance: Patient tolerated treatment well Patient left: in bed;with call bell/phone within reach;with bed alarm set Nurse Communication: Mobility status PT Visit Diagnosis: Unsteadiness on feet (R26.81);Other abnormalities of gait and mobility (R26.89);Muscle weakness (generalized) (M62.81);Difficulty in walking, not elsewhere classified (R26.2);Pain Pain - Right/Left: Left Pain - part of body: Hip    Time: 5465-6812 PT Time Calculation (min) (ACUTE ONLY): 28 min   Charges:   PT Evaluation $PT Eval Moderate Complexity: 1 Mod PT Treatments $Gait Training: 8-22 mins        Moishe Spice, PT, DPT Acute Rehabilitation Services  Office: [phone]   Orvan Falconer 06/23/2022, 4:37 PM

## 2022-06-23 NOTE — Op Note (Signed)
Orthopaedic Surgery Operative Note (CSN: [id number] ) Date of Surgery: 06/23/2022  Admit Date: 06/23/2022   Diagnoses: Pre-Op Diagnoses: Revision fixation of left intertrochanteric femur fracture Left hip wound drainage  Post-Op Diagnosis: Same  Procedures: CPT 10180-Irrigation and debridement of left hip wound drainage  Surgeons : Primary: Shona Needles, MD  Assistant: Patrecia Pace, PA-C  Location: OR 3   Anesthesia:General   Antibiotics: 1 gm vancomcyin powder and 1.2 gm tobramycin powder placed topically   Tourniquet time: None    Estimated Blood HENI:77 mL  Complications:* No complications entered in OR log *   Specimens: ID Type Source Tests Collected by Time Destination  A : Abscess Left Hip Abscess Abscess AEROBIC/ANAEROBIC CULTURE W GRAM STAIN (SURGICAL/DEEP WOUND) Shona Needles, MD 06/23/2022 [phone]   B : Abscess Left Hip Abscess Abscess AEROBIC/ANAEROBIC CULTURE W GRAM STAIN (SURGICAL/DEEP WOUND) Shona Needles, MD 06/23/2022 [phone]      Implants: * No implants in log *   Indications for Surgery: 87 year old female who had failed fixation of a previous cephalomedullary nail.  She underwent revision fixation of a cephalomedullary nail with placement of bone graft substitute to provide fixation for the femoral head fixation.  She was doing well but subsequently developed significant swelling of her hip and persistent drainage that had murky appearance to it.  Due to the persistent drainage I recommended proceeding with I&D.  We will plan for postoperative admission with broad-spectrum antibiotics with awaiting cultures to determine what appropriate antibiotics to discharge her on.  Risk and benefits were discussed with the patient.  Risks including but not limited to bleeding, infection, malunion, nonunion, hardware failure, hardware irritation, nerve or blood vessel injury, DVT, even the possibility anesthetic complications.  She agreed to proceed with surgery and  consent was obtained.  Operative Findings: Incision and drainage of left hip wound.  There is a soft tissue pocket that had no active collection in the subcutaneous fat area proximal to the lateral incision.  It also tracked deep to the fracture site and the hardware.  There was some bone graft substitute that was present that had tried to work its way out of the wound.  Cultures were taken and sent to micro.  Procedure: The patient was identified in the preoperative holding area. Consent was confirmed with the patient and their family and all questions were answered. The operative extremity was marked after confirmation with the patient. she was then brought back to the operating room by our anesthesia colleagues.  She was carefully transferred over to radiolucent flat top table.  She was placed under general anesthetic.  A bump was placed under her operative hip.  The left lower extremity was then prepped and draped in usual sterile fashion.  A timeout was performed to verify the patient, the procedure, and the extremity.  Preoperative antibiotics were not dosed due to cultures planned.  I open the lateral wound where the lag screw was placed.  There was active drainage that was present there is also a small amount of the calcium phosphate that had worked its while out of the wound.  I excised the draining sinus.  I then reopened the wound and extended proximally to access the subcutaneous fat layer.  There was a area that appeared to have a pocket free surrounding the fat that did not have any active purulence.  There was some tissue that appeared somewhat necrotic but no active infection or purulence that was present.  The wound did probe all  the way down to the fracture site and the hardware especially the lag screw.  He was some calcium phosphate cement in the soft tissues surrounding the hardware this was removed.  Cultures were taken of both the drainage as well as the soft tissue of the wound.  This  was sent to microbiology.  The wound was then irrigated with 3 L of normal saline.  A gram of vancomycin powder 1.2 g tobramycin powder were placed into the wound.  Fluoroscopic imaging showed that the hardware was stable.  I then closed the dead space with a 2-0 Monocryl and the skin was closed with 2-0 Monocryl and 3-0 nylon.  Sterile dressing was applied.  The patient was then awoken from anesthesia and taken to PACU in stable condition.  Post Op Plan/Instructions: Patient be weightbearing as tolerated to the left lower extremity.  We will admit her for broad-spectrum antibiotics and follow-up cultures.  We will likely need an ID consult to assist with antibiotic regimen pending the cultures.  I would estimate 48 to 72-hour hospital stay.  I was present and performed the entire surgery.  Patrecia Pace, PA-C did assist me throughout the case. An assistant was necessary given the difficulty in approach, maintenance of reduction and ability to instrument the fracture.   Katha Hamming, MD [facility]

## 2022-06-23 NOTE — Anesthesia Procedure Notes (Signed)
Procedure Name: Intubation Date/Time: 06/23/2022 8:33 AM  Performed by: Michele Rockers, CRNAPre-anesthesia Checklist: Patient identified, Patient being monitored, Timeout performed, Emergency Drugs available and Suction available Patient Re-evaluated:Patient Re-evaluated prior to induction Oxygen Delivery Method: Circle System Utilized Preoxygenation: Pre-oxygenation with 100% oxygen Induction Type: IV induction Ventilation: Mask ventilation without difficulty Laryngoscope Size: Miller and 2 Grade View: Grade I Tube type: Oral Tube size: 7.0 mm Number of attempts: 1 Airway Equipment and Method: Stylet Placement Confirmation: ETT inserted through vocal cords under direct vision, positive ETCO2 and breath sounds checked- equal and bilateral Secured at: 21 cm Tube secured with: Tape Dental Injury: Teeth and Oropharynx as per pre-operative assessment

## 2022-06-24 ENCOUNTER — Encounter (HOSPITAL_COMMUNITY): Payer: Self-pay | Admitting: Student

## 2022-06-24 ENCOUNTER — Other Ambulatory Visit: Payer: Self-pay

## 2022-06-24 DIAGNOSIS — T148XXA Other injury of unspecified body region, initial encounter: Secondary | ICD-10-CM | POA: Diagnosis not present

## 2022-06-24 DIAGNOSIS — L089 Local infection of the skin and subcutaneous tissue, unspecified: Secondary | ICD-10-CM | POA: Diagnosis not present

## 2022-06-24 DIAGNOSIS — M8618 Other acute osteomyelitis, other site: Secondary | ICD-10-CM

## 2022-06-24 DIAGNOSIS — M869 Osteomyelitis, unspecified: Secondary | ICD-10-CM

## 2022-06-24 HISTORY — DX: Osteomyelitis, unspecified: M86.9

## 2022-06-24 LAB — CBC
HCT: 29.2 % — ABNORMAL LOW (ref 36.0–46.0)
Hemoglobin: 9.2 g/dL — ABNORMAL LOW (ref 12.0–15.0)
MCH: 31.1 pg (ref 26.0–34.0)
MCHC: 31.5 g/dL (ref 30.0–36.0)
MCV: 98.6 fL (ref 80.0–100.0)
Platelets: 225 10*3/uL (ref 150–400)
RBC: 2.96 MIL/uL — ABNORMAL LOW (ref 3.87–5.11)
RDW: 14.5 % (ref 11.5–15.5)
WBC: 6.2 10*3/uL (ref 4.0–10.5)
nRBC: 0 % (ref 0.0–0.2)

## 2022-06-24 LAB — BASIC METABOLIC PANEL
Anion gap: 6 (ref 5–15)
BUN: 11 mg/dL (ref 8–23)
CO2: 21 mmol/L — ABNORMAL LOW (ref 22–32)
Calcium: 8.9 mg/dL (ref 8.9–10.3)
Chloride: 105 mmol/L (ref 98–111)
Creatinine, Ser: 0.63 mg/dL (ref 0.44–1.00)
GFR, Estimated: >60 mL/min (ref >60)
Glucose, Bld: 112 mg/dL — ABNORMAL HIGH (ref 70–99)
Potassium: 4.2 mmol/L (ref 3.5–5.1)
Sodium: 132 mmol/L — ABNORMAL LOW (ref 135–145)

## 2022-06-24 MED ORDER — DOXYCYCLINE HYCLATE 100 MG PO TABS
100.0000 mg | ORAL_TABLET | Freq: Two times a day (BID) | ORAL | Status: DC
  Administered 2022-06-24 – 2022-06-25 (×3): 100 mg via ORAL
  Filled 2022-06-24 (×3): qty 1

## 2022-06-24 MED ORDER — CEFADROXIL 500 MG PO CAPS
1000.0000 mg | ORAL_CAPSULE | Freq: Two times a day (BID) | ORAL | Status: DC
  Administered 2022-06-24 – 2022-06-25 (×3): 1000 mg via ORAL
  Filled 2022-06-24 (×3): qty 2

## 2022-06-24 MED ORDER — ACETAMINOPHEN 325 MG PO TABS
650.0000 mg | ORAL_TABLET | Freq: Four times a day (QID) | ORAL | Status: DC
  Administered 2022-06-24 – 2022-06-25 (×4): 650 mg via ORAL
  Filled 2022-06-24 (×5): qty 2

## 2022-06-24 MED ORDER — CEFADROXIL 500 MG PO CAPS: 1000.0000 mg | ORAL_CAPSULE | Freq: Three times a day (TID) | ORAL | Status: DC

## 2022-06-24 NOTE — Progress Notes (Addendum)
Orthopaedic Trauma Progress Note  SUBJECTIVE: Doing fairly well this morning. Notes pain is much improved from pre-op. Was able to ambulate in the hallway with therapies yesterday afternoon and did well with this. Notes the pain she had with weightbearing prior to surgery is no longer there. No chest pain. No SOB. No nausea/vomiting. No other complaints.  Tolerating diet and fluids.    OBJECTIVE:  Vitals:   06/24/22 0744 06/24/22 0807  BP: 91/68   Pulse: 77   Resp:    Temp:    SpO2: 98% 95%    General: Sitting up in bed, no acute distress Respiratory: No increased work of breathing.  Left lower extremity: Dressing clean, dry, intact.  No significant tenderness with palpation throughout  extremity.  Ankle DF/PF intact. + EHL/FHL.  Extremity warm and well-perfused.  Compartment soft compressible.  Neurovascularly intact  IMAGING: Stable post op imaging.   LABS:  Results for orders placed or performed during the hospital encounter of 06/23/22 (from the past 24 hour(s))  Basic metabolic panel     Status: Abnormal   Collection Time: 06/24/22  3:52 AM  Result Value Ref Range   Sodium 132 (L) 135 - 145 mmol/L   Potassium 4.2 3.5 - 5.1 mmol/L   Chloride 105 98 - 111 mmol/L   CO2 21 (L) 22 - 32 mmol/L   Glucose, Bld 112 (H) 70 - 99 mg/dL   BUN 11 8 - 23 mg/dL   Creatinine, Ser 0.63 0.44 - 1.00 mg/dL   Calcium 8.9 8.9 - 10.3 mg/dL   GFR, Estimated >60 >60 mL/min   Anion gap 6 5 - 15  CBC     Status: Abnormal   Collection Time: 06/24/22  3:52 AM  Result Value Ref Range   WBC 6.2 4.0 - 10.5 K/uL   RBC 2.96 (L) 3.87 - 5.11 MIL/uL   Hemoglobin 9.2 (L) 12.0 - 15.0 g/dL   HCT 29.2 (L) 36.0 - 46.0 %   MCV 98.6 80.0 - 100.0 fL   MCH 31.1 26.0 - 34.0 pg   MCHC 31.5 30.0 - 36.0 g/dL   RDW 14.5 11.5 - 15.5 %   Platelets 225 150 - 400 K/uL   nRBC 0.0 0.0 - 0.2 %    ASSESSMENT: Melanie Carrillo is a 87 y.o. female, 1 Day Post-Op s/p IRRIGATION AND DEBRIDEMENT OF LEFT HIP WOUND DRAINAGE    CV/Blood loss: Acute blood loss anemia, Hgb 9.2 this morning. Hemodynamically stable  PLAN: Weightbearing: WBAT LLE ROM: Okay for unrestricted hip and knee motion as tolerated Incisional and dressing care: Reinforce dressing PRN Showering: Hold off for now Orthopedic device(s): None  Pain management:  1. Tylenol 650 mg q 6 hours scheduled 2. Oxycodone 5 mg q 6 hours PRN 3. Morphine 0.5-1 mg q 2 hours PRN VTE prophylaxis: Aspirin 325 mg, SCDs ID:  Vancomycin + Ceftriaxone Foley/Lines:  No foley, KVO IVFs Impediments to Fracture Healing: Vitamin D level 54 when checked on 04/29/22, no supplementation needed  Dispo: Therapies as tolerated, PT/OT recommending home health. Consult ID today for assistance with antibiotics  D/C recommendations: -Oxycodone 5 mg for pain control - Aspirin 325 mg daily x30 days for DVT prophylaxis -No need for Vit D supplementation  Follow - up plan: 2 weeks after d/c   Contact information:  Katha Hamming MD, Rushie Nyhan PA-C. After hours and holidays please check [email or web address] for group call information for [facility]   Gwinda Passe, PA-C [phone] (office) [email or web address]

## 2022-06-24 NOTE — Evaluation (Signed)
Occupational Therapy Evaluation Patient Details Name: Melanie Carrillo MRN: [id number] DOB: 08-31-1934 Today's Date: 06/24/2022   History of Present Illness 87 yo presented 10/31 for surgery, with the diagnosis of Left hip infection. 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 S/p I&D left hip 11/01  PMH significant for hypertension, GERD, interstitial lung disease   Clinical Impression   Pt in bed upon therapy arrival and agreeable to participate in OT evaluation. Patient is currently functioning at baseline for BADL. She lives with Daughter, who is a Marine scientist, and receives assistance with LB bathing and dressing. Patient is able to return home when medically ready and continue to receive assistance from daughter as needed.      Recommendations for follow up therapy are one component of a multi-disciplinary discharge planning process, led by the attending physician.  Recommendations may be updated based on patient status, additional functional criteria and insurance authorization.   Follow Up Recommendations  No OT follow up    Assistance Recommended at Discharge PRN  Patient can return home with the following A little help with bathing/dressing/bathroom;Assistance with cooking/housework;Assist for transportation;Help with stairs or ramp for entrance    Functional Status Assessment  Patient has not had a recent decline in their functional status  Equipment Recommendations  None recommended by OT       Precautions / Restrictions Precautions Precautions: Fall Restrictions Weight Bearing Restrictions: Yes LLE Weight Bearing: Weight bearing as tolerated      Mobility Bed Mobility Overal bed mobility: Modified Independent Bed Mobility: Supine to Sit     Supine to sit: HOB elevated, Modified independent (Device/Increase time)       Patient Response: Cooperative  Transfers Overall transfer level: Needs  assistance Equipment used: Rolling walker (2 wheels) Transfers: Bed to chair/wheelchair/BSC, Sit to/from Stand Sit to Stand: Min guard     Step pivot transfers: Min guard            Balance Overall balance assessment: Mild deficits observed, not formally tested Sitting-balance support: No upper extremity supported, Feet supported Sitting balance-Leahy Scale: Good     Standing balance support: During functional activity, Single extremity supported Standing balance-Leahy Scale: Good         ADL either performed or assessed with clinical judgement   ADL Overall ADL's : At baseline     General ADL Comments: Patient is Mod I with UB bathing and dressing. Min A for LB bathing and dressing (uses LHS at home for bathing). toileting: Mod I     Vision Baseline Vision/History: 1 Wears glasses (for reading) Ability to See in Adequate Light: 0 Adequate Patient Visual Report: No change from baseline Vision Assessment?: No apparent visual deficits     Perception Perception Perception: Within Functional Limits   Praxis Praxis Praxis: Intact    Pertinent Vitals/Pain Pain Assessment Pain Assessment: No/denies pain Faces Pain Scale: No hurt     Hand Dominance Right   Extremity/Trunk Assessment Upper Extremity Assessment Upper Extremity Assessment: Overall WFL for tasks assessed   Lower Extremity Assessment Lower Extremity Assessment: Defer to PT evaluation       Communication Communication Communication: HOH   Cognition Arousal/Alertness: Awake/alert Behavior During Therapy: WFL for tasks assessed/performed Overall Cognitive Status: Within Functional Limits for tasks assessed       General Comments  VSS            Home Living Family/patient expects to be discharged to:: Private residence Living Arrangements:  Children (daughter) Available Help at Discharge: Family;Available 24 hours/day Type of Home: House Home Access: Stairs to enter;Ramped  entrance Entrance Stairs-Number of Steps: 4 Entrance Stairs-Rails: None Home Layout: One level     Bathroom Shower/Tub: Occupational psychologist: Standard     Home Equipment: Conservation officer, nature (2 wheels);Rollator (4 wheels);Cane - single point;BSC/3in1;Wheelchair - manual;Hospital bed;Other (comment);Adaptive equipment (lift chair) Adaptive Equipment: Long-handled sponge;Reacher Additional Comments: Pt lives with family and able to provide 24/7 care. Pt's daughter is a Marine scientist. Pt has a lift chair.      Prior Functioning/Environment Prior Level of Function : Needs assist       Physical Assist : Mobility (physical);ADLs (physical)   ADLs (physical): Bathing;Dressing;IADLs Mobility Comments: Mod I-has been recently using the RW, but was using a cane before 04/28/22 ADLs Comments: Daughter does apply lotion on lower half but pt can use extended sponge for reaching with bathing and reports she is capable of dressing herself; daughter provides transportation        OT Problem List: Decreased strength      OT Treatment/Interventions:      OT Goals(Current goals can be found in the care plan section) Acute Rehab OT Goals Patient Stated Goal: to be able to go home  OT Frequency:         AM-PAC OT "6 Clicks" Daily Activity     Outcome Measure Help from another person eating meals?: None Help from another person taking care of personal grooming?: None Help from another person toileting, which includes using toliet, bedpan, or urinal?: None Help from another person bathing (including washing, rinsing, drying)?: A Little Help from another person to put on and taking off regular upper body clothing?: None Help from another person to put on and taking off regular lower body clothing?: A Little 6 Click Score: 22   End of Session Equipment Utilized During Treatment: Gait belt;Rolling walker (2 wheels)  Activity Tolerance: Patient tolerated treatment well Patient left: in  chair;with call bell/phone within reach  OT Visit Diagnosis: Muscle weakness (generalized) (M62.81)                Time: 7591-6384 OT Time Calculation (min): 37 min Charges:  OT General Charges $OT Visit: 1 Visit OT Evaluation $OT Eval Moderate Complexity: 1 Mod  [employer], OTR/L,CBIS  Supplemental OT - [facility] and [facility]   Nikki Glanzer, Clarene Duke 06/24/2022, 1:19 PM

## 2022-06-24 NOTE — Consult Note (Signed)
[facility] for Infectious Disease    Date of Admission:  06/23/2022     Total days of antibiotics 1                 Reason for Consult: Possible HW infection     Referring Provider: [place]  Primary Care Provider: Gayland Curry, MD   Assessment: Melanie Carrillo is a 87 y.o. female with a history of cephalomedularly nailing of the left subtrochanteric fracture in May 2023. Unfortunately the nail failed and required revision and new hardware placement April 28, 2022 with Dr. Doreatha Martin. She recently experienced increased incisional pain and had eruption of bullous lesion that spontaneously erupted purulent appearing fluid. Without any improvement on oral doxycycline she was taken to OR for debridement - tissue was not grossly infected by appearance however there was communicating sinus to [facility]. While could be possible r/t particular bone cement used, would err on the side that this has infection component to it. ESR is 59 but CRP only mildly elevated. Would start doxycycline + cefadroxil 1gm BID to cover MRSA and other skin related pathogens we would worry about here. Will follow intra-op cultures to maturity. Would commit to a 6 week plan then stop to see how she does/heals off abx.    Plan: Start doxycycline 100 mg BID with meals  Start Cefadroxil 1gm BID   FU arranged with Dr. Juleen China in ~3 weeks 11/27@ 9:30   Principal Problem:   Wound infection    acetaminophen  650 mg Oral Q6H   amLODipine  5 mg Oral Daily   aspirin  325 mg Oral Daily   bisacodyl  5 mg Oral q AM   brimonidine  1 drop Both Eyes BID   cefadroxil  1,000 mg Oral BID   doxycycline  100 mg Oral Q12H   guaiFENesin  600 mg Oral BID   latanoprost  1 drop Both Eyes QHS   losartan  100 mg Oral Daily   meloxicam  7.5 mg Oral Daily   montelukast  10 mg Oral QHS   pantoprazole  80 mg Oral Daily   pravastatin  40 mg Oral q1800   sertraline  25 mg Oral q AM    HPI: Melanie Carrillo is a 87 y.o. female  admitted for elective I&D of the left hip under Dr. Tama Headings service.   Ms. Cullimore is a 87 yo female who underwent cephalomedullary nailing of left subtrochanteric femur fracture by Dr. Sharlet Salina at [facility] in May 2023. She experienced a failure of the nail (break) which prompted evaluation by orthopedic traumaologist team. Dr. Doreatha Martin repaired the left subtrochanteric femur fracture nonunion on 04/28/22. Initially doing quite well Mr. Ohms described worsening pain over the hip incision and describes a bullous lesion that came up in the spot of pain that opened up draining dark bloody purulent fluid spontaneously. This continued to have some drainage that was clear/pink but then appeared possibly purulent. She was on doxycycline for about 5 days that did not yield any improvement and decision was made to take her to OR for elective I&D of the area. She did not have any systemic signs of illness and felt better after it opened up. She tolerated the antibiotic well without any side effect.    D/W Dr. Doreatha Martin and Judson Roch - did appear to communicate to the region of hardware. Bone did not look to be unhealthy but not deeply explored given how good things looked on  the more superficial aspects of the incision. IV treatment would be difficult for her and her social situation currently. Initial gram stain without organisms on GS and no growth at 24h.    Review of Systems: Review of Systems  Constitutional:  Negative for chills and fever.  HENT:  Negative for tinnitus.   Eyes:  Negative for blurred vision and photophobia.  Respiratory:  Negative for cough and sputum production.   Cardiovascular:  Negative for chest pain.  Gastrointestinal:  Negative for diarrhea, nausea and vomiting.  Genitourinary:  Negative for dysuria.  Musculoskeletal:  Positive for joint pain (left hip).  Skin:  Negative for rash.  Neurological:  Negative for headaches.     Past Medical History:  Diagnosis Date   Colon cancer ([place])     Constipation    Dyspnea    GERD (gastroesophageal reflux disease)    Glaucoma    HLD (hyperlipidemia)    Hypertension     Social History   Tobacco Use   Smoking status: Never   Smokeless tobacco: Never  Vaping Use   Vaping Use: Never used  Substance Use Topics   Alcohol use: Never   Drug use: Never    History reviewed. No pertinent family history. No Known Allergies  OBJECTIVE: Blood pressure 91/68, pulse 77, temperature 98.5 F (36.9 C), temperature source Oral, resp. rate 16, height _0  (1.6 m), weight 69.4 kg, SpO2 95 %.   Physical Exam Vitals and nursing note reviewed.  Constitutional:      Appearance: Normal appearance.     Comments: Sitting up in chair comfortably.   Cardiovascular:     Rate and Rhythm: Normal rate and regular rhythm.  Pulmonary:     Effort: Pulmonary effort is normal.     Breath sounds: Normal breath sounds.  Abdominal:     General: Bowel sounds are normal.     Palpations: Abdomen is soft.  Musculoskeletal:     Comments: Clean surgical bandage.   Skin:    General: Skin is warm and dry.  Neurological:     Mental Status: She is alert and oriented to person, place, and time.     Lab Results Lab Results  Component Value Date   WBC 6.2 06/24/2022   HGB 9.2 (L) 06/24/2022   HCT 29.2 (L) 06/24/2022   MCV 98.6 06/24/2022   PLT 225 06/24/2022    Lab Results  Component Value Date   CREATININE 0.63 06/24/2022   BUN 11 06/24/2022   NA 132 (L) 06/24/2022   K 4.2 06/24/2022   CL 105 06/24/2022   CO2 21 (L) 06/24/2022    Lab Results  Component Value Date   ALT 26 05/02/2022   AST 31 05/02/2022   ALKPHOS 80 05/02/2022   BILITOT 1.1 05/02/2022     Microbiology: Recent Results (from the past 240 hour(s))  Aerobic/Anaerobic Culture w Gram Stain (surgical/deep wound)     Status: None (Preliminary result)   Collection Time: 06/23/22  9:02 AM   Specimen: Abscess  Result Value Ref Range Status   Specimen Description ABSCESS  Final    Special Requests NONE  Final   Gram Stain   Final    RARE WBC PRESENT, PREDOMINANTLY PMN NO ORGANISMS SEEN    Culture   Final    NO GROWTH < 24 HOURS Performed at [facility], [place] [address] [place], [place] [address]    Report Status PENDING  Incomplete  Aerobic/Anaerobic Culture w Gram Stain (surgical/deep wound)  Status: None (Preliminary result)   Collection Time: 06/23/22  9:04 AM   Specimen: Abscess  Result Value Ref Range Status   Specimen Description ABSCESS  Final   Special Requests NONE  Final   Gram Stain NO WBC SEEN NO ORGANISMS SEEN   Final   Culture   Final    NO GROWTH < 24 HOURS Performed at [facility], [address], [place], [place] [address]    Report Status PENDING  Incomplete    Janene Madeira, MSN, NP-C [facility] for Infectious Disease [email or web address] Pager: [phone] Office: [place]: [phone] *[place] Communication Welcome  06/24/2022 12:44 PM

## 2022-06-24 NOTE — Progress Notes (Signed)
Patient upset at the timing of her eye drop medications.  She prefers Alphagan administered at 0800 and 1400.  Pharmacy was unable to complete this request today due to todays dosing schedule.  Daytime RN needs to follow up with pharmacy tomorrow (06/25/22).

## 2022-06-24 NOTE — Plan of Care (Signed)

## 2022-06-24 NOTE — TOC Initial Note (Signed)
Transition of Care [facility]) - Initial/Assessment Note    Patient Details  Name: Melanie Carrillo MRN: [id number] Date of Birth: 04/06/1935  [facility]) CM/SW Contact:    Ninfa Meeker, RN Phone Number: 06/24/2022, 2:28 PM  Clinical Narrative:                 Case Manager spoke with patient concerning discharge plans. Discussed options for [place], she states she had services with [facility] in the past and wants to do so now. CM called referralto Claiborne Billings, [place] liaison. Patient states she has RW, 3in1, requests a shower chair. [employer] will deliver to patient's room. Patient lives with her daughter and will have support at discharge.   Expected Discharge Plan: [place] Barriers to Discharge: Continued Medical Work up   Patient Goals and CMS Choice     Choice offered to / list presented to : Patient  Expected Discharge Plan and Services Expected Discharge Plan: [place]   Discharge Planning Services: CM Consult Post Acute Care Choice: Home Health, Durable Medical Equipment                   DME Arranged:  (Needs Shower Chair) DME Agency: [facility] Date DME Agency Contacted: 06/24/22 Time DME Agency Contacted: 1218 Representative spoke with at DME Agency: Lanna Poche HH Arranged: PT, OT [place] Agency: [place] Date [place]: 06/24/22 Time [place]: 56 Representative spoke with at [place]: [place] Arrangements/Services   Lives with:: Adult Children Patient language and need for interpreter reviewed:: Yes Do you feel safe going back to the place where you live?: Yes      Need for Family Participation in Patient Care: Yes (Comment) Care giver support system in place?: Yes (comment)   Criminal Activity/Legal Involvement Pertinent to Current Situation/Hospitalization: No - Comment as needed  Activities of Daily Living Home Assistive Devices/Equipment: Dentures  (specify type), Hearing aid ADL Screening (condition at time of admission) Patient's cognitive ability adequate to safely complete daily activities?: Yes Is the patient deaf or have difficulty hearing?: Yes Does the patient have difficulty seeing, even when wearing glasses/contacts?: No Does the patient have difficulty concentrating, remembering, or making decisions?: No Patient able to express need for assistance with ADLs?: Yes Does the patient have difficulty dressing or bathing?: No Independently performs ADLs?: Yes (appropriate for developmental age) Does the patient have difficulty walking or climbing stairs?: No Weakness of Legs: None Weakness of Arms/Hands: None  Permission Sought/Granted         Permission granted to share info w AGENCY: [place] granted to share info w Relationship: daughter     Emotional Assessment   Attitude/Demeanor/Rapport: Set designer, Engaged   Orientation: : Oriented to Self, Oriented to Place, Oriented to  Time, Oriented to Situation Alcohol / Substance Use: Not Applicable Psych Involvement: No (comment)  Admission diagnosis:  Wound infection [T14.8XXA, L08.9] Patient Active Problem List   Diagnosis Date Noted   Osteomyelitis ([place]) 06/24/2022   Wound infection 06/23/2022   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

## 2022-06-25 LAB — CBC
HCT: 31.2 % — ABNORMAL LOW (ref 36.0–46.0)
Hemoglobin: 9.9 g/dL — ABNORMAL LOW (ref 12.0–15.0)
MCH: 31.3 pg (ref 26.0–34.0)
MCHC: 31.7 g/dL (ref 30.0–36.0)
MCV: 98.7 fL (ref 80.0–100.0)
Platelets: 255 10*3/uL (ref 150–400)
RBC: 3.16 MIL/uL — ABNORMAL LOW (ref 3.87–5.11)
RDW: 14.8 % (ref 11.5–15.5)
WBC: 8 10*3/uL (ref 4.0–10.5)
nRBC: 0 % (ref 0.0–0.2)

## 2022-06-25 MED ORDER — DOXYCYCLINE HYCLATE 100 MG PO TABS: 100.0000 mg | ORAL_TABLET | Freq: Two times a day (BID) | ORAL | 0 refills | Status: DC

## 2022-06-25 MED ORDER — ASPIRIN 325 MG PO TABS: 325.0000 mg | ORAL_TABLET | Freq: Every day | ORAL | 0 refills | Status: AC

## 2022-06-25 MED ORDER — OXYCODONE HCL 5 MG PO TABS: 5.0000 mg | ORAL_TABLET | Freq: Four times a day (QID) | ORAL | 0 refills | Status: DC | PRN

## 2022-06-25 MED ORDER — CEFADROXIL 500 MG PO CAPS: 1000.0000 mg | ORAL_CAPSULE | Freq: Two times a day (BID) | ORAL | 0 refills | Status: DC

## 2022-06-25 NOTE — Care Management Important Message (Signed)
Important Message  Patient Details  Name: Melanie Carrillo MRN: [id number] Date of Birth: 29-Jan-1935   Medicare Important Message Given:  Yes     Hannah Beat 06/25/2022, 2:01 PM

## 2022-06-25 NOTE — Progress Notes (Signed)
Orthopaedic Trauma Progress Note  SUBJECTIVE: Doing well this morning.  Pain controlled.  Was able to mobilize around the room and notes no significant increase in pain with weightbearing.  Feels ready to discharge home today.  Was seen by infectious disease yesterday and transition to oral antibiotics.  Has been tolerating his medications well.  No other specific issues of note.  Intraoperative cultures with no growth over the last 48 hours.   OBJECTIVE:  Vitals:   06/24/22 2117 06/25/22 0851  BP:  (!) 155/76  Pulse:  96  Resp: 16 18  Temp: 98.3 F (36.8 C)   SpO2:  96%    General: Sitting up in bedside chair eating breakfast, no acute distress Respiratory: No increased work of breathing.  Left lower extremity: Dressing clean, dry, intact.  No significant tenderness with palpation throughout  extremity.  Ankle DF/PF intact. + EHL/FHL.  Extremity warm and well-perfused.  Compartment soft compressible.  Neurovascularly intact  IMAGING: Stable post op imaging.   LABS:  Results for orders placed or performed during the hospital encounter of 06/23/22 (from the past 24 hour(s))  CBC     Status: Abnormal   Collection Time: 06/25/22  3:32 AM  Result Value Ref Range   WBC 8.0 4.0 - 10.5 K/uL   RBC 3.16 (L) 3.87 - 5.11 MIL/uL   Hemoglobin 9.9 (L) 12.0 - 15.0 g/dL   HCT 31.2 (L) 36.0 - 46.0 %   MCV 98.7 80.0 - 100.0 fL   MCH 31.3 26.0 - 34.0 pg   MCHC 31.7 30.0 - 36.0 g/dL   RDW 14.8 11.5 - 15.5 %   Platelets 255 150 - 400 K/uL   nRBC 0.0 0.0 - 0.2 %    ASSESSMENT: Melanie Carrillo is a 87 y.o. female, 2 Days Post-Op s/p IRRIGATION AND DEBRIDEMENT OF LEFT HIP WOUND DRAINAGE   CV/Blood loss: Acute blood loss anemia, Hgb 9.2 this morning. Hemodynamically stable  PLAN: Weightbearing: WBAT LLE ROM: Okay for unrestricted hip and knee motion as tolerated Incisional and dressing care: Incisions may be left open to air.  Continue to change as needed Showering: Okay to begin showering and  getting incisions wet 06/26/2022 Orthopedic device(s): None  Pain management:  1. Tylenol 650 mg q 6 hours scheduled 2. Oxycodone 5 mg q 6 hours PRN 3. Morphine 0.5-1 mg q 2 hours PRN VTE prophylaxis: Aspirin 325 mg, SCDs ID: Doxycycline + cefadroxil Foley/Lines:  No foley, KVO IVFs Impediments to Fracture Healing: Vitamin D level 54 when checked on 04/29/22, no supplementation needed  Dispo: Continue with therapies as able.  PT/OT recommending home health.  TOC following and has gotten this set up.  Discharge home later today.  Medications have been sent to patient's outside pharmacy on file.  D/C recommendations: - Oxycodone 5 mg for pain control - Aspirin 325 mg daily x30 days for DVT prophylaxis - No need for Vit D supplementation  Follow - up plan: 2 weeks after d/c   Contact information:  Katha Hamming MD, Rushie Nyhan PA-C. After hours and holidays please check [email or web address] for group call information for [facility]   Gwinda Passe, PA-C [phone] (office) [email or web address]

## 2022-06-25 NOTE — TOC Transition Note (Signed)
Transition of Care [facility]) - CM/SW Discharge Note   Patient Details  Name: Melanie Carrillo MRN: [id number] Date of Birth: 1934-12-15  Transition of [facility]) CM/SW Contact:  Pollie Friar, RN Phone Number: 06/25/2022, 11:23 AM   Clinical Narrative:    Pt is discharging home with home health services through [place]. Information on the AVS.  DME to room per [facility].  Pt has transportation home.   Final next level of care: [place] Barriers to Discharge: No Barriers Identified   Patient Goals and CMS Choice     Choice offered to / list presented to : Patient  Discharge Placement                       Discharge Plan and Services   Discharge Planning Services: CM Consult Post Acute Care Choice: Home Health, Durable Medical Equipment          DME Arranged:  (Needs Shower Chair) DME Agency: [facility] Date DME Agency Contacted: 06/24/22 Time DME Agency Contacted: 1218 Representative spoke with at DME Agency: [place]: PT, OT [place] Agency: [place] Date [place]: 06/24/22 Time [place]: [id number] Representative spoke with at [place]: [place] Determinants of Health ([place]) Interventions     Readmission Risk Interventions     No data to display

## 2022-06-25 NOTE — Discharge Instructions (Signed)
[facility] Discharge Instructions   General Discharge Instructions  WEIGHT BEARING STATUS: Weightbearing as tolerated  RANGE OF MOTION/ACTIVITY: Unrestricted hip and knee motion  Wound Care: You may remove your surgical dressings.  Incisions can be left open to air if there is no drainage. If incision continues to have drainage, use a foam dressing.  You may begin showering Saturday, 06/26/2022.  Clean incisions gently with soap and water  DVT/PE prophylaxis: Aspirin 325 mg daily x30 days  Diet: as you were eating previously.  Can use over the counter stool softeners and bowel preparations, such as Miralax, to help with bowel movements.  Narcotics can be constipating.  Be sure to drink plenty of fluids  PAIN MEDICATION USE AND EXPECTATIONS  You have likely been given narcotic medications to help control your pain.  After a traumatic event that results in an fracture (broken bone) with or without surgery, it is ok to use narcotic pain medications to help control one's pain.  We understand that everyone responds to pain differently and each individual patient will be evaluated on a regular basis for the continued need for narcotic medications. Ideally, narcotic medication use should last no more than 6-8 weeks (coinciding with fracture healing).   As a patient it is your responsibility as well to monitor narcotic medication use and report the amount and frequency you use these medications when you come to your office visit.   We would also advise that if you are using narcotic medications, you should take a dose prior to therapy to maximize you participation.  IF YOU ARE ON NARCOTIC MEDICATIONS IT IS NOT PERMISSIBLE TO OPERATE A MOTOR VEHICLE (MOTORCYCLE/CAR/TRUCK/MOPED) OR HEAVY MACHINERY DO NOT MIX NARCOTICS WITH OTHER CNS (CENTRAL NERVOUS SYSTEM) DEPRESSANTS SUCH AS ALCOHOL   STOP SMOKING OR USING NICOTINE PRODUCTS!!!!  As discussed nicotine severely impairs your body's  ability to heal surgical and traumatic wounds but also impairs bone healing.  Wounds and bone heal by forming microscopic blood vessels (angiogenesis) and nicotine is a vasoconstrictor (essentially, shrinks blood vessels).  Therefore, if vasoconstriction occurs to these microscopic blood vessels they essentially disappear and are unable to deliver necessary nutrients to the healing tissue.  This is one modifiable factor that you can do to dramatically increase your chances of healing your injury.    (This means no smoking, no nicotine gum, patches, etc)  DO NOT USE NONSTEROIDAL ANTI-INFLAMMATORY DRUGS (NSAID'S)  Using products such as Advil (ibuprofen), Aleve (naproxen), Motrin (ibuprofen) for additional pain control during fracture healing can delay and/or prevent the healing response.  If you would like to take over the counter (OTC) medication, Tylenol (acetaminophen) is ok.  However, some narcotic medications that are given for pain control contain acetaminophen as well. Therefore, you should not exceed more than 4000 mg of tylenol in a day if you do not have liver disease.  Also note that there are may OTC medicines, such as cold medicines and allergy medicines that my contain tylenol as well.  If you have any questions about medications and/or interactions please ask your doctor/PA or your pharmacist.      ICE AND ELEVATE INJURED/OPERATIVE EXTREMITY  Using ice and elevating the injured extremity above your heart can help with swelling and pain control.  Icing in a pulsatile fashion, such as 20 minutes on and 20 minutes off, can be followed.    Do not place ice directly on skin. Make sure there is a barrier between to skin and the ice  pack.    Using frozen items such as frozen peas works well as the conform nicely to the are that needs to be iced.  USE AN ACE WRAP OR TED HOSE FOR SWELLING CONTROL  In addition to icing and elevation, Ace wraps or TED hose are used to help limit and resolve swelling.   It is recommended to use Ace wraps or TED hose until you are informed to stop.    When using Ace Wraps start the wrapping distally (farthest away from the body) and wrap proximally (closer to the body)   Example: If you had surgery on your leg or thing and you do not have a splint on, start the ace wrap at the toes and work your way up to the thigh        If you had surgery on your upper extremity and do not have a splint on, start the ace wrap at your fingers and work your way up to the upper arm  [place]: [phone]   VISIT OUR WEBSITE FOR ADDITIONAL INFORMATION: [email or web address]    Discharge Wound Care Instructions  Do NOT apply any ointments, solutions or lotions to pin sites or surgical wounds.  These prevent needed drainage and even though solutions like hydrogen peroxide kill bacteria, they also damage cells lining the pin sites that help fight infection.  Applying lotions or ointments can keep the wounds moist and can cause them to breakdown and open up as well. This can increase the risk for infection. When in doubt call the office.  Once the incision is completely dry and without drainage, it may be left open to air out.  Incision should be cleaned gently with soap and water on a daily basis

## 2022-06-27 NOTE — Discharge Summary (Signed)
Orthopaedic Trauma Service (OTS) Discharge Summary   Patient ID: Melanie Carrillo MRN: [id number] DOB/AGE: 1934/10/10 87 y.o.  Admit date: 06/23/2022 Discharge date: 06/25/2022  Admission Diagnoses:Left hip wound infection   Discharge Diagnoses:  Principal Problem:   Wound infection Active Problems:   Osteomyelitis ([place])   Past Medical History:  Diagnosis Date   Colon cancer ([place])    Constipation    Dyspnea    GERD (gastroesophageal reflux disease)    Glaucoma    HLD (hyperlipidemia)    Hypertension      Procedures Performed: CPT 10180-Irrigation and debridement of left hip wound drainage   Discharged Condition: good  Hospital Course: Patient presented [facility] on 06/23/2022 for scheduled procedure of left lower extremity.  Same the operating room Dr. Doreatha Martin for the above procedure.  Intraoperative cultures were obtained and sent to the lab.  She tolerated this well without complications.  Patient admitted to orthopedic service for IV antibiotics.  Postoperatively, patient noted improvement in her pain from preoperatively.  She began working with physical therapy on the afternoon of surgery, was able to mobilize much better with minimal pain.  Patient was started on aspirin for DVT prophylaxis starting on postoperative day #1.  Due to concern for infection, infectious disease was consulted for assistance with discharge antibiotics.  There was no growth on aerobic or anaerobic cultures x48 hours. On 06/27/2022, the patient was tolerating diet, working well with therapies, pain well controlled, vital signs stable, dressings clean, dry, intact and felt stable for discharge to home. Patient will follow up as below and knows to call with questions or concerns.     Consults: None  Significant Diagnostic Studies:   Results for orders placed or performed during the hospital encounter of 06/23/22 (from the past 168 hour(s))  CBC   Collection Time: 06/23/22  6:17 AM   Result Value Ref Range   WBC 7.2 4.0 - 10.5 K/uL   RBC 3.64 (L) 3.87 - 5.11 MIL/uL   Hemoglobin 11.4 (L) 12.0 - 15.0 g/dL   HCT 36.5 36.0 - 46.0 %   MCV 100.3 (H) 80.0 - 100.0 fL   MCH 31.3 26.0 - 34.0 pg   MCHC 31.2 30.0 - 36.0 g/dL   RDW 14.6 11.5 - 15.5 %   Platelets 285 150 - 400 K/uL   nRBC 0.0 0.0 - 0.2 %  Basic metabolic panel   Collection Time: 06/23/22  6:17 AM  Result Value Ref Range   Sodium 133 (L) 135 - 145 mmol/L   Potassium 4.2 3.5 - 5.1 mmol/L   Chloride 102 98 - 111 mmol/L   CO2 22 22 - 32 mmol/L   Glucose, Bld 108 (H) 70 - 99 mg/dL   BUN 10 8 - 23 mg/dL   Creatinine, Ser 0.62 0.44 - 1.00 mg/dL   Calcium 10.0 8.9 - 10.3 mg/dL   GFR, Estimated >60 >60 mL/min   Anion gap 9 5 - 15  Aerobic/Anaerobic Culture w Gram Stain (surgical/deep wound)   Collection Time: 06/23/22  9:02 AM   Specimen: Abscess  Result Value Ref Range   Specimen Description ABSCESS    Special Requests NONE    Gram Stain      RARE WBC PRESENT, PREDOMINANTLY PMN NO ORGANISMS SEEN    Culture      NO GROWTH 4 DAYS NO ANAEROBES ISOLATED; CULTURE IN PROGRESS FOR 5 DAYS Performed at [facility], [address] [place], [place] [address]  Report Status PENDING   Aerobic/Anaerobic Culture w Gram Stain (surgical/deep wound)   Collection Time: 06/23/22  9:04 AM   Specimen: Abscess  Result Value Ref Range   Specimen Description ABSCESS    Special Requests NONE    Gram Stain NO WBC SEEN NO ORGANISMS SEEN     Culture      NO GROWTH 4 DAYS NO ANAEROBES ISOLATED; CULTURE IN PROGRESS FOR 5 DAYS Performed at [facility], [address] [place], [place] [address]    Report Status PENDING   Sedimentation rate   Collection Time: 06/23/22 10:44 AM  Result Value Ref Range   Sed Rate 59 (H) 0 - 22 mm/hr  C-reactive protein   Collection Time: 06/23/22 10:44 AM  Result Value Ref Range   CRP 1.2 (H) <1.0 mg/dL  Basic metabolic panel   Collection Time: 06/24/22  3:52 AM  Result  Value Ref Range   Sodium 132 (L) 135 - 145 mmol/L   Potassium 4.2 3.5 - 5.1 mmol/L   Chloride 105 98 - 111 mmol/L   CO2 21 (L) 22 - 32 mmol/L   Glucose, Bld 112 (H) 70 - 99 mg/dL   BUN 11 8 - 23 mg/dL   Creatinine, Ser 0.63 0.44 - 1.00 mg/dL   Calcium 8.9 8.9 - 10.3 mg/dL   GFR, Estimated >60 >60 mL/min   Anion gap 6 5 - 15  CBC   Collection Time: 06/24/22  3:52 AM  Result Value Ref Range   WBC 6.2 4.0 - 10.5 K/uL   RBC 2.96 (L) 3.87 - 5.11 MIL/uL   Hemoglobin 9.2 (L) 12.0 - 15.0 g/dL   HCT 29.2 (L) 36.0 - 46.0 %   MCV 98.6 80.0 - 100.0 fL   MCH 31.1 26.0 - 34.0 pg   MCHC 31.5 30.0 - 36.0 g/dL   RDW 14.5 11.5 - 15.5 %   Platelets 225 150 - 400 K/uL   nRBC 0.0 0.0 - 0.2 %  CBC   Collection Time: 06/25/22  3:32 AM  Result Value Ref Range   WBC 8.0 4.0 - 10.5 K/uL   RBC 3.16 (L) 3.87 - 5.11 MIL/uL   Hemoglobin 9.9 (L) 12.0 - 15.0 g/dL   HCT 31.2 (L) 36.0 - 46.0 %   MCV 98.7 80.0 - 100.0 fL   MCH 31.3 26.0 - 34.0 pg   MCHC 31.7 30.0 - 36.0 g/dL   RDW 14.8 11.5 - 15.5 %   Platelets 255 150 - 400 K/uL   nRBC 0.0 0.0 - 0.2 %     Treatments: IV hydration, antibiotics: vancomycin and ceftriaxone, analgesia: acetaminophen, Morphine, and oxycodone, anticoagulation: ASA, therapies: PT and OT, and surgery: As above  Discharge Exam: General: Sitting up in bedside chair eating breakfast, no acute distress Respiratory: No increased work of breathing.  Left lower extremity: Dressing clean, dry, intact.  No significant tenderness with palpation throughout  extremity.  Ankle DF/PF intact. + EHL/FHL.  Extremity warm and well-perfused.  Compartment soft compressible.  Neurovascularly intact  Disposition: Discharge disposition: 06-Home-Health Care Svc       Discharge Instructions     Call MD / Call 911   Complete by: As directed    If you experience chest pain or shortness of breath, CALL 911 and be transported to the hospital emergency room.  If you develope a fever above 101 F,  pus (white drainage) or increased drainage or redness at the wound, or calf pain, call your surgeon's office.  Constipation Prevention   Complete by: As directed    Drink plenty of fluids.  Prune juice may be helpful.  You may use a stool softener, such as Colace (over the counter) 100 mg twice a day.  Use MiraLax (over the counter) for constipation as needed.   Diet - low sodium heart healthy   Complete by: As directed    Increase activity slowly as tolerated   Complete by: As directed    Post-operative opioid taper instructions:   Complete by: As directed    POST-OPERATIVE OPIOID TAPER INSTRUCTIONS: It is important to wean off of your opioid medication as soon as possible. If you do not need pain medication after your surgery it is ok to stop day one. Opioids include: Codeine, Hydrocodone(Norco, Vicodin), Oxycodone(Percocet, oxycontin) and hydromorphone amongst others.  Long term and even short term use of opiods can cause: Increased pain response Dependence Constipation Depression Respiratory depression And more.  Withdrawal symptoms can include Flu like symptoms Nausea, vomiting And more Techniques to manage these symptoms Hydrate well Eat regular healthy meals Stay active Use relaxation techniques(deep breathing, meditating, yoga) Do Not substitute Alcohol to help with tapering If you have been on opioids for less than two weeks and do not have pain than it is ok to stop all together.  Plan to wean off of opioids This plan should start within one week post op of your joint replacement. Maintain the same interval or time between taking each dose and first decrease the dose.  Cut the total daily intake of opioids by one tablet each day Next start to increase the time between doses. The last dose that should be eliminated is the evening dose.         Allergies as of 06/25/2022   No Known Allergies      Medication List     STOP taking these medications    Eliquis  2.5 MG Tabs tablet Generic drug: apixaban   ondansetron 4 MG tablet Commonly known as: ZOFRAN   polyethylene glycol powder 17 GM/SCOOP powder Commonly known as: GLYCOLAX/MIRALAX   senna-docusate 8.6-50 MG tablet Commonly known as: Senokot-S       TAKE these medications    acetaminophen 325 MG tablet Commonly known as: TYLENOL Take 1-2 tablets (325-650 mg total) by mouth every 6 (six) hours as needed for mild pain (pain score 1-3 or temp > 100.5). What changed:  how much to take when to take this reasons to take this   amLODipine 10 MG tablet Commonly known as: NORVASC Take 1/2 tablet (5 mg total) by mouth daily.   aspirin 325 MG tablet Take 1 tablet (325 mg total) by mouth daily.   bisacodyl 5 MG EC tablet Commonly known as: DULCOLAX Take 5 mg by mouth in the morning.   brimonidine 0.2 % ophthalmic solution Commonly known as: ALPHAGAN Place 1 drop into both eyes in the morning and at bedtime.   cefadroxil 500 MG capsule Commonly known as: DURICEF Take 2 capsules (1,000 mg total) by mouth 2 (two) times daily.   doxycycline 100 MG tablet Commonly known as: VIBRA-TABS Take 1 tablet (100 mg total) by mouth every 12 (twelve) hours.   feeding supplement Liqd Take 237 mLs by mouth 2 (two) times daily between meals. What changed:  when to take this reasons to take this   latanoprost 0.005 % ophthalmic solution Commonly known as: XALATAN Place 1 drop into both eyes at bedtime.   losartan 100 MG tablet Commonly known  as: COZAAR Take by mouth.   lovastatin 40 MG tablet Commonly known as: MEVACOR Take 40 mg by mouth daily with supper.   Melatonin 10 MG Tabs Take 40 mg by mouth at bedtime as needed (sleep).   meloxicam 7.5 MG tablet Commonly known as: Mobic Take 1 tablet (7.5 mg total) by mouth daily.   montelukast 10 MG tablet Commonly known as: SINGULAIR Take 10 mg by mouth at bedtime.   Mucinex 600 MG 12 hr tablet Generic drug: guaiFENesin Take 600  mg by mouth in the morning and at bedtime.   multivitamin with minerals tablet Take 1 tablet by mouth in the morning.   omeprazole 40 MG capsule Commonly known as: PRILOSEC Take 40 mg by mouth daily before breakfast.   oxyCODONE 5 MG immediate release tablet Commonly known as: Oxy IR/ROXICODONE Take 1 tablet (5 mg total) by mouth every 6 (six) hours as needed for severe pain (not controlled with Tylenol). What changed: reasons to take this   sertraline 25 MG tablet Commonly known as: ZOLOFT Take 25 mg by mouth in the morning.   Vitamin D-3 125 MCG (5000 UT) Tabs Take 5,000 Units by mouth in the morning.        Follow-up Information     [facility] Follow up.   Specialty: [place] Why: A representative from [facility] will contact you to arrange start date and time for your therapy. Contact information: [address] [place] [address] [phone]         Shona Needles, MD. Schedule an appointment as soon as possible for a visit in 2 day(s).   Specialty: Orthopedic Surgery Why: wound check and suture removal Contact information: [place] [address] [phone]                 Discharge Instructions and Plan: Patient will be discharged to home. Will be discharged on aspirin for DVT prophylaxis. Patient has been provided with all the necessary DME for discharge. Patient will follow up with Dr. Doreatha Martin in 2 weeks for repeat x-rays and suture removal.   Signed:  Gwinda Passe, PA-C ?[phone] (phone) 06/27/2022, 10:35 AM  [facility] [place] [address] [phone] Domingo Sep (F)

## 2022-06-28 LAB — AEROBIC/ANAEROBIC CULTURE W GRAM STAIN (SURGICAL/DEEP WOUND)
Culture: NO GROWTH
Culture: NO GROWTH
Gram Stain: NONE SEEN

## 2022-06-29 DIAGNOSIS — E785 Hyperlipidemia, unspecified: Secondary | ICD-10-CM | POA: Diagnosis not present

## 2022-06-29 DIAGNOSIS — H401130 Primary open-angle glaucoma, bilateral, stage unspecified: Secondary | ICD-10-CM | POA: Diagnosis not present

## 2022-06-29 DIAGNOSIS — I10 Essential (primary) hypertension: Secondary | ICD-10-CM | POA: Diagnosis not present

## 2022-06-29 DIAGNOSIS — E871 Hypo-osmolality and hyponatremia: Secondary | ICD-10-CM | POA: Diagnosis not present

## 2022-06-29 DIAGNOSIS — Z993 Dependence on wheelchair: Secondary | ICD-10-CM | POA: Diagnosis not present

## 2022-06-29 DIAGNOSIS — J849 Interstitial pulmonary disease, unspecified: Secondary | ICD-10-CM | POA: Diagnosis not present

## 2022-06-29 DIAGNOSIS — D649 Anemia, unspecified: Secondary | ICD-10-CM | POA: Diagnosis not present

## 2022-06-29 DIAGNOSIS — G4733 Obstructive sleep apnea (adult) (pediatric): Secondary | ICD-10-CM | POA: Diagnosis not present

## 2022-06-29 DIAGNOSIS — K219 Gastro-esophageal reflux disease without esophagitis: Secondary | ICD-10-CM | POA: Diagnosis not present

## 2022-06-29 DIAGNOSIS — K5900 Constipation, unspecified: Secondary | ICD-10-CM | POA: Diagnosis not present

## 2022-06-29 DIAGNOSIS — Z974 Presence of external hearing-aid: Secondary | ICD-10-CM | POA: Diagnosis not present

## 2022-06-29 DIAGNOSIS — M810 Age-related osteoporosis without current pathological fracture: Secondary | ICD-10-CM | POA: Diagnosis not present

## 2022-06-29 DIAGNOSIS — R69 Illness, unspecified: Secondary | ICD-10-CM | POA: Diagnosis not present

## 2022-06-29 DIAGNOSIS — Z7901 Long term (current) use of anticoagulants: Secondary | ICD-10-CM | POA: Diagnosis not present

## 2022-06-29 DIAGNOSIS — T84195D Other mechanical complication of internal fixation device of left femur, subsequent encounter: Secondary | ICD-10-CM | POA: Diagnosis not present

## 2022-06-29 DIAGNOSIS — Z9181 History of falling: Secondary | ICD-10-CM | POA: Diagnosis not present

## 2022-06-29 DIAGNOSIS — Z791 Long term (current) use of non-steroidal anti-inflammatories (NSAID): Secondary | ICD-10-CM | POA: Diagnosis not present

## 2022-07-03 DIAGNOSIS — K219 Gastro-esophageal reflux disease without esophagitis: Secondary | ICD-10-CM | POA: Diagnosis not present

## 2022-07-03 DIAGNOSIS — Z791 Long term (current) use of non-steroidal anti-inflammatories (NSAID): Secondary | ICD-10-CM | POA: Diagnosis not present

## 2022-07-03 DIAGNOSIS — D649 Anemia, unspecified: Secondary | ICD-10-CM | POA: Diagnosis not present

## 2022-07-03 DIAGNOSIS — Z7901 Long term (current) use of anticoagulants: Secondary | ICD-10-CM | POA: Diagnosis not present

## 2022-07-03 DIAGNOSIS — T84195D Other mechanical complication of internal fixation device of left femur, subsequent encounter: Secondary | ICD-10-CM | POA: Diagnosis not present

## 2022-07-03 DIAGNOSIS — H401130 Primary open-angle glaucoma, bilateral, stage unspecified: Secondary | ICD-10-CM | POA: Diagnosis not present

## 2022-07-03 DIAGNOSIS — M810 Age-related osteoporosis without current pathological fracture: Secondary | ICD-10-CM | POA: Diagnosis not present

## 2022-07-03 DIAGNOSIS — E871 Hypo-osmolality and hyponatremia: Secondary | ICD-10-CM | POA: Diagnosis not present

## 2022-07-03 DIAGNOSIS — R69 Illness, unspecified: Secondary | ICD-10-CM | POA: Diagnosis not present

## 2022-07-03 DIAGNOSIS — E785 Hyperlipidemia, unspecified: Secondary | ICD-10-CM | POA: Diagnosis not present

## 2022-07-03 DIAGNOSIS — Z974 Presence of external hearing-aid: Secondary | ICD-10-CM | POA: Diagnosis not present

## 2022-07-03 DIAGNOSIS — K5900 Constipation, unspecified: Secondary | ICD-10-CM | POA: Diagnosis not present

## 2022-07-03 DIAGNOSIS — I10 Essential (primary) hypertension: Secondary | ICD-10-CM | POA: Diagnosis not present

## 2022-07-03 DIAGNOSIS — J849 Interstitial pulmonary disease, unspecified: Secondary | ICD-10-CM | POA: Diagnosis not present

## 2022-07-03 DIAGNOSIS — Z993 Dependence on wheelchair: Secondary | ICD-10-CM | POA: Diagnosis not present

## 2022-07-03 DIAGNOSIS — G4733 Obstructive sleep apnea (adult) (pediatric): Secondary | ICD-10-CM | POA: Diagnosis not present

## 2022-07-03 DIAGNOSIS — Z9181 History of falling: Secondary | ICD-10-CM | POA: Diagnosis not present

## 2022-07-05 DIAGNOSIS — Z974 Presence of external hearing-aid: Secondary | ICD-10-CM | POA: Diagnosis not present

## 2022-07-05 DIAGNOSIS — I10 Essential (primary) hypertension: Secondary | ICD-10-CM | POA: Diagnosis not present

## 2022-07-05 DIAGNOSIS — K5900 Constipation, unspecified: Secondary | ICD-10-CM | POA: Diagnosis not present

## 2022-07-05 DIAGNOSIS — Z7901 Long term (current) use of anticoagulants: Secondary | ICD-10-CM | POA: Diagnosis not present

## 2022-07-05 DIAGNOSIS — H401130 Primary open-angle glaucoma, bilateral, stage unspecified: Secondary | ICD-10-CM | POA: Diagnosis not present

## 2022-07-05 DIAGNOSIS — E785 Hyperlipidemia, unspecified: Secondary | ICD-10-CM | POA: Diagnosis not present

## 2022-07-05 DIAGNOSIS — K219 Gastro-esophageal reflux disease without esophagitis: Secondary | ICD-10-CM | POA: Diagnosis not present

## 2022-07-05 DIAGNOSIS — J849 Interstitial pulmonary disease, unspecified: Secondary | ICD-10-CM | POA: Diagnosis not present

## 2022-07-05 DIAGNOSIS — Z9181 History of falling: Secondary | ICD-10-CM | POA: Diagnosis not present

## 2022-07-05 DIAGNOSIS — M810 Age-related osteoporosis without current pathological fracture: Secondary | ICD-10-CM | POA: Diagnosis not present

## 2022-07-05 DIAGNOSIS — T84195D Other mechanical complication of internal fixation device of left femur, subsequent encounter: Secondary | ICD-10-CM | POA: Diagnosis not present

## 2022-07-05 DIAGNOSIS — E871 Hypo-osmolality and hyponatremia: Secondary | ICD-10-CM | POA: Diagnosis not present

## 2022-07-05 DIAGNOSIS — Z993 Dependence on wheelchair: Secondary | ICD-10-CM | POA: Diagnosis not present

## 2022-07-05 DIAGNOSIS — G4733 Obstructive sleep apnea (adult) (pediatric): Secondary | ICD-10-CM | POA: Diagnosis not present

## 2022-07-05 DIAGNOSIS — Z791 Long term (current) use of non-steroidal anti-inflammatories (NSAID): Secondary | ICD-10-CM | POA: Diagnosis not present

## 2022-07-05 DIAGNOSIS — D649 Anemia, unspecified: Secondary | ICD-10-CM | POA: Diagnosis not present

## 2022-07-05 DIAGNOSIS — R69 Illness, unspecified: Secondary | ICD-10-CM | POA: Diagnosis not present

## 2022-07-06 DIAGNOSIS — S72142D Displaced intertrochanteric fracture of left femur, subsequent encounter for closed fracture with routine healing: Secondary | ICD-10-CM | POA: Diagnosis not present

## 2022-07-08 DIAGNOSIS — T84195D Other mechanical complication of internal fixation device of left femur, subsequent encounter: Secondary | ICD-10-CM | POA: Diagnosis not present

## 2022-07-08 DIAGNOSIS — E871 Hypo-osmolality and hyponatremia: Secondary | ICD-10-CM | POA: Diagnosis not present

## 2022-07-08 DIAGNOSIS — D649 Anemia, unspecified: Secondary | ICD-10-CM | POA: Diagnosis not present

## 2022-07-08 DIAGNOSIS — I10 Essential (primary) hypertension: Secondary | ICD-10-CM | POA: Diagnosis not present

## 2022-07-08 DIAGNOSIS — H401130 Primary open-angle glaucoma, bilateral, stage unspecified: Secondary | ICD-10-CM | POA: Diagnosis not present

## 2022-07-08 DIAGNOSIS — M810 Age-related osteoporosis without current pathological fracture: Secondary | ICD-10-CM | POA: Diagnosis not present

## 2022-07-08 DIAGNOSIS — K219 Gastro-esophageal reflux disease without esophagitis: Secondary | ICD-10-CM | POA: Diagnosis not present

## 2022-07-08 DIAGNOSIS — J849 Interstitial pulmonary disease, unspecified: Secondary | ICD-10-CM | POA: Diagnosis not present

## 2022-07-08 DIAGNOSIS — R69 Illness, unspecified: Secondary | ICD-10-CM | POA: Diagnosis not present

## 2022-07-08 DIAGNOSIS — Z791 Long term (current) use of non-steroidal anti-inflammatories (NSAID): Secondary | ICD-10-CM | POA: Diagnosis not present

## 2022-07-08 DIAGNOSIS — Z993 Dependence on wheelchair: Secondary | ICD-10-CM | POA: Diagnosis not present

## 2022-07-08 DIAGNOSIS — Z9181 History of falling: Secondary | ICD-10-CM | POA: Diagnosis not present

## 2022-07-08 DIAGNOSIS — Z7901 Long term (current) use of anticoagulants: Secondary | ICD-10-CM | POA: Diagnosis not present

## 2022-07-08 DIAGNOSIS — Z974 Presence of external hearing-aid: Secondary | ICD-10-CM | POA: Diagnosis not present

## 2022-07-08 DIAGNOSIS — E785 Hyperlipidemia, unspecified: Secondary | ICD-10-CM | POA: Diagnosis not present

## 2022-07-08 DIAGNOSIS — G4733 Obstructive sleep apnea (adult) (pediatric): Secondary | ICD-10-CM | POA: Diagnosis not present

## 2022-07-08 DIAGNOSIS — K5900 Constipation, unspecified: Secondary | ICD-10-CM | POA: Diagnosis not present

## 2022-07-12 DIAGNOSIS — Z7901 Long term (current) use of anticoagulants: Secondary | ICD-10-CM | POA: Diagnosis not present

## 2022-07-12 DIAGNOSIS — H401130 Primary open-angle glaucoma, bilateral, stage unspecified: Secondary | ICD-10-CM | POA: Diagnosis not present

## 2022-07-12 DIAGNOSIS — Z993 Dependence on wheelchair: Secondary | ICD-10-CM | POA: Diagnosis not present

## 2022-07-12 DIAGNOSIS — M6281 Muscle weakness (generalized): Secondary | ICD-10-CM | POA: Diagnosis not present

## 2022-07-12 DIAGNOSIS — K5900 Constipation, unspecified: Secondary | ICD-10-CM | POA: Diagnosis not present

## 2022-07-12 DIAGNOSIS — E871 Hypo-osmolality and hyponatremia: Secondary | ICD-10-CM | POA: Diagnosis not present

## 2022-07-12 DIAGNOSIS — Z974 Presence of external hearing-aid: Secondary | ICD-10-CM | POA: Diagnosis not present

## 2022-07-12 DIAGNOSIS — M810 Age-related osteoporosis without current pathological fracture: Secondary | ICD-10-CM | POA: Diagnosis not present

## 2022-07-12 DIAGNOSIS — G4733 Obstructive sleep apnea (adult) (pediatric): Secondary | ICD-10-CM | POA: Diagnosis not present

## 2022-07-12 DIAGNOSIS — D649 Anemia, unspecified: Secondary | ICD-10-CM | POA: Diagnosis not present

## 2022-07-12 DIAGNOSIS — Z791 Long term (current) use of non-steroidal anti-inflammatories (NSAID): Secondary | ICD-10-CM | POA: Diagnosis not present

## 2022-07-12 DIAGNOSIS — I10 Essential (primary) hypertension: Secondary | ICD-10-CM | POA: Diagnosis not present

## 2022-07-12 DIAGNOSIS — R69 Illness, unspecified: Secondary | ICD-10-CM | POA: Diagnosis not present

## 2022-07-12 DIAGNOSIS — K219 Gastro-esophageal reflux disease without esophagitis: Secondary | ICD-10-CM | POA: Diagnosis not present

## 2022-07-12 DIAGNOSIS — Z9181 History of falling: Secondary | ICD-10-CM | POA: Diagnosis not present

## 2022-07-12 DIAGNOSIS — J849 Interstitial pulmonary disease, unspecified: Secondary | ICD-10-CM | POA: Diagnosis not present

## 2022-07-12 DIAGNOSIS — T84195D Other mechanical complication of internal fixation device of left femur, subsequent encounter: Secondary | ICD-10-CM | POA: Diagnosis not present

## 2022-07-12 DIAGNOSIS — E785 Hyperlipidemia, unspecified: Secondary | ICD-10-CM | POA: Diagnosis not present

## 2022-07-13 ENCOUNTER — Ambulatory Visit (INDEPENDENT_AMBULATORY_CARE_PROVIDER_SITE_OTHER): Payer: Medicare HMO | Admitting: Family Medicine

## 2022-07-13 ENCOUNTER — Encounter: Payer: Self-pay | Admitting: Family Medicine

## 2022-07-13 VITALS — BP 100/73 | HR 109 | Temp 97.9°F | Resp 16 | Wt 152.6 lb

## 2022-07-13 DIAGNOSIS — R5383 Other fatigue: Secondary | ICD-10-CM | POA: Diagnosis not present

## 2022-07-13 DIAGNOSIS — F3289 Other specified depressive episodes: Secondary | ICD-10-CM

## 2022-07-13 DIAGNOSIS — F5102 Adjustment insomnia: Secondary | ICD-10-CM | POA: Diagnosis not present

## 2022-07-13 DIAGNOSIS — R0689 Other abnormalities of breathing: Secondary | ICD-10-CM | POA: Diagnosis not present

## 2022-07-13 DIAGNOSIS — R69 Illness, unspecified: Secondary | ICD-10-CM | POA: Diagnosis not present

## 2022-07-13 DIAGNOSIS — R0600 Dyspnea, unspecified: Secondary | ICD-10-CM

## 2022-07-13 MED ORDER — SERTRALINE HCL 50 MG PO TABS: 50.0000 mg | ORAL_TABLET | Freq: Every day | ORAL | 1 refills | Status: DC

## 2022-07-13 MED ORDER — TRAZODONE HCL 50 MG PO TABS: 25.0000 mg | ORAL_TABLET | Freq: Every evening | ORAL | 0 refills | Status: DC | PRN

## 2022-07-13 NOTE — Patient Instructions (Addendum)
I have added Trazodone 50-'100mg'$  for help with sleeping   We Will increase Zoloft to '50mg'$     We will collect blood work today to test for different causes of anemia   Please follow up with me in the next 2-4 weeks

## 2022-07-13 NOTE — Progress Notes (Signed)
I,Melanie Carrillo,acting as a scribe for [employer], MD.,have documented all relevant documentation on the behalf of Melanie Foster, MD,as directed by  Melanie Foster, MD while in the presence of Melanie Foster, MD.  New patient visit   Patient: Melanie Carrillo   DOB: December 03, 1934   87 y.o. Female  MRN: [id number] Visit Date: 07/13/2022  Today's healthcare provider: [name], MD   Chief Complaint  Patient presents with   Establish Care   Fatigue   Subjective    Melanie Carrillo is a 87 y.o. female who presents today as a new patient to establish care.   HPI   She lives with Melanie Carrillo, daughter who has been helping patient around the home since recurrent issues in May with orthopedics in May   Daughter works as Utilization review nurse Investment banker, corporate) for [employer] and works from home and prepares meals for the patient "all day, every day"  She was put on Trazodone to help with sleep when moving to [place] with her daughter   She has been having depression   In the last few days has decreased energy, decreased motivation   They were seen in [place] previously prior to move   Since may health has declined with back to back treatments fracture   She reports having fatigue and spells of sadness for 5-6 months   Patient had been doing well with rehab and then when rod broke and she had to return to PT/OT and hospitalization, it decreased her mood   She has not quite returned to baseline   Trazodone helped with sleep   Currently holding amlodipine due to recent hypotension   They report discrepancy between upper extremities with low vs elevated BP (right ue is often elevated)   Patient denies pulsatile tinnitus   She has hx of arthrtis that sometimes involves the neck   She does report dyspnea with short distances   Is currently working with [facility] PT twice per week    OSA: she cannot tolerate sleep apnea machine, daughter  states that she sleep about 3 hours before needing to urinate and then takes several hours to return to sleep and she often has to read, she has been adherent to trazodone every night since it was prescribed   Patient did require transfusion of 1 unit of pRBCs prior to discharge from the first surgery in May  No hx of prior blood transfusions   Patient and daughter report drinking energy for several years  Patient also drinks 1 cup of coffee daily in the mornings    She reports drinking water all day and throughout the night      Past Medical History:  Diagnosis Date   Allergy    Anxiety    Arthritis    Cataract    Colon cancer ([place])    Constipation    Dyspnea    GERD (gastroesophageal reflux disease)    Glaucoma    HLD (hyperlipidemia)    Hypertension    Obstructive sleep apnea 01/29/2014   Osteoporosis    Substance abuse ([place])    Past Surgical History:  Procedure Laterality Date   APPENDECTOMY     CARDIAC CATHETERIZATION     COLON SURGERY     COLONOSCOPY     EYE SURGERY     cataracts   FEMUR IM NAIL Left 04/28/2022   Procedure: REVISION FIXATION OF LEFT FEMUR FRACTURE;  Surgeon: Shona Needles, MD;  Location: [place];  Service: Orthopedics;  Laterality: Left;   FRACTURE SURGERY     HARDWARE REMOVAL Left 04/28/2022   Procedure: HARDWARE REMOVAL;  Surgeon: Shona Needles, MD;  Location: [facility];  Service: Orthopedics;  Laterality: Left;   HERNIA REPAIR     I & D EXTREMITY Left 06/23/2022   Procedure: IRRIGATION AND DEBRIDEMENT LEFT HIP;  Surgeon: Shona Needles, MD;  Location: [facility];  Service: Orthopedics;  Laterality: Left;   INTRAMEDULLARY (IM) NAIL INTERTROCHANTERIC Left 12/26/2021   Procedure: INTRAMEDULLARY (IM) NAIL INTERTROCHANTRIC;  Surgeon: Renee Harder, MD;  Location: [facility];  Service: Orthopedics;  Laterality: Left;   MULTIPLE TOOTH EXTRACTIONS     full dentures   shoulder  surgery Left    TONSILLECTOMY     TUBAL LIGATION     UPPER GI ENDOSCOPY      WRIST SURGERY Right    Family Status  Relation Name Status   Mother  (Not Specified)   Father  (Not Specified)   Family History  Problem Relation Age of Onset   Hypertension Mother    Heart disease Mother    Heart disease Father    Social History   Socioeconomic History   Marital status: Widowed    Spouse name: Not on file   Number of children: Not on file   Years of education: Not on file   Highest education level: Not on file  Occupational History   Not on file  Tobacco Use   Smoking status: Never   Smokeless tobacco: Never  Vaping Use   Vaping Use: Never used  Substance and Sexual Activity   Alcohol use: Never   Drug use: Never   Sexual activity: Not Currently    Birth control/protection: Post-menopausal  Other Topics Concern   Not on file  Social History Narrative   Not on file   Social Determinants of Health   Financial Resource Strain: Not on file  Food Insecurity: No Food Insecurity (06/24/2022)   Hunger Vital Sign    Worried About Running Out of Food in the Last Year: Never true    Ran Out of Food in the Last Year: Never true  Transportation Needs: No Transportation Needs (06/24/2022)   PRAPARE - Hydrologist (Medical): No    Lack of Transportation (Non-Medical): No  Physical Activity: Not on file  Stress: Not on file  Social Connections: Not on file   Outpatient Medications Prior to Visit  Medication Sig   [DISCONTINUED] sertraline (ZOLOFT) 25 MG tablet Take 25 mg by mouth in the morning.   acetaminophen (TYLENOL) 325 MG tablet Take 1-2 tablets (325-650 mg total) by mouth every 6 (six) hours as needed for mild pain (pain score 1-3 or temp > 100.5). (Patient taking differently: Take 650-1,300 mg by mouth as needed (pain).)   amLODipine (NORVASC) 10 MG tablet Take 1/2 tablet (5 mg total) by mouth daily.   aspirin 325 MG tablet Take 1 tablet (325 mg total) by mouth daily.   bisacodyl (DULCOLAX) 5 MG EC tablet Take 5 mg by  mouth in the morning.   brimonidine (ALPHAGAN) 0.2 % ophthalmic solution Place 1 drop into both eyes in the morning and at bedtime.   cefadroxil (DURICEF) 500 MG capsule Take 2 capsules (1,000 mg total) by mouth 2 (two) times daily.   Cholecalciferol (VITAMIN D-3) 125 MCG (5000 UT) TABS Take 5,000 Units by mouth in the morning.   doxycycline (VIBRA-TABS) 100 MG tablet Take 1 tablet (100 mg total) by mouth  every 12 (twelve) hours.   feeding supplement (ENSURE ENLIVE / ENSURE PLUS) LIQD Take 237 mLs by mouth 2 (two) times daily between meals. (Patient taking differently: Take 237 mLs by mouth daily as needed (nutrition supplement).)   guaiFENesin (MUCINEX) 600 MG 12 hr tablet Take 600 mg by mouth in the morning and at bedtime.   latanoprost (XALATAN) 0.005 % ophthalmic solution Place 1 drop into both eyes at bedtime.   losartan (COZAAR) 100 MG tablet Take by mouth.   lovastatin (MEVACOR) 40 MG tablet Take 40 mg by mouth daily with supper.   Melatonin 10 MG TABS Take 40 mg by mouth at bedtime as needed (sleep).   meloxicam (MOBIC) 7.5 MG tablet Take 1 tablet (7.5 mg total) by mouth daily.   montelukast (SINGULAIR) 10 MG tablet Take 10 mg by mouth at bedtime.   Multiple Vitamins-Minerals (MULTIVITAMIN WITH MINERALS) tablet Take 1 tablet by mouth in the morning.   omeprazole (PRILOSEC) 40 MG capsule Take 40 mg by mouth daily before breakfast.   oxyCODONE (OXY IR/ROXICODONE) 5 MG immediate release tablet Take 1 tablet (5 mg total) by mouth every 6 (six) hours as needed for severe pain (not controlled with Tylenol).   No facility-administered medications prior to visit.   No Known Allergies  Immunization History  Administered Date(s) Administered   Moderna Sars-Covid-2 Vaccination 03/11/2021    Health Maintenance  Topic Date Due   Zoster Vaccines- Shingrix (1 of 2) Never done   Pneumonia Vaccine 50+ Years old (1 - PCV) Never done   DEXA SCAN  Never done   COVID-19 Vaccine (5 - 2023-24 season)  04/23/2022   Medicare Annual Wellness (AWV)  11/24/2022   INFLUENZA VACCINE  Completed   HPV VACCINES  Aged Out    Patient Care Team: Melanie Foster, MD as PCP - General (Family Medicine)  Review of Systems  Constitutional:  Positive for fatigue.  HENT:  Positive for rhinorrhea and sneezing.   Respiratory:  Positive for cough.   Psychiatric/Behavioral:  Positive for sleep disturbance.        Objective    BP 100/73 (BP Location: Left Arm, Patient Position: Sitting, Cuff Size: Normal)   Pulse (!) 109   Temp 97.9 F (36.6 C) (Oral)   Resp 16   Wt 152 lb 9.6 oz (69.2 kg)   LMP  (LMP Unknown)   SpO2 97%   BMI 27.03 kg/m    Physical Exam Constitutional:      General: She is not in acute distress.    Appearance: Normal appearance. She is normal weight. She is not ill-appearing, toxic-appearing or diaphoretic.     Comments: Well groomed, calmly sitting in exam rooom  Cardiovascular:     Rate and Rhythm: Regular rhythm. Tachycardia present.     Pulses: Normal pulses.     Heart sounds: Normal heart sounds. No murmur heard.    No friction rub.  Pulmonary:     Effort: Pulmonary effort is normal. No respiratory distress.     Breath sounds: Rales present. No wheezing or rhonchi.     Comments: Fine crackles at bilateral bases  Neurological:     Mental Status: She is alert.  Psychiatric:        Attention and Perception: Attention and perception normal. She is attentive. She does not perceive auditory or visual hallucinations.        Mood and Affect: Mood and affect normal.        Speech: Speech normal.  Behavior: Behavior normal. Behavior is cooperative.        Thought Content: Thought content normal. Thought content is not paranoid or delusional. Thought content does not include homicidal or suicidal ideation. Thought content does not include homicidal or suicidal plan.        Judgment: Judgment normal.     Depression Screen    07/13/2022    2:30 PM   PHQ 2/9 Scores  PHQ - 2 Score 1  PHQ- 9 Score 6   Results for orders placed or performed in visit on 07/13/22  Comprehensive metabolic panel  Result Value Ref Range   Glucose 100 (H) 70 - 99 mg/dL   BUN 19 8 - 27 mg/dL   Creatinine, Ser 0.62 0.57 - 1.00 mg/dL   eGFR 87 >59 mL/min/1.73   BUN/Creatinine Ratio 31 (H) 12 - 28   Sodium 134 134 - 144 mmol/L   Potassium 5.0 3.5 - 5.2 mmol/L   Chloride 99 96 - 106 mmol/L   CO2 22 20 - 29 mmol/L   Calcium 10.5 (H) 8.7 - 10.3 mg/dL   Total Protein 7.0 6.0 - 8.5 g/dL   Albumin 3.7 3.7 - 4.7 g/dL   Globulin, Total 3.3 1.5 - 4.5 g/dL   Albumin/Globulin Ratio 1.1 (L) 1.2 - 2.2   Bilirubin Total 0.2 0.0 - 1.2 mg/dL   Alkaline Phosphatase 118 44 - 121 IU/L   AST 20 0 - 40 IU/L   ALT 11 0 - 32 IU/L  CBC w/Diff/Platelet  Result Value Ref Range   WBC 7.3 3.4 - 10.8 x10E3/uL   RBC 3.66 (L) 3.77 - 5.28 x10E6/uL   Hemoglobin 11.5 11.1 - 15.9 g/dL   Hematocrit 34.9 34.0 - 46.6 %   MCV 95 79 - 97 fL   MCH 31.4 26.6 - 33.0 pg   MCHC 33.0 31.5 - 35.7 g/dL   RDW 14.1 11.7 - 15.4 %   Platelets 316 150 - 450 x10E3/uL   Neutrophils 63 Not Estab. %   Lymphs 25 Not Estab. %   Monocytes 9 Not Estab. %   Eos 2 Not Estab. %   Basos 1 Not Estab. %   Neutrophils Absolute 4.6 1.4 - 7.0 x10E3/uL   Lymphocytes Absolute 1.9 0.7 - 3.1 x10E3/uL   Monocytes Absolute 0.7 0.1 - 0.9 x10E3/uL   EOS (ABSOLUTE) 0.1 0.0 - 0.4 x10E3/uL   Basophils Absolute 0.0 0.0 - 0.2 x10E3/uL   Immature Granulocytes 0 Not Estab. %   Immature Grans (Abs) 0.0 0.0 - 0.1 x10E3/uL  TSH  Result Value Ref Range   TSH 2.360 0.450 - 4.500 uIU/mL  Hemoglobin A1c  Result Value Ref Range   Hgb A1c MFr Bld 5.4 4.8 - 5.6 %   Est. average glucose Bld gHb Est-mCnc 108 mg/dL    Assessment & Plan      Problem List Items Addressed This Visit       Other   Depression    Increased Zoloft to 52m daily       Relevant Medications   sertraline (ZOLOFT) 50 MG tablet   Adjustment  insomnia    Increased trazodone       Relevant Orders   TSH (Completed)   Dyspnea and respiratory abnormality    TSH CBC       Relevant Orders   Comprehensive metabolic panel (Completed)   CBC w/Diff/Platelet (Completed)   Other fatigue - Primary    Cbc TSH       Relevant Orders  Comprehensive metabolic panel (Completed)   CBC w/Diff/Platelet (Completed)   TSH (Completed)   Hemoglobin A1c (Completed)     Return in about 3 weeks (around 08/03/2022) for mood check, med f/u .     I, Melanie Foster, MD, have reviewed all documentation for this visit.  Portions of this information were initially documented by the CMA and reviewed by me for thoroughness and accuracy.     Melanie Foster, MD  [facility] [phone] (phone) [phone] (fax)  [place]

## 2022-07-14 DIAGNOSIS — R69 Illness, unspecified: Secondary | ICD-10-CM | POA: Diagnosis not present

## 2022-07-14 DIAGNOSIS — G4733 Obstructive sleep apnea (adult) (pediatric): Secondary | ICD-10-CM | POA: Diagnosis not present

## 2022-07-14 DIAGNOSIS — Z993 Dependence on wheelchair: Secondary | ICD-10-CM | POA: Diagnosis not present

## 2022-07-14 DIAGNOSIS — Z7901 Long term (current) use of anticoagulants: Secondary | ICD-10-CM | POA: Diagnosis not present

## 2022-07-14 DIAGNOSIS — M810 Age-related osteoporosis without current pathological fracture: Secondary | ICD-10-CM | POA: Diagnosis not present

## 2022-07-14 DIAGNOSIS — E785 Hyperlipidemia, unspecified: Secondary | ICD-10-CM | POA: Diagnosis not present

## 2022-07-14 DIAGNOSIS — H401130 Primary open-angle glaucoma, bilateral, stage unspecified: Secondary | ICD-10-CM | POA: Diagnosis not present

## 2022-07-14 DIAGNOSIS — Z9181 History of falling: Secondary | ICD-10-CM | POA: Diagnosis not present

## 2022-07-14 DIAGNOSIS — K5900 Constipation, unspecified: Secondary | ICD-10-CM | POA: Diagnosis not present

## 2022-07-14 DIAGNOSIS — E871 Hypo-osmolality and hyponatremia: Secondary | ICD-10-CM | POA: Diagnosis not present

## 2022-07-14 DIAGNOSIS — Z974 Presence of external hearing-aid: Secondary | ICD-10-CM | POA: Diagnosis not present

## 2022-07-14 DIAGNOSIS — I10 Essential (primary) hypertension: Secondary | ICD-10-CM | POA: Diagnosis not present

## 2022-07-14 DIAGNOSIS — K219 Gastro-esophageal reflux disease without esophagitis: Secondary | ICD-10-CM | POA: Diagnosis not present

## 2022-07-14 DIAGNOSIS — D649 Anemia, unspecified: Secondary | ICD-10-CM | POA: Diagnosis not present

## 2022-07-14 DIAGNOSIS — Z791 Long term (current) use of non-steroidal anti-inflammatories (NSAID): Secondary | ICD-10-CM | POA: Diagnosis not present

## 2022-07-14 DIAGNOSIS — T84195D Other mechanical complication of internal fixation device of left femur, subsequent encounter: Secondary | ICD-10-CM | POA: Diagnosis not present

## 2022-07-14 DIAGNOSIS — J849 Interstitial pulmonary disease, unspecified: Secondary | ICD-10-CM | POA: Diagnosis not present

## 2022-07-14 LAB — CBC WITH DIFFERENTIAL/PLATELET
Basophils Absolute: 0 10*3/uL (ref 0.0–0.2)
Basos: 1 %
EOS (ABSOLUTE): 0.1 10*3/uL (ref 0.0–0.4)
Eos: 2 %
Hematocrit: 34.9 % (ref 34.0–46.6)
Hemoglobin: 11.5 g/dL (ref 11.1–15.9)
Immature Grans (Abs): 0 10*3/uL (ref 0.0–0.1)
Immature Granulocytes: 0 %
Lymphocytes Absolute: 1.9 10*3/uL (ref 0.7–3.1)
Lymphs: 25 %
MCH: 31.4 pg (ref 26.6–33.0)
MCHC: 33 g/dL (ref 31.5–35.7)
MCV: 95 fL (ref 79–97)
Monocytes Absolute: 0.7 10*3/uL (ref 0.1–0.9)
Monocytes: 9 %
Neutrophils Absolute: 4.6 10*3/uL (ref 1.4–7.0)
Neutrophils: 63 %
Platelets: 316 10*3/uL (ref 150–450)
RBC: 3.66 x10E6/uL — ABNORMAL LOW (ref 3.77–5.28)
RDW: 14.1 % (ref 11.7–15.4)
WBC: 7.3 10*3/uL (ref 3.4–10.8)

## 2022-07-14 LAB — COMPREHENSIVE METABOLIC PANEL
ALT: 11 IU/L (ref 0–32)
AST: 20 IU/L (ref 0–40)
Albumin/Globulin Ratio: 1.1 — ABNORMAL LOW (ref 1.2–2.2)
Albumin: 3.7 g/dL (ref 3.7–4.7)
Alkaline Phosphatase: 118 IU/L (ref 44–121)
BUN/Creatinine Ratio: 31 — ABNORMAL HIGH (ref 12–28)
BUN: 19 mg/dL (ref 8–27)
Bilirubin Total: 0.2 mg/dL (ref 0.0–1.2)
CO2: 22 mmol/L (ref 20–29)
Calcium: 10.5 mg/dL — ABNORMAL HIGH (ref 8.7–10.3)
Chloride: 99 mmol/L (ref 96–106)
Creatinine, Ser: 0.62 mg/dL (ref 0.57–1.00)
Globulin, Total: 3.3 g/dL (ref 1.5–4.5)
Glucose: 100 mg/dL — ABNORMAL HIGH (ref 70–99)
Potassium: 5 mmol/L (ref 3.5–5.2)
Sodium: 134 mmol/L (ref 134–144)
Total Protein: 7 g/dL (ref 6.0–8.5)
eGFR: 87 mL/min/{1.73_m2} (ref >59)

## 2022-07-14 LAB — HEMOGLOBIN A1C
Est. average glucose Bld gHb Est-mCnc: 108 mg/dL
Hgb A1c MFr Bld: 5.4 % (ref 4.8–5.6)

## 2022-07-14 LAB — TSH: TSH: 2.36 u[IU]/mL (ref 0.450–4.500)

## 2022-07-19 ENCOUNTER — Inpatient Hospital Stay: Payer: Medicare HMO | Admitting: Internal Medicine

## 2022-07-19 DIAGNOSIS — I10 Essential (primary) hypertension: Secondary | ICD-10-CM | POA: Diagnosis not present

## 2022-07-19 DIAGNOSIS — H401130 Primary open-angle glaucoma, bilateral, stage unspecified: Secondary | ICD-10-CM | POA: Diagnosis not present

## 2022-07-19 DIAGNOSIS — Z974 Presence of external hearing-aid: Secondary | ICD-10-CM | POA: Diagnosis not present

## 2022-07-19 DIAGNOSIS — Z7901 Long term (current) use of anticoagulants: Secondary | ICD-10-CM | POA: Diagnosis not present

## 2022-07-19 DIAGNOSIS — G4733 Obstructive sleep apnea (adult) (pediatric): Secondary | ICD-10-CM | POA: Diagnosis not present

## 2022-07-19 DIAGNOSIS — E871 Hypo-osmolality and hyponatremia: Secondary | ICD-10-CM | POA: Diagnosis not present

## 2022-07-19 DIAGNOSIS — R69 Illness, unspecified: Secondary | ICD-10-CM | POA: Diagnosis not present

## 2022-07-19 DIAGNOSIS — Z9181 History of falling: Secondary | ICD-10-CM | POA: Diagnosis not present

## 2022-07-19 DIAGNOSIS — Z791 Long term (current) use of non-steroidal anti-inflammatories (NSAID): Secondary | ICD-10-CM | POA: Diagnosis not present

## 2022-07-19 DIAGNOSIS — E785 Hyperlipidemia, unspecified: Secondary | ICD-10-CM | POA: Diagnosis not present

## 2022-07-19 DIAGNOSIS — D649 Anemia, unspecified: Secondary | ICD-10-CM | POA: Diagnosis not present

## 2022-07-19 DIAGNOSIS — Z993 Dependence on wheelchair: Secondary | ICD-10-CM | POA: Diagnosis not present

## 2022-07-19 DIAGNOSIS — M810 Age-related osteoporosis without current pathological fracture: Secondary | ICD-10-CM | POA: Diagnosis not present

## 2022-07-19 DIAGNOSIS — K219 Gastro-esophageal reflux disease without esophagitis: Secondary | ICD-10-CM | POA: Diagnosis not present

## 2022-07-19 DIAGNOSIS — J849 Interstitial pulmonary disease, unspecified: Secondary | ICD-10-CM | POA: Diagnosis not present

## 2022-07-19 DIAGNOSIS — K5900 Constipation, unspecified: Secondary | ICD-10-CM | POA: Diagnosis not present

## 2022-07-19 DIAGNOSIS — T84195D Other mechanical complication of internal fixation device of left femur, subsequent encounter: Secondary | ICD-10-CM | POA: Diagnosis not present

## 2022-07-21 DIAGNOSIS — R0600 Dyspnea, unspecified: Secondary | ICD-10-CM | POA: Insufficient documentation

## 2022-07-21 DIAGNOSIS — R5383 Other fatigue: Secondary | ICD-10-CM | POA: Insufficient documentation

## 2022-07-21 DIAGNOSIS — F5102 Adjustment insomnia: Secondary | ICD-10-CM | POA: Insufficient documentation

## 2022-07-21 NOTE — Assessment & Plan Note (Signed)
Increased trazodone

## 2022-07-21 NOTE — Assessment & Plan Note (Signed)
Cbc TSH

## 2022-07-21 NOTE — Assessment & Plan Note (Signed)
Increased Zoloft to '50mg'$  daily

## 2022-07-21 NOTE — Assessment & Plan Note (Signed)
TSH CBC

## 2022-07-22 DIAGNOSIS — Z974 Presence of external hearing-aid: Secondary | ICD-10-CM | POA: Diagnosis not present

## 2022-07-22 DIAGNOSIS — R69 Illness, unspecified: Secondary | ICD-10-CM | POA: Diagnosis not present

## 2022-07-22 DIAGNOSIS — K219 Gastro-esophageal reflux disease without esophagitis: Secondary | ICD-10-CM | POA: Diagnosis not present

## 2022-07-22 DIAGNOSIS — T84195D Other mechanical complication of internal fixation device of left femur, subsequent encounter: Secondary | ICD-10-CM | POA: Diagnosis not present

## 2022-07-22 DIAGNOSIS — I10 Essential (primary) hypertension: Secondary | ICD-10-CM | POA: Diagnosis not present

## 2022-07-22 DIAGNOSIS — Z9181 History of falling: Secondary | ICD-10-CM | POA: Diagnosis not present

## 2022-07-22 DIAGNOSIS — G4733 Obstructive sleep apnea (adult) (pediatric): Secondary | ICD-10-CM | POA: Diagnosis not present

## 2022-07-22 DIAGNOSIS — J849 Interstitial pulmonary disease, unspecified: Secondary | ICD-10-CM | POA: Diagnosis not present

## 2022-07-22 DIAGNOSIS — E871 Hypo-osmolality and hyponatremia: Secondary | ICD-10-CM | POA: Diagnosis not present

## 2022-07-22 DIAGNOSIS — Z993 Dependence on wheelchair: Secondary | ICD-10-CM | POA: Diagnosis not present

## 2022-07-22 DIAGNOSIS — E785 Hyperlipidemia, unspecified: Secondary | ICD-10-CM | POA: Diagnosis not present

## 2022-07-22 DIAGNOSIS — H401130 Primary open-angle glaucoma, bilateral, stage unspecified: Secondary | ICD-10-CM | POA: Diagnosis not present

## 2022-07-22 DIAGNOSIS — Z7901 Long term (current) use of anticoagulants: Secondary | ICD-10-CM | POA: Diagnosis not present

## 2022-07-22 DIAGNOSIS — K5900 Constipation, unspecified: Secondary | ICD-10-CM | POA: Diagnosis not present

## 2022-07-22 DIAGNOSIS — D649 Anemia, unspecified: Secondary | ICD-10-CM | POA: Diagnosis not present

## 2022-07-22 DIAGNOSIS — Z791 Long term (current) use of non-steroidal anti-inflammatories (NSAID): Secondary | ICD-10-CM | POA: Diagnosis not present

## 2022-07-22 DIAGNOSIS — M810 Age-related osteoporosis without current pathological fracture: Secondary | ICD-10-CM | POA: Diagnosis not present

## 2022-07-23 ENCOUNTER — Ambulatory Visit (INDEPENDENT_AMBULATORY_CARE_PROVIDER_SITE_OTHER): Payer: Medicare HMO | Admitting: Internal Medicine

## 2022-07-23 ENCOUNTER — Other Ambulatory Visit: Payer: Self-pay

## 2022-07-23 ENCOUNTER — Encounter: Payer: Self-pay | Admitting: Internal Medicine

## 2022-07-23 ENCOUNTER — Ambulatory Visit: Payer: Medicare HMO

## 2022-07-23 VITALS — BP 124/81 | HR 86 | Temp 97.8°F

## 2022-07-23 DIAGNOSIS — Z96642 Presence of left artificial hip joint: Secondary | ICD-10-CM | POA: Diagnosis not present

## 2022-07-23 DIAGNOSIS — L089 Local infection of the skin and subcutaneous tissue, unspecified: Secondary | ICD-10-CM

## 2022-07-23 DIAGNOSIS — T148XXA Other injury of unspecified body region, initial encounter: Secondary | ICD-10-CM

## 2022-07-23 DIAGNOSIS — S72002A Fracture of unspecified part of neck of left femur, initial encounter for closed fracture: Secondary | ICD-10-CM | POA: Diagnosis not present

## 2022-07-23 DIAGNOSIS — T84199A Other mechanical complication of internal fixation device of unspecified bone of limb, initial encounter: Secondary | ICD-10-CM | POA: Diagnosis not present

## 2022-07-23 MED ORDER — DOXYCYCLINE HYCLATE 100 MG PO TABS: 100.0000 mg | ORAL_TABLET | Freq: Two times a day (BID) | ORAL | 0 refills | Status: DC

## 2022-07-23 MED ORDER — CEFADROXIL 500 MG PO CAPS: 1000.0000 mg | ORAL_CAPSULE | Freq: Two times a day (BID) | ORAL | 0 refills | Status: DC

## 2022-07-23 NOTE — Assessment & Plan Note (Signed)
She is tolerating antibiotics without issues but is having recurrent problems with wound drainage.  I'm not sure why she continues to have this problem, but the drainage seems to have decreased in recent days with resultant increased accumulation of fluid collection proximally to her distal incision.  Will obtain repeat CT left hip with and without contrast to get better idea of what is going on.  She will continue with current antibiotics and I sent refills today as she reports being out of antibiotics after today.  Continue cefadroxil 1gm BID and Doxycycline 141m BID for now.  Check ESR, CRP.  Follow up in 1 week via video visit.

## 2022-07-23 NOTE — Progress Notes (Signed)
[facility] for Infectious Disease  CHIEF COMPLAINT:    Follow up for suspected hardware infection  SUBJECTIVE:    Melanie Carrillo is a 87 y.o. female with PMHx as below who presents to the clinic for suspected hardware infection.   Patient underwent cephalomedullary nailing of left subtrochanteric fracture in May 2023.  This nail failed and subsequently required revision and new hardware placement on 04/28/22 with Dr Doreatha Martin.  She then developed increased incisional pain and a bullous lesion that spontaneously erupted with fluid that appeared purulent in nature.  She was treated with oral doxycycline without much improvement.  Thus, she was taken to the OR for debridement.  Tissue was not grossly infected by appearance at the time of surgery, however, there was communication via sinus to the hardware.  This could have been relatd to the bone cement that was used during initial surgery, however, it was difficult to definitively exclude infection component as well.  Discussed with orthopedic surgery whom did not feel that IV therapy was necessarily indicated and she was thus sent hom on doxycycline 110m BID and cefadroxil 1gm BID x 6 weeks through 08/09/22.  Her baseline ESR/CRP were 59 and 1.2 respectively.  She is tolerating antibiotics at this time without major issues.  Her operative cultures were finalized as no growth.   She reports today having drainage from her recent incision that has been serosanguinous in nature.  Her daughter reports a subcutaneous fluid collection proximal to this incision that when palpated resulted in further drainage from her incision.  About 4 days ago the drainage stopped and this area of fluid in her hip appears to have increased as a result.   Please see A&P for the details of today's visit and status of the patient's medical problems.   Patient's Medications  New Prescriptions   No medications on file  Previous Medications   ACETAMINOPHEN (TYLENOL)  325 MG TABLET    Take 1-2 tablets (325-650 mg total) by mouth every 6 (six) hours as needed for mild pain (pain score 1-3 or temp > 100.5).   AMLODIPINE (NORVASC) 10 MG TABLET    Take 1/2 tablet (5 mg total) by mouth daily.   ASPIRIN 325 MG TABLET    Take 1 tablet (325 mg total) by mouth daily.   BISACODYL (DULCOLAX) 5 MG EC TABLET    Take 5 mg by mouth in the morning.   BRIMONIDINE (ALPHAGAN) 0.2 % OPHTHALMIC SOLUTION    Place 1 drop into both eyes in the morning and at bedtime.   CHOLECALCIFEROL (VITAMIN D-3) 125 MCG (5000 UT) TABS    Take 5,000 Units by mouth in the morning.   FEEDING SUPPLEMENT (ENSURE ENLIVE / ENSURE PLUS) LIQD    Take 237 mLs by mouth 2 (two) times daily between meals.   GUAIFENESIN (MUCINEX) 600 MG 12 HR TABLET    Take 600 mg by mouth in the morning and at bedtime.   LATANOPROST (XALATAN) 0.005 % OPHTHALMIC SOLUTION    Place 1 drop into both eyes at bedtime.   LOSARTAN (COZAAR) 100 MG TABLET    Take by mouth.   LOVASTATIN (MEVACOR) 40 MG TABLET    Take 40 mg by mouth daily with supper.   MELATONIN 10 MG TABS    Take 40 mg by mouth at bedtime as needed (sleep).   MELOXICAM (MOBIC) 7.5 MG TABLET    Take 1 tablet (7.5 mg total) by mouth daily.  MONTELUKAST (SINGULAIR) 10 MG TABLET    Take 10 mg by mouth at bedtime.   MULTIPLE VITAMINS-MINERALS (MULTIVITAMIN WITH MINERALS) TABLET    Take 1 tablet by mouth in the morning.   OMEPRAZOLE (PRILOSEC) 40 MG CAPSULE    Take 40 mg by mouth daily before breakfast.   OXYCODONE (OXY IR/ROXICODONE) 5 MG IMMEDIATE RELEASE TABLET    Take 1 tablet (5 mg total) by mouth every 6 (six) hours as needed for severe pain (not controlled with Tylenol).   SERTRALINE (ZOLOFT) 50 MG TABLET    Take 1 tablet (50 mg total) by mouth daily.   TRAZODONE (DESYREL) 50 MG TABLET    Take 25-50 mg by mouth at bedtime as needed.  Modified Medications   Modified Medication Previous Medication   CEFADROXIL (DURICEF) 500 MG CAPSULE cefadroxil (DURICEF) 500 MG  capsule      Take 2 capsules (1,000 mg total) by mouth 2 (two) times daily for 21 days.    Take 2 capsules (1,000 mg total) by mouth 2 (two) times daily.   DOXYCYCLINE (VIBRA-TABS) 100 MG TABLET doxycycline (VIBRA-TABS) 100 MG tablet      Take 1 tablet (100 mg total) by mouth every 12 (twelve) hours for 21 days.    Take 1 tablet (100 mg total) by mouth every 12 (twelve) hours.  Discontinued Medications   No medications on file      Past Medical History:  Diagnosis Date   Allergy    Anxiety    Arthritis    Cataract    Colon cancer ([facility])    Constipation    Dyspnea    GERD (gastroesophageal reflux disease)    Glaucoma    HLD (hyperlipidemia)    Hypertension    Obstructive sleep apnea 01/29/2014   Osteoporosis    Substance abuse ([place])     Social History   Tobacco Use   Smoking status: Never   Smokeless tobacco: Never  Vaping Use   Vaping Use: Never used  Substance Use Topics   Alcohol use: Never   Drug use: Never    Family History  Problem Relation Age of Onset   Hypertension Mother    Heart disease Mother    Heart disease Father     No Known Allergies  Review of Systems  All other systems reviewed and are negative.  Except as noted above  OBJECTIVE:    Vitals:   07/23/22 0955  BP: 124/81  Pulse: 86  Temp: 97.8 F (36.6 C)  TempSrc: Oral  SpO2: 97%   There is no height or weight on file to calculate BMI.  Physical Exam Constitutional:      Appearance: Normal appearance.  HENT:     Head: Normocephalic and atraumatic.  Musculoskeletal:     Comments: Left hip proximal incision healed. Left hip more distal incision with sutures in place and scabbing.  No drainage. Firm, nodule proximal to more recent incision approximately size of tennis ball.  Mild flucutance.  No warmth, erythema, drainage.   Skin:    General: Skin is warm and dry.  Neurological:     General: No focal deficit present.     Mental Status: She is alert and oriented to person,  place, and time.  Psychiatric:        Mood and Affect: Mood normal.        Behavior: Behavior normal.      Labs and Microbiology:    Latest Ref Rng & Units 07/13/2022  3:12 PM 06/25/2022    3:32 AM 06/24/2022    3:52 AM  CBC  WBC 3.4 - 10.8 x10E3/uL 7.3  8.0  6.2   Hemoglobin 11.1 - 15.9 g/dL 11.5  9.9  9.2   Hematocrit 34.0 - 46.6 % 34.9  31.2  29.2   Platelets 150 - 450 x10E3/uL 316  255  225       Latest Ref Rng & Units 07/13/2022    3:12 PM 06/24/2022    3:52 AM 06/23/2022    6:17 AM  CMP  Glucose 70 - 99 mg/dL 100  112  108   BUN 8 - 27 mg/dL _0 Creatinine 0.57 - 1.00 mg/dL 0.62  0.63  0.62   Sodium 134 - 144 mmol/L 134  132  133   Potassium 3.5 - 5.2 mmol/L 5.0  4.2  4.2   Chloride 96 - 106 mmol/L 99  105  102   CO2 20 - 29 mmol/L _1 Calcium 8.7 - 10.3 mg/dL 10.5  8.9  10.0   Total Protein 6.0 - 8.5 g/dL 7.0     Total Bilirubin 0.0 - 1.2 mg/dL 0.2     Alkaline Phos 44 - 121 IU/L 118     AST 0 - 40 IU/L 20     ALT 0 - 32 IU/L 11          ASSESSMENT & PLAN:    Wound infection She is tolerating antibiotics without issues but is having recurrent problems with wound drainage.  I'm not sure why she continues to have this problem, but the drainage seems to have decreased in recent days with resultant increased accumulation of fluid collection proximally to her distal incision.  Will obtain repeat CT left hip with and without contrast to get better idea of what is going on.  She will continue with current antibiotics and I sent refills today as she reports being out of antibiotics after today.  Continue cefadroxil 1gm BID and Doxycycline 144m BID for now.  Check ESR, CRP.  Follow up in 1 week via video visit.    Orders Placed This Encounter  Procedures   CT HIP LEFT W WO CONTRAST    Standing Status:   Future    Standing Expiration Date:   07/24/2023    Order Specific Question:   If indicated for the ordered procedure, I authorize the  administration of contrast media per Radiology protocol    Answer:   Yes    Order Specific Question:   Does the patient have a contrast media/X-ray dye allergy?    Answer:   No    Order Specific Question:   [facility]    [facility]  Sedimentation rate   C-reactive protein       [facility] 07/23/2022, 10:41 AM

## 2022-07-24 LAB — C-REACTIVE PROTEIN: CRP: 26.9 mg/L — ABNORMAL HIGH (ref <8.0)

## 2022-07-24 LAB — SEDIMENTATION RATE: Sed Rate: 82 mm/h — ABNORMAL HIGH (ref 0–30)

## 2022-07-26 ENCOUNTER — Ambulatory Visit
Admission: RE | Admit: 2022-07-26 | Discharge: 2022-07-26 | Disposition: A | Discharge status: Home / Self Care | Payer: Medicare HMO | Source: Ambulatory Visit | Attending: Internal Medicine | Admitting: Internal Medicine

## 2022-07-26 DIAGNOSIS — T148XXA Other injury of unspecified body region, initial encounter: Secondary | ICD-10-CM | POA: Diagnosis not present

## 2022-07-26 DIAGNOSIS — Z96642 Presence of left artificial hip joint: Secondary | ICD-10-CM | POA: Diagnosis not present

## 2022-07-26 DIAGNOSIS — L089 Local infection of the skin and subcutaneous tissue, unspecified: Secondary | ICD-10-CM | POA: Insufficient documentation

## 2022-07-26 DIAGNOSIS — S72112A Displaced fracture of greater trochanter of left femur, initial encounter for closed fracture: Secondary | ICD-10-CM | POA: Diagnosis not present

## 2022-07-26 MED ORDER — IOHEXOL 300 MG/ML  SOLN
100.0000 mL | Freq: Once | INTRAMUSCULAR | Status: AC | PRN
  Administered 2022-07-26: 100 mL via INTRAVENOUS

## 2022-07-27 ENCOUNTER — Other Ambulatory Visit: Payer: Self-pay

## 2022-07-27 ENCOUNTER — Encounter (HOSPITAL_COMMUNITY): Payer: Self-pay | Admitting: Student

## 2022-07-27 ENCOUNTER — Ambulatory Visit: Payer: Self-pay | Admitting: Student

## 2022-07-27 DIAGNOSIS — T84199D Other mechanical complication of internal fixation device of unspecified bone of limb, subsequent encounter: Secondary | ICD-10-CM | POA: Diagnosis not present

## 2022-07-27 DIAGNOSIS — S72142D Displaced intertrochanteric fracture of left femur, subsequent encounter for closed fracture with routine healing: Secondary | ICD-10-CM | POA: Diagnosis not present

## 2022-07-27 NOTE — H&P (Signed)
[facility] (OTS) H&P  Patient ID: Melanie Carrillo MRN: [id number] DOB/AGE: 1934-10-29 87 y.o.  Reason for surgery: Postop wound drainage left hip  HPI: Melanie Carrillo is an 87 y.o. female  with PMH significant for hypertension, GERD, interstitial lung disease presenting for surgery on left lower extremity.  Patient initially underwent cephalomedullary nailing of left subtrochanteric femur fracture by Dr. Sharlet Salina at [facility] in May 2023.  Patient unfortunately had a catastrophic failure of her implant postoperatively.  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 Doreatha Martin.  Following repair of nonunion patient developed persistent wound drainage and underwent I&D of the left hip on 06/23/2022.  Cultures were obtained intraoperatively we did not have any visible growth.  She was started on oral antibiotics per infectious disease and has been on these antibiotics since that time.  Patient continues to have intermittent drainage from the left hip.  CT scan left hip performed on 07/26/2022 did not show any drainable fluid collection but there is concern there is a connection from the fracture to the skin surface where the drainage is presenting itself.  She presents now for repeat irrigation debridement of the left hip.  Currently denies any fevers or chills.  Ambulating with a walker  Past Medical History:  Diagnosis Date   Allergy    Anxiety    Arthritis    Cataract    Colon cancer ([place])    Constipation    Dyspnea    GERD (gastroesophageal reflux disease)    Glaucoma    HLD (hyperlipidemia)    Hypertension    Obstructive sleep apnea 01/29/2014   Osteoporosis    Substance abuse ([place])     Past Surgical History:  Procedure Laterality Date   APPENDECTOMY     CARDIAC CATHETERIZATION     COLON SURGERY     COLONOSCOPY     EYE SURGERY     cataracts   FEMUR IM NAIL Left 04/28/2022   Procedure: REVISION FIXATION OF LEFT FEMUR FRACTURE;   Surgeon: Shona Needles, MD;  Location: [place];  Service: Orthopedics;  Laterality: Left;   FRACTURE SURGERY     HARDWARE REMOVAL Left 04/28/2022   Procedure: HARDWARE REMOVAL;  Surgeon: Shona Needles, MD;  Location: [place];  Service: Orthopedics;  Laterality: Left;   HERNIA REPAIR     I & D EXTREMITY Left 06/23/2022   Procedure: IRRIGATION AND DEBRIDEMENT LEFT HIP;  Surgeon: Shona Needles, MD;  Location: [place];  Service: Orthopedics;  Laterality: Left;   INTRAMEDULLARY (IM) NAIL INTERTROCHANTERIC Left 12/26/2021   Procedure: INTRAMEDULLARY (IM) NAIL INTERTROCHANTRIC;  Surgeon: Renee Harder, MD;  Location: [facility];  Service: Orthopedics;  Laterality: Left;   MULTIPLE TOOTH EXTRACTIONS     full dentures   shoulder  surgery Left    TONSILLECTOMY     TUBAL LIGATION     UPPER GI ENDOSCOPY     WRIST SURGERY Right     Family History  Problem Relation Age of Onset   Hypertension Mother    Heart disease Mother    Heart disease Father     Social History:  reports that she has never smoked. She has never used smokeless tobacco. She reports that she does not drink alcohol and does not use drugs.  Allergies: No Known Allergies  Medications: I have reviewed the patient's current medications. Prior to Admission:  No medications prior to admission.    ROS: Constitutional: No fever or  chills Vision: No changes in vision ENT: No difficulty swallowing CV: No chest pain Pulm: No SOB or wheezing GI: No nausea or vomiting GU: No urgency or inability to hold urine Skin: No poor wound healing Neurologic: No numbness or tingling Psychiatric: No depression or anxiety Heme: No bruising Allergic: No reaction to medications or food   Exam: There were no vitals taken for this visit. General: No acute distress Orientation: Alert and oriented x 4 Mood and Affect: Mood and affect appropriate, pleasant and cooperative Gait: Slightly antalgic with use of rolling walker Coordination and  balance: Within normal limits  Left lower extremity: Healed surgical incision.  No active drainage.  There is a hard nodule proximal to the incision.  This area is mildly uncomfortable with palpation.  No redness.  Motor and sensory function intact.  Neurovascularly intact  Right lower extremity: Skin without lesions. No tenderness to palpation. Full painless ROM, full strength in each muscle group without evidence of instability.   Medical Decision Making: Data: Imaging: CT scan left hip shows not fully healed inotrope/subtrochanteric fracture.  There is some lucency noted along the bone graft material used within the femoral neck.  There is a possible sinus tract from the fracture to the skin surface.    Labs:  Results for orders placed or performed in visit on 07/23/22 (from the past 168 hour(s))  Sedimentation rate   Collection Time: 07/23/22 10:42 AM  Result Value Ref Range   Sed Rate 82 (H) 0 - 30 mm/h  C-reactive protein   Collection Time: 07/23/22 10:42 AM  Result Value Ref Range   CRP 26.9 (H) <8.0 mg/L     Assessment/Plan: 87 year old female status post repair of left subtrochanteric femur fracture nonunion on 04/28/2022 presenting now with persistent wound drainage  Patient continues to have intermittent drainage from the left hip despite being on antibiotics.  I feel that drainage is more likely related to the bone graft material used during surgery in September most likely coming from infection itself.  Her inflammatory markers are elevated.  At this point, I recommend proceeding with irrigation debridement of the left hip.  Risk and benefits of procedure were discussed with the patient and her daughter.  They agreed to proceed with surgery.  Will plan admit patient overnight for observation.   Gwinda Passe PA-C [facility] [phone] (office) [email or web address]

## 2022-07-27 NOTE — Progress Notes (Signed)
S.D.W- Instructions   Your procedure is scheduled on Wed., Dec. 6, 2023 from 10:29AM-11:39AM.  Report to [facility] Main Entrance "A" at 8:00 A.M., then check in with the Admitting office.  Call this number if you have problems the morning of surgery:  [phone]             If you experience any cold or flu symptoms such as cough, fever, chills, shortness of breath, etc. between now and your scheduled surgery, please notify us at the above         number.  Remember:  Do not eat or drink after midnight on Dec. 5th    Take these medicines the morning of surgery with A SIP OF WATER: Brimonidine (ALPHAGAN)  GuaiFENesin (MUCINEX)  Omeprazole (PRILOSEC)  Sertraline (ZOLOFT)   If Needed: Acetaminophen (TYLENOL)  OxyCODONE (OXY IR/ROXICODONE)   As of today, STOP taking any Aspirin (unless otherwise instructed by your surgeon) Aleve, Naproxen, Ibuprofen, Meloxicam (MOBIC), Motrin, Advil, Goody's, BC's, all herbal medications, fish oil, and all vitamins.          Do not wear jewelry or makeup. Do not wear lotions, powders, perfumes or deodorant. Do not shave 48 hours prior to surgery.  Do not bring valuables to the hospital. Do not wear nail polish, gel polish, artificial nails, or any other type of covering on natural nails (fingers and toes) If you have artificial nails or gel coating that need to be removed by a nail salon, please have this removed prior to surgery. Artificial nails or gel coating may interfere with anesthesia's ability to adequately monitor your vital signs.  [facility] is not responsible for any belongings or valuables.    Do NOT Smoke (Tobacco/Vaping)  24 hours prior to your procedure  If you use a CPAP at night, you may bring your mask for your overnight stay.   Contacts, glasses, hearing aids, dentures or partials may not be worn into surgery, please bring cases for these belongings   For patients admitted to the hospital, discharge time will be determined by  your treatment team.   Patients discharged the day of surgery will not be allowed to drive home, and someone needs to stay with them for 24 hours.  Special instructions:    Oral Hygiene is also important to reduce your risk of infection.  Remember - BRUSH YOUR TEETH THE MORNING OF SURGERY WITH YOUR REGULAR TOOTHPASTE  [facility]- Preparing For Surgery  Before surgery, you can play an important role. Because skin is not sterile, your skin needs to be as free of germs as possible. You can reduce the number of germs on your skin by washing with Antibacterial Soap before surgery.     Please follow these instructions carefully.     Shower the NIGHT BEFORE SURGERY and the MORNING OF SURGERY with Antibacterial Soap.   Pat yourself dry with a CLEAN TOWEL.  Wear CLEAN PAJAMAS to bed the night before surgery  Place CLEAN SHEETS on your bed the night before your surgery  DO NOT SLEEP WITH PETS.  Day of Surgery:  Take a shower with Antibacterial soap. Wear Clean/Comfortable clothing the morning of surgery Do not apply any deodorants/lotions.   Remember to brush your teeth WITH YOUR REGULAR TOOTHPASTE.   If you test positive for Covid, or been in contact with anyone that has tested positive in the last 10 days, please notify your surgeon.  SURGICAL WAITING ROOM VISITATION Patients having surgery or a procedure may have  no more than 2 support people in the waiting area - these visitors may rotate.   Children under the age of 14 must have an adult with them who is not the patient. If the patient needs to stay at the hospital during part of their recovery, the visitor guidelines for inpatient rooms apply. Pre-op nurse will coordinate an appropriate time for 1 support person to accompany patient in pre-op.  This support person may not rotate.   Please refer to the [facility] website for the visitor guidelines for Inpatients (after your surgery is over and you are in a regular room).

## 2022-07-27 NOTE — Progress Notes (Signed)
PCP - Dr. Alba Cory  Cardiologist - Denies  EP- Denies  Endocrine- Denies  Pulm- Denies  Chest x-ray - Denies  EKG - 12/25/21 (E)  Stress Test - 09/14/21 (CE)  ECHO - 09/24/21 (CE)  Cardiac Cath - Yes- negative  AICD-na PM-na LOOP-na  Nerve Stimulator- Denies  Dialysis- Denies  Sleep Study - Yes- Positive CPAP - Denies  LABS- 07/13/22(E): CBCw/D, CMP  ASA- Denies  ERAS- No  HA1C- Denies  Anesthesia- No  Pt denies having chest pain, sob, or fever during the pre-op phone call. All instructions explained to the pt, with a verbal understanding of the material. Pt also instructed to wear a mask and social distance if she goes out. The opportunity to ask questions was provided.

## 2022-07-28 ENCOUNTER — Observation Stay (HOSPITAL_COMMUNITY)
Admission: RE | Admit: 2022-07-28 | Discharge: 2022-07-29 | Disposition: A | Discharge status: Home / Self Care | Payer: Medicare HMO | Source: Ambulatory Visit | Attending: Student | Admitting: Student

## 2022-07-28 ENCOUNTER — Ambulatory Visit (HOSPITAL_COMMUNITY): Payer: Medicare HMO | Admitting: Anesthesiology

## 2022-07-28 ENCOUNTER — Encounter (HOSPITAL_COMMUNITY): Payer: Self-pay | Admitting: Student

## 2022-07-28 ENCOUNTER — Ambulatory Visit (HOSPITAL_COMMUNITY): Payer: Medicare HMO

## 2022-07-28 ENCOUNTER — Other Ambulatory Visit: Payer: Self-pay

## 2022-07-28 ENCOUNTER — Encounter (HOSPITAL_COMMUNITY)
Admission: RE | Disposition: A | Discharge status: Home / Self Care | Payer: Self-pay | Source: Ambulatory Visit | Attending: Student

## 2022-07-28 ENCOUNTER — Ambulatory Visit (HOSPITAL_BASED_OUTPATIENT_CLINIC_OR_DEPARTMENT_OTHER): Payer: Medicare HMO | Admitting: Anesthesiology

## 2022-07-28 DIAGNOSIS — I10 Essential (primary) hypertension: Secondary | ICD-10-CM

## 2022-07-28 DIAGNOSIS — F418 Other specified anxiety disorders: Secondary | ICD-10-CM

## 2022-07-28 DIAGNOSIS — Y792 Prosthetic and other implants, materials and accessory orthopedic devices associated with adverse incidents: Secondary | ICD-10-CM | POA: Diagnosis not present

## 2022-07-28 DIAGNOSIS — T8149XA Infection following a procedure, other surgical site, initial encounter: Secondary | ICD-10-CM

## 2022-07-28 DIAGNOSIS — S72142A Displaced intertrochanteric fracture of left femur, initial encounter for closed fracture: Secondary | ICD-10-CM | POA: Diagnosis not present

## 2022-07-28 DIAGNOSIS — T84621A Infection and inflammatory reaction due to internal fixation device of left femur, initial encounter: Secondary | ICD-10-CM | POA: Diagnosis not present

## 2022-07-28 DIAGNOSIS — M869 Osteomyelitis, unspecified: Secondary | ICD-10-CM | POA: Diagnosis not present

## 2022-07-28 DIAGNOSIS — T8452XA Infection and inflammatory reaction due to internal left hip prosthesis, initial encounter: Principal | ICD-10-CM | POA: Insufficient documentation

## 2022-07-28 DIAGNOSIS — R2689 Other abnormalities of gait and mobility: Secondary | ICD-10-CM | POA: Diagnosis not present

## 2022-07-28 DIAGNOSIS — R69 Illness, unspecified: Secondary | ICD-10-CM | POA: Diagnosis not present

## 2022-07-28 DIAGNOSIS — Z85038 Personal history of other malignant neoplasm of large intestine: Secondary | ICD-10-CM | POA: Diagnosis not present

## 2022-07-28 DIAGNOSIS — M1990 Unspecified osteoarthritis, unspecified site: Secondary | ICD-10-CM

## 2022-07-28 HISTORY — PX: I & D EXTREMITY: SHX5045

## 2022-07-28 SURGERY — IRRIGATION AND DEBRIDEMENT EXTREMITY
Anesthesia: General | Site: Leg Upper | Laterality: Left

## 2022-07-28 MED ORDER — VANCOMYCIN HCL 1000 MG IV SOLR
INTRAVENOUS | Status: DC | PRN
  Administered 2022-07-28: 1000 mg via TOPICAL

## 2022-07-28 MED ORDER — SERTRALINE HCL 50 MG PO TABS
50.0000 mg | ORAL_TABLET | Freq: Every day | ORAL | Status: DC
  Administered 2022-07-29: 50 mg via ORAL
  Filled 2022-07-28: qty 1

## 2022-07-28 MED ORDER — POLYETHYLENE GLYCOL 3350 17 G PO PACK: 17.0000 g | PACK | Freq: Every day | ORAL | Status: DC | PRN

## 2022-07-28 MED ORDER — METOCLOPRAMIDE HCL 5 MG/ML IJ SOLN: 5.0000 mg | Freq: Three times a day (TID) | INTRAMUSCULAR | Status: DC | PRN

## 2022-07-28 MED ORDER — BRIMONIDINE TARTRATE 0.2 % OP SOLN
1.0000 [drp] | Freq: Two times a day (BID) | OPHTHALMIC | Status: DC
  Administered 2022-07-29: 1 [drp] via OPHTHALMIC
  Filled 2022-07-28 (×2): qty 5

## 2022-07-28 MED ORDER — PANTOPRAZOLE SODIUM 40 MG PO TBEC
80.0000 mg | DELAYED_RELEASE_TABLET | Freq: Every day | ORAL | Status: DC
  Administered 2022-07-28: 80 mg via ORAL
  Filled 2022-07-28: qty 2

## 2022-07-28 MED ORDER — DEXAMETHASONE SODIUM PHOSPHATE 10 MG/ML IJ SOLN
INTRAMUSCULAR | Status: AC
  Filled 2022-07-28: qty 1

## 2022-07-28 MED ORDER — ACETAMINOPHEN 500 MG PO TABS
1000.0000 mg | ORAL_TABLET | Freq: Once | ORAL | Status: DC
  Filled 2022-07-28: qty 2

## 2022-07-28 MED ORDER — BISACODYL 5 MG PO TBEC
5.0000 mg | DELAYED_RELEASE_TABLET | Freq: Every morning | ORAL | Status: DC
  Administered 2022-07-29: 5 mg via ORAL
  Filled 2022-07-28: qty 1

## 2022-07-28 MED ORDER — FENTANYL CITRATE (PF) 250 MCG/5ML IJ SOLN
INTRAMUSCULAR | Status: AC
  Filled 2022-07-28: qty 5

## 2022-07-28 MED ORDER — MORPHINE SULFATE (PF) 2 MG/ML IV SOLN: 0.5000 mg | INTRAVENOUS | Status: DC | PRN

## 2022-07-28 MED ORDER — ACETAMINOPHEN 325 MG PO TABS
650.0000 mg | ORAL_TABLET | Freq: Four times a day (QID) | ORAL | Status: DC | PRN
  Administered 2022-07-29: 975 mg via ORAL
  Filled 2022-07-28: qty 3

## 2022-07-28 MED ORDER — VANCOMYCIN HCL 1000 MG IV SOLR
INTRAVENOUS | Status: AC
  Filled 2022-07-28: qty 20

## 2022-07-28 MED ORDER — FENTANYL CITRATE (PF) 100 MCG/2ML IJ SOLN
INTRAMUSCULAR | Status: DC | PRN
  Administered 2022-07-28: 50 ug via INTRAVENOUS
  Administered 2022-07-28 (×2): 25 ug via INTRAVENOUS
  Administered 2022-07-28: 50 ug via INTRAVENOUS

## 2022-07-28 MED ORDER — ONDANSETRON HCL 4 MG/2ML IJ SOLN
INTRAMUSCULAR | Status: DC | PRN
  Administered 2022-07-28: 4 mg via INTRAVENOUS

## 2022-07-28 MED ORDER — ONDANSETRON HCL 4 MG/2ML IJ SOLN: 4.0000 mg | Freq: Four times a day (QID) | INTRAMUSCULAR | Status: DC | PRN

## 2022-07-28 MED ORDER — CEFADROXIL 500 MG PO CAPS
1000.0000 mg | ORAL_CAPSULE | Freq: Two times a day (BID) | ORAL | Status: DC
  Administered 2022-07-28 – 2022-07-29 (×2): 1000 mg via ORAL
  Filled 2022-07-28 (×2): qty 2

## 2022-07-28 MED ORDER — METOCLOPRAMIDE HCL 5 MG PO TABS: 5.0000 mg | ORAL_TABLET | Freq: Three times a day (TID) | ORAL | Status: DC | PRN

## 2022-07-28 MED ORDER — LATANOPROST 0.005 % OP SOLN
1.0000 [drp] | Freq: Every day | OPHTHALMIC | Status: DC
  Administered 2022-07-28: 1 [drp] via OPHTHALMIC
  Filled 2022-07-28: qty 2.5

## 2022-07-28 MED ORDER — ONDANSETRON HCL 4 MG PO TABS: 4.0000 mg | ORAL_TABLET | Freq: Four times a day (QID) | ORAL | Status: DC | PRN

## 2022-07-28 MED ORDER — FENTANYL CITRATE (PF) 100 MCG/2ML IJ SOLN
25.0000 ug | INTRAMUSCULAR | Status: DC | PRN
  Administered 2022-07-28 (×2): 25 ug via INTRAVENOUS

## 2022-07-28 MED ORDER — DOXYCYCLINE HYCLATE 100 MG PO TABS
100.0000 mg | ORAL_TABLET | Freq: Two times a day (BID) | ORAL | Status: DC
  Administered 2022-07-28 – 2022-07-29 (×2): 100 mg via ORAL
  Filled 2022-07-28 (×2): qty 1

## 2022-07-28 MED ORDER — LIDOCAINE 2% (20 MG/ML) 5 ML SYRINGE
INTRAMUSCULAR | Status: DC | PRN
  Administered 2022-07-28: 60 mg via INTRAVENOUS

## 2022-07-28 MED ORDER — PROPOFOL 10 MG/ML IV BOLUS
INTRAVENOUS | Status: DC | PRN
  Administered 2022-07-28: 120 mg via INTRAVENOUS
  Administered 2022-07-28: 50 mg via INTRAVENOUS

## 2022-07-28 MED ORDER — ENSURE ENLIVE PO LIQD: 237.0000 mL | Freq: Every day | ORAL | Status: DC | PRN

## 2022-07-28 MED ORDER — CHLORHEXIDINE GLUCONATE 0.12 % MT SOLN
15.0000 mL | OROMUCOSAL | Status: AC
  Administered 2022-07-28: 15 mL via OROMUCOSAL
  Filled 2022-07-28 (×2): qty 15

## 2022-07-28 MED ORDER — LIDOCAINE 2% (20 MG/ML) 5 ML SYRINGE
INTRAMUSCULAR | Status: AC
  Filled 2022-07-28: qty 5

## 2022-07-28 MED ORDER — PROPOFOL 10 MG/ML IV BOLUS
INTRAVENOUS | Status: AC
  Filled 2022-07-28: qty 20

## 2022-07-28 MED ORDER — OXYCODONE HCL 5 MG PO TABS
5.0000 mg | ORAL_TABLET | Freq: Four times a day (QID) | ORAL | Status: DC | PRN
  Administered 2022-07-28: 5 mg via ORAL
  Filled 2022-07-28: qty 1

## 2022-07-28 MED ORDER — VITAMIN D 25 MCG (1000 UNIT) PO TABS
5000.0000 [IU] | ORAL_TABLET | Freq: Every day | ORAL | Status: DC
  Administered 2022-07-29: 5000 [IU] via ORAL
  Filled 2022-07-28: qty 5

## 2022-07-28 MED ORDER — TOBRAMYCIN SULFATE 1.2 G IJ SOLR
INTRAMUSCULAR | Status: AC
  Filled 2022-07-28: qty 1.2

## 2022-07-28 MED ORDER — TRAZODONE HCL 50 MG PO TABS
25.0000 mg | ORAL_TABLET | Freq: Every evening | ORAL | Status: DC | PRN
  Administered 2022-07-28: 50 mg via ORAL
  Filled 2022-07-28: qty 1

## 2022-07-28 MED ORDER — PROPOFOL 500 MG/50ML IV EMUL
INTRAVENOUS | Status: DC | PRN
  Administered 2022-07-28: 125 ug/kg/min via INTRAVENOUS

## 2022-07-28 MED ORDER — 0.9 % SODIUM CHLORIDE (POUR BTL) OPTIME
TOPICAL | Status: DC | PRN
  Administered 2022-07-28: 1000 mL

## 2022-07-28 MED ORDER — VITAMIN D3 25 MCG (1000 UNIT) PO TABS
5000.0000 [IU] | ORAL_TABLET | Freq: Every morning | ORAL | Status: DC
  Filled 2022-07-28: qty 5

## 2022-07-28 MED ORDER — ONDANSETRON HCL 4 MG/2ML IJ SOLN
INTRAMUSCULAR | Status: AC
  Filled 2022-07-28: qty 2

## 2022-07-28 MED ORDER — ONDANSETRON HCL 4 MG/2ML IJ SOLN: 4.0000 mg | Freq: Once | INTRAMUSCULAR | Status: DC | PRN

## 2022-07-28 MED ORDER — HYDRALAZINE HCL 10 MG PO TABS: 10.0000 mg | ORAL_TABLET | Freq: Four times a day (QID) | ORAL | Status: DC | PRN

## 2022-07-28 MED ORDER — LOSARTAN POTASSIUM 50 MG PO TABS
100.0000 mg | ORAL_TABLET | Freq: Every day | ORAL | Status: DC
  Administered 2022-07-29: 100 mg via ORAL
  Filled 2022-07-28: qty 2

## 2022-07-28 MED ORDER — PRAVASTATIN SODIUM 40 MG PO TABS
40.0000 mg | ORAL_TABLET | Freq: Every day | ORAL | Status: DC
  Administered 2022-07-28: 40 mg via ORAL
  Filled 2022-07-28: qty 1

## 2022-07-28 MED ORDER — LACTATED RINGERS IV SOLN: INTRAVENOUS | Status: DC

## 2022-07-28 MED ORDER — DEXAMETHASONE SODIUM PHOSPHATE 4 MG/ML IJ SOLN
INTRAMUSCULAR | Status: DC | PRN
  Administered 2022-07-28: 5 mg via INTRAVENOUS

## 2022-07-28 MED ORDER — TOBRAMYCIN SULFATE 1.2 G IJ SOLR
INTRAMUSCULAR | Status: DC | PRN
  Administered 2022-07-28: 1.2 g via TOPICAL

## 2022-07-28 MED ORDER — MONTELUKAST SODIUM 10 MG PO TABS: 10.0000 mg | ORAL_TABLET | Freq: Every day | ORAL | Status: DC

## 2022-07-28 MED ORDER — PHENYLEPHRINE HCL-NACL 20-0.9 MG/250ML-% IV SOLN
INTRAVENOUS | Status: DC | PRN
  Administered 2022-07-28: 50 ug/min via INTRAVENOUS

## 2022-07-28 MED ORDER — FENTANYL CITRATE (PF) 100 MCG/2ML IJ SOLN
INTRAMUSCULAR | Status: AC
  Filled 2022-07-28: qty 2

## 2022-07-28 MED ORDER — DOCUSATE SODIUM 100 MG PO CAPS
100.0000 mg | ORAL_CAPSULE | Freq: Two times a day (BID) | ORAL | Status: DC
  Administered 2022-07-28 – 2022-07-29 (×2): 100 mg via ORAL
  Filled 2022-07-28 (×2): qty 1

## 2022-07-28 MED ORDER — MELATONIN 5 MG PO TABS: 40.0000 mg | ORAL_TABLET | Freq: Every evening | ORAL | Status: DC | PRN

## 2022-07-28 MED ORDER — GUAIFENESIN ER 600 MG PO TB12
600.0000 mg | ORAL_TABLET | Freq: Two times a day (BID) | ORAL | Status: DC
  Administered 2022-07-28 – 2022-07-29 (×2): 600 mg via ORAL
  Filled 2022-07-28 (×2): qty 1

## 2022-07-28 MED ORDER — CEFAZOLIN SODIUM-DEXTROSE 2-4 GM/100ML-% IV SOLN
2.0000 g | INTRAVENOUS | Status: AC
  Administered 2022-07-28: 2 g via INTRAVENOUS
  Filled 2022-07-28: qty 100

## 2022-07-28 SURGICAL SUPPLY — 53 items
BAG COUNTER SPONGE SURGICOUNT (BAG) ×1 IMPLANT
BNDG COHESIVE 4X5 TAN STRL (GAUZE/BANDAGES/DRESSINGS) ×1 IMPLANT
BNDG GAUZE DERMACEA FLUFF 4 (GAUZE/BANDAGES/DRESSINGS) ×2 IMPLANT
BRUSH SCRUB EZ  4% CHG (MISCELLANEOUS) ×1
BRUSH SCRUB EZ 4% CHG (MISCELLANEOUS) IMPLANT
CANISTER WOUND CARE 500ML ATS (WOUND CARE) IMPLANT
CHLORAPREP W/TINT 26 (MISCELLANEOUS) ×1 IMPLANT
CNTNR URN SCR LID CUP LEK RST (MISCELLANEOUS) IMPLANT
CONT SPEC 4OZ STRL OR WHT (MISCELLANEOUS) ×2
COVER MAYO STAND STRL (DRAPES) ×1 IMPLANT
COVER SURGICAL LIGHT HANDLE (MISCELLANEOUS) ×2 IMPLANT
DRAPE ORTHO SPLIT 77X108 STRL (DRAPES) ×1
DRAPE SURG 17X23 STRL (DRAPES) ×1 IMPLANT
DRAPE SURG ORHT 6 SPLT 77X108 (DRAPES) ×1 IMPLANT
DRAPE U-SHAPE 47X51 STRL (DRAPES) ×1 IMPLANT
DRESSING PEEL AND PLAC PRVNA20 (GAUZE/BANDAGES/DRESSINGS) IMPLANT
DRSG ADAPTIC 3X8 NADH LF (GAUZE/BANDAGES/DRESSINGS) ×1 IMPLANT
DRSG PEEL AND PLACE PREVENA 20 (GAUZE/BANDAGES/DRESSINGS) ×1
DRSG TEGADERM 2-3/8X2-3/4 SM (GAUZE/BANDAGES/DRESSINGS) IMPLANT
ELECT REM PT RETURN 9FT ADLT (ELECTROSURGICAL)
ELECTRODE REM PT RTRN 9FT ADLT (ELECTROSURGICAL) IMPLANT
EVACUATOR 1/8 PVC DRAIN (DRAIN) IMPLANT
GAUZE SPONGE 2X2 8PLY STRL LF (GAUZE/BANDAGES/DRESSINGS) IMPLANT
GAUZE SPONGE 4X4 12PLY STRL (GAUZE/BANDAGES/DRESSINGS) ×1 IMPLANT
GLOVE BIO SURGEON STRL SZ 6.5 (GLOVE) ×3 IMPLANT
GLOVE BIO SURGEON STRL SZ7.5 (GLOVE) ×4 IMPLANT
GLOVE BIOGEL PI IND STRL 6.5 (GLOVE) ×1 IMPLANT
GLOVE BIOGEL PI IND STRL 7.5 (GLOVE) ×1 IMPLANT
GOWN STRL REUS W/ TWL LRG LVL3 (GOWN DISPOSABLE) ×2 IMPLANT
GOWN STRL REUS W/TWL LRG LVL3 (GOWN DISPOSABLE) ×4
HANDPIECE INTERPULSE COAX TIP (DISPOSABLE) ×1
IV NS IRRIG 3000ML ARTHROMATIC (IV SOLUTION) IMPLANT
KIT BASIN OR (CUSTOM PROCEDURE TRAY) ×1 IMPLANT
KIT DRSG PREVENA PLUS 7DAY 125 (MISCELLANEOUS) IMPLANT
KIT TURNOVER KIT B (KITS) ×1 IMPLANT
MANIFOLD NEPTUNE II (INSTRUMENTS) ×1 IMPLANT
NS IRRIG 1000ML POUR BTL (IV SOLUTION) ×1 IMPLANT
PACK ORTHO EXTREMITY (CUSTOM PROCEDURE TRAY) ×1 IMPLANT
PAD ARMBOARD 7.5X6 YLW CONV (MISCELLANEOUS) ×2 IMPLANT
PADDING CAST COTTON 6X4 STRL (CAST SUPPLIES) ×1 IMPLANT
SET HNDPC FAN SPRY TIP SCT (DISPOSABLE) IMPLANT
SPONGE T-LAP 18X18 ~~LOC~~+RFID (SPONGE) ×1 IMPLANT
SUT ETHILON 2 0 FS 18 (SUTURE) ×2 IMPLANT
SUT ETHILON 3 0 PS 1 (SUTURE) ×2 IMPLANT
SUT MON AB 2-0 CT1 36 (SUTURE) ×1 IMPLANT
SUT PDS AB 0 CT 36 (SUTURE) IMPLANT
SWAB CULTURE ESWAB REG 1ML (MISCELLANEOUS) IMPLANT
TOWEL GREEN STERILE (TOWEL DISPOSABLE) ×2 IMPLANT
TOWEL GREEN STERILE FF (TOWEL DISPOSABLE) ×1 IMPLANT
TUBE CONNECTING 12X1/4 (SUCTIONS) ×1 IMPLANT
UNDERPAD 30X36 HEAVY ABSORB (UNDERPADS AND DIAPERS) ×1 IMPLANT
WATER STERILE IRR 1000ML POUR (IV SOLUTION) ×1 IMPLANT
YANKAUER SUCT BULB TIP NO VENT (SUCTIONS) ×1 IMPLANT

## 2022-07-28 NOTE — Evaluation (Signed)
Physical Therapy Evaluation Patient Details Name: Melanie Carrillo MRN: [id number] DOB: Nov 09, 1934 Today's Date: 07/28/2022  History of Present Illness  87 y.o. female presents to [facility] hospital on 07/27/2022 with recurrent L hip infection. Pt with complicated history since hip fx in May 2023, involving multiple repairs and now recurrent infection. Pt underwent I&D on L hip on 07/28/2022. PMH significant for hypertension, GERD, interstitial lung disease.  Clinical Impression  Pt presents to PT with deficits in strength, power, endurance, balance. Pt is able to ambulate for short household distances with RW. PT notes reduced stance time on LLE likely due to pain. Pt will benefit from continued frequent mobilization in an effort to reduce falls risk and restore independence. PT anticipates no post-acute PT needs at this time.       Recommendations for follow up therapy are one component of a multi-disciplinary discharge planning process, led by the attending physician.  Recommendations may be updated based on patient status, additional functional criteria and insurance authorization.  Follow Up Recommendations No PT follow up      Assistance Recommended at Discharge PRN  Patient can return home with the following  A little help with bathing/dressing/bathroom;Assistance with cooking/housework;Assist for transportation;Help with stairs or ramp for entrance    Equipment Recommendations None recommended by PT  Recommendations for Other Services       Functional Status Assessment Patient has had a recent decline in their functional status and demonstrates the ability to make significant improvements in function in a reasonable and predictable amount of time.     Precautions / Restrictions Precautions Precautions: Fall Precaution Comments: L hip wound vac, hemovac drain Restrictions Weight Bearing Restrictions: Yes Other Position/Activity Restrictions: WBAT LLE      Mobility  Bed  Mobility Overal bed mobility: Needs Assistance Bed Mobility: Supine to Sit, Sit to Supine     Supine to sit: Supervision Sit to supine: Supervision   General bed mobility comments: increased time    Transfers Overall transfer level: Needs assistance Equipment used: Rolling walker (2 wheels) Transfers: Sit to/from Stand Sit to Stand: Min guard                Ambulation/Gait Ambulation/Gait assistance: Supervision Gait Distance (Feet): 40 Feet Assistive device: Rolling walker (2 wheels) Gait Pattern/deviations: Step-through pattern Gait velocity: reduced Gait velocity interpretation: <1.31 ft/sec, indicative of household ambulator   General Gait Details: slowed step-through gait, pt declines ambulation out of room  Stairs            Wheelchair Mobility    Modified Rankin (Stroke Patients Only)       Balance Overall balance assessment: Needs assistance Sitting-balance support: No upper extremity supported, Feet supported Sitting balance-Leahy Scale: Fair     Standing balance support: Single extremity supported, Reliant on assistive device for balance Standing balance-Leahy Scale: Poor                               Pertinent Vitals/Pain Pain Assessment Pain Assessment: Faces Faces Pain Scale: Hurts little more Pain Location: L hip Pain Descriptors / Indicators: Sore Pain Intervention(s): Monitored during session    Home Living Family/patient expects to be discharged to:: Private residence Living Arrangements: Other relatives Available Help at Discharge: Family Type of Home: House Home Access: Ramped entrance       Home Layout: One level Home Equipment: Conservation officer, nature (2 wheels);Rollator (4 wheels);Cane - single point;BSC/3in1;Wheelchair - manual;Hospital bed;Other (comment);Adaptive equipment  Additional Comments: Pt lives with family and able to provide 24/7 care. Pt's daughter is a Marine scientist. Pt has a lift chair.    Prior Function  Prior Level of Function : Needs assist             Mobility Comments: ambulatory with RW ADLs Comments: Daughter does apply lotion on lower half but pt can use extended sponge for reaching with bathing and reports she is capable of dressing herself; daughter provides transportation     Hand Dominance   Dominant Hand: Right    Extremity/Trunk Assessment   Upper Extremity Assessment Upper Extremity Assessment: Overall WFL for tasks assessed    Lower Extremity Assessment Lower Extremity Assessment: Generalized weakness;LLE deficits/detail LLE Deficits / Details: hip flexion against gravity 3-/5 currently, pt utilizing UEs to assist LLE with bed mobility    Cervical / Trunk Assessment Cervical / Trunk Assessment: Normal  Communication   Communication: HOH (hearing aides)  Cognition Arousal/Alertness: Awake/alert Behavior During Therapy: WFL for tasks assessed/performed Overall Cognitive Status: Within Functional Limits for tasks assessed                                          General Comments General comments (skin integrity, edema, etc.): VSS on RA    Exercises     Assessment/Plan    PT Assessment Patient needs continued PT services  PT Problem List Decreased strength;Decreased activity tolerance;Decreased balance;Decreased mobility       PT Treatment Interventions DME instruction;Gait training;Functional mobility training;Therapeutic activities;Balance training;Neuromuscular re-education;Patient/family education    PT Goals (Current goals can be found in the Care Plan section)  Acute Rehab PT Goals Patient Stated Goal: to go home PT Goal Formulation: With patient Time For Goal Achievement: 08/11/22 Potential to Achieve Goals: Good    Frequency Min 3X/week     Co-evaluation               AM-PAC PT "6 Clicks" Mobility  Outcome Measure Help needed turning from your back to your side while in a flat bed without using bedrails?: A  Little Help needed moving from lying on your back to sitting on the side of a flat bed without using bedrails?: A Little Help needed moving to and from a bed to a chair (including a wheelchair)?: A Little Help needed standing up from a chair using your arms (e.g., wheelchair or bedside chair)?: A Little Help needed to walk in hospital room?: A Little Help needed climbing 3-5 steps with a railing? : A Little 6 Click Score: 18    End of Session   Activity Tolerance: Patient tolerated treatment well Patient left: in bed;with call bell/phone within reach;with bed alarm set Nurse Communication: Mobility status PT Visit Diagnosis: Other abnormalities of gait and mobility (R26.89);Muscle weakness (generalized) (M62.81)    Time: 5993-5701 PT Time Calculation (min) (ACUTE ONLY): 14 min   Charges:   PT Evaluation $PT Eval Low Complexity: [place], PT, DPT Acute Rehabilitation Office [phone]   Zenaida Niece 07/28/2022, 5:00 PM

## 2022-07-28 NOTE — Progress Notes (Signed)
OT Cancellation Note  Patient Details Name: Melanie Carrillo MRN: [id number] DOB: 01-Jan-1935   Cancelled Treatment:    Reason Eval/Treat Not Completed: Other (comment).  Patient completing dinner, continue efforts as appropriate.    [name] 07/28/2022, 5:40 PM 07/28/2022  RP, OTR/L  Acute Rehabilitation Services  Office:  [phone]

## 2022-07-28 NOTE — Anesthesia Postprocedure Evaluation (Signed)
Anesthesia Post Note  Patient: Melanie Carrillo  Procedure(s) Performed: IRRIGATION AND DEBRIDEMENT HIP (Left: Leg Upper)     Patient location during evaluation: PACU Anesthesia Type: General Level of consciousness: awake and alert Pain management: pain level controlled Vital Signs Assessment: post-procedure vital signs reviewed and stable Respiratory status: spontaneous breathing, nonlabored ventilation, respiratory function stable and patient connected to nasal cannula oxygen Cardiovascular status: blood pressure returned to baseline and stable Postop Assessment: no apparent nausea or vomiting Anesthetic complications: no   No notable events documented.  Last Vitals:  Vitals:   07/28/22 1445 07/28/22 1500  BP: (!) 160/72 (!) 156/74  Pulse: 68 62  Resp: 17 14  Temp:  36.6 C  SpO2: 100% 99%    Last Pain:  Vitals:   07/28/22 1500  TempSrc:   PainSc: [place] Nazir Hacker

## 2022-07-28 NOTE — Op Note (Signed)
Orthopaedic Surgery Operative Note (CSN: [id number] ) Date of Surgery: 07/28/2022  Admit Date: 07/28/2022   Diagnoses: Pre-Op Diagnoses: Persistent left hip drainage  Post-Op Diagnosis: Same  Procedures: CPT 10180-Irrigation and debridement of left hip  Surgeons : Primary: Shona Needles, MD  Assistant: Patrecia Pace, PA-C  Location: OR 3   Anesthesia:General   Antibiotics: 1 gm vancomycin powder and 1.2 gm tobramycin powder placed topically   Tourniquet time: None    Estimated Blood Loss: 546 mL  Complications:None   Specimens: ID Type Source Tests Collected by Time Destination  A : left femur fracture site Tissue Soft Tissue, Other AEROBIC/ANAEROBIC CULTURE W GRAM STAIN (SURGICAL/DEEP WOUND) Hubert Derstine, Thomasene Lot, MD 07/28/2022 1158   B : left femur fracture site Tissue Soft Tissue, Other AEROBIC/ANAEROBIC CULTURE W GRAM STAIN (SURGICAL/DEEP WOUND) Shona Needles, MD 07/28/2022 1201      Implants: * No implants in log *   Indications for Surgery: 87 year old female who underwent revision fixation for hardware failure of her left intertrochanteric femur fracture in September of this year.  She had persistent drainage and presented to the OR for irrigation debridement on November 1.  Unfortunately she persisted with the drainage and a CT scan showed that there was potential sinus tract that had developed.  I recommend proceeding with a deeper irrigation debridement with deep cultures again.  Risks and benefits were discussed with the patient and her daughter.  Risks include but not limited to persistent drainage, infection, nonunion, hardware failure, even the need for subsequent surgeries.  They agreed to proceed with surgery and consent was obtained.  Operative Findings: Irrigation debridement of left hip wound.  Removal of previous hydroxyapatite cement.  Deep cultures of the peri-fracture site x 2  Procedure: The patient was identified in the preoperative holding area.  Consent was confirmed with the patient and their family and all questions were answered. The operative extremity was marked after confirmation with the patient. she was then brought back to the operating room by our anesthesia colleagues.  She was carefully transferred over to radiolucent flattop table.  She was placed under general anesthetic.  A bump was placed under her operative hip.  The left lower extremity was then prepped and draped in usual sterile fashion.  A timeout was performed to verify the patient, the procedure, and the extremity.  Preoperative antibiotics were dosed.  We opened the lateral incision to her thigh.  I extended it proximally distally to be able to access to the fracture site.  I carried it down through skin and subcutaneous tissue.  I encountered a small amount of the cement/hydroxyapatite that was used for reinforcement of the fixation.  This was excised and sent for culture.  I extended it all the way down until I reached the fracture site.  I then debrided some of the fibrous tissue around the fracture site.  There is no gross infection visible.  I sent this tissue for culture.  I then was able to reach and grab portion of the cement/hydroxyapatite that was present and visible on the CT scan.  Once I had the debridement performed I then irrigated with approximately 3 L of low-pressure pulsatile lavage.  I then placed a gram of vancomycin powder 1.2 g of tobramycin powder into the wound.  I placed a medium Hemovac drain that was sewn in.  Deep layer of 0 PDS was used to close the deep layer.  The skin and subcutaneous tissue was closed with 2-0 Monocryl.  3-0 nylon was used to close the remainder of the incision.  An incisional wound VAC was placed to the incision.  The patient was then awoke from anesthesia and taken to the PACU in stable condition.  Post Op Plan/Instructions: Patient will be weightbearing as tolerated left lower extremity.  She will continue with her  antibiotics per infectious disease.  She will discharge home tomorrow after observation.  We will continue the incisional VAC and Hemovac drain until next week.  I was present and performed the entire surgery.  Patrecia Pace, PA-C did assist me throughout the case. An assistant was necessary given the difficulty in approach, maintenance of reduction and ability to instrument the fracture.   Katha Hamming, MD [facility]

## 2022-07-28 NOTE — Interval H&P Note (Signed)
History and Physical Interval Note:  07/28/2022 10:18 AM  Melanie Carrillo  has presented today for surgery, with the diagnosis of Left hip drainage/infection.  The various methods of treatment have been discussed with the patient and family. After consideration of risks, benefits and other options for treatment, the patient has consented to  Procedure(s): IRRIGATION AND DEBRIDEMENT HIP (Left) as a surgical intervention.  The patient's history has been reviewed, patient examined, no change in status, stable for surgery.  I have reviewed the patient's chart and labs.  Questions were answered to the patient's satisfaction.     Lennette Bihari P Minh Jasper

## 2022-07-28 NOTE — Progress Notes (Signed)
Attempted to call report to 6N, unable to take report at this time.

## 2022-07-28 NOTE — Transfer of Care (Signed)
Immediate Anesthesia Transfer of Care Note  Patient: Melanie Carrillo  Procedure(s) Performed: IRRIGATION AND DEBRIDEMENT HIP (Left: Leg Upper)  Patient Location: PACU  Anesthesia Type:General  Level of Consciousness: awake and alert   Airway & Oxygen Therapy: Patient Spontanous Breathing and Patient connected to nasal cannula oxygen  Post-op Assessment: Report given to RN and Post -op Vital signs reviewed and stable  Post vital signs: Reviewed and stable  Last Vitals:  Vitals Value Taken Time  BP 140/68 07/28/22 1245  Temp    Pulse 78 07/28/22 1247  Resp 18 07/28/22 1247  SpO2 94 % 07/28/22 1247  Vitals shown include unvalidated device data.  Last Pain:  Vitals:   07/28/22 0852  TempSrc:   PainSc: 0-No pain      Patients Stated Pain Goal: 0 (19/59/74 7185)  Complications: No notable events documented.

## 2022-07-28 NOTE — Anesthesia Procedure Notes (Signed)
Procedure Name: LMA Insertion Date/Time: 07/28/2022 11:27 AM  Performed by: Santa Lighter, MDPre-anesthesia Checklist: Patient identified, Emergency Drugs available, Suction available and Patient being monitored Patient Re-evaluated:Patient Re-evaluated prior to induction Oxygen Delivery Method: Circle system utilized Preoxygenation: Pre-oxygenation with 100% oxygen Induction Type: IV induction Ventilation: Mask ventilation without difficulty LMA: LMA inserted LMA Size: 4.0 Number of attempts: 1 Placement Confirmation: positive ETCO2 and breath sounds checked- equal and bilateral Tube secured with: Tape Dental Injury: Teeth and Oropharynx as per pre-operative assessment

## 2022-07-28 NOTE — Anesthesia Preprocedure Evaluation (Signed)
Anesthesia Evaluation  Patient identified by MRN, date of birth, ID band Patient awake    Reviewed: Allergy & Precautions, NPO status , Patient's Chart, lab work & pertinent test results  Airway Mallampati: IV  TM Distance: >3 FB Neck ROM: Full    Dental  (+) Dental Advisory Given, Lower Dentures, Upper Dentures   Pulmonary sleep apnea  ILD   Pulmonary exam normal breath sounds clear to auscultation       Cardiovascular hypertension, Pt. on medications (-) angina (-) Past MI and (-) Cardiac Stents Normal cardiovascular exam Rhythm:Regular Rate:Normal     Neuro/Psych  PSYCHIATRIC DISORDERS Anxiety Depression    negative neurological ROS     GI/Hepatic Neg liver ROS,GERD  ,,H/o colon cancer    Endo/Other  negative endocrine ROS    Renal/GU negative Renal ROS     Musculoskeletal  (+) Arthritis ,    Abdominal   Peds  Hematology negative hematology ROS (+)   Anesthesia Other Findings Day of surgery medications reviewed with the patient.  Reproductive/Obstetrics                              Anesthesia Physical Anesthesia Plan  ASA: 3  Anesthesia Plan: General   Post-op Pain Management: Tylenol PO (pre-op)*   Induction: Intravenous  PONV Risk Score and Plan: 3 and Dexamethasone and Ondansetron  Airway Management Planned: Oral ETT  Additional Equipment:   Intra-op Plan:   Post-operative Plan: Extubation in OR  Informed Consent: I have reviewed the patients History and Physical, chart, labs and discussed the procedure including the risks, benefits and alternatives for the proposed anesthesia with the patient or authorized representative who has indicated his/her understanding and acceptance.     Dental advisory given  Plan Discussed with: CRNA  Anesthesia Plan Comments:          Anesthesia Quick Evaluation

## 2022-07-28 NOTE — Interval H&P Note (Signed)
History and Physical Interval Note:  07/28/2022 10:25 AM  Melanie Carrillo  has presented today for surgery, with the diagnosis of Left hip drainage/infection.  The various methods of treatment have been discussed with the patient and family. After consideration of risks, benefits and other options for treatment, the patient has consented to  Procedure(s): IRRIGATION AND DEBRIDEMENT HIP (Left) as a surgical intervention.  The patient's history has been reviewed, patient examined, no change in status, stable for surgery.  I have reviewed the patient's chart and labs.  Questions were answered to the patient's satisfaction.     Lennette Bihari P Orazio Weller

## 2022-07-29 ENCOUNTER — Encounter (HOSPITAL_COMMUNITY): Payer: Self-pay | Admitting: Student

## 2022-07-29 ENCOUNTER — Telehealth: Payer: Medicare HMO | Admitting: Internal Medicine

## 2022-07-29 ENCOUNTER — Other Ambulatory Visit: Payer: Self-pay

## 2022-07-29 DIAGNOSIS — Z85038 Personal history of other malignant neoplasm of large intestine: Secondary | ICD-10-CM | POA: Diagnosis not present

## 2022-07-29 DIAGNOSIS — Y792 Prosthetic and other implants, materials and accessory orthopedic devices associated with adverse incidents: Secondary | ICD-10-CM | POA: Diagnosis not present

## 2022-07-29 DIAGNOSIS — T8452XA Infection and inflammatory reaction due to internal left hip prosthesis, initial encounter: Secondary | ICD-10-CM | POA: Diagnosis not present

## 2022-07-29 DIAGNOSIS — I10 Essential (primary) hypertension: Secondary | ICD-10-CM | POA: Diagnosis not present

## 2022-07-29 DIAGNOSIS — R2689 Other abnormalities of gait and mobility: Secondary | ICD-10-CM | POA: Diagnosis not present

## 2022-07-29 LAB — BASIC METABOLIC PANEL
Anion gap: 6 (ref 5–15)
BUN: 13 mg/dL (ref 8–23)
CO2: 26 mmol/L (ref 22–32)
Calcium: 9.4 mg/dL (ref 8.9–10.3)
Chloride: 103 mmol/L (ref 98–111)
Creatinine, Ser: 0.67 mg/dL (ref 0.44–1.00)
GFR, Estimated: >60 mL/min (ref >60)
Glucose, Bld: 111 mg/dL — ABNORMAL HIGH (ref 70–99)
Potassium: 4.5 mmol/L (ref 3.5–5.1)
Sodium: 135 mmol/L (ref 135–145)

## 2022-07-29 LAB — CBC
HCT: 28.2 % — ABNORMAL LOW (ref 36.0–46.0)
Hemoglobin: 8.9 g/dL — ABNORMAL LOW (ref 12.0–15.0)
MCH: 31.9 pg (ref 26.0–34.0)
MCHC: 31.6 g/dL (ref 30.0–36.0)
MCV: 101.1 fL — ABNORMAL HIGH (ref 80.0–100.0)
Platelets: 266 10*3/uL (ref 150–400)
RBC: 2.79 MIL/uL — ABNORMAL LOW (ref 3.87–5.11)
RDW: 15.5 % (ref 11.5–15.5)
WBC: 6.7 10*3/uL (ref 4.0–10.5)
nRBC: 0 % (ref 0.0–0.2)

## 2022-07-29 NOTE — Progress Notes (Signed)
Explained discharge instructions to patient. Reviewed follow up appointment and next medication administration times. Also reviewed education. Patient verbalized having an understanding for instructions given. All belongings are in the patient's possession.. IV was removed by the patient's RN. No other needs verbalized. Transported downstairs for discharge.

## 2022-07-29 NOTE — Plan of Care (Signed)
  Problem: Education: Goal: Knowledge of General Education information will improve Description: Including pain rating scale, medication(s)/side effects and non-pharmacologic comfort measures Outcome: Adequate for Discharge   Problem: Health Behavior/Discharge Planning: Goal: Ability to manage health-related needs will improve Outcome: Adequate for Discharge   Problem: Clinical Measurements: Goal: Ability to maintain clinical measurements within normal limits will improve Outcome: Adequate for Discharge Goal: Will remain free from infection Outcome: Adequate for Discharge Goal: Diagnostic test results will improve Outcome: Adequate for Discharge Goal: Respiratory complications will improve Outcome: Adequate for Discharge Goal: Cardiovascular complication will be avoided Outcome: Adequate for Discharge   Problem: Activity: Goal: Risk for activity intolerance will decrease Outcome: Adequate for Discharge   Problem: Nutrition: Goal: Adequate nutrition will be maintained Outcome: Adequate for Discharge   Problem: Coping: Goal: Level of anxiety will decrease Outcome: Adequate for Discharge   Problem: Elimination: Goal: Will not experience complications related to bowel motility Outcome: Adequate for Discharge Goal: Will not experience complications related to urinary retention Outcome: Adequate for Discharge   Problem: Pain Managment: Goal: General experience of comfort will improve Outcome: Adequate for Discharge   Problem: Safety: Goal: Ability to remain free from injury will improve Outcome: Adequate for Discharge   Problem: Skin Integrity: Goal: Risk for impaired skin integrity will decrease Outcome: Adequate for Discharge   Problem: Acute Rehab PT Goals(only PT should resolve) Goal: Pt Will Go Supine/Side To Sit Outcome: Adequate for Discharge Goal: Patient Will Transfer Sit To/From Stand Outcome: Adequate for Discharge Goal: Pt Will Ambulate Outcome: Adequate  for Discharge   Problem: Acute Rehab OT Goals (only OT should resolve) Goal: Pt. Will Transfer To Toilet Outcome: Adequate for Discharge Goal: Pt. Will Perform Tub/Shower Transfer Outcome: Adequate for Discharge Goal: OT Additional ADL Goal #1 Outcome: Adequate for Discharge

## 2022-07-29 NOTE — Discharge Instructions (Signed)
Orthopaedic Trauma Service Discharge Instructions   General Discharge Instructions  WEIGHT BEARING STATUS:Weightbearing as tolerated  RANGE OF MOTION/ACTIVITY: ok for hip range of motion as tolerated  Wound Care:Leave the incisional wound vac and hemovac drain in place. Do not get incisional vac wet.   DVT/PE prophylaxis: None  Diet: as you were eating previously.  Can use over the counter stool softeners and bowel preparations, such as Miralax, to help with bowel movements.  Narcotics can be constipating.  Be sure to drink plenty of fluids  PAIN MEDICATION USE AND EXPECTATIONS  You have likely been given narcotic medications to help control your pain.  After a traumatic event that results in an fracture (broken bone) with or without surgery, it is ok to use narcotic pain medications to help control one's pain.  We understand that everyone responds to pain differently and each individual patient will be evaluated on a regular basis for the continued need for narcotic medications. Ideally, narcotic medication use should last no more than 6-8 weeks (coinciding with fracture healing).   As a patient it is your responsibility as well to monitor narcotic medication use and report the amount and frequency you use these medications when you come to your office visit.   We would also advise that if you are using narcotic medications, you should take a dose prior to therapy to maximize you participation.  IF YOU ARE ON NARCOTIC MEDICATIONS IT IS NOT PERMISSIBLE TO OPERATE A MOTOR VEHICLE (MOTORCYCLE/CAR/TRUCK/MOPED) OR HEAVY MACHINERY DO NOT MIX NARCOTICS WITH OTHER CNS (CENTRAL NERVOUS SYSTEM) DEPRESSANTS SUCH AS ALCOHOL   STOP SMOKING OR USING NICOTINE PRODUCTS!!!!  As discussed nicotine severely impairs your body's ability to heal surgical and traumatic wounds but also impairs bone healing.  Wounds and bone heal by forming microscopic blood vessels (angiogenesis) and nicotine is a  vasoconstrictor (essentially, shrinks blood vessels).  Therefore, if vasoconstriction occurs to these microscopic blood vessels they essentially disappear and are unable to deliver necessary nutrients to the healing tissue.  This is one modifiable factor that you can do to dramatically increase your chances of healing your injury.    (This means no smoking, no nicotine gum, patches, etc)  DO NOT USE NONSTEROIDAL ANTI-INFLAMMATORY DRUGS (NSAID'S)  Using products such as Advil (ibuprofen), Aleve (naproxen), Motrin (ibuprofen) for additional pain control during fracture healing can delay and/or prevent the healing response.  If you would like to take over the counter (OTC) medication, Tylenol (acetaminophen) is ok.  However, some narcotic medications that are given for pain control contain acetaminophen as well. Therefore, you should not exceed more than 4000 mg of tylenol in a day if you do not have liver disease.  Also note that there are may OTC medicines, such as cold medicines and allergy medicines that my contain tylenol as well.  If you have any questions about medications and/or interactions please ask your doctor/PA or your pharmacist.      ICE AND ELEVATE INJURED/OPERATIVE EXTREMITY  Using ice and elevating the injured extremity above your heart can help with swelling and pain control.  Icing in a pulsatile fashion, such as 20 minutes on and 20 minutes off, can be followed.    Do not place ice directly on skin. Make sure there is a barrier between to skin and the ice pack.    Using frozen items such as frozen peas works well as the conform nicely to the are that needs to be iced.  USE AN ACE WRAP OR  TED HOSE FOR SWELLING CONTROL  In addition to icing and elevation, Ace wraps or TED hose are used to help limit and resolve swelling.  It is recommended to use Ace wraps or TED hose until you are informed to stop.    When using Ace Wraps start the wrapping distally (farthest away from the body) and  wrap proximally (closer to the body)   Example: If you had surgery on your leg or thing and you do not have a splint on, start the ace wrap at the toes and work your way up to the thigh        If you had surgery on your upper extremity and do not have a splint on, start the ace wrap at your fingers and work your way up to the upper arm  Backus: [phone]   VISIT OUR WEBSITE FOR ADDITIONAL INFORMATION: [email or web address]

## 2022-07-29 NOTE — Discharge Summary (Signed)
Orthopaedic Trauma Service (OTS) Discharge Summary   Patient ID: Melanie Carrillo MRN: [id number] DOB/AGE: Jan 15, 1935 87 y.o.  Admit date: 07/28/2022 Discharge date: 07/29/2022  Admission Diagnoses: Persistent left hip drainage  Discharge Diagnoses:  Principal Problem:   Left hip postoperative wound infection   Past Medical History:  Diagnosis Date   Allergy    Anxiety    Arthritis    Cataract    Colon cancer ([place])    Constipation    Dyspnea    GERD (gastroesophageal reflux disease)    Glaucoma    HLD (hyperlipidemia)    Hypertension    Obstructive sleep apnea 01/29/2014   No Cpap   Osteoporosis    Substance abuse ([place])      Procedures Performed: Irrigation and debridement left hip  Discharged Condition: good  Hospital Course: Patient presented to [facility] on 07/28/2022 for scheduled procedure left lower extremity.  Stated operating room by Dr. Doreatha Martin for the above procedure.  She tolerated this well without complications.  Was admitted to the orthopedic service postoperatively for pain control and observation.  Begin working with physical and Occupational Therapy starting on postoperative day #1. On 07/29/2022, the patient was tolerating diet, working well with therapies, pain well controlled, vital signs stable, dressings clean, dry, intact and felt stable for discharge to home. Patient will follow up as below and knows to call with questions or concerns.     Consults: None  Significant Diagnostic Studies:   Results for orders placed or performed during the hospital encounter of 07/28/22 (from the past 168 hour(s))  Aerobic/Anaerobic Culture w Gram Stain (surgical/deep wound)   Collection Time: 07/28/22 12:01 PM   Specimen: Soft Tissue, Other  Result Value Ref Range   Specimen Description TISSUE    Special Requests  FEMUR A    Gram Stain NO WBC SEEN NO ORGANISMS SEEN     Culture      NO GROWTH < 24 HOURS Performed at [facility] [address], [place], [place] [address]    Report Status PENDING   Aerobic/Anaerobic Culture w Gram Stain (surgical/deep wound)   Collection Time: 07/28/22 12:01 PM   Specimen: Soft Tissue, Other  Result Value Ref Range   Specimen Description TISSUE    Special Requests  FEMUR B    Gram Stain NO WBC SEEN NO ORGANISMS SEEN     Culture      NO GROWTH < 24 HOURS Performed at [facility], [place] [address] [place], [place] [address]    Report Status PENDING   CBC   Collection Time: 07/29/22  3:18 AM  Result Value Ref Range   WBC 6.7 4.0 - 10.5 K/uL   RBC 2.79 (L) 3.87 - 5.11 MIL/uL   Hemoglobin 8.9 (L) 12.0 - 15.0 g/dL   HCT 28.2 (L) 36.0 - 46.0 %   MCV 101.1 (H) 80.0 - 100.0 fL   MCH 31.9 26.0 - 34.0 pg   MCHC 31.6 30.0 - 36.0 g/dL   RDW 15.5 11.5 - 15.5 %   Platelets 266 150 - 400 K/uL   nRBC 0.0 0.0 - 0.2 %  Basic metabolic panel   Collection Time: 07/29/22  3:18 AM  Result Value Ref Range   Sodium 135 135 - 145 mmol/L   Potassium 4.5 3.5 - 5.1 mmol/L   Chloride 103 98 - 111 mmol/L   CO2 26 22 - 32 mmol/L   Glucose, Bld 111 (H) 70 - 99  mg/dL   BUN 13 8 - 23 mg/dL   Creatinine, Ser 0.67 0.44 - 1.00 mg/dL   Calcium 9.4 8.9 - 10.3 mg/dL   GFR, Estimated >60 >60 mL/min   Anion gap 6 5 - 15  Results for orders placed or performed in visit on 07/23/22 (from the past 168 hour(s))  Sedimentation rate   Collection Time: 07/23/22 10:42 AM  Result Value Ref Range   Sed Rate 82 (H) 0 - 30 mm/h  C-reactive protein   Collection Time: 07/23/22 10:42 AM  Result Value Ref Range   CRP 26.9 (H) <8.0 mg/L     Treatments: IV hydration, antibiotics: Cefadroxil and doxycycline, analgesia: acetaminophen and oxycodone, cardiac meds: Losartan, therapies: PT and OT, and surgery: As above  Discharge Exam: General: Sitting up in bedside chair, no acute distress Respiratory: No increased work of breathing at rest Left lower extremity: Incisional VAC with good seal and function.  No  output in canister currently.  Hemovac with serosanguineous/bloody drainage in canister.  Mildly tender over the hip as expected.  Otherwise no significant tenderness with palpation throughout the thigh, knee, lower leg.  Ankle DF/PF intact.  Skin warm and dry.  Compartment soft compressible.  Patient neurovascularly intact  Disposition: Discharge disposition: 01-Home or Self Care       Discharge Instructions     Call MD / Call 911   Complete by: As directed    If you experience chest pain or shortness of breath, CALL 911 and be transported to the hospital emergency room.  If you develope a fever above 101 F, pus (white drainage) or increased drainage or redness at the wound, or calf pain, call your surgeon's office.   Constipation Prevention   Complete by: As directed    Drink plenty of fluids.  Prune juice may be helpful.  You may use a stool softener, such as Colace (over the counter) 100 mg twice a day.  Use MiraLax (over the counter) for constipation as needed.   Diet - low sodium heart healthy   Complete by: As directed    Increase activity slowly as tolerated   Complete by: As directed    Post-operative opioid taper instructions:   Complete by: As directed    POST-OPERATIVE OPIOID TAPER INSTRUCTIONS: It is important to wean off of your opioid medication as soon as possible. If you do not need pain medication after your surgery it is ok to stop day one. Opioids include: Codeine, Hydrocodone(Norco, Vicodin), Oxycodone(Percocet, oxycontin) and hydromorphone amongst others.  Long term and even short term use of opiods can cause: Increased pain response Dependence Constipation Depression Respiratory depression And more.  Withdrawal symptoms can include Flu like symptoms Nausea, vomiting And more Techniques to manage these symptoms Hydrate well Eat regular healthy meals Stay active Use relaxation techniques(deep breathing, meditating, yoga) Do Not substitute Alcohol to  help with tapering If you have been on opioids for less than two weeks and do not have pain than it is ok to stop all together.  Plan to wean off of opioids This plan should start within one week post op of your joint replacement. Maintain the same interval or time between taking each dose and first decrease the dose.  Cut the total daily intake of opioids by one tablet each day Next start to increase the time between doses. The last dose that should be eliminated is the evening dose.         Allergies as of 07/29/2022  No Known Allergies      Medication List     STOP taking these medications    amLODipine 10 MG tablet Commonly known as: NORVASC       TAKE these medications    acetaminophen 325 MG tablet Commonly known as: TYLENOL Take 1-2 tablets (325-650 mg total) by mouth every 6 (six) hours as needed for mild pain (pain score 1-3 or temp > 100.5). What changed:  how much to take when to take this reasons to take this   bisacodyl 5 MG EC tablet Commonly known as: DULCOLAX Take 5 mg by mouth in the morning.   brimonidine 0.2 % ophthalmic solution Commonly known as: ALPHAGAN Place 1 drop into both eyes in the morning and at bedtime.   cefadroxil 500 MG capsule Commonly known as: DURICEF Take 2 capsules (1,000 mg total) by mouth 2 (two) times daily for 21 days.   doxycycline 100 MG tablet Commonly known as: VIBRA-TABS Take 1 tablet (100 mg total) by mouth every 12 (twelve) hours for 21 days.   feeding supplement Liqd Take 237 mLs by mouth 2 (two) times daily between meals. What changed:  when to take this reasons to take this   latanoprost 0.005 % ophthalmic solution Commonly known as: XALATAN Place 1 drop into both eyes at bedtime.   losartan 100 MG tablet Commonly known as: COZAAR Take 100 mg by mouth daily.   lovastatin 40 MG tablet Commonly known as: MEVACOR Take 40 mg by mouth daily with supper.   Melatonin 10 MG Tabs Take 40 mg by mouth  at bedtime as needed (sleep).   meloxicam 7.5 MG tablet Commonly known as: Mobic Take 1 tablet (7.5 mg total) by mouth daily.   montelukast 10 MG tablet Commonly known as: SINGULAIR Take 10 mg by mouth at bedtime.   Mucinex 600 MG 12 hr tablet Generic drug: guaiFENesin Take 600 mg by mouth in the morning and at bedtime.   multivitamin with minerals tablet Take 1 tablet by mouth in the morning.   omeprazole 40 MG capsule Commonly known as: PRILOSEC Take 40 mg by mouth daily before breakfast.   oxyCODONE 5 MG immediate release tablet Commonly known as: Oxy IR/ROXICODONE Take 1 tablet (5 mg total) by mouth every 6 (six) hours as needed for severe pain (not controlled with Tylenol).   sertraline 50 MG tablet Commonly known as: ZOLOFT Take 1 tablet (50 mg total) by mouth daily.   traZODone 50 MG tablet Commonly known as: DESYREL Take 25-50 mg by mouth at bedtime as needed.   Vitamin D-3 125 MCG (5000 UT) Tabs Take 5,000 Units by mouth in the morning.        Follow-up Information     Haddix, Thomasene Lot, MD. Go on 08/03/2022.   Specialty: Orthopedic Surgery Why: 08/03/22 at 11:15AM for drain removal and wound check Contact information: [place] [address] [phone]                 Discharge Instructions and Plan: Patient will be discharged to home.  No formal DVT prophylaxis required.  Patient has all the necessary DME for discharge. Patient will follow up with Dr. Doreatha Martin next week on 08/03/2022 for wound check and Hemovac drain removal.   Signed:  Gwinda Passe, PA-C ?([phone] (phone) 07/29/2022, 8:53 AM  [facility] [place] [address] [phone] Domingo Sep (F)

## 2022-07-29 NOTE — Evaluation (Signed)
Occupational Therapy Evaluation Patient Details Name: Melanie Carrillo MRN: [id number] DOB: Jul 09, 1935 Today's Date: 07/29/2022   History of Present Illness 87 y.o. female presents to [facility] hospital on 07/27/2022 with recurrent L hip infection. Pt with complicated history since hip fx in May 2023, involving multiple repairs and now recurrent infection. Pt underwent I&D on L hip on 07/28/2022. PMH significant for hypertension, GERD, interstitial lung disease.   Clinical Impression   Pt reports that they live with daughter who is a Marine scientist who works from home and can support as needed. Pt at this time completed bed mobility with supervision, sit to stand transfers with supervision to min guard and completed toilet tasks with [facility] with supervision to min guard. Pt currently with functional limitations due to the deficits listed below (see OT Problem List).  Pt will benefit from skilled OT to increase their safety and independence with ADL and functional mobility for ADL to facilitate discharge to venue listed below.       Recommendations for follow up therapy are one component of a multi-disciplinary discharge planning process, led by the attending physician.  Recommendations may be updated based on patient status, additional functional criteria and insurance authorization.   Follow Up Recommendations  No OT follow up     Assistance Recommended at Discharge Intermittent Supervision/Assistance  Patient can return home with the following A little help with bathing/dressing/bathroom;Assistance with cooking/housework;Assist for transportation    Functional Status Assessment  Patient has had a recent decline in their functional status and demonstrates the ability to make significant improvements in function in a reasonable and predictable amount of time.  Equipment Recommendations  None recommended by OT    Recommendations for Other Services       Precautions / Restrictions Precautions Precautions:  Fall Precaution Comments: L hip wound vac, hemovac drain Restrictions Weight Bearing Restrictions: No LLE Weight Bearing: Weight bearing as tolerated Other Position/Activity Restrictions: WBAT LLE      Mobility Bed Mobility Overal bed mobility: Needs Assistance Bed Mobility: Supine to Sit, Sit to Supine     Supine to sit: Supervision Sit to supine: Supervision   General bed mobility comments: increased time    Transfers Overall transfer level: Needs assistance Equipment used: Rolling walker (2 wheels) Transfers: Sit to/from Stand Sit to Stand: Min guard           General transfer comment: needs asist with managemetn of wound vac but pt reproted they have a tray on walker at home they can rest wound vac on      Balance Overall balance assessment: Needs assistance Sitting-balance support: No upper extremity supported, Feet supported Sitting balance-Leahy Scale: Good     Standing balance support: Bilateral upper extremity supported, During functional activity Standing balance-Leahy Scale: Fair                             ADL either performed or assessed with clinical judgement   ADL Overall ADL's : Needs assistance/impaired Eating/Feeding: Independent;Sitting   Grooming: Wash/dry hands;Wash/dry face;Oral care;Independent;Sitting   Upper Body Bathing: Independent;Sitting   Lower Body Bathing: Cueing for safety;Cueing for sequencing;Sit to/from stand;Minimal assistance   Upper Body Dressing : Independent;Sitting   Lower Body Dressing: Minimal assistance;Sit to/from stand   Toilet Transfer: Rolling walker (2 wheels);BSC/3in1;Supervision/safety   Toileting- Clothing Manipulation and Hygiene: Set up;Sit to/from stand       Functional mobility during ADLs: Min guard;Rolling walker (2 wheels)  Vision         Perception     Praxis      Pertinent Vitals/Pain Pain Assessment Pain Assessment: 0-10 Pain Score: 4  Pain Location: L  hip Pain Descriptors / Indicators: Sore Pain Intervention(s): Limited activity within patient's tolerance, Monitored during session, Repositioned     Hand Dominance Right   Extremity/Trunk Assessment Upper Extremity Assessment Upper Extremity Assessment: Overall WFL for tasks assessed   Lower Extremity Assessment Lower Extremity Assessment: Defer to PT evaluation   Cervical / Trunk Assessment Cervical / Trunk Assessment: Normal   Communication Communication Communication: HOH   Cognition Arousal/Alertness: Awake/alert Behavior During Therapy: WFL for tasks assessed/performed Overall Cognitive Status: Within Functional Limits for tasks assessed                                       General Comments       Exercises     Shoulder Instructions      Home Living Family/patient expects to be discharged to:: Private residence Living Arrangements: Other relatives Available Help at Discharge: Family Type of Home: House Home Access: Ramped entrance     Home Layout: One level     Bathroom Shower/Tub: Occupational psychologist: Standard     Home Equipment: Conservation officer, nature (2 wheels);Rollator (4 wheels);Cane - single point;BSC/3in1;Wheelchair - manual;Hospital bed;Other (comment);Adaptive equipment Adaptive Equipment: Long-handled sponge;Reacher Additional Comments: Pt lives with family and able to provide 24/7 care. Pt's daughter is a Marine scientist. Pt has a lift chair.      Prior Functioning/Environment Prior Level of Function : Needs assist             Mobility Comments: ambulatory with RW ADLs Comments: Daughter does apply lotion on lower half but pt can use extended sponge for reaching with bathing and reports she is capable of dressing herself; daughter provides transportation        OT Problem List: Decreased range of motion;Decreased activity tolerance;Impaired balance (sitting and/or standing);Decreased safety awareness;Decreased knowledge  of use of DME or AE;Pain      OT Treatment/Interventions: Self-care/ADL training;DME and/or AE instruction;Therapeutic activities;Patient/family education;Balance training    OT Goals(Current goals can be found in the care plan section) Acute Rehab OT Goals Patient Stated Goal: to get back home soon OT Goal Formulation: With patient Time For Goal Achievement: 08/12/22 Potential to Achieve Goals: Good ADL Goals Pt Will Transfer to Toilet: with supervision;ambulating Pt Will Perform Tub/Shower Transfer: with supervision;ambulating;shower seat;grab bars;rolling walker Additional ADL Goal #1: Pt will tolerate 10 mins of ADLS in sitting to standing to be able to return to home  OT Frequency: Min 2X/week    Co-evaluation              AM-PAC OT "6 Clicks" Daily Activity     Outcome Measure Help from another person eating meals?: None Help from another person taking care of personal grooming?: None Help from another person toileting, which includes using toliet, bedpan, or urinal?: A Little Help from another person bathing (including washing, rinsing, drying)?: A Little Help from another person to put on and taking off regular upper body clothing?: None Help from another person to put on and taking off regular lower body clothing?: A Little 6 Click Score: 21   End of Session Equipment Utilized During Treatment: Gait belt;Rolling walker (2 wheels) Nurse Communication: Mobility status  Activity Tolerance: Patient tolerated treatment well  Patient left: in chair;with call bell/phone within reach;with chair alarm set  OT Visit Diagnosis: Unsteadiness on feet (R26.81);Other abnormalities of gait and mobility (R26.89);Repeated falls (R29.6);Muscle weakness (generalized) (M62.81);Pain Pain - Right/Left: Left Pain - part of body: Hip                Time: 0732-0810 OT Time Calculation (min): 38 min Charges:  OT General Charges $OT Visit: 1 Visit OT Evaluation $OT Eval Low Complexity: 1  Low OT Treatments $Self Care/Home Management : 23-37 mins  Joeseph Amor OTR/L  Acute Rehab Services  [phone] office number    Joeseph Amor 07/29/2022, 8:24 AM

## 2022-07-30 DIAGNOSIS — Z9181 History of falling: Secondary | ICD-10-CM | POA: Diagnosis not present

## 2022-07-30 DIAGNOSIS — R69 Illness, unspecified: Secondary | ICD-10-CM | POA: Diagnosis not present

## 2022-07-30 DIAGNOSIS — Z7901 Long term (current) use of anticoagulants: Secondary | ICD-10-CM | POA: Diagnosis not present

## 2022-07-30 DIAGNOSIS — D649 Anemia, unspecified: Secondary | ICD-10-CM | POA: Diagnosis not present

## 2022-07-30 DIAGNOSIS — I10 Essential (primary) hypertension: Secondary | ICD-10-CM | POA: Diagnosis not present

## 2022-07-30 DIAGNOSIS — H401130 Primary open-angle glaucoma, bilateral, stage unspecified: Secondary | ICD-10-CM | POA: Diagnosis not present

## 2022-07-30 DIAGNOSIS — G4733 Obstructive sleep apnea (adult) (pediatric): Secondary | ICD-10-CM | POA: Diagnosis not present

## 2022-07-30 DIAGNOSIS — E785 Hyperlipidemia, unspecified: Secondary | ICD-10-CM | POA: Diagnosis not present

## 2022-07-30 DIAGNOSIS — J849 Interstitial pulmonary disease, unspecified: Secondary | ICD-10-CM | POA: Diagnosis not present

## 2022-07-30 DIAGNOSIS — K219 Gastro-esophageal reflux disease without esophagitis: Secondary | ICD-10-CM | POA: Diagnosis not present

## 2022-07-30 DIAGNOSIS — T84195D Other mechanical complication of internal fixation device of left femur, subsequent encounter: Secondary | ICD-10-CM | POA: Diagnosis not present

## 2022-07-30 DIAGNOSIS — Z791 Long term (current) use of non-steroidal anti-inflammatories (NSAID): Secondary | ICD-10-CM | POA: Diagnosis not present

## 2022-07-30 DIAGNOSIS — Z993 Dependence on wheelchair: Secondary | ICD-10-CM | POA: Diagnosis not present

## 2022-07-30 DIAGNOSIS — M869 Osteomyelitis, unspecified: Secondary | ICD-10-CM | POA: Diagnosis not present

## 2022-07-30 DIAGNOSIS — Z974 Presence of external hearing-aid: Secondary | ICD-10-CM | POA: Diagnosis not present

## 2022-07-30 DIAGNOSIS — M810 Age-related osteoporosis without current pathological fracture: Secondary | ICD-10-CM | POA: Diagnosis not present

## 2022-07-30 DIAGNOSIS — K5900 Constipation, unspecified: Secondary | ICD-10-CM | POA: Diagnosis not present

## 2022-07-30 DIAGNOSIS — E871 Hypo-osmolality and hyponatremia: Secondary | ICD-10-CM | POA: Diagnosis not present

## 2022-08-02 ENCOUNTER — Other Ambulatory Visit: Payer: Self-pay

## 2022-08-02 ENCOUNTER — Other Ambulatory Visit: Payer: Self-pay | Admitting: Family Medicine

## 2022-08-02 LAB — AEROBIC/ANAEROBIC CULTURE W GRAM STAIN (SURGICAL/DEEP WOUND)
Culture: NO GROWTH
Culture: NO GROWTH
Gram Stain: NONE SEEN
Gram Stain: NONE SEEN

## 2022-08-04 ENCOUNTER — Other Ambulatory Visit: Payer: Self-pay

## 2022-08-04 ENCOUNTER — Ambulatory Visit (INDEPENDENT_AMBULATORY_CARE_PROVIDER_SITE_OTHER): Payer: Medicare HMO | Admitting: Infectious Diseases

## 2022-08-04 ENCOUNTER — Other Ambulatory Visit: Payer: Self-pay | Admitting: Family Medicine

## 2022-08-04 DIAGNOSIS — T8149XA Infection following a procedure, other surgical site, initial encounter: Secondary | ICD-10-CM | POA: Diagnosis not present

## 2022-08-04 DIAGNOSIS — K219 Gastro-esophageal reflux disease without esophagitis: Secondary | ICD-10-CM | POA: Diagnosis not present

## 2022-08-04 MED ORDER — CEFADROXIL 500 MG PO CAPS: 1000.0000 mg | ORAL_CAPSULE | Freq: Two times a day (BID) | ORAL | 0 refills | Status: AC

## 2022-08-04 MED ORDER — TRAZODONE HCL 50 MG PO TABS
25.0000 mg | ORAL_TABLET | Freq: Every evening | ORAL | 0 refills | Status: DC | PRN
  Filled 2022-08-04: qty 60, 60d supply, fill #0

## 2022-08-04 MED ORDER — DOXYCYCLINE HYCLATE 100 MG PO TABS: 100.0000 mg | ORAL_TABLET | Freq: Two times a day (BID) | ORAL | 0 refills | Status: AC

## 2022-08-04 NOTE — Telephone Encounter (Signed)
Rx has already been sent to [facility] by provider today. #60/0  Requested Prescriptions  Pending Prescriptions Disp Refills   traZODone (DESYREL) 50 MG tablet 60 tablet 0    Sig: Take 0.5-1 tablets (25-50 mg total) by mouth at bedtime as needed.     Psychiatry: Antidepressants - Serotonin Modulator Passed - 08/04/2022  3:31 PM      Passed - Completed PHQ-2 or PHQ-9 in the last 360 days      Passed - Valid encounter within last 6 months    Recent Outpatient Visits           3 weeks ago Other fatigue   [place], [place]              Signed Prescriptions Disp Refills   traZODone (DESYREL) 50 MG tablet 60 tablet 0    Sig: Take 0.5-1 tablets (25-50 mg total) by mouth at bedtime as needed.     Psychiatry: Antidepressants - Serotonin Modulator Passed - 08/02/2022 10:03 AM      Passed - Completed PHQ-2 or PHQ-9 in the last 360 days      Passed - Valid encounter within last 6 months    Recent Outpatient Visits           3 weeks ago Other fatigue   [facility] [place], Riki Sheer, MD

## 2022-08-04 NOTE — Addendum Note (Signed)
Addended by: Valli Glance F on: 08/04/2022 03:31 PM   Modules accepted: Orders

## 2022-08-04 NOTE — Telephone Encounter (Signed)
[id number] [id number] Phone: [phone]: [phone]   Pt is out and needs this e-scribed to the pharmacy above

## 2022-08-04 NOTE — Assessment & Plan Note (Signed)
Esophagitis precautions discussed to decrease r/f esophagitis.   DOXYCYCLINE Instructions:  Take twice a day with food (breakfast and dinner) Make sure you drink a full 8 oz glass of water with each dose Sit upright for 1 hour after to prevent heartburn Careful in the sun - can make you more at risk for sun burns (bad ones...) Limit sun exposure between 10-4 Sun block! Don't forget to reapply it Hats Longer sleeves/pants

## 2022-08-04 NOTE — Progress Notes (Signed)
Virtual Visit via Telephone Note  I connected with Melanie Carrillo on 08/04/22 at  2:00 PM EST by telephone and verified that I am speaking with the correct person using two identifiers.  Location: Patient: Melanie Carrillo, her daughter joins [name])  Provider: [facility]    I discussed the limitations, risks, security and privacy concerns of performing an evaluation and management service by telephone and the availability of in person appointments. I also discussed with the patient that there may be a patient responsible charge related to this service. The patient expressed understanding and agreed to proceed.   History of Present Illness: Ms. Strahm is doing well. She has continued both her antibiotics by mouth twice a day. She has had zero side effects from them and tolerating them well. She saw Dr. Doreatha Martin yesterday in ortho clinic - [facility] dressing was removed and overall please with how things were looking. She has some expected post op hip pain from what is described. No fevers/chills.   Wound cultures from surgery 12/06 x 2 are both without any growth of organisms and gram stain was negative. No WBC seen. She was on the same regimen going into this second procedure.     Observations/Objective: Pleasant. Hard of hearing. Easily conversant with normal breathing from what I can tell   Assessment and Plan:  Problem List Items Addressed This Visit       Unprioritized   GERD (gastroesophageal reflux disease)    Esophagitis precautions discussed to decrease r/f esophagitis.   DOXYCYCLINE Instructions:  Take twice a day with food (breakfast and dinner) Make sure you drink a full 8 oz glass of water with each dose Sit upright for 1 hour after to prevent heartburn Careful in the sun - can make you more at risk for sun burns (bad ones...) Limit sun exposure between 10-4 Sun block! Don't forget to reapply it Hats Longer sleeves/pants        Left hip postoperative wound  infection - Primary    Return to OR for second debridement and deep evaluation 12/06 with Dr. Doreatha Martin. Op note reviewed - cement/hydroxyapatite was removed and fracture site was evaluated and debrided of fibrinous tissue; now POD7 and had VAC removed yesterday. We discussed that her cultures were again without growth. Will continue same empiric regimen x 4 weeks. Discussed that it is still not completely certain that infection is contributing but if it is, being her cultures did not grow anything on antibiotics I am encouraged that the organism is being well covered.   I have arranged her follow up with Dr. Juleen China after 4 weeks of treatment on Jan 8th via telephone per their preference.  I will send in 10 more days of antibiotics to cover from her last procedure (EOT: Jan 3)      Relevant Medications   cefadroxil (DURICEF) 500 MG capsule   doxycycline (VIBRA-TABS) 100 MG tablet     Follow Up Instructions: As above    I discussed the assessment and treatment plan with the patient. The patient was provided an opportunity to ask questions and all were answered. The patient agreed with the plan and demonstrated an understanding of the instructions.   The patient was advised to call back or seek an in-person evaluation if the symptoms worsen or if the condition fails to improve as anticipated.  I provided 15 minutes of non-face-to-face time during this encounter.   Janene Madeira, NP

## 2022-08-04 NOTE — Patient Instructions (Signed)
Please continue the two antibiotics (Cefadroxil and Doxycycline) twice a day through January 3rd.

## 2022-08-04 NOTE — Assessment & Plan Note (Addendum)
Return to OR for second debridement and deep evaluation 12/06 with Dr. Doreatha Martin. Op note reviewed - cement/hydroxyapatite was removed and fracture site was evaluated and debrided of fibrinous tissue; now POD7 and had VAC removed yesterday. We discussed that her cultures were again without growth. Will continue same empiric regimen x 4 weeks. Discussed that it is still not completely certain that infection is contributing but if it is, being her cultures did not grow anything on antibiotics I am encouraged that the organism is being well covered.   I have arranged her follow up with Dr. Juleen China after 4 weeks of treatment on Jan 8th via telephone per their preference.  I will send in 10 more days of antibiotics to cover from her last procedure (EOT: Jan 3)

## 2022-08-05 DIAGNOSIS — M810 Age-related osteoporosis without current pathological fracture: Secondary | ICD-10-CM | POA: Diagnosis not present

## 2022-08-05 DIAGNOSIS — K5900 Constipation, unspecified: Secondary | ICD-10-CM | POA: Diagnosis not present

## 2022-08-05 DIAGNOSIS — R69 Illness, unspecified: Secondary | ICD-10-CM | POA: Diagnosis not present

## 2022-08-05 DIAGNOSIS — K219 Gastro-esophageal reflux disease without esophagitis: Secondary | ICD-10-CM | POA: Diagnosis not present

## 2022-08-05 DIAGNOSIS — G4733 Obstructive sleep apnea (adult) (pediatric): Secondary | ICD-10-CM | POA: Diagnosis not present

## 2022-08-05 DIAGNOSIS — E871 Hypo-osmolality and hyponatremia: Secondary | ICD-10-CM | POA: Diagnosis not present

## 2022-08-05 DIAGNOSIS — E785 Hyperlipidemia, unspecified: Secondary | ICD-10-CM | POA: Diagnosis not present

## 2022-08-05 DIAGNOSIS — M869 Osteomyelitis, unspecified: Secondary | ICD-10-CM | POA: Diagnosis not present

## 2022-08-05 DIAGNOSIS — I10 Essential (primary) hypertension: Secondary | ICD-10-CM | POA: Diagnosis not present

## 2022-08-05 DIAGNOSIS — D649 Anemia, unspecified: Secondary | ICD-10-CM | POA: Diagnosis not present

## 2022-08-05 DIAGNOSIS — Z9181 History of falling: Secondary | ICD-10-CM | POA: Diagnosis not present

## 2022-08-05 DIAGNOSIS — Z7901 Long term (current) use of anticoagulants: Secondary | ICD-10-CM | POA: Diagnosis not present

## 2022-08-05 DIAGNOSIS — Z993 Dependence on wheelchair: Secondary | ICD-10-CM | POA: Diagnosis not present

## 2022-08-05 DIAGNOSIS — Z791 Long term (current) use of non-steroidal anti-inflammatories (NSAID): Secondary | ICD-10-CM | POA: Diagnosis not present

## 2022-08-05 DIAGNOSIS — H401130 Primary open-angle glaucoma, bilateral, stage unspecified: Secondary | ICD-10-CM | POA: Diagnosis not present

## 2022-08-05 DIAGNOSIS — J849 Interstitial pulmonary disease, unspecified: Secondary | ICD-10-CM | POA: Diagnosis not present

## 2022-08-05 DIAGNOSIS — Z974 Presence of external hearing-aid: Secondary | ICD-10-CM | POA: Diagnosis not present

## 2022-08-05 DIAGNOSIS — T84195D Other mechanical complication of internal fixation device of left femur, subsequent encounter: Secondary | ICD-10-CM | POA: Diagnosis not present

## 2022-08-08 ENCOUNTER — Other Ambulatory Visit: Payer: Self-pay | Admitting: Family Medicine

## 2022-08-08 DIAGNOSIS — F5102 Adjustment insomnia: Secondary | ICD-10-CM

## 2022-08-09 ENCOUNTER — Telehealth: Payer: Self-pay

## 2022-08-09 ENCOUNTER — Emergency Department: Payer: Medicare HMO

## 2022-08-09 ENCOUNTER — Emergency Department
Admission: EM | Admit: 2022-08-09 | Discharge: 2022-08-09 | Discharge status: Against Medical Advice | Payer: Medicare HMO | Attending: Emergency Medicine | Admitting: Emergency Medicine

## 2022-08-09 DIAGNOSIS — R079 Chest pain, unspecified: Secondary | ICD-10-CM | POA: Insufficient documentation

## 2022-08-09 DIAGNOSIS — Z5321 Procedure and treatment not carried out due to patient leaving prior to being seen by health care provider: Secondary | ICD-10-CM | POA: Diagnosis not present

## 2022-08-09 LAB — BASIC METABOLIC PANEL
Anion gap: 11 (ref 5–15)
BUN: 10 mg/dL (ref 8–23)
CO2: 22 mmol/L (ref 22–32)
Calcium: 9.8 mg/dL (ref 8.9–10.3)
Chloride: 99 mmol/L (ref 98–111)
Creatinine, Ser: 0.48 mg/dL (ref 0.44–1.00)
GFR, Estimated: >60 mL/min (ref >60)
Glucose, Bld: 176 mg/dL — ABNORMAL HIGH (ref 70–99)
Potassium: 3.7 mmol/L (ref 3.5–5.1)
Sodium: 132 mmol/L — ABNORMAL LOW (ref 135–145)

## 2022-08-09 LAB — CBC
HCT: 36.2 % (ref 36.0–46.0)
Hemoglobin: 11.4 g/dL — ABNORMAL LOW (ref 12.0–15.0)
MCH: 31.5 pg (ref 26.0–34.0)
MCHC: 31.5 g/dL (ref 30.0–36.0)
MCV: 100 fL (ref 80.0–100.0)
Platelets: 365 10*3/uL (ref 150–400)
RBC: 3.62 MIL/uL — ABNORMAL LOW (ref 3.87–5.11)
RDW: 15 % (ref 11.5–15.5)
WBC: 12 10*3/uL — ABNORMAL HIGH (ref 4.0–10.5)
nRBC: 0 % (ref 0.0–0.2)

## 2022-08-09 LAB — TROPONIN I (HIGH SENSITIVITY): Troponin I (High Sensitivity): 13 ng/L (ref <18)

## 2022-08-09 NOTE — ED Triage Notes (Signed)
Pt presents to the the ED via POV with daughter due to CP that started this morning. The pain is radiating around to pt's back. Pt denies diarrhea. Pt states no relief with pain medications. Pt A&Ox4.

## 2022-08-09 NOTE — Telephone Encounter (Signed)
Spoke with Melanie Carrillo, they are agreeable to come in for first available appointment which is tomorrow. Advised that if coffee ground emesis worsens or patient begins throwing up blood that she will need to go to the ED. Advised that Dr. Juleen China would like her to stop the doxycycline for now.   Suggested they contact PCP to see if POC Hbg test could be done today. Melanie Carrillo does not think they will be able to get an appointment today and would prefer to have labs drawn at tomorrow's appointment.   Asked her to please call with any changes in [name]'s status.   Beryle Flock, RN

## 2022-08-09 NOTE — Telephone Encounter (Signed)
Unable to refill per protocol, last refill by provider 08/04/22 for 60. Will refuse duplicate request.  Requested Prescriptions  Pending Prescriptions Disp Refills   traZODone (DESYREL) 50 MG tablet [Pharmacy Med Name: TRAZODONE 50 MG TABLET] 60 tablet 0    Sig: TAKE 0.5-1 TABLETS BY MOUTH AT BEDTIME AS NEEDED FOR SLEEP.     Psychiatry: Antidepressants - Serotonin Modulator Passed - 08/08/2022  8:15 AM      Passed - Completed PHQ-2 or PHQ-9 in the last 360 days      Passed - Valid encounter within last 6 months    Recent Outpatient Visits           3 weeks ago Other fatigue   [place], [place], [place]       Future Appointments             Tomorrow Melanie Carrillo, Melanie Krebs, NP [facility]

## 2022-08-09 NOTE — Telephone Encounter (Signed)
Received voicemail from patient's daughter, Melanie Carrillo, reporting that her mother is not doing well on the antibiotics (8:37 am). Returned call at 9:18 am.   Melanie Carrillo has been complaining of abdominal discomfort and nausea for the last 4-5 days. She vomited what Melanie Carrillo describes as "coffee ground emesis" yesterday and this morning.   Melanie Carrillo has not had an appetite and her PO intake has been minimal. Denies symptoms of dizziness and pallor. Melanie Carrillo took her antibiotics last night, but is holding them this morning.   RN expressed concern for internal bleeding due to coffee ground emesis. Both patient and her daughter do not want to go to the emergency department due to the long wait times they experienced when she had a femur fracture.   Beryle Flock, RN

## 2022-08-10 ENCOUNTER — Other Ambulatory Visit: Payer: Self-pay

## 2022-08-10 ENCOUNTER — Encounter: Payer: Self-pay | Admitting: Infectious Diseases

## 2022-08-10 ENCOUNTER — Ambulatory Visit: Payer: Medicare HMO | Admitting: Infectious Diseases

## 2022-08-10 VITALS — BP 145/76 | HR 66 | Temp 97.1°F | Ht 62.0 in | Wt 151.0 lb

## 2022-08-10 DIAGNOSIS — T148XXA Other injury of unspecified body region, initial encounter: Secondary | ICD-10-CM

## 2022-08-10 DIAGNOSIS — Z9189 Other specified personal risk factors, not elsewhere classified: Secondary | ICD-10-CM | POA: Diagnosis not present

## 2022-08-10 DIAGNOSIS — L089 Local infection of the skin and subcutaneous tissue, unspecified: Secondary | ICD-10-CM

## 2022-08-10 MED ORDER — PANTOPRAZOLE SODIUM 40 MG PO TBEC: 40.0000 mg | DELAYED_RELEASE_TABLET | Freq: Every day | ORAL | 0 refills | Status: DC

## 2022-08-10 NOTE — Assessment & Plan Note (Signed)
ER work up reviewed - cardiopulmary all normal. Her symptoms of chest pains have resolved. I suspect this may all be due to esophagitis +/- ulcerations from chronic doxycycline use. Meloxicam was also being taken - her daughter stopped this as well, which I completely agree with.   CBC appears hemo concentrated from poor PO intake but Hgb remains > 11 and no further symptoms of bleeding.   Counseled to avoid further doxycycline use. Will add pantoprazole 40 mg daily x 8 weeks with concern over possible erosions; may be enough to just withdraw offending med, but will proceed conservatively.   Tylenol for aches / pains - avoid NSAIDS/ASA products and further meloxicam.   FU as scheduled with Dr. Juleen China in January as scheduleded.

## 2022-08-10 NOTE — Patient Instructions (Addendum)
Only do the cefadroxil to finish out  Your hemoglobin is great still.   Agree to stop the meloxicam   Wound looks great - no concern for ongoing infection. Keep an eye on it!  Pantoprazole once a day before breakfast for 8 weeks.   Stick with bland options for food  Oatmeal is great,   Add ensure in between meals to help with increasing your protein.   See Dr. Juleen China on Jan 8th as currently scheduled.

## 2022-08-10 NOTE — Assessment & Plan Note (Signed)
Surgical wound is open to air, dry and healing well. Some scab present but surrounding skin is intact and w/o signs of infection. Sutures remain.  She will continue the cefadroxil alone through planned completion date.

## 2022-08-10 NOTE — Progress Notes (Signed)
Patient: Melanie Carrillo  DOB: 06-Aug-1935 MRN: [id number] PCP: [name], MD  Referring Provider:   Chief Complaint  Patient presents with   Follow-up     Subjective:  Melanie Carrillo is a 87 y.o. here with her daughter today for evaluation.   She called triage yesterday to report coffee ground emesis x 1, N/V and abdominal discomfort for several days. Decreased appetite and not taking in much PO. No further episodes of coffee ground emesis. Yesterday she had only 1 cup of yogurt and cheez it crackers. Today she has had 1/2 pack of quaker oatmeal. Drinks water regularly even during this current stomach problem.   Presented to ER yesterday for evaluation of chest pain - she left without being seen by provider.  Prelminary labs with WBC 12, Hgb 11 (up from 8 twelve days prior), Troponon negative x 1, CXR w/o acute process. 12-lead EKG normal.   She feels much improved today fortunately. Upper abdominal/chest pain has resolved. She has lost about 10 pounds.    Review of Systems  Constitutional:  Positive for weight loss.  Respiratory:  Negative for cough.   Cardiovascular:  Positive for chest pain.  Gastrointestinal:  Positive for abdominal pain, constipation, heartburn, nausea and vomiting. Negative for diarrhea.  Genitourinary: Negative.   Skin:  Negative for rash.     Past Medical History:  Diagnosis Date   Allergy    Anxiety    Arthritis    Cataract    Colon cancer ([place])    Constipation    Dyspnea    GERD (gastroesophageal reflux disease)    Glaucoma    HLD (hyperlipidemia)    Hypertension    Obstructive sleep apnea 01/29/2014   No Cpap   Osteoporosis    Substance abuse ([place])     Outpatient Medications Prior to Visit  Medication Sig Dispense Refill   acetaminophen (TYLENOL) 325 MG tablet Take 1-2 tablets (325-650 mg total) by mouth every 6 (six) hours as needed for mild pain (pain score 1-3 or temp > 100.5). (Patient taking differently: Take  650-1,300 mg by mouth as needed (pain).) 60 tablet 0   bisacodyl (DULCOLAX) 5 MG EC tablet Take 5 mg by mouth in the morning.     brimonidine (ALPHAGAN) 0.2 % ophthalmic solution Place 1 drop into both eyes in the morning and at bedtime.     Cholecalciferol (VITAMIN D-3) 125 MCG (5000 UT) TABS Take 5,000 Units by mouth in the morning.     feeding supplement (ENSURE ENLIVE / ENSURE PLUS) LIQD Take 237 mLs by mouth 2 (two) times daily between meals. (Patient taking differently: Take 237 mLs by mouth daily as needed (nutrition supplement).) 237 mL 12   guaiFENesin (MUCINEX) 600 MG 12 hr tablet Take 600 mg by mouth in the morning and at bedtime.     latanoprost (XALATAN) 0.005 % ophthalmic solution Place 1 drop into both eyes at bedtime.     losartan (COZAAR) 100 MG tablet Take 100 mg by mouth daily.     lovastatin (MEVACOR) 40 MG tablet Take 40 mg by mouth daily with supper.     Melatonin 10 MG TABS Take 40 mg by mouth at bedtime as needed (sleep).     montelukast (SINGULAIR) 10 MG tablet Take 10 mg by mouth at bedtime.     Multiple Vitamins-Minerals (MULTIVITAMIN WITH MINERALS) tablet Take 1 tablet by mouth in the morning.     omeprazole (PRILOSEC) 40 MG capsule Take 40 mg  by mouth daily before breakfast.     oxyCODONE (OXY IR/ROXICODONE) 5 MG immediate release tablet Take 1 tablet (5 mg total) by mouth every 6 (six) hours as needed for severe pain (not controlled with Tylenol). 10 tablet 0   sertraline (ZOLOFT) 50 MG tablet Take 1 tablet (50 mg total) by mouth daily. 90 tablet 1   traZODone (DESYREL) 50 MG tablet Take 0.5-1 tablets (25-50 mg total) by mouth at bedtime as needed. 60 tablet 0   cefadroxil (DURICEF) 500 MG capsule Take 2 capsules (1,000 mg total) by mouth 2 (two) times daily for 12 days. (Patient not taking: Reported on 08/10/2022) 48 capsule 0   doxycycline (VIBRA-TABS) 100 MG tablet Take 1 tablet (100 mg total) by mouth every 12 (twelve) hours for 12 days. (Patient not taking:  Reported on 08/10/2022) 24 tablet 0   meloxicam (MOBIC) 7.5 MG tablet Take 1 tablet (7.5 mg total) by mouth daily. (Patient not taking: Reported on 07/28/2022) 30 tablet 2   No facility-administered medications prior to visit.     No Known Allergies  Social History   Tobacco Use   Smoking status: Never   Smokeless tobacco: Never  Vaping Use   Vaping Use: Never used  Substance Use Topics   Alcohol use: Never   Drug use: Never    Family History  Problem Relation Age of Onset   Hypertension Mother    Heart disease Mother    Heart disease Father     Objective:   Vitals:   08/10/22 1538  BP: (!) 145/76  Pulse: 66  Temp: (!) 97.1 F (36.2 C)  TempSrc: Temporal  SpO2: 99%  Weight: 151 lb (68.5 kg)  Height: '5\' 2"'$  (1.575 m)   Body mass index is 27.62 kg/m.  Physical Exam Constitutional:      Appearance: Normal appearance. She is not ill-appearing.  HENT:     Mouth/Throat:     Mouth: Mucous membranes are moist.     Pharynx: Oropharynx is clear.  Cardiovascular:     Rate and Rhythm: Normal rate.  Pulmonary:     Effort: Pulmonary effort is normal.  Abdominal:     General: There is no distension.     Tenderness: There is no abdominal tenderness.  Musculoskeletal:     Comments: Surgical wound is open to air, dry and healing well. Some scab present but surrounding skin is intact and w/o signs of infection. Sutures remain.   Neurological:     Mental Status: She is alert and oriented to person, place, and time.     Lab Results: Lab Results  Component Value Date   WBC 12.0 (H) 08/09/2022   HGB 11.4 (L) 08/09/2022   HCT 36.2 08/09/2022   MCV 100.0 08/09/2022   PLT 365 08/09/2022    Lab Results  Component Value Date   CREATININE 0.48 08/09/2022   BUN 10 08/09/2022   NA 132 (L) 08/09/2022   K 3.7 08/09/2022   CL 99 08/09/2022   CO2 22 08/09/2022    Lab Results  Component Value Date   ALT 11 07/13/2022   AST 20 07/13/2022   ALKPHOS 118 07/13/2022    BILITOT 0.2 07/13/2022     Assessment & Plan:   Problem List Items Addressed This Visit       Unprioritized   At risk for pill esophagitis    ER work up reviewed - cardiopulmary all normal. Her symptoms of chest pains have resolved. I suspect this may all be  due to esophagitis +/- ulcerations from chronic doxycycline use. Meloxicam was also being taken - her daughter stopped this as well, which I completely agree with.   CBC appears hemo concentrated from poor PO intake but Hgb remains > 11 and no further symptoms of bleeding.   Counseled to avoid further doxycycline use. Will add pantoprazole 40 mg daily x 8 weeks with concern over possible erosions; may be enough to just withdraw offending med, but will proceed conservatively.   Tylenol for aches / pains - avoid NSAIDS/ASA products and further meloxicam.   FU as scheduled with Dr. Juleen China in January as scheduleded.       Wound infection - Primary    Surgical wound is open to air, dry and healing well. Some scab present but surrounding skin is intact and w/o signs of infection. Sutures remain.  She will continue the cefadroxil alone through planned completion date.         Janene Madeira, MSN, NP-C [facility] for [facility] [place] Pager: [phone] Office: [phone]  08/10/22  4:27 PM

## 2022-08-11 DIAGNOSIS — M6281 Muscle weakness (generalized): Secondary | ICD-10-CM | POA: Diagnosis not present

## 2022-08-11 DIAGNOSIS — I10 Essential (primary) hypertension: Secondary | ICD-10-CM | POA: Diagnosis not present

## 2022-08-19 DIAGNOSIS — M810 Age-related osteoporosis without current pathological fracture: Secondary | ICD-10-CM | POA: Diagnosis not present

## 2022-08-19 DIAGNOSIS — E559 Vitamin D deficiency, unspecified: Secondary | ICD-10-CM | POA: Diagnosis not present

## 2022-08-20 DIAGNOSIS — Z791 Long term (current) use of non-steroidal anti-inflammatories (NSAID): Secondary | ICD-10-CM | POA: Diagnosis not present

## 2022-08-20 DIAGNOSIS — T84195D Other mechanical complication of internal fixation device of left femur, subsequent encounter: Secondary | ICD-10-CM | POA: Diagnosis not present

## 2022-08-20 DIAGNOSIS — Z993 Dependence on wheelchair: Secondary | ICD-10-CM | POA: Diagnosis not present

## 2022-08-20 DIAGNOSIS — I10 Essential (primary) hypertension: Secondary | ICD-10-CM | POA: Diagnosis not present

## 2022-08-20 DIAGNOSIS — H401130 Primary open-angle glaucoma, bilateral, stage unspecified: Secondary | ICD-10-CM | POA: Diagnosis not present

## 2022-08-20 DIAGNOSIS — J849 Interstitial pulmonary disease, unspecified: Secondary | ICD-10-CM | POA: Diagnosis not present

## 2022-08-20 DIAGNOSIS — R69 Illness, unspecified: Secondary | ICD-10-CM | POA: Diagnosis not present

## 2022-08-20 DIAGNOSIS — M810 Age-related osteoporosis without current pathological fracture: Secondary | ICD-10-CM | POA: Diagnosis not present

## 2022-08-20 DIAGNOSIS — K219 Gastro-esophageal reflux disease without esophagitis: Secondary | ICD-10-CM | POA: Diagnosis not present

## 2022-08-20 DIAGNOSIS — M869 Osteomyelitis, unspecified: Secondary | ICD-10-CM | POA: Diagnosis not present

## 2022-08-20 DIAGNOSIS — E871 Hypo-osmolality and hyponatremia: Secondary | ICD-10-CM | POA: Diagnosis not present

## 2022-08-20 DIAGNOSIS — Z974 Presence of external hearing-aid: Secondary | ICD-10-CM | POA: Diagnosis not present

## 2022-08-20 DIAGNOSIS — D649 Anemia, unspecified: Secondary | ICD-10-CM | POA: Diagnosis not present

## 2022-08-20 DIAGNOSIS — Z7901 Long term (current) use of anticoagulants: Secondary | ICD-10-CM | POA: Diagnosis not present

## 2022-08-20 DIAGNOSIS — Z9181 History of falling: Secondary | ICD-10-CM | POA: Diagnosis not present

## 2022-08-20 DIAGNOSIS — G4733 Obstructive sleep apnea (adult) (pediatric): Secondary | ICD-10-CM | POA: Diagnosis not present

## 2022-08-20 DIAGNOSIS — K5900 Constipation, unspecified: Secondary | ICD-10-CM | POA: Diagnosis not present

## 2022-08-20 DIAGNOSIS — E785 Hyperlipidemia, unspecified: Secondary | ICD-10-CM | POA: Diagnosis not present

## 2022-08-24 DIAGNOSIS — K219 Gastro-esophageal reflux disease without esophagitis: Secondary | ICD-10-CM | POA: Diagnosis not present

## 2022-08-24 DIAGNOSIS — Z9181 History of falling: Secondary | ICD-10-CM | POA: Diagnosis not present

## 2022-08-24 DIAGNOSIS — Z993 Dependence on wheelchair: Secondary | ICD-10-CM | POA: Diagnosis not present

## 2022-08-24 DIAGNOSIS — Z791 Long term (current) use of non-steroidal anti-inflammatories (NSAID): Secondary | ICD-10-CM | POA: Diagnosis not present

## 2022-08-24 DIAGNOSIS — J849 Interstitial pulmonary disease, unspecified: Secondary | ICD-10-CM | POA: Diagnosis not present

## 2022-08-24 DIAGNOSIS — M810 Age-related osteoporosis without current pathological fracture: Secondary | ICD-10-CM | POA: Diagnosis not present

## 2022-08-24 DIAGNOSIS — T84195D Other mechanical complication of internal fixation device of left femur, subsequent encounter: Secondary | ICD-10-CM | POA: Diagnosis not present

## 2022-08-24 DIAGNOSIS — I10 Essential (primary) hypertension: Secondary | ICD-10-CM | POA: Diagnosis not present

## 2022-08-24 DIAGNOSIS — H401130 Primary open-angle glaucoma, bilateral, stage unspecified: Secondary | ICD-10-CM | POA: Diagnosis not present

## 2022-08-24 DIAGNOSIS — G4733 Obstructive sleep apnea (adult) (pediatric): Secondary | ICD-10-CM | POA: Diagnosis not present

## 2022-08-24 DIAGNOSIS — Z974 Presence of external hearing-aid: Secondary | ICD-10-CM | POA: Diagnosis not present

## 2022-08-24 DIAGNOSIS — R69 Illness, unspecified: Secondary | ICD-10-CM | POA: Diagnosis not present

## 2022-08-24 DIAGNOSIS — D649 Anemia, unspecified: Secondary | ICD-10-CM | POA: Diagnosis not present

## 2022-08-24 DIAGNOSIS — K5900 Constipation, unspecified: Secondary | ICD-10-CM | POA: Diagnosis not present

## 2022-08-24 DIAGNOSIS — E871 Hypo-osmolality and hyponatremia: Secondary | ICD-10-CM | POA: Diagnosis not present

## 2022-08-24 DIAGNOSIS — Z7901 Long term (current) use of anticoagulants: Secondary | ICD-10-CM | POA: Diagnosis not present

## 2022-08-24 DIAGNOSIS — M869 Osteomyelitis, unspecified: Secondary | ICD-10-CM | POA: Diagnosis not present

## 2022-08-24 DIAGNOSIS — E785 Hyperlipidemia, unspecified: Secondary | ICD-10-CM | POA: Diagnosis not present

## 2022-08-30 ENCOUNTER — Other Ambulatory Visit: Payer: Self-pay

## 2022-08-30 ENCOUNTER — Ambulatory Visit (INDEPENDENT_AMBULATORY_CARE_PROVIDER_SITE_OTHER): Payer: Medicare HMO | Admitting: Internal Medicine

## 2022-08-30 DIAGNOSIS — T148XXA Other injury of unspecified body region, initial encounter: Secondary | ICD-10-CM

## 2022-08-30 DIAGNOSIS — L089 Local infection of the skin and subcutaneous tissue, unspecified: Secondary | ICD-10-CM

## 2022-08-30 NOTE — Progress Notes (Signed)
ID Follow Up Note:  I talked with patient and her daughter today via telephone.  She reports doing well today and her incision is healing well.  She gets her stitches out tomorrow and there has been no issues with drainage.  She completed her antibiotics as planned and has no further needs identified today.  Will have her follow up with [place] as needed.     Raynelle Highland for Infectious [facility] 08/30/2022, 4:09 PM   NO CHARGE PHONE VISIT.

## 2022-08-31 DIAGNOSIS — S72142D Displaced intertrochanteric fracture of left femur, subsequent encounter for closed fracture with routine healing: Secondary | ICD-10-CM | POA: Diagnosis not present

## 2022-08-31 DIAGNOSIS — H524 Presbyopia: Secondary | ICD-10-CM | POA: Diagnosis not present

## 2022-08-31 DIAGNOSIS — H40153 Residual stage of open-angle glaucoma, bilateral: Secondary | ICD-10-CM | POA: Diagnosis not present

## 2022-08-31 DIAGNOSIS — M869 Osteomyelitis, unspecified: Secondary | ICD-10-CM | POA: Diagnosis not present

## 2022-09-01 DIAGNOSIS — I10 Essential (primary) hypertension: Secondary | ICD-10-CM | POA: Diagnosis not present

## 2022-09-01 DIAGNOSIS — K219 Gastro-esophageal reflux disease without esophagitis: Secondary | ICD-10-CM | POA: Diagnosis not present

## 2022-09-01 DIAGNOSIS — Z791 Long term (current) use of non-steroidal anti-inflammatories (NSAID): Secondary | ICD-10-CM | POA: Diagnosis not present

## 2022-09-01 DIAGNOSIS — M810 Age-related osteoporosis without current pathological fracture: Secondary | ICD-10-CM | POA: Diagnosis not present

## 2022-09-01 DIAGNOSIS — D649 Anemia, unspecified: Secondary | ICD-10-CM | POA: Diagnosis not present

## 2022-09-01 DIAGNOSIS — K5900 Constipation, unspecified: Secondary | ICD-10-CM | POA: Diagnosis not present

## 2022-09-01 DIAGNOSIS — J849 Interstitial pulmonary disease, unspecified: Secondary | ICD-10-CM | POA: Diagnosis not present

## 2022-09-01 DIAGNOSIS — Z993 Dependence on wheelchair: Secondary | ICD-10-CM | POA: Diagnosis not present

## 2022-09-01 DIAGNOSIS — Z7901 Long term (current) use of anticoagulants: Secondary | ICD-10-CM | POA: Diagnosis not present

## 2022-09-01 DIAGNOSIS — E871 Hypo-osmolality and hyponatremia: Secondary | ICD-10-CM | POA: Diagnosis not present

## 2022-09-01 DIAGNOSIS — M869 Osteomyelitis, unspecified: Secondary | ICD-10-CM | POA: Diagnosis not present

## 2022-09-01 DIAGNOSIS — Z9181 History of falling: Secondary | ICD-10-CM | POA: Diagnosis not present

## 2022-09-01 DIAGNOSIS — E785 Hyperlipidemia, unspecified: Secondary | ICD-10-CM | POA: Diagnosis not present

## 2022-09-01 DIAGNOSIS — R69 Illness, unspecified: Secondary | ICD-10-CM | POA: Diagnosis not present

## 2022-09-01 DIAGNOSIS — Z974 Presence of external hearing-aid: Secondary | ICD-10-CM | POA: Diagnosis not present

## 2022-09-01 DIAGNOSIS — H401130 Primary open-angle glaucoma, bilateral, stage unspecified: Secondary | ICD-10-CM | POA: Diagnosis not present

## 2022-09-01 DIAGNOSIS — T84195D Other mechanical complication of internal fixation device of left femur, subsequent encounter: Secondary | ICD-10-CM | POA: Diagnosis not present

## 2022-09-01 DIAGNOSIS — G4733 Obstructive sleep apnea (adult) (pediatric): Secondary | ICD-10-CM | POA: Diagnosis not present

## 2022-09-08 DIAGNOSIS — R69 Illness, unspecified: Secondary | ICD-10-CM | POA: Diagnosis not present

## 2022-09-08 DIAGNOSIS — E785 Hyperlipidemia, unspecified: Secondary | ICD-10-CM | POA: Diagnosis not present

## 2022-09-08 DIAGNOSIS — H401130 Primary open-angle glaucoma, bilateral, stage unspecified: Secondary | ICD-10-CM | POA: Diagnosis not present

## 2022-09-08 DIAGNOSIS — G4733 Obstructive sleep apnea (adult) (pediatric): Secondary | ICD-10-CM | POA: Diagnosis not present

## 2022-09-08 DIAGNOSIS — T84195D Other mechanical complication of internal fixation device of left femur, subsequent encounter: Secondary | ICD-10-CM | POA: Diagnosis not present

## 2022-09-08 DIAGNOSIS — J849 Interstitial pulmonary disease, unspecified: Secondary | ICD-10-CM | POA: Diagnosis not present

## 2022-09-08 DIAGNOSIS — Z791 Long term (current) use of non-steroidal anti-inflammatories (NSAID): Secondary | ICD-10-CM | POA: Diagnosis not present

## 2022-09-08 DIAGNOSIS — E871 Hypo-osmolality and hyponatremia: Secondary | ICD-10-CM | POA: Diagnosis not present

## 2022-09-08 DIAGNOSIS — I10 Essential (primary) hypertension: Secondary | ICD-10-CM | POA: Diagnosis not present

## 2022-09-08 DIAGNOSIS — K219 Gastro-esophageal reflux disease without esophagitis: Secondary | ICD-10-CM | POA: Diagnosis not present

## 2022-09-08 DIAGNOSIS — M869 Osteomyelitis, unspecified: Secondary | ICD-10-CM | POA: Diagnosis not present

## 2022-09-08 DIAGNOSIS — M810 Age-related osteoporosis without current pathological fracture: Secondary | ICD-10-CM | POA: Diagnosis not present

## 2022-09-08 DIAGNOSIS — Z974 Presence of external hearing-aid: Secondary | ICD-10-CM | POA: Diagnosis not present

## 2022-09-08 DIAGNOSIS — Z9181 History of falling: Secondary | ICD-10-CM | POA: Diagnosis not present

## 2022-09-08 DIAGNOSIS — Z7901 Long term (current) use of anticoagulants: Secondary | ICD-10-CM | POA: Diagnosis not present

## 2022-09-08 DIAGNOSIS — K5900 Constipation, unspecified: Secondary | ICD-10-CM | POA: Diagnosis not present

## 2022-09-08 DIAGNOSIS — Z993 Dependence on wheelchair: Secondary | ICD-10-CM | POA: Diagnosis not present

## 2022-09-08 DIAGNOSIS — D649 Anemia, unspecified: Secondary | ICD-10-CM | POA: Diagnosis not present

## 2022-09-11 DIAGNOSIS — M6281 Muscle weakness (generalized): Secondary | ICD-10-CM | POA: Diagnosis not present

## 2022-09-11 DIAGNOSIS — I10 Essential (primary) hypertension: Secondary | ICD-10-CM | POA: Diagnosis not present

## 2022-09-11 IMAGING — CR DG HIP (WITH OR WITHOUT PELVIS) 2-3V*L*
3 series · 3 of 3 positions shown · non-contrast
Comparison: None Available.

CLINICAL DATA: Pain post fall

EXAM:
DG HIP (WITH OR WITHOUT PELVIS) 2-3V LEFT

[pelvis ap]
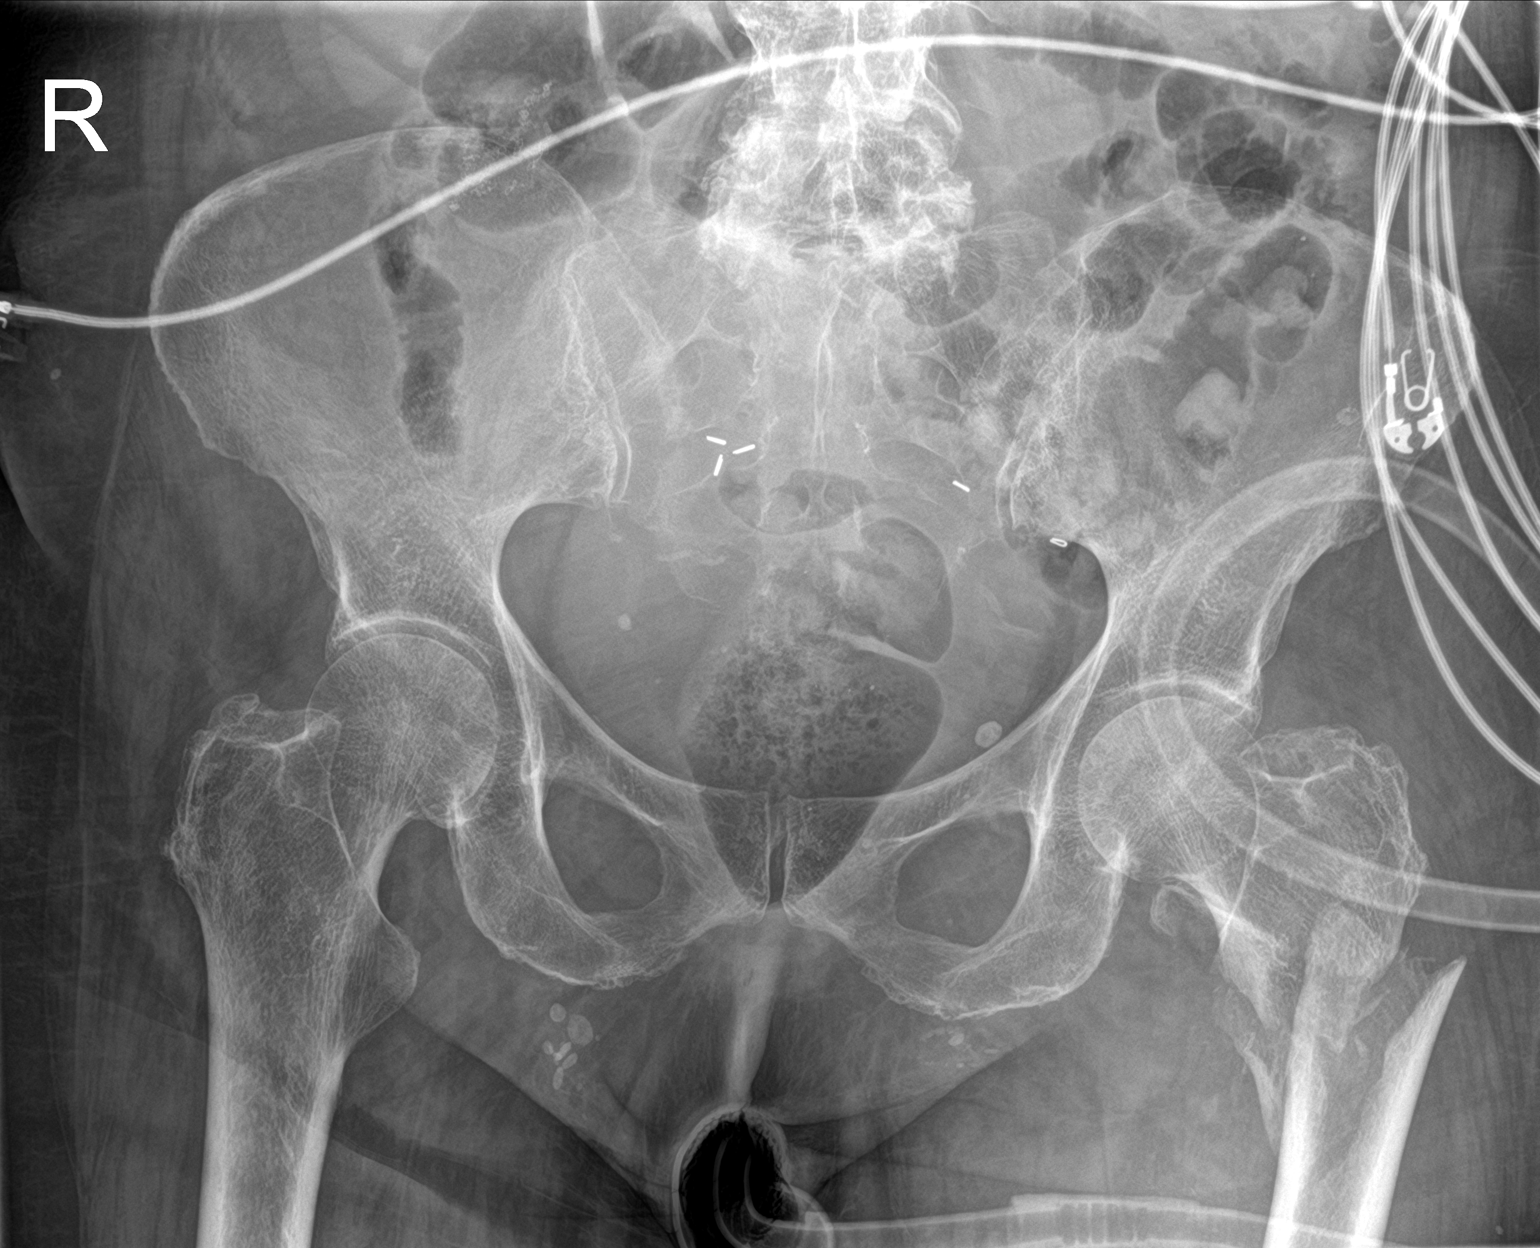

[hip ap]
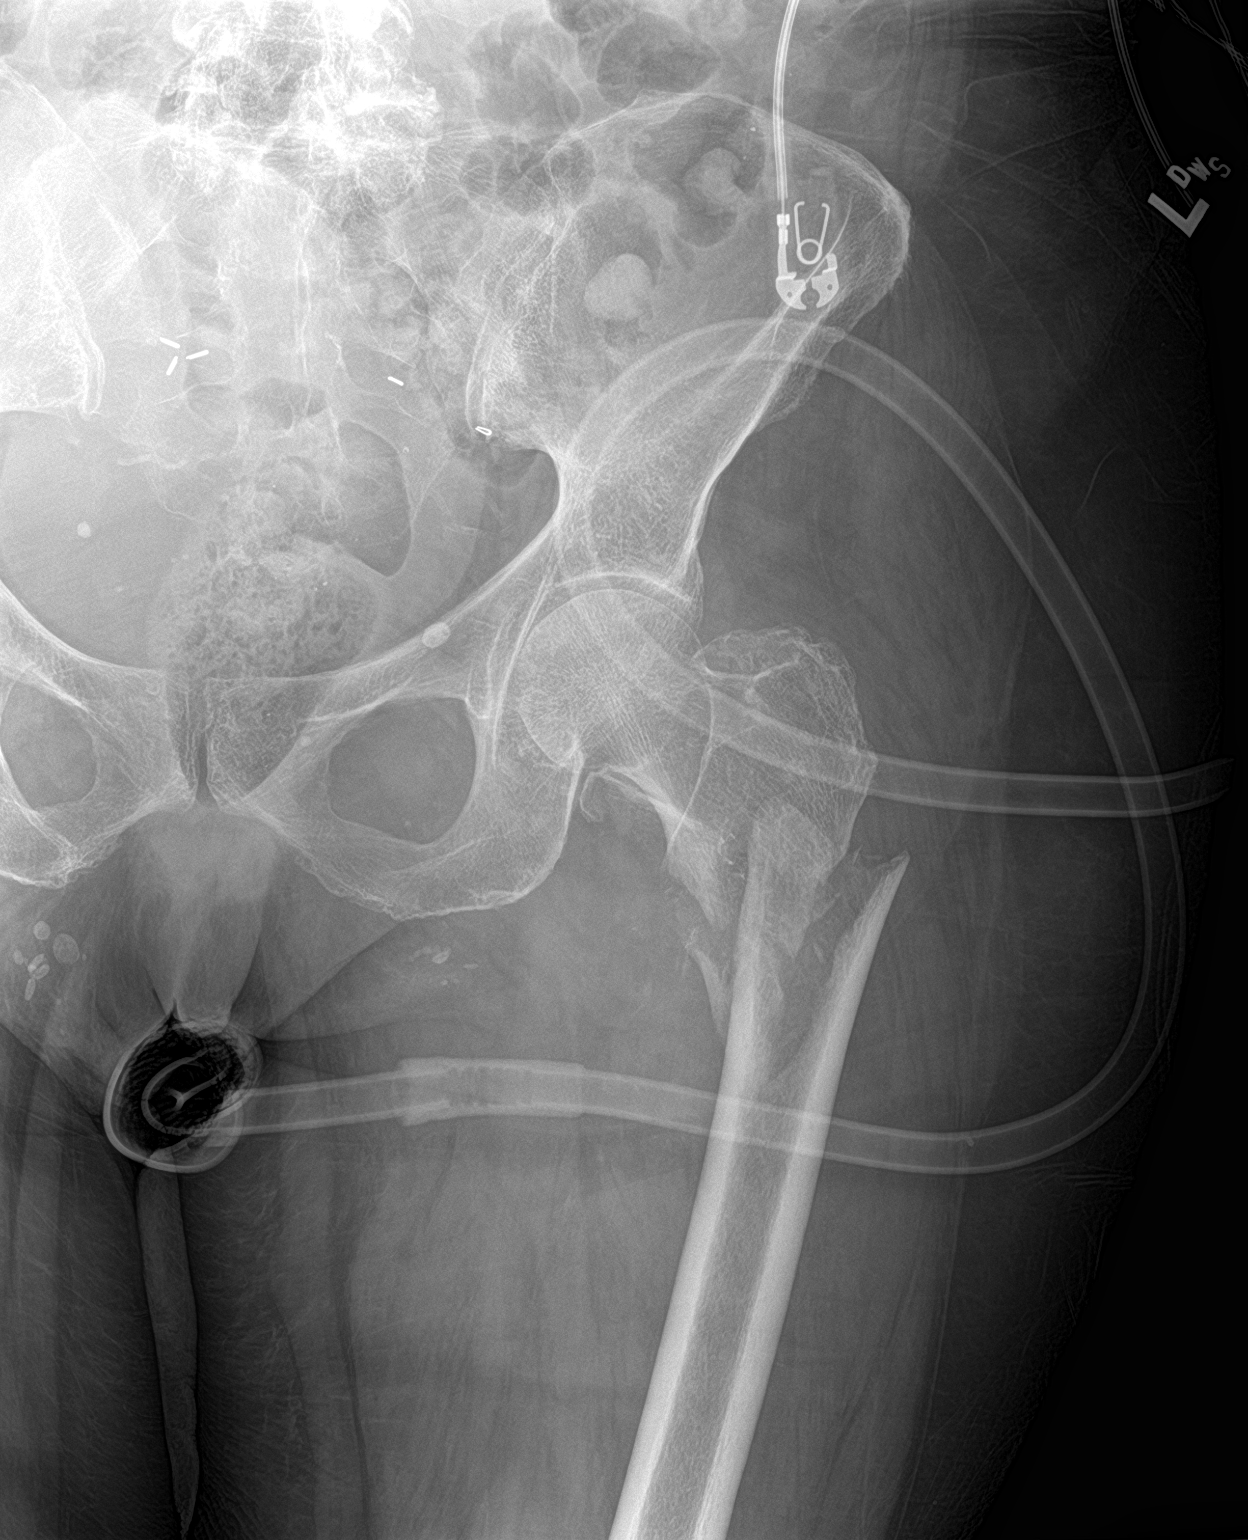

[hip lat]
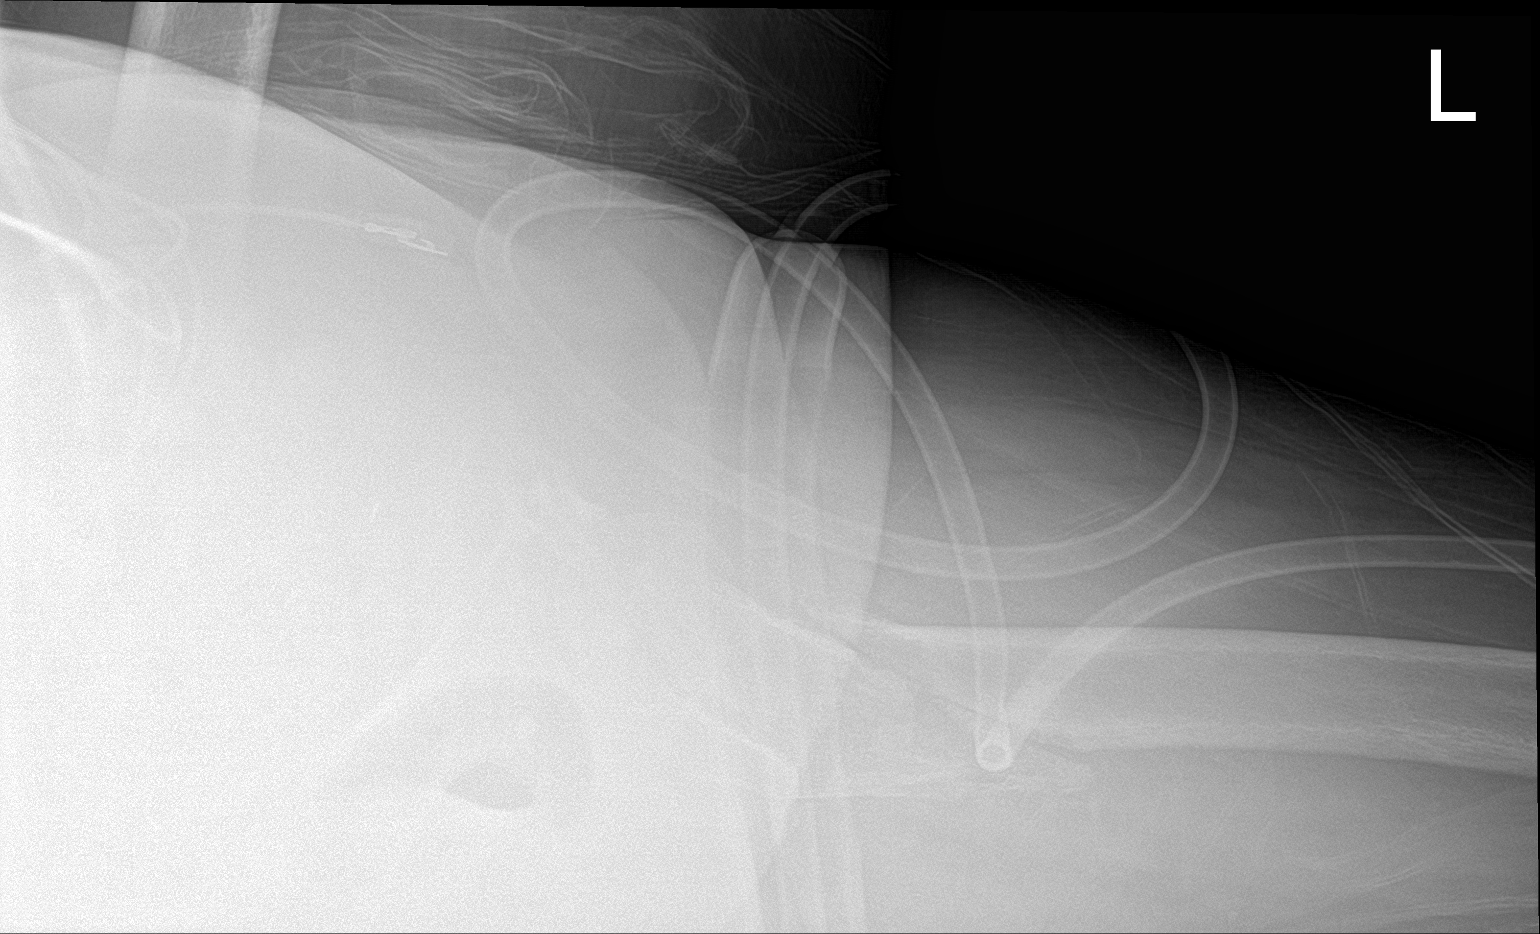

[3 of 3 positions shown; findings below may reference images not displayed]

FINDINGS: SI joints are non widened. Clips in the pelvis. Pubic symphysis and
rami are intact. Both femoral heads are normally positioned. Acute
comminuted left intertrochanteric fracture with involvement of the
subtrochanteric shaft of the femur. About [DATE] shaft diameter
anterior and [DATE] shaft diameter lateral displacement of distal
fracture fragment. Mild varus angulation.
IMPRESSION: Acute comminuted, displaced and angulated proximal femoral fracture
appears to involve the trochanteric region and subtrochanteric
proximal shaft of the femur

## 2022-09-12 IMAGING — XA DG FEMUR 2+V*L*
1 series · 4 of 4 positions shown · non-contrast
Comparison: Hip radiograph yesterday

CLINICAL DATA: Left femoral nail.

EXAM:
LEFT FEMUR 2 VIEWS

[Series 1: unknown protocol · 0.20mm/px · 4 of 4 slices shown]
[im 1/4]
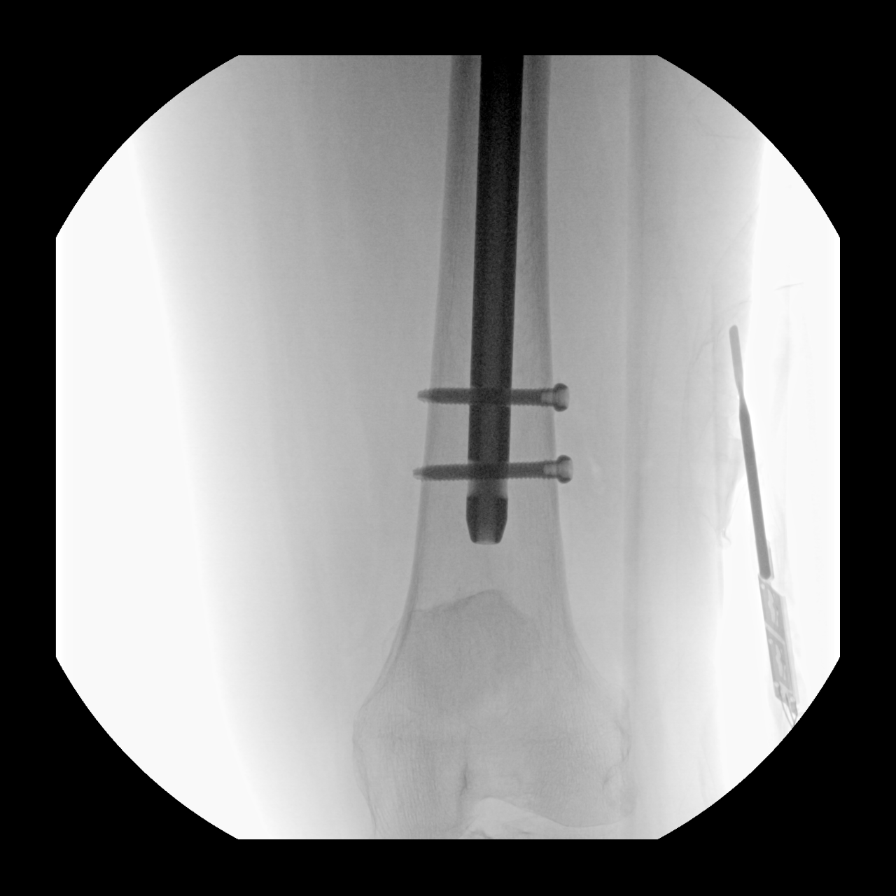
[im 2/4]
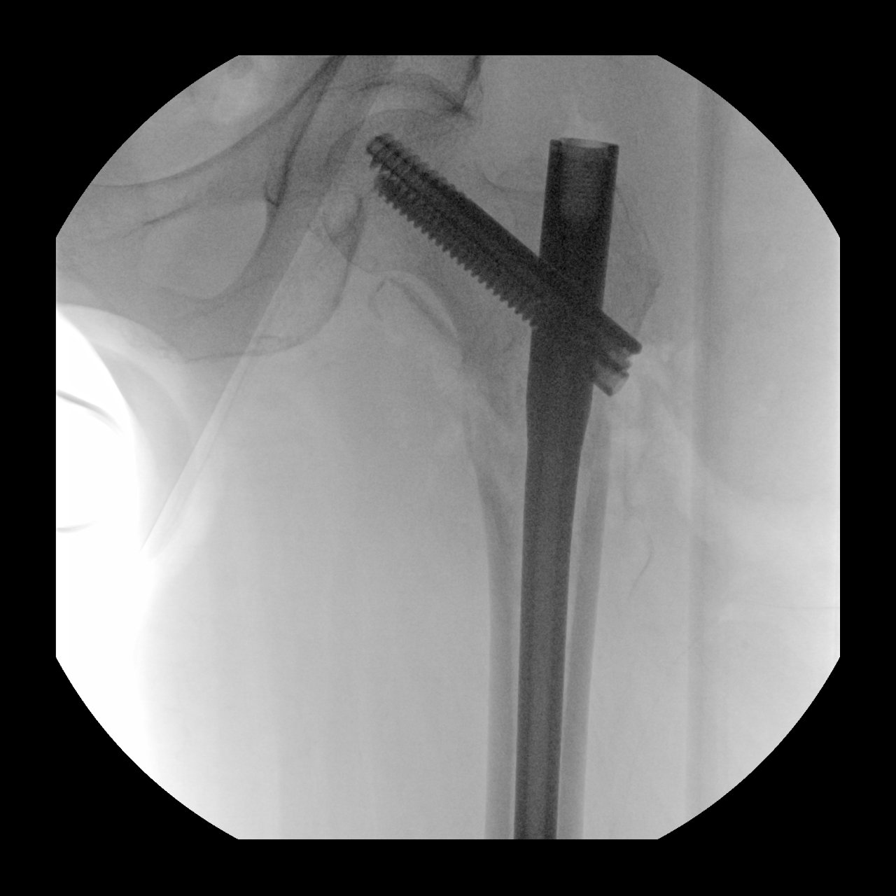
[im 3/4]
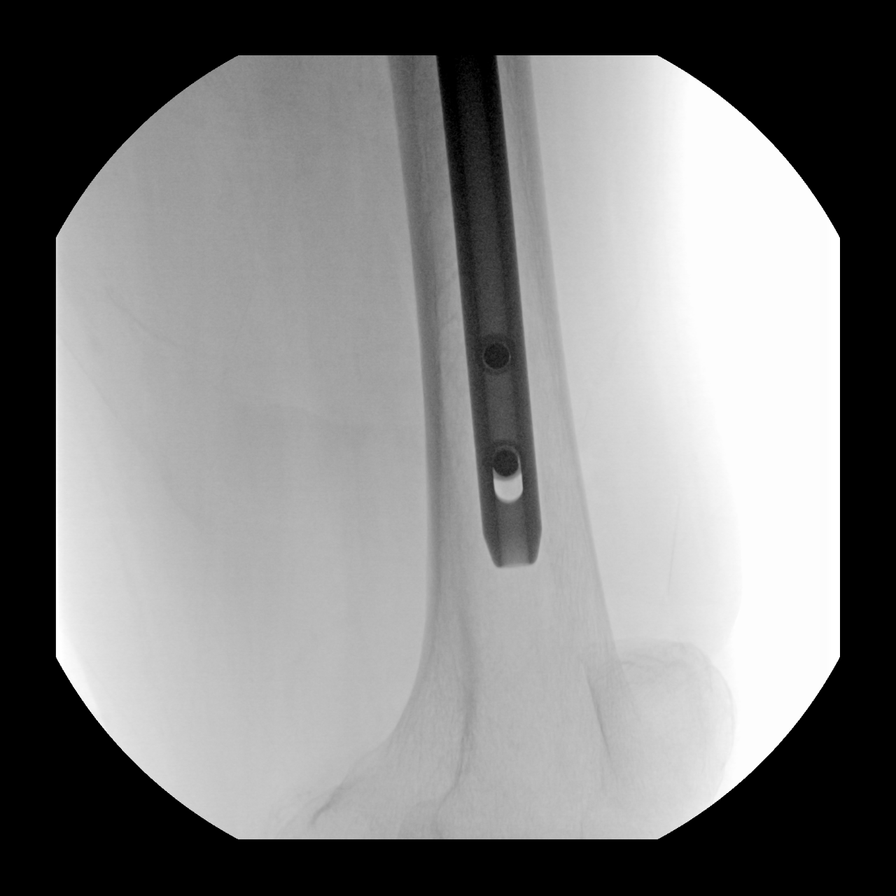
[im 4/4]
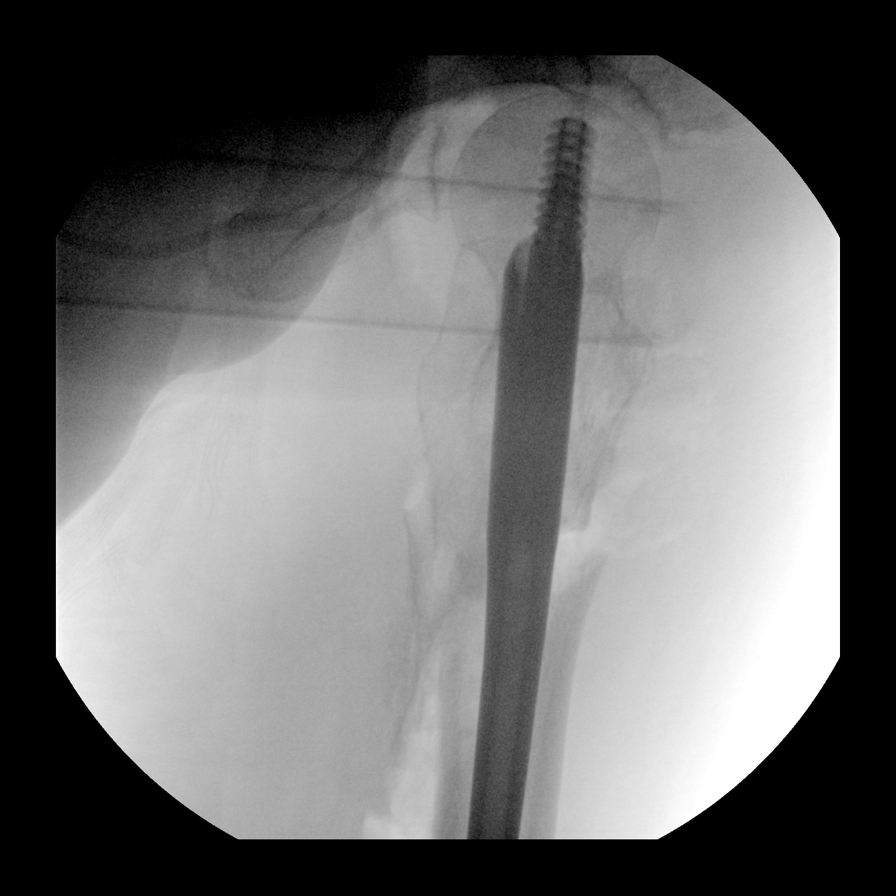

[4 of 4 positions shown; findings below may reference images not displayed]

FINDINGS: Four fluoroscopic spot views of the left femur obtained in the
operating room. Femoral intramedullary nail with trans trochanteric
and distal locking screw fixation. Fluoroscopy time 2 minutes 12
seconds. Dose 31.4 MGy
IMPRESSION: Intraoperative fluoroscopy during left femoral intramedullary nail
placement.

## 2022-09-15 DIAGNOSIS — R69 Illness, unspecified: Secondary | ICD-10-CM | POA: Diagnosis not present

## 2022-09-15 DIAGNOSIS — Z974 Presence of external hearing-aid: Secondary | ICD-10-CM | POA: Diagnosis not present

## 2022-09-15 DIAGNOSIS — G4733 Obstructive sleep apnea (adult) (pediatric): Secondary | ICD-10-CM | POA: Diagnosis not present

## 2022-09-15 DIAGNOSIS — K219 Gastro-esophageal reflux disease without esophagitis: Secondary | ICD-10-CM | POA: Diagnosis not present

## 2022-09-15 DIAGNOSIS — J849 Interstitial pulmonary disease, unspecified: Secondary | ICD-10-CM | POA: Diagnosis not present

## 2022-09-15 DIAGNOSIS — K5900 Constipation, unspecified: Secondary | ICD-10-CM | POA: Diagnosis not present

## 2022-09-15 DIAGNOSIS — E785 Hyperlipidemia, unspecified: Secondary | ICD-10-CM | POA: Diagnosis not present

## 2022-09-15 DIAGNOSIS — Z7901 Long term (current) use of anticoagulants: Secondary | ICD-10-CM | POA: Diagnosis not present

## 2022-09-15 DIAGNOSIS — Z9181 History of falling: Secondary | ICD-10-CM | POA: Diagnosis not present

## 2022-09-15 DIAGNOSIS — D649 Anemia, unspecified: Secondary | ICD-10-CM | POA: Diagnosis not present

## 2022-09-15 DIAGNOSIS — M810 Age-related osteoporosis without current pathological fracture: Secondary | ICD-10-CM | POA: Diagnosis not present

## 2022-09-15 DIAGNOSIS — E871 Hypo-osmolality and hyponatremia: Secondary | ICD-10-CM | POA: Diagnosis not present

## 2022-09-15 DIAGNOSIS — I10 Essential (primary) hypertension: Secondary | ICD-10-CM | POA: Diagnosis not present

## 2022-09-15 DIAGNOSIS — H401130 Primary open-angle glaucoma, bilateral, stage unspecified: Secondary | ICD-10-CM | POA: Diagnosis not present

## 2022-09-15 DIAGNOSIS — Z791 Long term (current) use of non-steroidal anti-inflammatories (NSAID): Secondary | ICD-10-CM | POA: Diagnosis not present

## 2022-09-15 DIAGNOSIS — M869 Osteomyelitis, unspecified: Secondary | ICD-10-CM | POA: Diagnosis not present

## 2022-09-15 DIAGNOSIS — Z993 Dependence on wheelchair: Secondary | ICD-10-CM | POA: Diagnosis not present

## 2022-09-15 DIAGNOSIS — T84195D Other mechanical complication of internal fixation device of left femur, subsequent encounter: Secondary | ICD-10-CM | POA: Diagnosis not present

## 2022-10-02 ENCOUNTER — Other Ambulatory Visit: Payer: Self-pay | Admitting: Infectious Diseases

## 2022-10-12 DIAGNOSIS — R2681 Unsteadiness on feet: Secondary | ICD-10-CM | POA: Diagnosis not present

## 2022-10-12 DIAGNOSIS — Z008 Encounter for other general examination: Secondary | ICD-10-CM | POA: Diagnosis not present

## 2022-10-12 DIAGNOSIS — H409 Unspecified glaucoma: Secondary | ICD-10-CM | POA: Diagnosis not present

## 2022-10-12 DIAGNOSIS — E785 Hyperlipidemia, unspecified: Secondary | ICD-10-CM | POA: Diagnosis not present

## 2022-10-12 DIAGNOSIS — M1990 Unspecified osteoarthritis, unspecified site: Secondary | ICD-10-CM | POA: Diagnosis not present

## 2022-10-12 DIAGNOSIS — Z85038 Personal history of other malignant neoplasm of large intestine: Secondary | ICD-10-CM | POA: Diagnosis not present

## 2022-10-12 DIAGNOSIS — H9193 Unspecified hearing loss, bilateral: Secondary | ICD-10-CM | POA: Diagnosis not present

## 2022-10-12 DIAGNOSIS — E669 Obesity, unspecified: Secondary | ICD-10-CM | POA: Diagnosis not present

## 2022-10-12 DIAGNOSIS — R69 Illness, unspecified: Secondary | ICD-10-CM | POA: Diagnosis not present

## 2022-10-12 DIAGNOSIS — I10 Essential (primary) hypertension: Secondary | ICD-10-CM | POA: Diagnosis not present

## 2022-10-12 DIAGNOSIS — K219 Gastro-esophageal reflux disease without esophagitis: Secondary | ICD-10-CM | POA: Diagnosis not present

## 2022-10-12 DIAGNOSIS — J301 Allergic rhinitis due to pollen: Secondary | ICD-10-CM | POA: Diagnosis not present

## 2022-10-12 DIAGNOSIS — Z6830 Body mass index (BMI) 30.0-30.9, adult: Secondary | ICD-10-CM | POA: Diagnosis not present

## 2022-10-19 DIAGNOSIS — M869 Osteomyelitis, unspecified: Secondary | ICD-10-CM | POA: Diagnosis not present

## 2022-10-19 DIAGNOSIS — S72142D Displaced intertrochanteric fracture of left femur, subsequent encounter for closed fracture with routine healing: Secondary | ICD-10-CM | POA: Diagnosis not present

## 2022-11-05 ENCOUNTER — Telehealth: Payer: Self-pay

## 2022-11-05 NOTE — Telephone Encounter (Signed)
Copied from [place] [id number]. Topic: General - Other >> Nov 05, 2022  3:36 PM Dominique A wrote: Reason for CRM: Pt states that she went to her ENT (she has hearing aids) and was told she has wax in her ear. The ENT is wanting to get a mold for her ear. Pt is wanting to know if she needs to see PCP before she gets the mold for her ear done and if her PCP need to refer her back to the ENT for the molds to get done. Please call pt back.

## 2022-11-08 NOTE — Telephone Encounter (Signed)
Please have patient schedule appointment with ENT if she was evaluated by them within the last year.  If it has been over a year, I will place new referral.   [name], MD  [facility]

## 2022-11-08 NOTE — Telephone Encounter (Signed)
Patient reports that she needs her ear wax remove to have her new hearing aid mold done.

## 2022-11-10 DIAGNOSIS — I10 Essential (primary) hypertension: Secondary | ICD-10-CM | POA: Diagnosis not present

## 2022-11-10 DIAGNOSIS — M6281 Muscle weakness (generalized): Secondary | ICD-10-CM | POA: Diagnosis not present

## 2022-11-11 ENCOUNTER — Encounter: Payer: Self-pay | Admitting: Physician Assistant

## 2022-11-11 ENCOUNTER — Ambulatory Visit (INDEPENDENT_AMBULATORY_CARE_PROVIDER_SITE_OTHER): Payer: Medicare HMO | Admitting: Physician Assistant

## 2022-11-11 VITALS — BP 95/82 | HR 102 | Wt 150.5 lb

## 2022-11-11 DIAGNOSIS — H6123 Impacted cerumen, bilateral: Secondary | ICD-10-CM | POA: Diagnosis not present

## 2022-11-11 NOTE — Progress Notes (Signed)
I,Sha'taria Tyson,acting as a Education administrator for [employer], PA-C.,have documented all relevant documentation on the behalf of Melanie Kirschner, PA-C,as directed by  Melanie Kirschner, PA-C while in the presence of Melanie Kirschner, PA-C.   Established patient visit   Patient: Melanie Carrillo   DOB: [birth year]   88 y.o. Female  MRN: [id number] Visit Date: 11/11/2022  Today's healthcare provider: [name], PA-C   Cc. Ear wax  Subjective    HPI  Patient is being seen due to ear wax being clogged in both ears. Patient would like ENT referral if we aren't able to irrigate successfully so that she can get her ears molded for her hearing aids.   Medications: Outpatient Medications Prior to Visit  Medication Sig   acetaminophen (TYLENOL) 325 MG tablet Take 1-2 tablets (325-650 mg total) by mouth every 6 (six) hours as needed for mild pain (pain score 1-3 or temp > 100.5). (Patient taking differently: Take 650-1,300 mg by mouth as needed (pain).)   brimonidine (ALPHAGAN) 0.2 % ophthalmic solution Place 1 drop into both eyes in the morning and at bedtime.   Cholecalciferol (VITAMIN D-3) 125 MCG (5000 UT) TABS Take 5,000 Units by mouth in the morning.   guaiFENesin (MUCINEX) 600 MG 12 hr tablet Take 600 mg by mouth in the morning and at bedtime.   latanoprost (XALATAN) 0.005 % ophthalmic solution Place 1 drop into both eyes at bedtime.   lovastatin (MEVACOR) 40 MG tablet Take 40 mg by mouth daily with supper.   Melatonin 10 MG TABS Take 40 mg by mouth at bedtime as needed (sleep).   montelukast (SINGULAIR) 10 MG tablet Take 10 mg by mouth at bedtime.   Multiple Vitamins-Minerals (MULTIVITAMIN WITH MINERALS) tablet Take 1 tablet by mouth in the morning.   omeprazole (PRILOSEC) 40 MG capsule Take 40 mg by mouth daily before breakfast.   sertraline (ZOLOFT) 50 MG tablet Take 1 tablet (50 mg total) by mouth daily.   traZODone (DESYREL) 50 MG tablet Take 0.5-1 tablets (25-50 mg total) by mouth at  bedtime as needed.   bisacodyl (DULCOLAX) 5 MG EC tablet Take 5 mg by mouth in the morning.   feeding supplement (ENSURE ENLIVE / ENSURE PLUS) LIQD Take 237 mLs by mouth 2 (two) times daily between meals. (Patient taking differently: Take 237 mLs by mouth daily as needed (nutrition supplement).)   meloxicam (MOBIC) 7.5 MG tablet Take 1 tablet (7.5 mg total) by mouth daily. (Patient not taking: Reported on 07/28/2022)   oxyCODONE (OXY IR/ROXICODONE) 5 MG immediate release tablet Take 1 tablet (5 mg total) by mouth every 6 (six) hours as needed for severe pain (not controlled with Tylenol).   pantoprazole (PROTONIX) 40 MG tablet Take 1 tablet (40 mg total) by mouth daily before breakfast.   No facility-administered medications prior to visit.    Review of Systems  Constitutional:  Negative for fatigue and fever.  HENT:  Positive for hearing loss.   Respiratory:  Negative for cough and shortness of breath.   Cardiovascular:  Negative for chest pain and leg swelling.  Gastrointestinal:  Negative for abdominal pain.  Neurological:  Negative for dizziness and headaches.      Objective    BP 95/82 (BP Location: Left Arm, Patient Position: Sitting, Cuff Size: Normal)   Pulse (!) 102   Wt 150 lb 8 oz (68.3 kg)   LMP  (LMP Unknown)   SpO2 100%   BMI 27.53 kg/m  Blood pressure 95/82, pulse Marland Kitchen)  102, weight 150 lb 8 oz (68.3 kg), SpO2 100 %.   Physical Exam Vitals reviewed.  Constitutional:      Appearance: She is not ill-appearing.  HENT:     Head: Normocephalic.     Right Ear: There is impacted cerumen.     Left Ear: There is impacted cerumen.     Ears:     Comments: B/l clear after flushing Eyes:     Conjunctiva/sclera: Conjunctivae normal.  Cardiovascular:     Rate and Rhythm: Normal rate.  Pulmonary:     Effort: Pulmonary effort is normal. No respiratory distress.  Neurological:     General: No focal deficit present.     Mental Status: She is alert and oriented to person,  place, and time.  Psychiatric:        Mood and Affect: Mood normal.        Behavior: Behavior normal.        No results found for any visits on 11/11/22.  Assessment & Plan     Bilateral impacted cerumen Flushed by cma Clear after Small amount of bleeding Rt ear from thin skin in ear canal, irritated.   Return if symptoms worsen or fail to improve.      I, Melanie Kirschner, PA-C have reviewed all documentation for this visit. The documentation on  11/11/22  for the exam, diagnosis, procedures, and orders are all accurate and complete.  Melanie Kirschner, PA-C [facility] [address] [place], [place], [address] Office: [phone] Fax: [place]

## 2022-11-23 ENCOUNTER — Other Ambulatory Visit: Payer: Self-pay | Admitting: Infectious Diseases

## 2022-11-23 ENCOUNTER — Other Ambulatory Visit: Payer: Self-pay | Admitting: Family Medicine

## 2022-11-26 ENCOUNTER — Ambulatory Visit (INDEPENDENT_AMBULATORY_CARE_PROVIDER_SITE_OTHER): Payer: Medicare HMO | Admitting: Family Medicine

## 2022-11-26 ENCOUNTER — Encounter: Payer: Self-pay | Admitting: Family Medicine

## 2022-11-26 VITALS — BP 135/77 | HR 98 | Temp 97.7°F | Resp 16 | Wt 148.5 lb

## 2022-11-26 DIAGNOSIS — Z780 Asymptomatic menopausal state: Secondary | ICD-10-CM | POA: Insufficient documentation

## 2022-11-26 DIAGNOSIS — I10 Essential (primary) hypertension: Secondary | ICD-10-CM

## 2022-11-26 DIAGNOSIS — Z0000 Encounter for general adult medical examination without abnormal findings: Secondary | ICD-10-CM | POA: Insufficient documentation

## 2022-11-26 DIAGNOSIS — F5102 Adjustment insomnia: Secondary | ICD-10-CM

## 2022-11-26 MED ORDER — TRAZODONE HCL 50 MG PO TABS: 25.0000 mg | ORAL_TABLET | Freq: Every evening | ORAL | 2 refills | Status: DC | PRN

## 2022-11-26 NOTE — Assessment & Plan Note (Signed)
Annual wellness visit completed today including all of the following: -Reviewed patient's family medical history -Reviewed and updated patient's list of medical providers -Completed assessment of cognitive impairment -Completed assessment of patient's functional ability -Provided patient with recommendations for health screening services as well as vaccines Health risk assessment completed and reviewed -Discussed recommendations for well-balanced diet in addition to 30 minutes of physical activity 3-4 times per week as tolerable

## 2022-11-26 NOTE — Patient Instructions (Addendum)
It was a pleasure to see you today!  [facility].   Melanie LacyJanet M Rosenberger was seen for annual wellness visit.   Our plans for today were: We will collect labs today and follow up with you once results are available  I have ordered your bone density scan, please be on the lookout for a call to schedule this appointment   To keep you healthy, please keep in mind the following health maintenance items that you are due for:   Shingrix vaccine   Tetanus booster vaccine    You should return to our clinic in  [facility]ishes,   Dr. Roxan Hockeyobinson    Health Maintenance for Postmenopausal Women Menopause is a normal process in which your ability to get pregnant comes to an end. This process happens slowly over many months or years, usually between the ages of 5848 and 3755. Menopause is complete when you have missed your menstrual period for 12 months. It is important to talk with your health care provider about some of the most common conditions that affect women after menopause (postmenopausal women). These include heart disease, cancer, and bone loss (osteoporosis). Adopting a healthy lifestyle and getting preventive care can help to promote your health and wellness. The actions you take can also lower your chances of developing some of these common conditions. What are the signs and symptoms of menopause? During menopause, you may have the following symptoms: Hot flashes. These can be moderate or severe. Night sweats. Decrease in sex drive. Mood swings. Headaches. Tiredness (fatigue). Irritability. Memory problems. Problems falling asleep or staying asleep. Talk with your health care provider about treatment options for your symptoms. Do I need hormone replacement therapy? Hormone replacement therapy is effective in treating symptoms that are caused by menopause, such as hot flashes and night sweats. Hormone replacement carries certain risks,  especially as you become older. If you are thinking about using estrogen or estrogen with progestin, discuss the benefits and risks with your health care provider. How can I reduce my risk for heart disease and stroke? The risk of heart disease, heart attack, and stroke increases as you age. One of the causes may be a change in the body's hormones during menopause. This can affect how your body uses dietary fats, triglycerides, and cholesterol. Heart attack and stroke are medical emergencies. There are many things that you can do to help prevent heart disease and stroke. Watch your blood pressure High blood pressure causes heart disease and increases the risk of stroke. This is more likely to develop in people who have high blood pressure readings or are overweight. Have your blood pressure checked: Every 3-5 years if you are 5718-86 years of age. Every year if you are 86 years old or older. Eat a healthy diet  Eat a diet that includes plenty of vegetables, fruits, low-fat dairy products, and lean protein. Do not eat a lot of foods that are high in solid fats, added sugars, or sodium. Get regular exercise Get regular exercise. This is one of the most important things you can do for your health. Most adults should: Try to exercise for at least 150 minutes each week. The exercise should increase your heart rate and make you sweat (moderate-intensity exercise). Try to do strengthening exercises at least twice each week. Do these in addition to the moderate-intensity exercise. Spend less time sitting. Even light physical activity can be beneficial. Other tips Work with your health care  provider to achieve or maintain a healthy weight. Do not use any products that contain nicotine or tobacco. These products include cigarettes, chewing tobacco, and vaping devices, such as e-cigarettes. If you need help quitting, ask your health care provider. Know your numbers. Ask your health care provider to check your  cholesterol and your blood sugar (glucose). Continue to have your blood tested as directed by your health care provider. Do I need screening for cancer? Depending on your health history and family history, you may need to have cancer screenings at different stages of your life. This may include screening for: Breast cancer. Cervical cancer. Lung cancer. Colorectal cancer. What is my risk for osteoporosis? After menopause, you may be at increased risk for osteoporosis. Osteoporosis is a condition in which bone destruction happens more quickly than new bone creation. To help prevent osteoporosis or the bone fractures that can happen because of osteoporosis, you may take the following actions: If you are 105-73 years old, get at least 1,000 mg of calcium and at least 600 international units (IU) of vitamin D per day. If you are older than age 72 but younger than age 69, get at least 1,200 mg of calcium and at least 600 international units (IU) of vitamin D per day. If you are older than age 23, get at least 1,200 mg of calcium and at least 800 international units (IU) of vitamin D per day. Smoking and drinking excessive alcohol increase the risk of osteoporosis. Eat foods that are rich in calcium and vitamin D, and do weight-bearing exercises several times each week as directed by your health care provider. How does menopause affect my mental health? Depression may occur at any age, but it is more common as you become older. Common symptoms of depression include: Feeling depressed. Changes in sleep patterns. Changes in appetite or eating patterns. Feeling an overall lack of motivation or enjoyment of activities that you previously enjoyed. Frequent crying spells. Talk with your health care provider if you think that you are experiencing any of these symptoms. General instructions See your health care provider for regular wellness exams and vaccines. This may include: Scheduling regular health,  dental, and eye exams. Getting and maintaining your vaccines. These include: Influenza vaccine. Get this vaccine each year before the flu season begins. Pneumonia vaccine. Shingles vaccine. Tetanus, diphtheria, and pertussis (Tdap) booster vaccine. Your health care provider may also recommend other immunizations. Tell your health care provider if you have ever been abused or do not feel safe at home. Summary Menopause is a normal process in which your ability to get pregnant comes to an end. This condition causes hot flashes, night sweats, decreased interest in sex, mood swings, headaches, or lack of sleep. Treatment for this condition may include hormone replacement therapy. Take actions to keep yourself healthy, including exercising regularly, eating a healthy diet, watching your weight, and checking your blood pressure and blood sugar levels. Get screened for cancer and depression. Make sure that you are up to date with all your vaccines. This information is not intended to replace advice given to you by your health care provider. Make sure you discuss any questions you have with your health care provider. Document Revised: 12/29/2020 Document Reviewed: 12/29/2020 Elsevier Patient Education  2023 [employer].

## 2022-11-26 NOTE — Assessment & Plan Note (Signed)
Controlled BP at goal Patient currently maintains blood pressure with lifestyle modifications No new meds added today CMP collected today

## 2022-11-26 NOTE — Progress Notes (Signed)
Annual Wellness Visit     Patient: Melanie Carrillo, Female    DOB: 11-19-1934, 88 y.o.   MRN: [id number] Visit Date: 11/26/2022  Today's Provider: Ronnald Ramp, MD   Chief Complaint  Patient presents with   Annual Exam   Subjective    Melanie Carrillo is a 88 y.o. female who presents today for her Annual Wellness Visit.  HPI   Medications: Outpatient Medications Prior to Visit  Medication Sig   acetaminophen (TYLENOL) 325 MG tablet Take 1-2 tablets (325-650 mg total) by mouth every 6 (six) hours as needed for mild pain (pain score 1-3 or temp > 100.5). (Patient taking differently: Take 650-1,300 mg by mouth as needed (pain).)   brimonidine (ALPHAGAN) 0.2 % ophthalmic solution Place 1 drop into both eyes in the morning and at bedtime.   Cholecalciferol (VITAMIN D-3) 125 MCG (5000 UT) TABS Take 5,000 Units by mouth in the morning.   guaiFENesin (MUCINEX) 600 MG 12 hr tablet Take 600 mg by mouth in the morning and at bedtime.   latanoprost (XALATAN) 0.005 % ophthalmic solution Place 1 drop into both eyes at bedtime.   lovastatin (MEVACOR) 40 MG tablet Take 40 mg by mouth daily with supper.   Melatonin 10 MG TABS Take 40 mg by mouth at bedtime as needed (sleep).   montelukast (SINGULAIR) 10 MG tablet Take 10 mg by mouth at bedtime.   Multiple Vitamins-Minerals (MULTIVITAMIN WITH MINERALS) tablet Take 1 tablet by mouth in the morning.   omeprazole (PRILOSEC) 40 MG capsule Take 40 mg by mouth daily before breakfast.   sertraline (ZOLOFT) 50 MG tablet TAKE 1 TABLET BY MOUTH EVERY DAY   [DISCONTINUED] traZODone (DESYREL) 50 MG tablet Take 0.5-1 tablets (25-50 mg total) by mouth at bedtime as needed.   [DISCONTINUED] bisacodyl (DULCOLAX) 5 MG EC tablet Take 5 mg by mouth in the morning.   [DISCONTINUED] feeding supplement (ENSURE ENLIVE / ENSURE PLUS) LIQD Take 237 mLs by mouth 2 (two) times daily between meals. (Patient taking differently: Take 237 mLs by mouth daily as  needed (nutrition supplement).)   [DISCONTINUED] meloxicam (MOBIC) 7.5 MG tablet Take 1 tablet (7.5 mg total) by mouth daily. (Patient not taking: Reported on 07/28/2022)   [DISCONTINUED] oxyCODONE (OXY IR/ROXICODONE) 5 MG immediate release tablet Take 1 tablet (5 mg total) by mouth every 6 (six) hours as needed for severe pain (not controlled with Tylenol).   [DISCONTINUED] pantoprazole (PROTONIX) 40 MG tablet Take 1 tablet (40 mg total) by mouth daily before breakfast.   No facility-administered medications prior to visit.    No Known Allergies  Patient Care Team: Ronnald Ramp, MD as PCP - General (Family Medicine)  Review of Systems  Constitutional:  Positive for fatigue.  HENT:  Positive for sneezing.   Eyes:  Positive for itching.  Respiratory:  Positive for shortness of breath.   Cardiovascular: Negative.   Gastrointestinal: Negative.   Endocrine: Negative.   Genitourinary: Negative.   Musculoskeletal:  Positive for arthralgias.  Skin: Negative.   Allergic/Immunologic: Negative.   Neurological: Negative.   Hematological: Negative.   Psychiatric/Behavioral:  Positive for sleep disturbance.         Objective    Vitals: BP 135/77 (BP Location: Right Arm, Patient Position: Sitting, Cuff Size: Normal)   Pulse 98   Temp 97.7 F (36.5 C) (Temporal)   Resp 16   Wt 148 lb 8 oz (67.4 kg)   LMP  (LMP Unknown)   SpO2 98%  BMI 27.16 kg/m     Physical Exam Vitals reviewed.  Constitutional:      General: She is not in acute distress.    Appearance: Normal appearance. She is not ill-appearing, toxic-appearing or diaphoretic.     Comments: Elderly female in no acute distress seated in chair with walker  HENT:     Head: Normocephalic and atraumatic.     Right Ear: Tympanic membrane and external ear normal. There is no impacted cerumen.     Left Ear: Tympanic membrane and external ear normal. There is no impacted cerumen.     Nose: Nose normal.      Mouth/Throat:     Pharynx: Oropharynx is clear.  Eyes:     General: No scleral icterus.    Extraocular Movements: Extraocular movements intact.     Conjunctiva/sclera: Conjunctivae normal.     Pupils: Pupils are equal, round, and reactive to light.  Cardiovascular:     Rate and Rhythm: Normal rate and regular rhythm.     Pulses: Normal pulses.     Heart sounds: Normal heart sounds. No murmur heard.    No friction rub. No gallop.  Pulmonary:     Effort: Pulmonary effort is normal. No respiratory distress.     Breath sounds: Normal breath sounds. No wheezing, rhonchi or rales.  Abdominal:     General: Bowel sounds are normal. There is no distension.     Palpations: Abdomen is soft. There is no mass.     Tenderness: There is no abdominal tenderness. There is no guarding.  Musculoskeletal:        General: No deformity.     Cervical back: Normal range of motion and neck supple. No rigidity.     Right lower leg: No edema.     Left lower leg: No edema.  Lymphadenopathy:     Cervical: No cervical adenopathy.  Skin:    General: Skin is warm.     Capillary Refill: Capillary refill takes less than 2 seconds.     Findings: No erythema or rash.  Neurological:     General: No focal deficit present.     Mental Status: She is alert and oriented to person, place, and time.     Motor: No weakness.     Gait: Gait normal.  Psychiatric:        Mood and Affect: Mood normal.        Behavior: Behavior normal.     Most recent functional status assessment:    11/26/2022    3:53 PM  In your present state of health, do you have any difficulty performing the following activities:  Hearing? 1  Vision? 0  Difficulty concentrating or making decisions? 0  Walking or climbing stairs? 1  Dressing or bathing? 0  Doing errands, shopping? 0   Most recent fall risk assessment:    11/26/2022    3:53 PM  Fall Risk   Falls in the past year? 1  Number falls in past yr: 0  Injury with Fall? 1  Risk for  fall due to : History of fall(s)  Follow up Falls evaluation completed;Education provided;Falls prevention discussed    Most recent depression screenings:    11/26/2022    3:53 PM 11/11/2022    4:11 PM  PHQ 2/9 Scores  PHQ - 2 Score 0 0  PHQ- 9 Score 4 0   Most recent cognitive screening:     No data to display  Most recent Audit-C alcohol use screening    11/26/2022    3:54 PM  Alcohol Use Disorder Test (AUDIT)  1. How often do you have a drink containing alcohol? 0  2. How many drinks containing alcohol do you have on a typical day when you are drinking? 0  3. How often do you have six or more drinks on one occasion? 0  AUDIT-C Score 0   No results found for any visits on 11/26/22.  Assessment & Plan      Immunization History  Administered Date(s) Administered   Covid-19, Mrna,Vaccine(Spikevax)9159yrs and older 06/17/2022   Fluad Quad(high Dose 65+) 05/27/2022   Influenza Split 04/18/2015   Influenza, High Dose Seasonal PF 07/17/2014, 05/12/2016, 05/25/2017, 04/20/2018, 06/09/2019, 05/09/2021   Influenza, Seasonal, Injecte, Preservative Fre 07/18/2006, 06/03/2008, 05/20/2010, 06/01/2013, 04/25/2020   Influenza-Unspecified 05/25/2012   Moderna Sars-Covid-2 Vaccination 10/05/2019, 11/02/2019, 04/25/2020, 03/11/2021   PNEUMOCOCCAL CONJUGATE-20 03/11/2021   Pneumococcal Conjugate-13 05/12/2016   Pneumococcal Polysaccharide-23 05/25/2017    Health Maintenance  Topic Date Due   DTaP/Tdap/Td (1 - Tdap) Never done   Zoster Vaccines- Shingrix (1 of 2) Never done   DEXA SCAN  Never done   COVID-19 Vaccine (6 - 2023-24 season) 08/12/2022   INFLUENZA VACCINE  03/24/2023   Medicare Annual Wellness (AWV)  11/26/2023   Pneumonia Vaccine 4865+ Years old  Completed   HPV VACCINES  Aged Out    Problem List Items Addressed This Visit       Cardiovascular and Mediastinum   Essential hypertension - Primary    Controlled BP at goal Patient currently maintains blood  pressure with lifestyle modifications No new meds added today CMP collected today        Relevant Orders   Comprehensive metabolic panel     Other   Adjustment insomnia    Patient continues to have insomnia We discussed the importance of cool and quiet environment for sleep Refills with trazodone 50 mg at bedtime sent to patient's pharmacy today      Encounter for annual wellness exam in Medicare patient    Annual wellness visit completed today including all of the following: -Reviewed patient's family medical history -Reviewed and updated patient's list of medical providers -Completed assessment of cognitive impairment -Completed assessment of patient's functional ability -Provided patient with recommendations for health screening services as well as vaccines Health risk assessment completed and reviewed -Discussed recommendations for well-balanced diet in addition to 30 minutes of physical activity 3-4 times per week as tolerable       Postmenopausal estrogen deficiency    Bone density scan ordered      Relevant Orders   DG Bone Density     Return in about 6 months (around 05/28/2023) for htn, .      The entirety of the information documented in the History of Present Illness, Review of Systems and Physical Exam were personally obtained by me. Portions of this information were initially documented by [facility] Dimas . I, Ronnald RampMakiera [name], MD have reviewed the documentation above for thoroughness and accuracy.      Ronnald RampMakiera [name], MD  [facility] [phone] (phone) [phone] (fax)  [facility]

## 2022-11-26 NOTE — Assessment & Plan Note (Signed)
Patient continues to have insomnia We discussed the importance of cool and quiet environment for sleep Refills with trazodone 50 mg at bedtime sent to patient's pharmacy today

## 2022-11-26 NOTE — Assessment & Plan Note (Signed)
Bone density scan ordered.

## 2022-12-01 LAB — COMPREHENSIVE METABOLIC PANEL
ALT: 14 IU/L (ref 0–32)
AST: 25 IU/L (ref 0–40)
Albumin/Globulin Ratio: 1.4 (ref 1.2–2.2)
Albumin: 4.3 g/dL (ref 3.7–4.7)
Alkaline Phosphatase: 87 IU/L (ref 44–121)
BUN/Creatinine Ratio: 19 (ref 12–28)
BUN: 11 mg/dL (ref 8–27)
Bilirubin Total: 0.4 mg/dL (ref 0.0–1.2)
CO2: 21 mmol/L (ref 20–29)
Calcium: 10.4 mg/dL — ABNORMAL HIGH (ref 8.7–10.3)
Chloride: 96 mmol/L (ref 96–106)
Creatinine, Ser: 0.59 mg/dL (ref 0.57–1.00)
Globulin, Total: 3 g/dL (ref 1.5–4.5)
Glucose: 112 mg/dL — ABNORMAL HIGH (ref 70–99)
Potassium: 4.6 mmol/L (ref 3.5–5.2)
Sodium: 134 mmol/L (ref 134–144)
Total Protein: 7.3 g/dL (ref 6.0–8.5)
eGFR: 88 mL/min/{1.73_m2} (ref >59)

## 2022-12-07 DIAGNOSIS — S72142D Displaced intertrochanteric fracture of left femur, subsequent encounter for closed fracture with routine healing: Secondary | ICD-10-CM | POA: Diagnosis not present

## 2022-12-07 DIAGNOSIS — M869 Osteomyelitis, unspecified: Secondary | ICD-10-CM | POA: Diagnosis not present

## 2022-12-11 DIAGNOSIS — M6281 Muscle weakness (generalized): Secondary | ICD-10-CM | POA: Diagnosis not present

## 2022-12-11 DIAGNOSIS — I10 Essential (primary) hypertension: Secondary | ICD-10-CM | POA: Diagnosis not present

## 2022-12-30 ENCOUNTER — Other Ambulatory Visit: Payer: Self-pay | Admitting: Family Medicine

## 2022-12-30 NOTE — Telephone Encounter (Signed)
Last RF 11/26/22 #60 2 RF  Requested Prescriptions  Refused Prescriptions Disp Refills   traZODone (DESYREL) 50 MG tablet [Pharmacy Med Name: TRAZODONE 50 MG TABLET] 30 tablet 1    Sig: TAKE 0.5- 1 TAB (25-50MG ) BY MOUTH AT BEDTIME AS NEEDED     Psychiatry: Antidepressants - Serotonin Modulator Passed - 12/30/2022  2:24 AM      Passed - Completed PHQ-2 or PHQ-9 in the last 360 days      Passed - Valid encounter within last 6 months    Recent Outpatient Visits           1 month ago Essential hypertension   [facility] [place], Sebastian, MD   1 month ago Bilateral impacted cerumen   [facility] Alfredia Ferguson, PA-C   5 months ago Other fatigue   [facility] [place], Tawanna Cooler, MD

## 2023-01-10 DIAGNOSIS — I10 Essential (primary) hypertension: Secondary | ICD-10-CM | POA: Diagnosis not present

## 2023-01-10 DIAGNOSIS — M6281 Muscle weakness (generalized): Secondary | ICD-10-CM | POA: Diagnosis not present

## 2023-01-11 DIAGNOSIS — H40153 Residual stage of open-angle glaucoma, bilateral: Secondary | ICD-10-CM | POA: Diagnosis not present

## 2023-01-19 DIAGNOSIS — M810 Age-related osteoporosis without current pathological fracture: Secondary | ICD-10-CM | POA: Diagnosis not present

## 2023-02-10 DIAGNOSIS — M6281 Muscle weakness (generalized): Secondary | ICD-10-CM | POA: Diagnosis not present

## 2023-02-10 DIAGNOSIS — I10 Essential (primary) hypertension: Secondary | ICD-10-CM | POA: Diagnosis not present

## 2023-02-27 ENCOUNTER — Other Ambulatory Visit: Payer: Self-pay | Admitting: Family Medicine

## 2023-02-28 ENCOUNTER — Other Ambulatory Visit: Payer: Self-pay

## 2023-02-28 ENCOUNTER — Ambulatory Visit (INDEPENDENT_AMBULATORY_CARE_PROVIDER_SITE_OTHER): Payer: Medicare HMO | Admitting: Family Medicine

## 2023-02-28 ENCOUNTER — Encounter: Payer: Self-pay | Admitting: Family Medicine

## 2023-02-28 VITALS — BP 87/66 | HR 94 | Resp 14 | Ht 62.0 in | Wt 147.0 lb

## 2023-02-28 DIAGNOSIS — F5102 Adjustment insomnia: Secondary | ICD-10-CM

## 2023-02-28 DIAGNOSIS — R1030 Lower abdominal pain, unspecified: Secondary | ICD-10-CM | POA: Diagnosis not present

## 2023-02-28 LAB — POCT URINALYSIS DIPSTICK
Blood, UA: NEGATIVE
Glucose, UA: NEGATIVE
Ketones, UA: NEGATIVE
Leukocytes, UA: NEGATIVE
Nitrite, UA: NEGATIVE
Protein, UA: POSITIVE — AB
Spec Grav, UA: 1.025 (ref 1.010–1.025)
Urobilinogen, UA: 0.2 E.U./dL
pH, UA: 6 (ref 5.0–8.0)

## 2023-02-28 MED ORDER — TRAZODONE HCL 50 MG PO TABS
25.0000 mg | ORAL_TABLET | Freq: Every evening | ORAL | 3 refills | Status: DC | PRN
Start: 2023-02-28 — End: 2023-10-17

## 2023-02-28 NOTE — Assessment & Plan Note (Signed)
Chronic Stable,improved on medication, patient requesting refill  -Approved Trazodone refill request from [facility].

## 2023-02-28 NOTE — Progress Notes (Signed)
I,Vanessa  Vital,acting as a Neurosurgeon for [employer], MD.,have documented all relevant documentation on the behalf of Ronnald Ramp, MD,as directed by  Ronnald Ramp, MD while in the presence of Ronnald Ramp, MD.    Established patient visit   Patient: Melanie Carrillo   DOB: 25-Apr-1935   88 y.o. Female  MRN: [id number] Visit Date: 02/28/2023  Today's healthcare provider: Ronnald Ramp, MD   Chief Complaint  Patient presents with   Abdominal Pain   Subjective    HPI    Discussed the use of AI scribe software for clinical note transcription with the patient, who gave verbal consent to proceed.  History of Present Illness   The patient presents with a chief complaint of abdominal pain, described as a burning sensation that wakes her up around 2:30-3:00 AM. The pain is circumferential, extending around to the back, and is temporarily relieved by changing positions and taking Tylenol. The patient also reports episodes of diarrhea and abdominal discomfort leading to a fear of eating certain foods. She has lost significant weight, dropping from 165 to 147 pounds.  The patient has a history of colon cancer in her early 60s, which was managed with a resection and did not require chemotherapy or radiation. She has not experienced any nausea or vomiting with the current abdominal pain. The patient reports daily bowel movements, though not consistently, and describes her stool as grainy. There is no reported hematochezia or melena.  The patient also reports a sensation of gas that she is unable to relieve, regardless of position. She has tried various over-the-counter medications, including Gas-X and Pepto-Bismol, with limited relief. The patient denies any urinary symptoms, such as dysuria or hematuria.  The patient's abdominal pain is located under the breasts and extends around to the back. She has not experienced any chest pain or  discomfort. The patient has a history of a surgical procedure, evident from scars on the abdomen. She has expressed a desire for comfort care and does not wish to undergo any further surgical interventions if the colon cancer were to recur.       Medications: Outpatient Medications Prior to Visit  Medication Sig   acetaminophen (TYLENOL) 325 MG tablet Take 1-2 tablets (325-650 mg total) by mouth every 6 (six) hours as needed for mild pain (pain score 1-3 or temp > 100.5). (Patient taking differently: Take 650-1,300 mg by mouth as needed (pain).)   brimonidine (ALPHAGAN) 0.2 % ophthalmic solution Place 1 drop into both eyes in the morning and at bedtime.   Cholecalciferol (VITAMIN D-3) 125 MCG (5000 UT) TABS Take 5,000 Units by mouth in the morning.   guaiFENesin (MUCINEX) 600 MG 12 hr tablet Take 600 mg by mouth in the morning and at bedtime.   latanoprost (XALATAN) 0.005 % ophthalmic solution Place 1 drop into both eyes at bedtime.   lovastatin (MEVACOR) 40 MG tablet Take 40 mg by mouth daily with supper.   Melatonin 10 MG TABS Take 40 mg by mouth at bedtime as needed (sleep).   montelukast (SINGULAIR) 10 MG tablet Take 10 mg by mouth at bedtime.   Multiple Vitamins-Minerals (MULTIVITAMIN WITH MINERALS) tablet Take 1 tablet by mouth in the morning.   omeprazole (PRILOSEC) 40 MG capsule Take 40 mg by mouth daily before breakfast.   sertraline (ZOLOFT) 50 MG tablet TAKE 1 TABLET BY MOUTH EVERY DAY   [DISCONTINUED] traZODone (DESYREL) 50 MG tablet Take 0.5-1 tablets (25-50 mg total) by mouth at bedtime as needed.  No facility-administered medications prior to visit.    Review of Systems     Objective    BP (!) 87/66 (BP Location: Left Arm, Patient Position: Sitting, Cuff Size: Normal)   Pulse 94   Resp 14   Ht 5\' 2"  (1.575 m)   Wt 147 lb (66.7 kg)   LMP  (LMP Unknown)   SpO2 96%   BMI 26.89 kg/m    Physical Exam  Physical Exam   MEASUREMENTS: WT- 147 CARDIOVASCULAR: Heart  rate normal, rhythm normal. ABDOMEN: Soft, non-tender in all four quadrants, no fluid wave, surgical scars present.       Results for orders placed or performed in visit on 02/28/23  POCT Urinalysis Dipstick  Result Value Ref Range   Color, UA dark yellow    Clarity, UA cloudy    Glucose, UA Negative Negative   Bilirubin, UA Trace    Ketones, UA Negative    Spec Grav, UA 1.025 1.010 - 1.025   Blood, UA Negative    pH, UA 6.0 5.0 - 8.0   Protein, UA Positive (A) Negative   Urobilinogen, UA 0.2 0.2 or 1.0 E.U./dL   Nitrite, UA Negative    Leukocytes, UA Negative Negative   Appearance Cloudy    Odor      Assessment & Plan     Problem List Items Addressed This Visit     Adjustment insomnia    Chronic Stable,improved on medication, patient requesting refill  -Approved Trazodone [facility]ill request from [facility].       Relevant Medications   traZODone (DESYREL) 50 MG tablet   Lower abdominal pain - Primary     New onset, nocturnal, burning abdominal pain with associated weight loss. History of colon cancer resection 25 years ago. No nausea, vomiting, or changes in bowel habits. No hematochezia or melena. -Order complete blood count, comprehensive metabolic panel, lipase, and urinalysis to evaluate for possible causes including pancreatitis, gallbladder disease, and urinary tract infection. -Consider CT imaging based on lab results.      Relevant Orders   CBC   Comprehensive metabolic panel   Lipase   POCT Urinalysis Dipstick (Completed)   Urine Culture      Return in about 3 weeks (around 03/21/2023) for abd pain f/u .       The entirety of the information documented in the History of Present Illness, Review of Systems and Physical Exam were personally obtained by me. Portions of this information were initially documented by Lubertha Basque, CMA . I, Ronnald Ramp, MD have reviewed the documentation above for thoroughness and accuracy.   Ronnald Ramp, MD   [facility] [phone] (phone) [phone] (fax)  [facility]

## 2023-02-28 NOTE — Assessment & Plan Note (Signed)
New onset, nocturnal, burning abdominal pain with associated weight loss. History of colon cancer resection 25 years ago. No nausea, vomiting, or changes in bowel habits. No hematochezia or melena. -Order complete blood count, comprehensive metabolic panel, lipase, and urinalysis to evaluate for possible causes including pancreatitis, gallbladder disease, and urinary tract infection. -Consider CT imaging based on lab results.

## 2023-02-28 NOTE — Telephone Encounter (Signed)
Requested Prescriptions  Refused Prescriptions Disp Refills   traZODone (DESYREL) 50 MG tablet [Pharmacy Med Name: TRAZODONE 50 MG TABLET] 60 tablet 2    Sig: TAKE 0.5-1 TABLETS (25-50 MG TOTAL) BY MOUTH AT BEDTIME AS NEEDED.     Psychiatry: Antidepressants - Serotonin Modulator Passed - 02/27/2023  8:41 AM      Passed - Completed PHQ-2 or PHQ-9 in the last 360 days      Passed - Valid encounter within last 6 months    Recent Outpatient Visits           3 months ago Essential hypertension   [facility] [place], Staples, MD   3 months ago Bilateral impacted cerumen   [facility] Alfredia Ferguson, PA-C   7 months ago Other fatigue    [facility] [name], Tawanna Cooler, MD       Future Appointments             Today [name], Tawanna Cooler, MD [facility] [place]

## 2023-03-01 ENCOUNTER — Other Ambulatory Visit: Payer: Self-pay | Admitting: Family Medicine

## 2023-03-01 ENCOUNTER — Encounter: Payer: Self-pay | Admitting: Family Medicine

## 2023-03-01 DIAGNOSIS — R1030 Lower abdominal pain, unspecified: Secondary | ICD-10-CM

## 2023-03-01 LAB — CBC
Hematocrit: 38 % (ref 34.0–46.6)
Hemoglobin: 12.7 g/dL (ref 11.1–15.9)
MCH: 34.9 pg — ABNORMAL HIGH (ref 26.6–33.0)
MCHC: 33.4 g/dL (ref 31.5–35.7)
MCV: 104 fL — ABNORMAL HIGH (ref 79–97)
Platelets: 285 10*3/uL (ref 150–450)
RBC: 3.64 x10E6/uL — ABNORMAL LOW (ref 3.77–5.28)
RDW: 13.2 % (ref 11.7–15.4)
WBC: 6.2 10*3/uL (ref 3.4–10.8)

## 2023-03-01 LAB — COMPREHENSIVE METABOLIC PANEL
ALT: 8 IU/L (ref 0–32)
AST: 18 IU/L (ref 0–40)
Albumin: 3.9 g/dL (ref 3.7–4.7)
Alkaline Phosphatase: 79 IU/L (ref 44–121)
BUN/Creatinine Ratio: 19 (ref 12–28)
BUN: 14 mg/dL (ref 8–27)
Bilirubin Total: 0.3 mg/dL (ref 0.0–1.2)
CO2: 21 mmol/L (ref 20–29)
Calcium: 10.1 mg/dL (ref 8.7–10.3)
Chloride: 104 mmol/L (ref 96–106)
Creatinine, Ser: 0.72 mg/dL (ref 0.57–1.00)
Globulin, Total: 3.4 g/dL (ref 1.5–4.5)
Glucose: 104 mg/dL — ABNORMAL HIGH (ref 70–99)
Potassium: 4.1 mmol/L (ref 3.5–5.2)
Sodium: 137 mmol/L (ref 134–144)
Total Protein: 7.3 g/dL (ref 6.0–8.5)
eGFR: 81 mL/min/{1.73_m2} (ref >59)

## 2023-03-01 LAB — LIPASE: Lipase: 62 U/L (ref 14–85)

## 2023-03-01 NOTE — Progress Notes (Signed)
Abd/pelvic CT with contrast ordered for diffuse lower abdominal pain

## 2023-03-04 ENCOUNTER — Other Ambulatory Visit: Payer: Self-pay | Admitting: Family Medicine

## 2023-03-04 DIAGNOSIS — A498 Other bacterial infections of unspecified site: Secondary | ICD-10-CM

## 2023-03-04 DIAGNOSIS — N3000 Acute cystitis without hematuria: Secondary | ICD-10-CM

## 2023-03-04 LAB — URINE CULTURE

## 2023-03-04 MED ORDER — NITROFURANTOIN MONOHYD MACRO 100 MG PO CAPS
100.0000 mg | ORAL_CAPSULE | Freq: Two times a day (BID) | ORAL | 0 refills | Status: AC
Start: 2023-03-04 — End: 2023-03-14

## 2023-03-04 NOTE — Progress Notes (Signed)
Prescribed macrobid 100mg  twice daily for 10 day course due to UTI found on culture.

## 2023-03-12 DIAGNOSIS — I10 Essential (primary) hypertension: Secondary | ICD-10-CM | POA: Diagnosis not present

## 2023-03-12 DIAGNOSIS — M6281 Muscle weakness (generalized): Secondary | ICD-10-CM | POA: Diagnosis not present

## 2023-03-16 ENCOUNTER — Ambulatory Visit
Admission: RE | Admit: 2023-03-16 | Discharge: 2023-03-16 | Disposition: A | Discharge status: Home / Self Care | Payer: Medicare HMO | Source: Ambulatory Visit | Attending: Family Medicine | Admitting: Family Medicine

## 2023-03-16 DIAGNOSIS — K8070 Calculus of gallbladder and bile duct without cholecystitis without obstruction: Secondary | ICD-10-CM | POA: Diagnosis not present

## 2023-03-16 DIAGNOSIS — R932 Abnormal findings on diagnostic imaging of liver and biliary tract: Secondary | ICD-10-CM | POA: Diagnosis not present

## 2023-03-16 DIAGNOSIS — K5730 Diverticulosis of large intestine without perforation or abscess without bleeding: Secondary | ICD-10-CM | POA: Diagnosis not present

## 2023-03-16 DIAGNOSIS — R1030 Lower abdominal pain, unspecified: Secondary | ICD-10-CM | POA: Diagnosis not present

## 2023-03-16 DIAGNOSIS — K449 Diaphragmatic hernia without obstruction or gangrene: Secondary | ICD-10-CM | POA: Diagnosis not present

## 2023-03-16 MED ORDER — IOHEXOL 300 MG/ML  SOLN
100.0000 mL | Freq: Once | INTRAMUSCULAR | Status: AC | PRN
  Administered 2023-03-16: 100 mL via INTRAVENOUS

## 2023-03-21 ENCOUNTER — Ambulatory Visit: Payer: Self-pay

## 2023-03-21 ENCOUNTER — Other Ambulatory Visit: Payer: Self-pay | Admitting: Family Medicine

## 2023-03-21 DIAGNOSIS — K8071 Calculus of gallbladder and bile duct without cholecystitis with obstruction: Secondary | ICD-10-CM

## 2023-03-21 NOTE — Telephone Encounter (Signed)
10A appt is available for 7/30 with PCP if patient can make this time (appt held for patient) instead of wed 03/23/23  Otherwise, would recommend UC visit.   Ronnald Ramp, MD  [facility]

## 2023-03-21 NOTE — Progress Notes (Unsigned)
      Established patient visit   Patient: Melanie Carrillo   DOB: December 15, 1934   88 y.o. Female  MRN: [id number] Visit Date: 03/22/2023  Today's healthcare provider: Ronnald Ramp, MD   No chief complaint on file.  Subjective      ***  Medications: Outpatient Medications Prior to Visit  Medication Sig   acetaminophen (TYLENOL) 325 MG tablet Take 1-2 tablets (325-650 mg total) by mouth every 6 (six) hours as needed for mild pain (pain score 1-3 or temp > 100.5). (Patient taking differently: Take 650-1,300 mg by mouth as needed (pain).)   brimonidine (ALPHAGAN) 0.2 % ophthalmic solution Place 1 drop into both eyes in the morning and at bedtime.   Cholecalciferol (VITAMIN D-3) 125 MCG (5000 UT) TABS Take 5,000 Units by mouth in the morning.   guaiFENesin (MUCINEX) 600 MG 12 hr tablet Take 600 mg by mouth in the morning and at bedtime.   latanoprost (XALATAN) 0.005 % ophthalmic solution Place 1 drop into both eyes at bedtime.   lovastatin (MEVACOR) 40 MG tablet Take 40 mg by mouth daily with supper.   Melatonin 10 MG TABS Take 40 mg by mouth at bedtime as needed (sleep).   montelukast (SINGULAIR) 10 MG tablet Take 10 mg by mouth at bedtime.   Multiple Vitamins-Minerals (MULTIVITAMIN WITH MINERALS) tablet Take 1 tablet by mouth in the morning.   omeprazole (PRILOSEC) 40 MG capsule Take 40 mg by mouth daily before breakfast.   sertraline (ZOLOFT) 50 MG tablet TAKE 1 TABLET BY MOUTH EVERY DAY   traZODone (DESYREL) 50 MG tablet Take 0.5-1 tablets (25-50 mg total) by mouth at bedtime as needed.   No facility-administered medications prior to visit.    Review of Systems  {Insert previous labs (optional):23779}  {See past labs  Heme  Chem  Endocrine  Serology  Results Review (optional):1}   Objective    LMP  (LMP Unknown)  {Insert last BP/Wt (optional):23777}  {See vitals history (optional):1}  Physical Exam  ***  No results found for any visits on 03/22/23.   Assessment & Plan     Problem List Items Addressed This Visit   None    No follow-ups on file.         Ronnald Ramp, MD  [facility] [phone] (phone) [phone] (fax)  [facility]

## 2023-03-21 NOTE — Telephone Encounter (Signed)
  Chief Complaint: Knee pain - cannot put weight on left leg Symptoms: pain - mild swelling Frequency: Friday - after exercise Pertinent Negatives: Patient denies fever Disposition: [] ED /[] Urgent Care (no appt availability in office) / [x] Appointment(In office/virtual)/ []  [facility]/ [] Home Care/ [] Refused Recommended Disposition /[]  Mobile Bus/ []  Follow-up with PCP Additional Notes: Pt states that after doing her exercises on Friday her knee started hurting. She is unable to put weight on left leg. She thinks three maybe mild swelling.  First appt available is weds. Scheduled that appt. Pt would like to be seen in office today. Protocol suggests same day visit. Will forward chart to office to work pt in today.  Reason for Disposition  [1] SEVERE pain (e.g., excruciating, unable to walk) AND [2] not improved after 2 hours of pain medicine  Answer Assessment - Initial Assessment Questions 1. LOCATION and RADIATION: "Where is the pain located?"      Left knee 2. QUALITY: "What does the pain feel like?"  (e.g., sharp, dull, aching, burning)     Burning 3. SEVERITY: "How bad is the pain?" "What does it keep you from doing?"   (Scale 1-10; or mild, moderate, severe)   -  MILD (1-3): doesn't interfere with normal activities    -  MODERATE (4-7): interferes with normal activities (e.g., work or school) or awakens from sleep, limping    -  SEVERE (8-10): excruciating pain, unable to do any normal activities, unable to walk     severe 4. ONSET: "When did the pain start?" "Does it come and go, or is it there all the time?"     Friday 5. RECURRENT: "Have you had this pain before?" If Yes, ask: "When, and what happened then?"     no 7. AGGRAVATING FACTORS: "What makes the knee pain worse?" (e.g., walking, climbing stairs, running)     standing 8. ASSOCIATED SYMPTOMS: "Is there any swelling or redness of the knee?"     Swollen 9. OTHER SYMPTOMS: "Do you have any other  symptoms?" (e.g., chest pain, difficulty breathing, fever, calf pain)     *No Answer*  Protocols used: Knee Pain-A-AH

## 2023-03-21 NOTE — Telephone Encounter (Signed)
Appointment changed

## 2023-03-22 ENCOUNTER — Ambulatory Visit: Payer: Medicare HMO | Admitting: Family Medicine

## 2023-03-22 ENCOUNTER — Encounter: Payer: Self-pay | Admitting: Family Medicine

## 2023-03-22 ENCOUNTER — Ambulatory Visit
Admission: RE | Admit: 2023-03-22 | Discharge: 2023-03-22 | Disposition: A | Discharge status: Home / Self Care | Payer: Medicare HMO | Attending: Family Medicine | Admitting: Family Medicine

## 2023-03-22 ENCOUNTER — Ambulatory Visit
Admission: RE | Admit: 2023-03-22 | Discharge: 2023-03-22 | Disposition: A | Discharge status: Home / Self Care | Payer: Medicare HMO | Source: Ambulatory Visit | Attending: Family Medicine | Admitting: Family Medicine

## 2023-03-22 VITALS — BP 80/59 | HR 110 | Temp 98.0°F | Resp 16 | Ht 62.0 in | Wt 144.0 lb

## 2023-03-22 DIAGNOSIS — K8071 Calculus of gallbladder and bile duct without cholecystitis with obstruction: Secondary | ICD-10-CM | POA: Diagnosis not present

## 2023-03-22 DIAGNOSIS — M1712 Unilateral primary osteoarthritis, left knee: Secondary | ICD-10-CM | POA: Diagnosis not present

## 2023-03-22 DIAGNOSIS — M85862 Other specified disorders of bone density and structure, left lower leg: Secondary | ICD-10-CM | POA: Diagnosis not present

## 2023-03-22 DIAGNOSIS — M25562 Pain in left knee: Secondary | ICD-10-CM | POA: Diagnosis not present

## 2023-03-22 MED ORDER — METHYLPREDNISOLONE ACETATE 40 MG/ML IJ SUSP
40.0000 mg | Freq: Once | INTRAMUSCULAR | Status: AC
  Administered 2023-03-22: 40 mg via INTRAMUSCULAR

## 2023-03-22 MED ORDER — LIDOCAINE HCL (PF) 1 % IJ SOLN
4.0000 mL | Freq: Once | INTRAMUSCULAR | Status: AC
Start: 2023-03-22 — End: 2023-03-22
  Administered 2023-03-22: 4 mL via INTRADERMAL

## 2023-03-22 NOTE — Patient Instructions (Addendum)
Please report to [facility] located at:  [place], [place]  You do not need an appointment to have xrays completed.   Our office will follow up with  results once available.

## 2023-03-22 NOTE — Assessment & Plan Note (Signed)
Acute onset following exercise, with pain on the medial side and possible effusion. No redness or warmth. Pain on extension and palpation. Likely arthritic changes. -Order left knee x-ray (4 views including sunrise). -Aspirate knee joint fluid for analysis. -If fluid appears normal, inject steroid to help with pain, see procedure note

## 2023-03-22 NOTE — Assessment & Plan Note (Signed)
Recent CT showed gallstones and dilated duct. No current symptoms reported. -reviewed results of abdominal CT  -Referred to Gastroenterology. -Order MRCP for further evaluatio

## 2023-03-23 ENCOUNTER — Ambulatory Visit: Payer: Medicare HMO | Admitting: Family Medicine

## 2023-03-26 ENCOUNTER — Ambulatory Visit
Admission: RE | Admit: 2023-03-26 | Discharge: 2023-03-26 | Disposition: A | Discharge status: Home / Self Care | Payer: Medicare HMO | Source: Ambulatory Visit | Attending: Family Medicine | Admitting: Family Medicine

## 2023-03-26 ENCOUNTER — Other Ambulatory Visit: Payer: Self-pay | Admitting: Family Medicine

## 2023-03-26 DIAGNOSIS — K8071 Calculus of gallbladder and bile duct without cholecystitis with obstruction: Secondary | ICD-10-CM | POA: Insufficient documentation

## 2023-03-26 DIAGNOSIS — K449 Diaphragmatic hernia without obstruction or gangrene: Secondary | ICD-10-CM | POA: Diagnosis not present

## 2023-03-26 DIAGNOSIS — N281 Cyst of kidney, acquired: Secondary | ICD-10-CM | POA: Diagnosis not present

## 2023-03-26 DIAGNOSIS — C189 Malignant neoplasm of colon, unspecified: Secondary | ICD-10-CM | POA: Diagnosis not present

## 2023-03-26 DIAGNOSIS — K8070 Calculus of gallbladder and bile duct without cholecystitis without obstruction: Secondary | ICD-10-CM | POA: Diagnosis not present

## 2023-03-26 MED ORDER — GADOBUTROL 1 MMOL/ML IV SOLN
6.0000 mL | Freq: Once | INTRAVENOUS | Status: AC | PRN
  Administered 2023-03-26: 6 mL via INTRAVENOUS

## 2023-03-29 ENCOUNTER — Telehealth: Payer: Self-pay

## 2023-03-29 ENCOUNTER — Encounter: Payer: Self-pay | Admitting: Family Medicine

## 2023-03-29 DIAGNOSIS — K8071 Calculus of gallbladder and bile duct without cholecystitis with obstruction: Secondary | ICD-10-CM

## 2023-03-29 NOTE — Telephone Encounter (Signed)
Rhonda from [facility] radiology called with critical lab results.   IMPRESSION: 1. Choledocholithiasis with multiple large calculi within the common hepatic duct and common bile duct to the ampulla, largest calculi within the proximal common bile duct measuring up to 1.1 cm. Common bile duct measures up to 1.3 cm in caliber. Moderate intrahepatic biliary ductal dilatation. 2. Additional sludge and gallstones in the gallbladder. 3. Incidental note of pancreas divisum. Pancreatic ducts measure up to 0.6 cm in caliber without obstruction identified to the ampullae. No pancreatic ductal dilatation or surrounding inflammatory changes. 4. Moderate hiatal hernia with intrathoracic position of the gastric fundus.   These results will be called to the ordering clinician or representative by the Radiologist Assistant, and communication documented in the PACS or [employer].     Electronically Signed   By: Jearld Lesch M.D.   On: 03/29/2023 16:48

## 2023-03-30 NOTE — Addendum Note (Signed)
Addended by: Hyacinth Meeker on: 03/30/2023 04:40 PM   Modules accepted: Orders

## 2023-03-30 NOTE — Telephone Encounter (Signed)
Please advise patient of MRI results showing gallstones in the common bile duct showing signs of obstruction as well as hiatal hernia.   I recommend evaluation with general surgery given these imaging findings and if she does not wish to see surgery, at least consultation with gastroenterology.

## 2023-03-31 ENCOUNTER — Other Ambulatory Visit: Payer: Self-pay | Admitting: Family Medicine

## 2023-03-31 ENCOUNTER — Telehealth: Payer: Self-pay

## 2023-03-31 DIAGNOSIS — K8071 Calculus of gallbladder and bile duct without cholecystitis with obstruction: Secondary | ICD-10-CM

## 2023-03-31 NOTE — Telephone Encounter (Signed)
Pt offered appt for 8/14, daughter stated that she had other appts that day and could not make it... Pt scheduled for 8/20

## 2023-03-31 NOTE — Telephone Encounter (Signed)
Darren I just got a referral for CBD stones on MRCP  Schedule OV

## 2023-04-04 ENCOUNTER — Encounter: Payer: Self-pay | Admitting: Surgery

## 2023-04-04 ENCOUNTER — Ambulatory Visit: Payer: Medicare HMO | Admitting: Surgery

## 2023-04-04 VITALS — BP 95/67 | HR 112 | Temp 98.3°F | Ht 64.0 in | Wt 146.2 lb

## 2023-04-04 DIAGNOSIS — K8044 Calculus of bile duct with chronic cholecystitis without obstruction: Secondary | ICD-10-CM | POA: Diagnosis not present

## 2023-04-04 NOTE — Patient Instructions (Signed)
If you have any concerns or questions, please feel free to call our office. See follow up appointment.   Cholelithiasis  Cholelithiasis happens when gallstones form in the gallbladder. The gallbladder stores bile. Bile is a fluid that helps digest fats. Bile can harden and form into gallstones. If they cause a blockage, they can cause pain (gallbladder attack). What are the causes? This condition may be caused by: Too much bilirubin in the bile. This happens if you have sickle cell anemia. Too much of a fat-like substance (cholesterol) in your bile. Not enough bile salts in your bile. These salts help the body absorb and digest fats. The gallbladder not emptying fully or often enough. This is common in pregnant women. What increases the risk? The following factors may make you more likely to develop this condition: Being older than age 38. Eating a lot of fried foods, fat, and refined carbs (refined carbohydrates). Being female. Being pregnant many times. Using medicines with female hormones in them for a long time. Losing weight fast. Having gallstones in your family. Having health problems, such as diabetes, obesity, Crohn's disease, or liver disease. What are the signs or symptoms? Often, there may be gallstones but no symptoms. These gallstones are called silent gallstones. If a gallstone causes a blockage, you may get sudden pain. The pain: Can be in the upper right part of your belly (abdomen). Normally comes at night or after you eat. Can last an hour or more. Can spread to your right shoulder, back, or chest. Can feel like discomfort, burning, or fullness in the upper part of your belly (indigestion). If the blockage lasts more than a few hours, you can get an infection or swelling. You may: Vomit or feel like you may vomit (nauseous). Feel bloated. Have belly pain for 5 hours or more. Feel tender in your belly, often in the upper right part and under your ribs. Have a fever  or chills. Have skin or the white parts of your eyes turn yellow (jaundice). Have dark pee (urine) or pale poop (stool). How is this treated? Treatment for this condition depends on how bad you feel. If you have symptoms, you may need: Home care, if symptoms are not very bad. Do not eat for 12-24 hours. Drink only water and clear liquids. After 1 or 2 days, start to eat simple or clear foods. Try broth and crackers. You may need medicines for pain or stomach upset or both. If you have an infection, you will need antibiotics. A hospital stay, if you have very bad pain or a very bad infection. Surgery to remove your gallbladder. You may need this if: Gallstones keep coming back. You have very bad symptoms. Medicines to break up gallstones. Medicines may be used for 6-12 months. A procedure to find and take out gallstones or to break up gallstones. Follow these instructions at home: Medicines Take over-the-counter and prescription medicines only as told by your doctor. If you were prescribed antibiotics, take them as told by your doctor. Do not stop taking them even if you start to feel better. Ask your doctor if you should avoid driving or using machines while you are taking your medicine. Eating and drinking Drink enough fluid to keep your pee pale yellow. Drink water or clear fluids. This is important when you have pain. Eat healthy foods. Choose: Fewer fatty foods, such as fried foods. Fewer refined carbs. Avoid breads and grains that are highly processed, such as white bread and white rice. Choose  whole grains, such as whole-wheat bread and brown rice. More fiber. Almonds, fresh fruit, and beans are healthy sources. General instructions Keep a healthy weight. Keep all follow-up visits. You may need to see a specialist or a Careers adviser. Where to find more information [employer] of Diabetes and Digestive and Kidney Diseases: [email or web address] Contact a doctor if: You have sudden pain  in the upper right part of your belly. Pain might spread to your right shoulder, back, or chest. Your pain lasts more than 2 hours. You have been diagnosed with gallstones that have no symptoms and you get: Belly pain. Discomfort, burning, or fullness in the upper part of your abdomen. You keep feeling like you may vomit. You have dark pee or pale poop. Get help right away if: You have pain in your abdomen, that: Lasts more than 5 hours. Keeps getting worse. You have a fever or chills. You can't stop vomiting. Your skin or the white parts of your eyes turn yellow. This information is not intended to replace advice given to you by your health care provider. Make sure you discuss any questions you have with your health care provider. Document Revised: 05/24/2022 Document Reviewed: 05/24/2022 Elsevier Patient Education  2024 [employer].

## 2023-04-05 NOTE — Progress Notes (Signed)
Patient ID: Melanie Carrillo, female   DOB: [birth year], 88 y.o.   MRN: [id number]  HPI Melanie Carrillo is a 88 y.o. female seen in consultation at the request of Dr. Neita Garnet.  She has been having some abdominal pain that is diffuse, mild to moderate, dull  intermittent this has been present for about a month.  She also experiences some nausea decreased appetite.  No fevers no chills no evidence of biliary obstruction.  Primary care provider initially ordered a CT scan followed by MRCP.  Please note that I have personally reviewed the images there is evidence of cholelithiasis and also evidence of choledocholithiasis.  CBD is 1.3 cm. CMP nml w nml bili Last year she did have a fall and fractured her left hip.  She has since then had a prolonged rocky recovery since she did have an infection of the left hip hardware needed to be removed.  Initially operated by Dr. Okey Dupre here at [facility] and then had to be transferred to [facility] for trauma orthopedic surgeons Dr. Jena Gauss did multiple irrigation and debridements and removal of hardware.  He is starting to recover from this.  She uses a walker.  Her mind is pristine.  Comes with her daughter who is Melanie Carrillo. He does have a significant history of colon cancer that required colectomy.  Apparently complicated by evisceration and hernia repair that subsequently required mesh removal and/replacement.  She has had at least 3 major abdominal operations.  The daughter tells me that this was a prolonged hospital stay decades ago. Prior appendectomy and hysterectomy. I do not have access to any of the operative reports.  Apparently was done in an outside hospital. She did have a hx of chronic pain. Hx interstitial lung dz.  HPI  Past Medical History:  Diagnosis Date   Allergy    Anxiety    Arthritis    Cataract    Colon cancer (HCC)    Constipation    Dyspnea    GERD (gastroesophageal reflux disease)    Glaucoma    HLD (hyperlipidemia)    Hypertension     Obstructive sleep apnea 01/29/2014   No Cpap   Osteoporosis    Substance abuse (HCC)     Past Surgical History:  Procedure Laterality Date   APPENDECTOMY     CARDIAC CATHETERIZATION     COLON SURGERY     COLONOSCOPY     EYE SURGERY     cataracts   FEMUR IM NAIL Left 04/28/2022   Procedure: REVISION FIXATION OF LEFT FEMUR FRACTURE;  Surgeon: Roby Lofts, MD;  Location: [facility] OR;  Service: Orthopedics;  Laterality: Left;   FRACTURE SURGERY     HARDWARE REMOVAL Left 04/28/2022   Procedure: HARDWARE REMOVAL;  Surgeon: Roby Lofts, MD;  Location: [facility] OR;  Service: Orthopedics;  Laterality: Left;   HERNIA REPAIR     I & D EXTREMITY Left 06/23/2022   Procedure: IRRIGATION AND DEBRIDEMENT LEFT HIP;  Surgeon: Roby Lofts, MD;  Location: [facility] OR;  Service: Orthopedics;  Laterality: Left;   I & D EXTREMITY Left 07/28/2022   Procedure: IRRIGATION AND DEBRIDEMENT HIP;  Surgeon: Roby Lofts, MD;  Location: [facility] OR;  Service: Orthopedics;  Laterality: Left;   INTRAMEDULLARY (IM) NAIL INTERTROCHANTERIC Left 12/26/2021   Procedure: INTRAMEDULLARY (IM) NAIL INTERTROCHANTRIC;  Surgeon: Ross Marcus, MD;  Location: [facility] ORS;  Service: Orthopedics;  Laterality: Left;   MULTIPLE TOOTH EXTRACTIONS     full dentures  shoulder  surgery Left    TONSILLECTOMY     TUBAL LIGATION     UPPER GI ENDOSCOPY     WRIST SURGERY Right     Family History  Problem Relation Age of Onset   Hypertension Mother    Heart disease Mother    Heart disease Father     Social History Social History   Tobacco Use   Smoking status: Never   Smokeless tobacco: Never  Vaping Use   Vaping status: Never Used  Substance Use Topics   Alcohol use: Never   Drug use: Never    No Known Allergies  Current Outpatient Medications  Medication Sig Dispense Refill   acetaminophen (TYLENOL) 325 MG tablet Take 1-2 tablets (325-650 mg total) by mouth every 6 (six) hours as needed for mild pain (pain score 1-3 or temp  > 100.5). (Patient taking differently: Take 650-1,300 mg by mouth as needed (pain).) 60 tablet 0   brimonidine (ALPHAGAN) 0.2 % ophthalmic solution Place 1 drop into both eyes in the morning and at bedtime.     Cholecalciferol (VITAMIN D-3) 125 MCG (5000 UT) TABS Take 5,000 Units by mouth in the morning.     guaiFENesin (MUCINEX) 600 MG 12 hr tablet Take 600 mg by mouth in the morning and at bedtime.     latanoprost (XALATAN) 0.005 % ophthalmic solution Place 1 drop into both eyes at bedtime.     lovastatin (MEVACOR) 40 MG tablet Take 40 mg by mouth daily with supper.     Melatonin 10 MG TABS Take 40 mg by mouth at bedtime as needed (sleep).     montelukast (SINGULAIR) 10 MG tablet Take 10 mg by mouth at bedtime.     Multiple Vitamins-Minerals (MULTIVITAMIN WITH MINERALS) tablet Take 1 tablet by mouth in the morning.     omeprazole (PRILOSEC) 40 MG capsule Take 40 mg by mouth daily before breakfast.     sertraline (ZOLOFT) 50 MG tablet TAKE 1 TABLET BY MOUTH EVERY DAY 90 tablet 1   simethicone (MYLICON) 80 MG chewable tablet Chew 80 mg by mouth daily in the afternoon.     traZODone (DESYREL) 50 MG tablet Take 0.5-1 tablets (25-50 mg total) by mouth at bedtime as needed. 60 tablet 3   No current facility-administered medications for this visit.     Review of Systems Full ROS  was asked and was negative except for the information on the HPI  Physical Exam Blood pressure 95/67, pulse (!) 112, temperature 98.3 F (36.8 C), temperature source Oral, height 5\' 4"  (1.626 m), weight 146 lb 3.2 oz (66.3 kg), SpO2 97%. CONSTITUTIONAL: NAD. EYES: Pupils are equal, round, Sclera are non-icteric. EARS, NOSE, MOUTH AND THROAT: The oropharynx is clear. The oral mucosa is pink and moist. Hearing is intact to voice. LYMPH NODES:  Lymph nodes in the neck are normal. RESPIRATORY:  Lungs are clear. There is normal respiratory effort, with equal breath sounds bilaterally, and without pathologic use of  accessory muscles. CARDIOVASCULAR: Heart is regular without murmurs, gallops, or rubs. GI: Midline generous laparotomy scar from pubis to xiphoid. The abdomen is soft, nontender, and nondistended. There are no palpable masses. There is no hepatosplenomegaly. There are normal bowel sounds  GU: Rectal deferred.   MUSCULOSKELETAL: Normal muscle strength and tone. No cyanosis or edema.   SKIN: Turgor is good and there are no pathologic skin lesions or ulcers. NEUROLOGIC: Motor and sensation is grossly normal. Cranial nerves are grossly intact. PSYCH:  Oriented to  person, place and time. Affect is normal.  Data Reviewed  I have personally reviewed the patient's imaging, laboratory findings and medical records.    Assessment/  Plan 88 year old female with multiple abdominal operations and what seems to be a hostile abdomen.  Now with choledocholithiasis that is symptomatic but she is not toxic and there is no evidence of cholangitis.  Does have an upcoming appointment with GI for ERCP.  Definitely recommend ERCP first to decompress her biliary tree. Herbie Baltimore potential operative intervention I do think that cholecystectomy is indicated.  Concern about her intra-abdominal lesions and the hostility of her abdominal cavity.  I might entertain doing a Veress needle and do a diagnostic laparoscopy first and if there is no window I would not hesitate to do an old school fashion open cholecystectomy.  I Had an extensive discussion with the patient and the daughter who is an Melanie Carrillo.  They are fully aware of the situation.  I would like to see her in a few weeks once she completes her ERCP.  Currently she is not toxic not septic not peritonitic and does not need emergent surgical intervention at this time.  Please note that I spent greater than 60 minutes in this encounter including personally reviewing medical records, counseling the patient and family, placing orders and performing documentation  Sterling Big, MD  FACS General Surgeon 04/05/2023, 7:49 AM

## 2023-04-07 ENCOUNTER — Ambulatory Visit: Payer: Self-pay | Admitting: Surgery

## 2023-04-12 ENCOUNTER — Encounter: Payer: Self-pay | Admitting: Gastroenterology

## 2023-04-12 ENCOUNTER — Ambulatory Visit: Payer: Medicare HMO | Admitting: Gastroenterology

## 2023-04-12 VITALS — BP 114/63 | HR 108 | Temp 97.7°F | Ht 64.0 in | Wt 147.0 lb

## 2023-04-12 DIAGNOSIS — K8050 Calculus of bile duct without cholangitis or cholecystitis without obstruction: Secondary | ICD-10-CM | POA: Diagnosis not present

## 2023-04-12 NOTE — Addendum Note (Signed)
Addended by: Roena Malady on: 04/12/2023 04:16 PM   Modules accepted: Orders

## 2023-04-12 NOTE — Progress Notes (Signed)
Gastroenterology Consultation  Referring Provider:     Brett Albino* Primary Care Physician:  Ronnald Ramp, MD Primary Gastroenterologist:  Dr. Servando Snare     Reason for Consultation:     Common bile duct stone        HPI:   Melanie Carrillo is a 88 y.o. y/o female referred for consultation & management of common bile duct stone by Dr. Neita Garnet, Tawanna Cooler, MD. This patient comes to see me after being seen in the past by Dr. Mia Creek but had imaging that showed:  IMPRESSION: 1. Choledocholithiasis with multiple large calculi within the common hepatic duct and common bile duct to the ampulla, largest calculi within the proximal common bile duct measuring up to 1.1 cm. Common bile duct measures up to 1.3 cm in caliber. Moderate intrahepatic biliary ductal dilatation. 2. Additional sludge and gallstones in the gallbladder. 3. Incidental note of pancreas divisum. Pancreatic ducts measure up to 0.6 cm in caliber without obstruction identified to the ampullae. No pancreatic ductal dilatation or surrounding inflammatory changes. 4. Moderate hiatal hernia with intrathoracic position of the gastric fundus.  The patient was seen by surgery who recommended that the patient be decompressed with a ERCP prior to any consideration for surgery. The patient reports that her symptoms happen intermittently with nausea after eating and she is fed up with the symptoms and would like to have the stones taken out.  Past Medical History:  Diagnosis Date   Allergy    Anxiety    Arthritis    Cataract    Colon cancer (HCC)    Constipation    Dyspnea    GERD (gastroesophageal reflux disease)    Glaucoma    HLD (hyperlipidemia)    Hypertension    Obstructive sleep apnea 01/29/2014   No Cpap   Osteoporosis    Substance abuse (HCC)     Past Surgical History:  Procedure Laterality Date   APPENDECTOMY     CARDIAC CATHETERIZATION     COLON SURGERY     COLONOSCOPY     EYE  SURGERY     cataracts   FEMUR IM NAIL Left 04/28/2022   Procedure: REVISION FIXATION OF LEFT FEMUR FRACTURE;  Surgeon: Roby Lofts, MD;  Location: [facility];  Service: Orthopedics;  Laterality: Left;   FRACTURE SURGERY     HARDWARE REMOVAL Left 04/28/2022   Procedure: HARDWARE REMOVAL;  Surgeon: Roby Lofts, MD;  Location: [facility];  Service: Orthopedics;  Laterality: Left;   HERNIA REPAIR     I & D EXTREMITY Left 06/23/2022   Procedure: IRRIGATION AND DEBRIDEMENT LEFT HIP;  Surgeon: Roby Lofts, MD;  Location: [facility];  Service: Orthopedics;  Laterality: Left;   I & D EXTREMITY Left 07/28/2022   Procedure: IRRIGATION AND DEBRIDEMENT HIP;  Surgeon: Roby Lofts, MD;  Location: [facility];  Service: Orthopedics;  Laterality: Left;   INTRAMEDULLARY (IM) NAIL INTERTROCHANTERIC Left 12/26/2021   Procedure: INTRAMEDULLARY (IM) NAIL INTERTROCHANTRIC;  Surgeon: Ross Marcus, MD;  Location: [facility];  Service: Orthopedics;  Laterality: Left;   MULTIPLE TOOTH EXTRACTIONS     full dentures   shoulder  surgery Left    TONSILLECTOMY     TUBAL LIGATION     UPPER GI ENDOSCOPY     WRIST SURGERY Right     Prior to Admission medications   Medication Sig Start Date End Date Taking? Authorizing Provider  acetaminophen (TYLENOL) 325 MG tablet Take 1-2 tablets (325-650 mg total) by mouth every  6 (six) hours as needed for mild pain (pain score 1-3 or temp > 100.5). Patient taking differently: Take 650-1,300 mg by mouth as needed (pain). 12/31/21   Sunnie Nielsen, DO  brimonidine (ALPHAGAN) 0.2 % ophthalmic solution Place 1 drop into both eyes in the morning and at bedtime. 12/15/21   [provider]  Cholecalciferol (VITAMIN D-3) 125 MCG (5000 UT) TABS Take 5,000 Units by mouth in the morning.    [provider]  guaiFENesin (MUCINEX) 600 MG 12 hr tablet Take 600 mg by mouth in the morning and at bedtime. 05/06/22   [provider]  latanoprost (XALATAN) 0.005 % ophthalmic  solution Place 1 drop into both eyes at bedtime. 11/13/21   [provider]  lovastatin (MEVACOR) 40 MG tablet Take 40 mg by mouth daily with supper. 11/10/21   [provider]  Melatonin 10 MG TABS Take 40 mg by mouth at bedtime as needed (sleep).    [provider]  montelukast (SINGULAIR) 10 MG tablet Take 10 mg by mouth at bedtime. 11/10/21   [provider]  Multiple Vitamins-Minerals (MULTIVITAMIN WITH MINERALS) tablet Take 1 tablet by mouth in the morning.    [provider]  omeprazole (PRILOSEC) 40 MG capsule Take 40 mg by mouth daily before breakfast. 10/05/21   [provider]  sertraline (ZOLOFT) 50 MG tablet TAKE 1 TABLET BY MOUTH EVERY DAY 11/23/22   [name], Tawanna Cooler, MD  simethicone (MYLICON) 80 MG chewable tablet Chew 80 mg by mouth daily in the afternoon.    [provider]  traZODone (DESYREL) 50 MG tablet Take 0.5-1 tablets (25-50 mg total) by mouth at bedtime as needed. 02/28/23   [name], Tawanna Cooler, MD    Family History  Problem Relation Age of Onset   Hypertension Mother    Heart disease Mother    Heart disease Father      Social History   Tobacco Use   Smoking status: Never   Smokeless tobacco: Never  Vaping Use   Vaping status: Never Used  Substance Use Topics   Alcohol use: Never   Drug use: Never    Allergies as of 04/12/2023   (No Known Allergies)    Review of Systems:    All systems reviewed and negative except where noted in HPI.   Physical Exam:  LMP  (LMP Unknown)  No LMP recorded (lmp unknown). Patient is postmenopausal. General:   Alert,  Well-developed, well-nourished, pleasant and cooperative in NAD Head:  Normocephalic and atraumatic. Eyes:  Sclera clear, no icterus.   Conjunctiva pink. Ears:  Normal auditory acuity. Extremities:  No clubbing or edema.  No cyanosis. Neurologic:  Alert and oriented x3;  grossly normal neurologically. Skin:  Intact without significant  lesions or rashes.  No jaundice. Psych:  Alert and cooperative. Normal mood and affect.  Imaging Studies: MR ABDOMEN MRCP W WO CONTAST  Result Date: 03/29/2023 CLINICAL DATA:  Cholelithiasis, choledocholithiasis, history of colon cancer abdominal pain EXAM: MRI ABDOMEN WITHOUT AND WITH CONTRAST (INCLUDING MRCP) TECHNIQUE: Multiplanar multisequence MR imaging of the abdomen was performed both before and after the administration of intravenous contrast. Heavily T2-weighted images of the biliary and pancreatic ducts were obtained, and three-dimensional MRCP images were rendered by post processing. CONTRAST:  6mL GADAVIST GADOBUTROL 1 MMOL/ML IV SOLN COMPARISON:  CT abdomen pelvis, 03/16/2023 FINDINGS: Lower chest: No acute abnormality. Moderate hiatal hernia with intrathoracic position of the gastric fundus. Hepatobiliary: No solid liver abnormality is seen. Sludge and gallstones in the  gallbladder. Multiple large calculi within the common hepatic duct and common bile duct to the ampulla, largest calculi within the proximal common bile duct measuring up to 1.1 cm (series 4, image 18, 21). Common bile duct measures up to 1.3 cm in caliber. Moderate intrahepatic biliary ductal dilatation. Pancreas: Incidental note of pancreas divisum. Pancreatic ducts measure up to 0.6 cm in caliber without obstruction identified to the ampulla. No pancreatic ductal dilatation or surrounding inflammatory changes. Spleen: Normal in size without significant abnormality. Adrenals/Urinary Tract: Adrenal glands are unremarkable. Simple, benign bilateral renal cortical cysts, for which no further follow-up or characterization is required. Kidneys are otherwise normal, without renal calculi, solid lesion, or hydronephrosis. Stomach/Bowel: Stomach is within normal limits. No evidence of bowel wall thickening, distention, or inflammatory changes. Vascular/Lymphatic: No significant vascular findings are present. No enlarged abdominal lymph  nodes. Other: No abdominal wall hernia or abnormality. No ascites. Musculoskeletal: No acute or significant osseous findings. IMPRESSION: 1. Choledocholithiasis with multiple large calculi within the common hepatic duct and common bile duct to the ampulla, largest calculi within the proximal common bile duct measuring up to 1.1 cm. Common bile duct measures up to 1.3 cm in caliber. Moderate intrahepatic biliary ductal dilatation. 2. Additional sludge and gallstones in the gallbladder. 3. Incidental note of pancreas divisum. Pancreatic ducts measure up to 0.6 cm in caliber without obstruction identified to the ampullae. No pancreatic ductal dilatation or surrounding inflammatory changes. 4. Moderate hiatal hernia with intrathoracic position of the gastric fundus. These results will be called to the ordering clinician or representative by the Radiologist Assistant, and communication documented in the PACS or [employer]. Electronically Signed   By: Jearld Lesch M.D.   On: 03/29/2023 16:48   MR 3D Recon At Scanner  Result Date: 03/29/2023 CLINICAL DATA:  Cholelithiasis, choledocholithiasis, history of colon cancer abdominal pain EXAM: MRI ABDOMEN WITHOUT AND WITH CONTRAST (INCLUDING MRCP) TECHNIQUE: Multiplanar multisequence MR imaging of the abdomen was performed both before and after the administration of intravenous contrast. Heavily T2-weighted images of the biliary and pancreatic ducts were obtained, and three-dimensional MRCP images were rendered by post processing. CONTRAST:  6mL GADAVIST GADOBUTROL 1 MMOL/ML IV SOLN COMPARISON:  CT abdomen pelvis, 03/16/2023 FINDINGS: Lower chest: No acute abnormality. Moderate hiatal hernia with intrathoracic position of the gastric fundus. Hepatobiliary: No solid liver abnormality is seen. Sludge and gallstones in the gallbladder. Multiple large calculi within the common hepatic duct and common bile duct to the ampulla, largest calculi within the proximal common bile  duct measuring up to 1.1 cm (series 4, image 18, 21). Common bile duct measures up to 1.3 cm in caliber. Moderate intrahepatic biliary ductal dilatation. Pancreas: Incidental note of pancreas divisum. Pancreatic ducts measure up to 0.6 cm in caliber without obstruction identified to the ampulla. No pancreatic ductal dilatation or surrounding inflammatory changes. Spleen: Normal in size without significant abnormality. Adrenals/Urinary Tract: Adrenal glands are unremarkable. Simple, benign bilateral renal cortical cysts, for which no further follow-up or characterization is required. Kidneys are otherwise normal, without renal calculi, solid lesion, or hydronephrosis. Stomach/Bowel: Stomach is within normal limits. No evidence of bowel wall thickening, distention, or inflammatory changes. Vascular/Lymphatic: No significant vascular findings are present. No enlarged abdominal lymph nodes. Other: No abdominal wall hernia or abnormality. No ascites. Musculoskeletal: No acute or significant osseous findings. IMPRESSION: 1. Choledocholithiasis with multiple large calculi within the common hepatic duct and common bile duct to the ampulla, largest calculi within the proximal common bile duct measuring up to  1.1 cm. Common bile duct measures up to 1.3 cm in caliber. Moderate intrahepatic biliary ductal dilatation. 2. Additional sludge and gallstones in the gallbladder. 3. Incidental note of pancreas divisum. Pancreatic ducts measure up to 0.6 cm in caliber without obstruction identified to the ampullae. No pancreatic ductal dilatation or surrounding inflammatory changes. 4. Moderate hiatal hernia with intrathoracic position of the gastric fundus. These results will be called to the ordering clinician or representative by the Radiologist Assistant, and communication documented in the PACS or [employer]. Electronically Signed   By: Jearld Lesch M.D.   On: 03/29/2023 16:48   DG Knee 4 Views W/Patella Left  Result  Date: 03/28/2023 CLINICAL DATA:  knee pain EXAM: LEFT KNEE - COMPLETE 4+ VIEW COMPARISON:  April 28, 2022 FINDINGS: Osteopenia. No acute fracture or dislocation. Minimal tricompartmental degenerative changes. Chondrocalcinosis. Status post intramedullary rod fixation of the LEFT femur with partial visualization of the femoral stem component. Trace amount of lucency abutting the distal screws of the femur. No area of erosion or osseous destruction. No unexpected radiopaque foreign body. Soft tissues are unremarkable. IMPRESSION: 1. No acute fracture or dislocation. 2. Status post intramedullary rod fixation of the LEFT femur with partial visualization of the femoral stem component. Trace amount of lucency abutting the distal screws of the femur could reflect healing response versus slight loosening. Recommend attention on follow-up. Electronically Signed   By: Meda Klinefelter M.D.   On: 03/28/2023 14:06   CT Abdomen Pelvis W Contrast  Result Date: 03/17/2023 CLINICAL DATA:  Abdominal pain. History of colon cancer and resection. EXAM: CT ABDOMEN AND PELVIS WITH CONTRAST TECHNIQUE: Multidetector CT imaging of the abdomen and pelvis was performed using the standard protocol following bolus administration of intravenous contrast. RADIATION DOSE REDUCTION: This exam was performed according to the departmental dose-optimization program which includes automated exposure control, adjustment of the mA and/or kV according to patient size and/or use of iterative reconstruction technique. CONTRAST:  OMNIPAQUE IOHEXOL 300 MG/ML  SOLN COMPARISON:  None Available. FINDINGS: Lower chest: Bibasilar subpleural atelectasis/scarring. The visualized lung bases are otherwise clear. There is coronary vascular calcification. No intra-abdominal free air or free fluid. Hepatobiliary: The liver is unremarkable. There is biliary ductal dilatation. Several stones noted in the gallbladder. There is thickened appearance of the  gallbladder wall. Multiple stones also noted in the common bile duct. The common bile duct is dilated measuring 16 mm in diameter. Further evaluation with MRI/MRCP is recommended. Pancreas: Unremarkable. No pancreatic ductal dilatation or surrounding inflammatory changes. Spleen: Normal in size without focal abnormality. Adrenals/Urinary Tract: The adrenal glands are unremarkable. There is no hydronephrosis on either side. There is symmetric enhancement and excretion of contrast by both kidneys. The visualized ureters appear unremarkable. The urinary bladder is collapsed. Stomach/Bowel: There is a moderate size hiatal hernia. There is moderate sigmoid diverticulosis without active inflammatory changes. Postsurgical changes of the bowel with ileocolic anastomosis in the right lower quadrant. There is no bowel obstruction or active inflammation. Appendectomy. Vascular/Lymphatic: Mild aortoiliac atherosclerotic disease. The IVC is unremarkable. No portal venous gas. There is no adenopathy. Reproductive: Hysterectomy.  No adnexal masses. Other: Midline vertical anterior pelvic wall incisional scar. Musculoskeletal: Osteopenia with degenerative changes of the spine. Old L3 compression fracture. Status post ORIF of the left femoral neck fracture. No acute osseous pathology. IMPRESSION: 1. Cholelithiasis and choledocholithiasis with biliary ductal dilatation. Further evaluation with MRI/MRCP is recommended. 2. Moderate size hiatal hernia. 3. Sigmoid diverticulosis. No bowel obstruction. 4.  Aortic Atherosclerosis (  ICD10-I70.0). Electronically Signed   By: Elgie Collard M.D.   On: 03/17/2023 03:29    Assessment and Plan:   Melanie Carrillo is a 88 y.o. y/o female who comes in today with a MRCP showing multiple stones in the bile duct.  The patient also has seen surgery and they are planning to do a cholecystectomy after the ERCP.  The patient has been told the risks and benefits including pancreatitis bleeding  perforation and death associated with ERCPs.  The patient states that she understands the risks and benefits and would like to proceed with the ERCP.  The patient will be set up for a time and date this most convenient for the patient.  Patient has been explained the plan and agrees with it.    Midge Minium, MD. Clementeen Graham    Note: This dictation was prepared with Dragon dictation along with smaller phrase technology. Any transcriptional errors that result from this process are unintentional.

## 2023-04-27 ENCOUNTER — Ambulatory Visit: Payer: Medicare HMO | Admitting: Surgery

## 2023-04-28 ENCOUNTER — Ambulatory Visit
Admission: RE | Admit: 2023-04-28 | Discharge: 2023-04-28 | Disposition: A | Discharge status: Home / Self Care | Payer: Medicare HMO | Attending: Gastroenterology | Admitting: Gastroenterology

## 2023-04-28 ENCOUNTER — Encounter: Payer: Self-pay | Admitting: Gastroenterology

## 2023-04-28 ENCOUNTER — Ambulatory Visit: Payer: Medicare HMO

## 2023-04-28 ENCOUNTER — Ambulatory Visit: Payer: Medicare HMO | Admitting: Anesthesiology

## 2023-04-28 ENCOUNTER — Encounter
Admission: RE | Disposition: A | Discharge status: Home / Self Care | Payer: Self-pay | Source: Home / Self Care | Attending: Gastroenterology

## 2023-04-28 DIAGNOSIS — M810 Age-related osteoporosis without current pathological fracture: Secondary | ICD-10-CM | POA: Diagnosis not present

## 2023-04-28 DIAGNOSIS — E785 Hyperlipidemia, unspecified: Secondary | ICD-10-CM | POA: Insufficient documentation

## 2023-04-28 DIAGNOSIS — Z85038 Personal history of other malignant neoplasm of large intestine: Secondary | ICD-10-CM | POA: Insufficient documentation

## 2023-04-28 DIAGNOSIS — K8044 Calculus of bile duct with chronic cholecystitis without obstruction: Secondary | ICD-10-CM

## 2023-04-28 DIAGNOSIS — K8050 Calculus of bile duct without cholangitis or cholecystitis without obstruction: Secondary | ICD-10-CM | POA: Diagnosis not present

## 2023-04-28 DIAGNOSIS — I10 Essential (primary) hypertension: Secondary | ICD-10-CM | POA: Diagnosis not present

## 2023-04-28 DIAGNOSIS — G4733 Obstructive sleep apnea (adult) (pediatric): Secondary | ICD-10-CM | POA: Insufficient documentation

## 2023-04-28 DIAGNOSIS — K219 Gastro-esophageal reflux disease without esophagitis: Secondary | ICD-10-CM | POA: Insufficient documentation

## 2023-04-28 DIAGNOSIS — K8071 Calculus of gallbladder and bile duct without cholecystitis with obstruction: Secondary | ICD-10-CM

## 2023-04-28 HISTORY — PX: REMOVAL OF STONES: SHX5545

## 2023-04-28 HISTORY — PX: ENDOSCOPIC RETROGRADE CHOLANGIOPANCREATOGRAPHY (ERCP) WITH PROPOFOL: SHX5810

## 2023-04-28 HISTORY — PX: SPHINCTEROTOMY: SHX5544

## 2023-04-28 SURGERY — ENDOSCOPIC RETROGRADE CHOLANGIOPANCREATOGRAPHY (ERCP) WITH PROPOFOL
Anesthesia: General

## 2023-04-28 MED ORDER — PROPOFOL 10 MG/ML IV BOLUS
INTRAVENOUS | Status: DC | PRN
  Administered 2023-04-28: 30 mg via INTRAVENOUS

## 2023-04-28 MED ORDER — DICLOFENAC SUPPOSITORY 100 MG
RECTAL | Status: AC
  Filled 2023-04-28: qty 1

## 2023-04-28 MED ORDER — LIDOCAINE HCL (PF) 2 % IJ SOLN
INTRAMUSCULAR | Status: DC | PRN
Start: 2023-04-28 — End: 2023-04-28
  Administered 2023-04-28: 40 mg via INTRADERMAL

## 2023-04-28 MED ORDER — DICLOFENAC SUPPOSITORY 100 MG
100.0000 mg | Freq: Once | RECTAL | Status: AC
  Administered 2023-04-28: 100 mg via RECTAL

## 2023-04-28 MED ORDER — EPHEDRINE SULFATE (PRESSORS) 50 MG/ML IJ SOLN
INTRAMUSCULAR | Status: DC | PRN
  Administered 2023-04-28 (×2): 5 mg via INTRAVENOUS

## 2023-04-28 MED ORDER — PROPOFOL 500 MG/50ML IV EMUL
INTRAVENOUS | Status: DC | PRN
  Administered 2023-04-28: 150 ug/kg/min via INTRAVENOUS

## 2023-04-28 MED ORDER — SODIUM CHLORIDE 0.9 % IV SOLN: INTRAVENOUS | Status: DC

## 2023-04-28 NOTE — Anesthesia Preprocedure Evaluation (Signed)
Anesthesia Evaluation  Patient identified by MRN, date of birth, ID band Patient awake    Reviewed: Allergy & Precautions, NPO status , Patient's Chart, lab work & pertinent test results  History of Anesthesia Complications Negative for: history of anesthetic complications  Airway Mallampati: III  TM Distance: <3 FB Neck ROM: full    Dental  (+) Missing   Pulmonary neg shortness of breath, sleep apnea    Pulmonary exam normal        Cardiovascular Exercise Tolerance: Good hypertension, (-) angina (-) Past MI Normal cardiovascular exam     Neuro/Psych  PSYCHIATRIC DISORDERS       Neuromuscular disease    GI/Hepatic Neg liver ROS,GERD  Controlled,,  Endo/Other  negative endocrine ROS    Renal/GU negative Renal ROS  negative genitourinary   Musculoskeletal   Abdominal   Peds  Hematology negative hematology ROS (+)   Anesthesia Other Findings Patient is NPO appropriate and reports no nausea or vomiting today.  Past Medical History: No date: Allergy No date: Anxiety No date: Arthritis No date: Cataract No date: Colon cancer (HCC) No date: Constipation No date: Dyspnea No date: GERD (gastroesophageal reflux disease) No date: Glaucoma No date: HLD (hyperlipidemia) No date: Hypertension 01/29/2014: Obstructive sleep apnea     Comment:  No Cpap No date: Osteoporosis No date: Substance abuse (HCC)  Past Surgical History: No date: APPENDECTOMY No date: CARDIAC CATHETERIZATION No date: COLON SURGERY No date: COLONOSCOPY No date: EYE SURGERY     Comment:  cataracts 04/28/2022: FEMUR IM NAIL; Left     Comment:  Procedure: REVISION FIXATION OF LEFT FEMUR FRACTURE;                Surgeon: Roby Lofts, MD;  Location: [facility];  Service:              Orthopedics;  Laterality: Left; No date: FRACTURE SURGERY 04/28/2022: HARDWARE REMOVAL; Left     Comment:  Procedure: HARDWARE REMOVAL;  Surgeon: Roby Lofts,                MD;  Location: [facility];  Service: Orthopedics;  Laterality:              Left; No date: HERNIA REPAIR 06/23/2022: I & D EXTREMITY; Left     Comment:  Procedure: IRRIGATION AND DEBRIDEMENT LEFT HIP;                Surgeon: Roby Lofts, MD;  Location: [facility];  Service:              Orthopedics;  Laterality: Left; 07/28/2022: I & D EXTREMITY; Left     Comment:  Procedure: IRRIGATION AND DEBRIDEMENT HIP;  Surgeon:               Roby Lofts, MD;  Location: [facility];  Service:               Orthopedics;  Laterality: Left; 12/26/2021: INTRAMEDULLARY (IM) NAIL INTERTROCHANTERIC; Left     Comment:  Procedure: INTRAMEDULLARY (IM) NAIL INTERTROCHANTRIC;                Surgeon: Ross Marcus, MD;  Location: [facility];                Service: Orthopedics;  Laterality: Left; No date: MULTIPLE TOOTH EXTRACTIONS     Comment:  full dentures No date: shoulder  surgery; Left No date: TONSILLECTOMY No date: TUBAL LIGATION No date: UPPER GI ENDOSCOPY  No date: WRIST SURGERY; Right  BMI    Body Mass Index: 25.46 kg/m      Reproductive/Obstetrics negative OB ROS                             Anesthesia Physical Anesthesia Plan  ASA: 3  Anesthesia Plan: General   Post-op Pain Management:    Induction: Intravenous  PONV Risk Score and Plan: Propofol infusion and TIVA  Airway Management Planned: Natural Airway and Nasal Cannula  Additional Equipment:   Intra-op Plan:   Post-operative Plan:   Informed Consent: I have reviewed the patients History and Physical, chart, labs and discussed the procedure including the risks, benefits and alternatives for the proposed anesthesia with the patient or authorized representative who has indicated his/her understanding and acceptance.     Dental Advisory Given  Plan Discussed with: Anesthesiologist, CRNA and Surgeon  Anesthesia Plan Comments: (Patient consented for risks of anesthesia including but not  limited to:  - adverse reactions to medications - risk of airway placement if required - damage to eyes, teeth, lips or other oral mucosa - nerve damage due to positioning  - sore throat or hoarseness - Damage to heart, brain, nerves, lungs, other parts of body or loss of life  Patient voiced understanding.)       Anesthesia Quick Evaluation

## 2023-04-28 NOTE — Op Note (Signed)
[facility] Patient Name: Melanie Carrillo Procedure Date: 04/28/2023 11:12 AM MRN: [id number] Account #: [id number] Date of Birth: 24-Nov-1934 Admit Type: Outpatient Age: 88 Room: [facility] ENDO ROOM 4 Gender: Female Note Status: Finalized Instrument Name: [id number] Procedure:             ERCP Indications:           Common bile duct stone(s) Providers:             Midge Minium MD, MD Referring MD:          No Local Md, MD (Referring MD) Medicines:             Propofol per Anesthesia Complications:         No immediate complications. Procedure:             Pre-Anesthesia Assessment:                        - Prior to the procedure, a History and Physical was                         performed, and patient medications and allergies were                         reviewed. The patient's tolerance of previous                         anesthesia was also reviewed. The risks and benefits                         of the procedure and the sedation options and risks                         were discussed with the patient. All questions were                         answered, and informed consent was obtained. Prior                         Anticoagulants: The patient has taken no anticoagulant                         or antiplatelet agents. ASA Grade Assessment: II - A                         patient with mild systemic disease. After reviewing                         the risks and benefits, the patient was deemed in                         satisfactory condition to undergo the procedure.                        After obtaining informed consent, the scope was passed                         under direct vision. Throughout the procedure, the  patient's blood pressure, pulse, and oxygen                         saturations were monitored continuously. The                         Duodenoscope was introduced through the mouth, and                          used to inject contrast into and used to inject                         contrast into the bile duct. The ERCP was accomplished                         without difficulty. The patient tolerated the                         procedure well. Findings:      The scout film was normal. The esophagus was successfully intubated       under direct vision. The scope was advanced to a normal major papilla in       the descending duodenum without detailed examination of the pharynx,       larynx and associated structures, and upper GI tract. The upper GI tract       was grossly normal. The bile duct was deeply cannulated. Contrast was       injected. I personally interpreted the bile duct images. There was brisk       flow of contrast through the ducts. Image quality was excellent.       Contrast extended to the entire biliary tree. The main bile duct       contained multiple stones, the largest of which was 11 mm in diameter. A       wire was passed into the biliary tree. An 8 mm biliary sphincterotomy       was made with a traction (standard) sphincterotome using ERBE       electrocautery. There was no post-sphincterotomy bleeding. The biliary       tree was swept with a 15 mm balloon starting at the bifurcation. Sludge       was swept from the duct. Many stones were removed. No stones remained. Impression:            - Choledocholithiasis was found. Complete removal was                         accomplished by biliary sphincterotomy and balloon                         extraction.                        - A biliary sphincterotomy was performed.                        - The biliary tree was swept. Recommendation:        - Return patient to hospital ward for ongoing care.                        -  Clear liquid diet today.                        - Continue present medications.                        - Watch for pancreatitis, bleeding, perforation, and                         cholangitis. Procedure  Code(s):     --- Professional ---                        [phone], Endoscopic retrograde cholangiopancreatography                         (ERCP); with removal of calculi/debris from                         biliary/pancreatic duct(s)                        [id number], Endoscopic retrograde cholangiopancreatography                         (ERCP); with sphincterotomy/papillotomy                        [phone], Endoscopic catheterization of the biliary                         ductal system, radiological supervision and                         interpretation Diagnosis Code(s):     --- Professional ---                        K80.50, Calculus of bile duct without cholangitis or                         cholecystitis without obstruction CPT copyright 2022 American Medical Association. All rights reserved. The codes documented in this report are preliminary and upon coder review may  be revised to meet current compliance requirements. Midge Minium MD, MD 04/28/2023 12:04:48 PM This report has been signed electronically. Number of Addenda: 0 Note Initiated On: 04/28/2023 11:12 AM Estimated Blood Loss:  Estimated blood loss: none.      [facility]

## 2023-04-28 NOTE — Anesthesia Postprocedure Evaluation (Signed)
Anesthesia Post Note  Patient: Melanie Carrillo  Procedure(s) Performed: ENDOSCOPIC RETROGRADE CHOLANGIOPANCREATOGRAPHY (ERCP) WITH PROPOFOL SPHINCTEROTOMY REMOVAL OF STONES  Patient location during evaluation: Endoscopy Anesthesia Type: General Level of consciousness: awake and alert Pain management: pain level controlled Vital Signs Assessment: post-procedure vital signs reviewed and stable Respiratory status: spontaneous breathing, nonlabored ventilation, respiratory function stable and patient connected to nasal cannula oxygen Cardiovascular status: blood pressure returned to baseline and stable Postop Assessment: no apparent nausea or vomiting Anesthetic complications: no   No notable events documented.   Last Vitals:  Vitals:   04/28/23 1201 04/28/23 1211  BP: 103/82 115/82  Pulse: 98 90  Resp: (!) 25 (!) 24  Temp: (!) 36 C   SpO2: 98% 100%    Last Pain:  Vitals:   04/28/23 1221  TempSrc:   PainSc: 0-No pain                 Cleda Mccreedy Alexya Mcdaris

## 2023-04-28 NOTE — Transfer of Care (Signed)
Immediate Anesthesia Transfer of Care Note  Patient: Melanie Carrillo  Procedure(s) Performed: ENDOSCOPIC RETROGRADE CHOLANGIOPANCREATOGRAPHY (ERCP) WITH PROPOFOL SPHINCTEROTOMY REMOVAL OF STONES  Patient Location: PACU and Endoscopy Unit  Anesthesia Type:General  Level of Consciousness: awake  Airway & Oxygen Therapy: Patient Spontanous Breathing and Patient connected to face mask oxygen  Post-op Assessment: Report given to RN and Post -op Vital signs reviewed and stable  Post vital signs: Reviewed and stable  Last Vitals:  Vitals Value Taken Time  BP 103/82   Temp    Pulse 75   Resp 16   SpO2 99     Last Pain:  Vitals:   04/28/23 0948  TempSrc: Temporal  PainSc: 0-No pain         Complications: No notable events documented.

## 2023-04-28 NOTE — H&P (Signed)
Midge Minium, MD [facility] [address], [address] [place], [place] [address] Phone:[phone] Fax : [phone]  Primary Care Physician:  Ronnald Ramp, MD Primary Gastroenterologist:  Dr. Servando Snare  Pre-Procedure History & Physical: HPI:  Melanie Carrillo is a 88 y.o. female is here for an ERCP.   Past Medical History:  Diagnosis Date   Allergy    Anxiety    Arthritis    Cataract    Colon cancer (HCC)    Constipation    Dyspnea    GERD (gastroesophageal reflux disease)    Glaucoma    HLD (hyperlipidemia)    Hypertension    Obstructive sleep apnea 01/29/2014   No Cpap   Osteoporosis    Substance abuse (HCC)     Past Surgical History:  Procedure Laterality Date   APPENDECTOMY     CARDIAC CATHETERIZATION     COLON SURGERY     COLONOSCOPY     EYE SURGERY     cataracts   FEMUR IM NAIL Left 04/28/2022   Procedure: REVISION FIXATION OF LEFT FEMUR FRACTURE;  Surgeon: Roby Lofts, MD;  Location: [facility] OR;  Service: Orthopedics;  Laterality: Left;   FRACTURE SURGERY     HARDWARE REMOVAL Left 04/28/2022   Procedure: HARDWARE REMOVAL;  Surgeon: Roby Lofts, MD;  Location: [facility] OR;  Service: Orthopedics;  Laterality: Left;   HERNIA REPAIR     I & D EXTREMITY Left 06/23/2022   Procedure: IRRIGATION AND DEBRIDEMENT LEFT HIP;  Surgeon: Roby Lofts, MD;  Location: [facility] OR;  Service: Orthopedics;  Laterality: Left;   I & D EXTREMITY Left 07/28/2022   Procedure: IRRIGATION AND DEBRIDEMENT HIP;  Surgeon: Roby Lofts, MD;  Location: [facility] OR;  Service: Orthopedics;  Laterality: Left;   INTRAMEDULLARY (IM) NAIL INTERTROCHANTERIC Left 12/26/2021   Procedure: INTRAMEDULLARY (IM) NAIL INTERTROCHANTRIC;  Surgeon: Ross Marcus, MD;  Location: [facility] ORS;  Service: Orthopedics;  Laterality: Left;   MULTIPLE TOOTH EXTRACTIONS     full dentures   shoulder  surgery Left    TONSILLECTOMY     TUBAL LIGATION     UPPER GI ENDOSCOPY     WRIST SURGERY Right     Prior to Admission  medications   Medication Sig Start Date End Date Taking? Authorizing Provider  brimonidine (ALPHAGAN) 0.2 % ophthalmic solution Place 1 drop into both eyes in the morning and at bedtime. 12/15/21  Yes [provider]  latanoprost (XALATAN) 0.005 % ophthalmic solution Place 1 drop into both eyes at bedtime. 11/13/21  Yes [provider]  lovastatin (MEVACOR) 40 MG tablet Take 40 mg by mouth daily with supper. 11/10/21  Yes [provider]  Multiple Vitamins-Minerals (MULTIVITAMIN WITH MINERALS) tablet Take 1 tablet by mouth in the morning.   Yes [provider]  omeprazole (PRILOSEC) 40 MG capsule Take 40 mg by mouth daily before breakfast. 10/05/21  Yes [provider]  sertraline (ZOLOFT) 50 MG tablet TAKE 1 TABLET BY MOUTH EVERY DAY 11/23/22  Yes [name], Makiera, MD  simethicone (MYLICON) 80 MG chewable tablet Chew 80 mg by mouth daily in the afternoon.   Yes [provider]  traZODone (DESYREL) 50 MG tablet Take 0.5-1 tablets (25-50 mg total) by mouth at bedtime as needed. 02/28/23  Yes [name], Tawanna Cooler, MD  acetaminophen (TYLENOL) 325 MG tablet Take 1-2 tablets (325-650 mg total) by mouth every 6 (six) hours as needed for mild pain (pain score 1-3 or temp > 100.5). Patient taking differently: Take  650-1,300 mg by mouth as needed (pain). 12/31/21   Sunnie Nielsen, DO  Cholecalciferol (VITAMIN D-3) 125 MCG (5000 UT) TABS Take 5,000 Units by mouth in the morning.    [provider]  guaiFENesin (MUCINEX) 600 MG 12 hr tablet Take 600 mg by mouth in the morning and at bedtime. 05/06/22   [provider]  Melatonin 10 MG TABS Take 40 mg by mouth at bedtime as needed (sleep). Patient not taking: Reported on 04/28/2023    [provider]  montelukast (SINGULAIR) 10 MG tablet Take 10 mg by mouth at bedtime. Patient not taking: Reported on 04/28/2023 11/10/21   [provider]    Allergies as of 04/13/2023    (No Known Allergies)    Family History  Problem Relation Age of Onset   Hypertension Mother    Heart disease Mother    Heart disease Father     Social History   Socioeconomic History   Marital status: Widowed    Spouse name: Not on file   Number of children: Not on file   Years of education: Not on file   Highest education level: Not on file  Occupational History   Not on file  Tobacco Use   Smoking status: Never   Smokeless tobacco: Never  Vaping Use   Vaping status: Never Used  Substance and Sexual Activity   Alcohol use: Never   Drug use: Never   Sexual activity: Not Currently    Birth control/protection: Post-menopausal  Other Topics Concern   Not on file  Social History Narrative   Not on file   Social Determinants of Health   Financial Resource Strain: Low Risk  (11/23/2021)   Received from [facility], [facility]   Overall Financial Resource Strain (CARDIA)    Difficulty of Paying Living Expenses: Not hard at all  Food Insecurity: No Food Insecurity (06/24/2022)   Hunger Vital Sign    Worried About Running Out of Food in the Last Year: Never true    Ran Out of Food in the Last Year: Never true  Transportation Needs: No Transportation Needs (06/24/2022)   PRAPARE - Administrator, Civil Service (Medical): No    Lack of Transportation (Non-Medical): No  Physical Activity: Not on file  Stress: Not on file  Social Connections: Not on file  Intimate Partner Violence: Not At Risk (06/24/2022)   Humiliation, Afraid, Rape, and Kick questionnaire    Fear of Current or Ex-Partner: No    Emotionally Abused: No    Physically Abused: No    Sexually Abused: No    Review of Systems: See HPI, otherwise negative ROS  Physical Exam: BP (!) 150/72   Pulse 91   Temp (!) 96.6 F (35.9 C) (Temporal)   Resp 16   Ht 5\' 4"  (1.626 m)   Wt 67.3 kg   LMP  (LMP Unknown)   SpO2 98%   BMI 25.46 kg/m  General:   Alert,   pleasant and cooperative in NAD Head:  Normocephalic and atraumatic. Neck:  Supple; no masses or thyromegaly. Lungs:  Clear throughout to auscultation.    Heart:  Regular rate and rhythm. Abdomen:  Soft, nontender and nondistended. Normal bowel sounds, without guarding, and without rebound.   Neurologic:  Alert and  oriented x4;  grossly normal neurologically.  Impression/Plan: CELENIA PARAMO is here for an ERCP to be performed for cbd stones  Risks, benefits, limitations, and alternatives regarding  ERCP have been reviewed with the patient.  Questions have been answered.  All parties agreeable.   Midge Minium, MD  04/28/2023, 11:59 AM

## 2023-04-29 ENCOUNTER — Encounter: Payer: Self-pay | Admitting: Gastroenterology

## 2023-05-02 ENCOUNTER — Encounter: Payer: Self-pay | Admitting: Surgery

## 2023-05-02 ENCOUNTER — Encounter: Payer: Self-pay | Admitting: Family Medicine

## 2023-05-02 ENCOUNTER — Ambulatory Visit: Payer: Medicare HMO | Admitting: Surgery

## 2023-05-02 VITALS — BP 96/66 | HR 81 | Temp 98.2°F | Ht 64.0 in | Wt 147.0 lb

## 2023-05-02 DIAGNOSIS — K8044 Calculus of bile duct with chronic cholecystitis without obstruction: Secondary | ICD-10-CM

## 2023-05-02 NOTE — Telephone Encounter (Signed)
This was ordered by the ER department.

## 2023-05-02 NOTE — Patient Instructions (Signed)
You have requested to have your gallbladder removed. This will be done at [facility] with Dr. Pabon.  You will most likely be out of work 1-2 weeks for this surgery.  If you have FMLA or disability paperwork that needs filled out you may drop this off at our office or this can be faxed to [phone].  You will return after your post-op appointment with a lifting restriction for approximately 4 more weeks.  You will be able to eat anything you would like to following surgery. But, start by eating a bland diet and advance this as tolerated. The Gallbladder diet is below, please go as closely by this diet as possible prior to surgery to avoid any further attacks.  Please see the (blue)pre-care form that you have been given today. Our surgery scheduler will call you to verify surgery date and to go over information.   If you have any questions, please call our office.  Laparoscopic Cholecystectomy Laparoscopic cholecystectomy is surgery to remove the gallbladder. The gallbladder is located in the upper right part of the abdomen, behind the liver. It is a storage sac for bile, which is produced in the liver. Bile aids in the digestion and absorption of fats. Cholecystectomy is often done for inflammation of the gallbladder (cholecystitis). This condition is usually caused by a buildup of gallstones (cholelithiasis) in the gallbladder. Gallstones can block the flow of bile, and that can result in inflammation and pain. In severe cases, emergency surgery may be required. If emergency surgery is not required, you will have time to prepare for the procedure. Laparoscopic surgery is an alternative to open surgery. Laparoscopic surgery has a shorter recovery time. Your common bile duct may also need to be examined during the procedure. If stones are found in the common bile duct, they may be removed. LET YOUR HEALTH CARE PROVIDER KNOW ABOUT: Any allergies you have. All medicines you are taking,  including vitamins, herbs, eye drops, creams, and over-the-counter medicines. Previous problems you or members of your family have had with the use of anesthetics. Any blood disorders you have. Previous surgeries you have had.  Any medical conditions you have. RISKS AND COMPLICATIONS Generally, this is a safe procedure. However, problems may occur, including: Infection. Bleeding. Allergic reactions to medicines. Damage to other structures or organs. A stone remaining in the common bile duct. A bile leak from the cyst duct that is clipped when your gallbladder is removed. The need to convert to open surgery, which requires a larger incision in the abdomen. This may be necessary if your surgeon thinks that it is not safe to continue with a laparoscopic procedure. BEFORE THE PROCEDURE Ask your health care provider about: Changing or stopping your regular medicines. This is especially important if you are taking diabetes medicines or blood thinners. Taking medicines such as aspirin and ibuprofen. These medicines can thin your blood. Do not take these medicines before your procedure if your health care provider instructs you not to. Follow instructions from your health care provider about eating or drinking restrictions. Let your health care provider know if you develop a cold or an infection before surgery. Plan to have someone take you home after the procedure. Ask your health care provider how your surgical site will be marked or identified. You may be given antibiotic medicine to help prevent infection. PROCEDURE To reduce your risk of infection: Your health care team will wash or sanitize their hands. Your skin will be washed with soap. An IV   tube may be inserted into one of your veins. You will be given a medicine to make you fall asleep (general anesthetic). A breathing tube will be placed in your mouth. The surgeon will make several small cuts (incisions) in your abdomen. A thin,  lighted tube (laparoscope) that has a tiny camera on the end will be inserted through one of the small incisions. The camera on the laparoscope will send a picture to a TV screen (monitor) in the operating room. This will give the surgeon a good view inside your abdomen. A gas will be pumped into your abdomen. This will expand your abdomen to give the surgeon more room to perform the surgery. Other tools that are needed for the procedure will be inserted through the other incisions. The gallbladder will be removed through one of the incisions. After your gallbladder has been removed, the incisions will be closed with stitches (sutures), staples, or skin glue. Your incisions may be covered with a bandage (dressing). The procedure may vary among health care providers and hospitals. AFTER THE PROCEDURE Your blood pressure, heart rate, breathing rate, and blood oxygen level will be monitored often until the medicines you were given have worn off. You will be given medicines as needed to control your pain.   This information is not intended to replace advice given to you by your health care provider. Make sure you discuss any questions you have with your health care provider.   Document Released: 08/09/2005 Document Revised: 04/30/2015 Document Reviewed: 03/21/2013 Elsevier Interactive Patient Education 2016 Elsevier Inc.   Low-Fat Diet for Gallbladder Conditions A low-fat diet can be helpful if you have pancreatitis or a gallbladder condition. With these conditions, your pancreas and gallbladder have trouble digesting fats. A healthy eating plan with less fat will help rest your pancreas and gallbladder and reduce your symptoms. WHAT DO I NEED TO KNOW ABOUT THIS DIET? Eat a low-fat diet. Reduce your fat intake to less than 20-30% of your total daily calories. This is less than 50-60 g of fat per day. Remember that you need some fat in your diet. Ask your dietician what your daily goal should  be. Choose nonfat and low-fat healthy foods. Look for the words "nonfat," "low fat," or "fat free." As a guide, look on the label and choose foods with less than 3 g of fat per serving. Eat only one serving. Avoid alcohol. Do not smoke. If you need help quitting, talk with your health care provider. Eat small frequent meals instead of three large heavy meals. WHAT FOODS CAN I EAT? Grains Include healthy grains and starches such as potatoes, wheat bread, fiber-rich cereal, and brown rice. Choose whole grain options whenever possible. In adults, whole grains should account for 45-65% of your daily calories.  Fruits and Vegetables Eat plenty of fruits and vegetables. Fresh fruits and vegetables add fiber to your diet. Meats and Other Protein Sources Eat lean meat such as chicken and pork. Trim any fat off of meat before cooking it. Eggs, fish, and beans are other sources of protein. In adults, these foods should account for 10-35% of your daily calories. Dairy Choose low-fat milk and dairy options. Dairy includes fat and protein, as well as calcium.  Fats and Oils Limit high-fat foods such as fried foods, sweets, baked goods, sugary drinks.  Other Creamy sauces and condiments, such as mayonnaise, can add extra fat. Think about whether or not you need to use them, or use smaller amounts or low fat options.   WHAT FOODS ARE NOT RECOMMENDED? High fat foods, such as: Baked goods. Ice cream. French toast. Sweet rolls. Pizza. Cheese bread. Foods covered with batter, butter, creamy sauces, or cheese. Fried foods. Sugary drinks and desserts. Foods that cause gas or bloating   This information is not intended to replace advice given to you by your health care provider. Make sure you discuss any questions you have with your health care provider.   Document Released: 08/14/2013 Document Reviewed: 08/14/2013 Elsevier Interactive Patient Education 2016 Elsevier Inc.   

## 2023-05-02 NOTE — Group Note (Deleted)

## 2023-05-03 ENCOUNTER — Telehealth: Payer: Self-pay | Admitting: Surgery

## 2023-05-03 MED ORDER — MONTELUKAST SODIUM 10 MG PO TABS: 10.0000 mg | ORAL_TABLET | Freq: Every day | ORAL | 3 refills | Status: DC

## 2023-05-03 NOTE — Telephone Encounter (Signed)
Patient has been advised of Pre-Admission date/time, and Surgery date at [facility].  Surgery Date: 05/12/23 Preadmission Testing Date: 05/05/23 (phone 8a-1p)  Patient has been made aware to call [phone], between 1-3:00pm the day before surgery, to find out what time to arrive for surgery.

## 2023-05-04 ENCOUNTER — Encounter: Payer: Self-pay | Admitting: Surgery

## 2023-05-04 NOTE — Progress Notes (Signed)
Surgical Consultation  05/04/2023  Melanie Carrillo is an 88 y.o. female.   Chief Complaint  Patient presents with   Follow-up     HPI:  Melanie Carrillo is a 88 y.o. female seen in F.u for biliary colic and CBD stones.  Completed ERCP with good results.  Still Has some abdominal pain that is diffuse, mild to moderate, dull  intermittent/ . Personally reviewed the ERCP images showing evidence of choledocholithiasis.  The stone was removed.  Sphincterotomy performed . last year she did have a fall and fractured her left hip.  She has since then had a prolonged rocky recovery since she did have an infection of the left hip hardware needed to be removed.  Initially operated by Dr. Okey Dupre here at [facility] and then had to be transferred to [facility] for trauma orthopedic surgeons Dr. Jena Gauss did multiple irrigation and debridements and removal of hardware.  He is starting to recover from this.  She uses a walker.  Her mind is pristine.  She Comes with her daughter who is Charity fundraiser.  SHe does have a significant history of colon cancer that required colectomy.  Apparently complicated by evisceration and hernia repair that subsequently required mesh removal and/replacement.  She has had at least 3 major abdominal operations.  The daughter tells me that this was a prolonged hospital stay decades ago. Prior appendectomy and hysterectomy. I do not have access to any of the operative reports.  Apparently was done in an outside hospital. She did have a hx of chronic pain. Hx interstitial lung dz.  Past Medical History:  Diagnosis Date   Allergy    Anxiety    Arthritis    Cataract    Colon cancer (HCC)    Constipation    Dyspnea    GERD (gastroesophageal reflux disease)    Glaucoma    HLD (hyperlipidemia)    Hypertension    Obstructive sleep apnea 01/29/2014   No Cpap   Osteoporosis    Substance abuse (HCC)     Past Surgical History:  Procedure Laterality Date   APPENDECTOMY     CARDIAC  CATHETERIZATION     COLON SURGERY     COLONOSCOPY     ENDOSCOPIC RETROGRADE CHOLANGIOPANCREATOGRAPHY (ERCP) WITH PROPOFOL N/A 04/28/2023   Procedure: ENDOSCOPIC RETROGRADE CHOLANGIOPANCREATOGRAPHY (ERCP) WITH PROPOFOL;  Surgeon: Midge Minium, MD;  Location: [facility] ENDOSCOPY;  Service: Endoscopy;  Laterality: N/A;   EYE SURGERY     cataracts   FEMUR IM NAIL Left 04/28/2022   Procedure: REVISION FIXATION OF LEFT FEMUR FRACTURE;  Surgeon: Roby Lofts, MD;  Location: [facility] OR;  Service: Orthopedics;  Laterality: Left;   FRACTURE SURGERY     HARDWARE REMOVAL Left 04/28/2022   Procedure: HARDWARE REMOVAL;  Surgeon: Roby Lofts, MD;  Location: [facility] OR;  Service: Orthopedics;  Laterality: Left;   HERNIA REPAIR     I & D EXTREMITY Left 06/23/2022   Procedure: IRRIGATION AND DEBRIDEMENT LEFT HIP;  Surgeon: Roby Lofts, MD;  Location: [facility] OR;  Service: Orthopedics;  Laterality: Left;   I & D EXTREMITY Left 07/28/2022   Procedure: IRRIGATION AND DEBRIDEMENT HIP;  Surgeon: Roby Lofts, MD;  Location: [facility] OR;  Service: Orthopedics;  Laterality: Left;   INTRAMEDULLARY (IM) NAIL INTERTROCHANTERIC Left 12/26/2021   Procedure: INTRAMEDULLARY (IM) NAIL INTERTROCHANTRIC;  Surgeon: Ross Marcus, MD;  Location: [facility] ORS;  Service: Orthopedics;  Laterality: Left;   MULTIPLE TOOTH EXTRACTIONS     full dentures  REMOVAL OF STONES  04/28/2023   Procedure: REMOVAL OF STONES;  Surgeon: Midge Minium, MD;  Location: [facility] ENDOSCOPY;  Service: Endoscopy;;   shoulder  surgery Left    SPHINCTEROTOMY  04/28/2023   Procedure: SPHINCTEROTOMY;  Surgeon: Midge Minium, MD;  Location: [facility] ENDOSCOPY;  Service: Endoscopy;;   TONSILLECTOMY     TUBAL LIGATION     UPPER GI ENDOSCOPY     WRIST SURGERY Right     Family History  Problem Relation Age of Onset   Hypertension Mother    Heart disease Mother    Heart disease Father     Social History:  reports that she has never smoked. She has never been exposed to tobacco  smoke. She has never used smokeless tobacco. She reports that she does not drink alcohol and does not use drugs.  Allergies: No Known Allergies  Medications reviewed.     ROS Full ROS performed and is otherwise negative other than what is stated in the HPI    BP 96/66   Pulse 81   Temp 98.2 F (36.8 C)   Ht 5\' 4"  (1.626 m)   Wt 147 lb (66.7 kg)   LMP  (LMP Unknown)   SpO2 97%   BMI 25.23 kg/m   Physical Exam CONSTITUTIONAL: NAD. EYES: Pupils are equal, round, Sclera are non-icteric. EARS, NOSE, MOUTH AND THROAT: The oropharynx is clear. The oral mucosa is pink and moist. Hearing is intact to voice. LYMPH NODES:  Lymph nodes in the neck are normal. RESPIRATORY:  Lungs are clear. There is normal respiratory effort, with equal breath sounds bilaterally, and without pathologic use of accessory muscles. CARDIOVASCULAR: Heart is regular without murmurs, gallops, or rubs. GI: Midline generous laparotomy scar from pubis to xiphoid. The abdomen is soft, nontender, and nondistended. There are no palpable masses. There is no hepatosplenomegaly. There are normal bowel sounds  GU: Rectal deferred.   MUSCULOSKELETAL: Normal muscle strength and tone. No cyanosis or edema.   SKIN: Turgor is good and there are no pathologic skin lesions or ulcers. NEUROLOGIC: Motor and sensation is grossly normal. Cranial nerves are grossly intact. PSYCH:  Oriented to person, place and time. Affect is normal.  Assessment/Plan: 88 year old female with multiple abdominal operations  S/P ERCP w/o complications.  cholecystectomy is indicated. I am Concern about her intra-abdominal lesions and the hostility of her abdominal cavity.  I might entertain doing a Veress needle and do a diagnostic laparoscopy first and if there is no window I would not hesitate to do an old school fashion open cholecystectomy.  I Had an extensive discussion with the patient and the daughter who is an Charity fundraiser.  They are fully aware of the  situation. We will plan on initial diagnostic laparoscopy to gauge the intra-abdominal anatomy, if feasible proceed w Robotic cholecystectomy if not she will require open cholecystectomy. Currently she is not toxic not septic not peritonitic and does not need emergent surgical intervention at this time.  Please note that I spent greater than 40 minutes in this encounter including personally reviewing medical records, counseling the patient and family, placing orders and performing documentation  Sterling Big, MD FACS

## 2023-05-04 NOTE — H&P (View-Only) (Signed)
Surgical Consultation  05/04/2023  Melanie Carrillo is an 88 y.o. female.   Chief Complaint  Patient presents with   Follow-up     HPI:  Melanie Carrillo is a 88 y.o. female seen in F.u for biliary colic and CBD stones.  Completed ERCP with good results.  Still Has some abdominal pain that is diffuse, mild to moderate, dull  intermittent/ . Personally reviewed the ERCP images showing evidence of choledocholithiasis.  The stone was removed.  Sphincterotomy performed . last year she did have a fall and fractured her left hip.  She has since then had a prolonged rocky recovery since she did have an infection of the left hip hardware needed to be removed.  Initially operated by Dr. Okey Dupre here at [facility] and then had to be transferred to [facility] for trauma orthopedic surgeons Dr. Jena Gauss did multiple irrigation and debridements and removal of hardware.  He is starting to recover from this.  She uses a walker.  Her mind is pristine.  She Comes with her daughter who is Charity fundraiser.  SHe does have a significant history of colon cancer that required colectomy.  Apparently complicated by evisceration and hernia repair that subsequently required mesh removal and/replacement.  She has had at least 3 major abdominal operations.  The daughter tells me that this was a prolonged hospital stay decades ago. Prior appendectomy and hysterectomy. I do not have access to any of the operative reports.  Apparently was done in an outside hospital. She did have a hx of chronic pain. Hx interstitial lung dz.  Past Medical History:  Diagnosis Date   Allergy    Anxiety    Arthritis    Cataract    Colon cancer (HCC)    Constipation    Dyspnea    GERD (gastroesophageal reflux disease)    Glaucoma    HLD (hyperlipidemia)    Hypertension    Obstructive sleep apnea 01/29/2014   No Cpap   Osteoporosis    Substance abuse (HCC)     Past Surgical History:  Procedure Laterality Date   APPENDECTOMY     CARDIAC  CATHETERIZATION     COLON SURGERY     COLONOSCOPY     ENDOSCOPIC RETROGRADE CHOLANGIOPANCREATOGRAPHY (ERCP) WITH PROPOFOL N/A 04/28/2023   Procedure: ENDOSCOPIC RETROGRADE CHOLANGIOPANCREATOGRAPHY (ERCP) WITH PROPOFOL;  Surgeon: Midge Minium, MD;  Location: [facility] ENDOSCOPY;  Service: Endoscopy;  Laterality: N/A;   EYE SURGERY     cataracts   FEMUR IM NAIL Left 04/28/2022   Procedure: REVISION FIXATION OF LEFT FEMUR FRACTURE;  Surgeon: Roby Lofts, MD;  [facility];  Service: Orthopedics;  Laterality: Left;   FRACTURE SURGERY     HARDWARE REMOVAL Left 04/28/2022   Procedure: HARDWARE REMOVAL;  Surgeon: Roby Lofts, MD;  Location: [facility];  Service: Orthopedics;  Laterality: Left;   HERNIA REPAIR     I & D EXTREMITY Left 06/23/2022   Procedure: IRRIGATION AND DEBRIDEMENT LEFT HIP;  Surgeon: Roby Lofts, MD;  Location: [facility];  Service: Orthopedics;  Laterality: Left;   I & D EXTREMITY Left 07/28/2022   Procedure: IRRIGATION AND DEBRIDEMENT HIP;  Surgeon: Roby Lofts, MD;  Location: [facility];  Service: Orthopedics;  Laterality: Left;   INTRAMEDULLARY (IM) NAIL INTERTROCHANTERIC Left 12/26/2021   Procedure: INTRAMEDULLARY (IM) NAIL INTERTROCHANTRIC;  Surgeon: Ross Marcus, MD;  Location: [facility] ORS;  Service: Orthopedics;  Laterality: Left;   MULTIPLE TOOTH EXTRACTIONS     full dentures  REMOVAL OF STONES  04/28/2023   Procedure: REMOVAL OF STONES;  Surgeon: Midge Minium, MD;  Location: [facility] ENDOS[facility];  Service: Endoscopy;;   shoulder  surgery Left    SPHINCTEROTOMY  04/28/2023   Procedure: SPHINCTEROTOMY;  Surgeon: Midge Minium, MD;  Location: [facility] ENDOS[facility];  Service: Endoscopy;;   TONSILLECTOMY     TUBAL LIGATION     UPPER GI ENDOSCOPY     WRIST SURGERY Right     Family History  Problem Relation Age of Onset   Hypertension Mother    Heart disease Mother    Heart disease Father     Social History:  reports that she has never smoked. She has never been exposed to tobacco  smoke. She has never used smokeless tobacco. She reports that she does not drink alcohol and does not use drugs.  Allergies: No Known Allergies  Medications reviewed.     ROS Full ROS performed and is otherwise negative other than what is stated in the HPI    BP 96/66   Pulse 81   Temp 98.2 F (36.8 C)   Ht 5\' 4"  (1.626 m)   Wt 147 lb (66.7 kg)   LMP  (LMP Unknown)   SpO2 97%   BMI 25.23 kg/m   Physical Exam CONSTITUTIONAL: NAD. EYES: Pupils are equal, round, Sclera are non-icteric. EARS, NOSE, MOUTH AND THROAT: The oropharynx is clear. The oral mucosa is pink and moist. Hearing is intact to voice. LYMPH NODES:  Lymph nodes in the neck are normal. RESPIRATORY:  Lungs are clear. There is normal respiratory effort, with equal breath sounds bilaterally, and without pathologic use of accessory muscles. CARDIOVASCULAR: Heart is regular without murmurs, gallops, or rubs. GI: Midline generous laparotomy scar from pubis to xiphoid. The abdomen is soft, nontender, and nondistended. There are no palpable masses. There is no hepatosplenomegaly. There are normal bowel sounds  GU: Rectal deferred.   MUSCULOSKELETAL: Normal muscle strength and tone. No cyanosis or edema.   SKIN: Turgor is good and there are no pathologic skin lesions or ulcers. NEUROLOGIC: Motor and sensation is grossly normal. Cranial nerves are grossly intact. PSYCH:  Oriented to person, place and time. Affect is normal.  Assessment/Plan: 88 year old female with multiple abdominal operations  S/P ERCP w/o complications.  cholecystectomy is indicated. I am Concern about her intra-abdominal lesions and the hostility of her abdominal cavity.  I might entertain doing a Veress needle and do a diagnostic laparoscopy first and if there is no window I would not hesitate to do an old school fashion open cholecystectomy.  I Had an extensive discussion with the patient and the daughter who is an Charity fundraiser.  They are fully aware of the  situation. We will plan on initial diagnostic laparoscopy to gauge the intra-abdominal anatomy, if feasible proceed w Robotic cholecystectomy if not she will require open cholecystectomy. Currently she is not toxic not septic not peritonitic and does not need emergent surgical intervention at this time.  Please note that I spent greater than 40 minutes in this encounter including personally reviewing medical records, counseling the patient and family, placing orders and performing documentation  Sterling Big, MD FACS

## 2023-05-05 ENCOUNTER — Encounter
Admission: RE | Admit: 2023-05-05 | Discharge: 2023-05-05 | Disposition: A | Discharge status: Home / Self Care | Payer: Medicare HMO | Source: Ambulatory Visit | Attending: Surgery | Admitting: Surgery

## 2023-05-05 DIAGNOSIS — I10 Essential (primary) hypertension: Secondary | ICD-10-CM

## 2023-05-05 DIAGNOSIS — Z01818 Encounter for other preprocedural examination: Secondary | ICD-10-CM

## 2023-05-05 DIAGNOSIS — Z01810 Encounter for preprocedural cardiovascular examination: Secondary | ICD-10-CM

## 2023-05-05 HISTORY — DX: Interstitial pulmonary disease, unspecified: J84.9

## 2023-05-05 HISTORY — DX: Depression, unspecified: F32.A

## 2023-05-05 HISTORY — DX: Rhabdomyolysis: M62.82

## 2023-05-05 HISTORY — DX: Other intervertebral disc degeneration, lumbar region without mention of lumbar back pain or lower extremity pain: M51.369

## 2023-05-05 HISTORY — DX: Other intervertebral disc degeneration, lumbar region: M51.36

## 2023-05-05 HISTORY — DX: Zoster without complications: B02.9

## 2023-05-05 HISTORY — DX: Calculus of gallbladder and bile duct without cholecystitis with obstruction: K80.71

## 2023-05-05 HISTORY — DX: Infection following a procedure, other surgical site, initial encounter: T81.49XA

## 2023-05-05 HISTORY — DX: Calculus of bile duct without cholangitis or cholecystitis without obstruction: K80.50

## 2023-05-05 HISTORY — DX: Osteomyelitis, unspecified: M86.9

## 2023-05-05 NOTE — Patient Instructions (Signed)
Your procedure is scheduled on:05-12-23 Thursday Report to the Registration Desk on the 1st floor of the Medical Mall.Then proceed to the 2nd floor Surgery Desk To find out your arrival time, please call [phone] between 1PM - 3PM on:05-11-23 Wednesday If your arrival time is 6:00 am, do not arrive before that time as the Medical Mall entrance doors do not open until 6:00 am.  REMEMBER: Instructions that are not followed completely may result in serious medical risk, up to and including death; or upon the discretion of your surgeon and anesthesiologist your surgery may need to be rescheduled.  Do not eat food after midnight the night before surgery.  No gum chewing or hard candies.  You may however, drink CLEAR liquids up to 2 hours before you are scheduled to arrive for your surgery. Do not drink anything within 2 hours of your scheduled arrival time.  Clear liquids include: - water  - apple juice without pulp - gatorade (not RED colors) - black coffee or tea (Do NOT add milk or creamers to the coffee or tea) Do NOT drink anything that is not on this list.  One week prior to surgery: Stop Anti-inflammatories (NSAIDS) such as Advil, Aleve, Ibuprofen, Motrin, Naproxen, Naprosyn and Aspirin based products such as Excedrin, Goody's Powder, BC Powder.You may however, take Tylenol if needed for pain up until the day of surgery. Stop ANY OVER THE COUNTER supplements/vitamins NOW (05-05-23) until after surgery (Multivitamin and Vitamin D3)  Continue taking all prescribed medications    TAKE ONLY THESE MEDICATIONS THE MORNING OF SURGERY WITH A SIP OF WATER: -omeprazole (PRILOSEC)  -sertraline (ZOLOFT)   No Alcohol for 24 hours before or after surgery.  No Smoking including e-cigarettes for 24 hours before surgery.  No chewable tobacco products for at least 6 hours before surgery.  No nicotine patches on the day of surgery.  Do not use any "recreational" drugs for at least a week  (preferably 2 weeks) before your surgery.  Please be advised that the combination of cocaine and anesthesia may have negative outcomes, up to and including death. If you test positive for cocaine, your surgery will be cancelled.  On the morning of surgery brush your teeth with toothpaste and water, you may rinse your mouth with mouthwash if you wish. Do not swallow any toothpaste or mouthwash.  Use CHG Soap as directed on instruction sheet.  Do not wear jewelry, make-up, hairpins, clips or nail polish.  For welded (permanent) jewelry: bracelets, anklets, waist bands, etc.  Please have this removed prior to surgery.  If it is not removed, there is a chance that hospital personnel will need to cut it off on the day of surgery.  Do not wear lotions, powders, or perfumes.   Do not shave body hair from the neck down 48 hours before surgery.  Contact lenses, hearing aids and dentures may not be worn into surgery.  Do not bring valuables to the hospital. [facility] is not responsible for any missing/lost belongings or valuables.   Notify your doctor if there is any change in your medical condition (cold, fever, infection).  Wear comfortable clothing (specific to your surgery type) to the hospital.  After surgery, you can help prevent lung complications by doing breathing exercises.  Take deep breaths and cough every 1-2 hours. Your doctor may order a device called an Incentive Spirometer to help you take deep breaths. When coughing or sneezing, hold a pillow firmly against your incision with both hands. This  is called "splinting." Doing this helps protect your incision. It also decreases belly discomfort.  If you are being admitted to the hospital overnight, leave your suitcase in the car. After surgery it may be brought to your room.  In case of increased patient census, it may be necessary for you, the patient, to continue your postoperative care in the Same Day Surgery department.  If  you are being discharged the day of surgery, you will not be allowed to drive home. You will need a responsible individual to drive you home and stay with you for 24 hours after surgery.   If you are taking public transportation, you will need to have a responsible individual with you.  Please call the Pre-admissions Testing Dept. at [phone] if you have any questions about these instructions.  Surgery Visitation Policy:  Patients having surgery or a procedure may have two visitors.  Children under the age of 72 must have an adult with them who is not the patient.     Preparing for Surgery with CHLORHEXIDINE GLUCONATE (CHG) Soap  Chlorhexidine Gluconate (CHG) Soap  o An antiseptic cleaner that kills germs and bonds with the skin to continue killing germs even after washing  o Used for showering the night before surgery and morning of surgery  Before surgery, you can play an important role by reducing the number of germs on your skin.  CHG (Chlorhexidine gluconate) soap is an antiseptic cleanser which kills germs and bonds with the skin to continue killing germs even after washing.  Please do not use if you have an allergy to CHG or antibacterial soaps. If your skin becomes reddened/irritated stop using the CHG.  1. Shower the NIGHT BEFORE SURGERY and the MORNING OF SURGERY with CHG soap.  2. If you choose to wash your hair, wash your hair first as usual with your normal shampoo.  3. After shampooing, rinse your hair and body thoroughly to remove the shampoo.  4. Use CHG as you would any other liquid soap. You can apply CHG directly to the skin and wash gently with a scrungie or a clean washcloth.  5. Apply the CHG soap to your body only from the neck down. Do not use on open wounds or open sores. Avoid contact with your eyes, ears, mouth, and genitals (private parts). Wash face and genitals (private parts) with your normal soap.  6. Wash thoroughly, paying special  attention to the area where your surgery will be performed.  7. Thoroughly rinse your body with warm water.  8. Do not shower/wash with your normal soap after using and rinsing off the CHG soap.  9. Pat yourself dry with a clean towel.  10. Wear clean pajamas to bed the night before surgery.  12. Place clean sheets on your bed the night of your first shower and do not sleep with pets.  13. Shower again with the CHG soap on the day of surgery prior to arriving at the hospital.  14. Do not apply any deodorants/lotions/powders.  15. Please wear clean clothes to the hospital.

## 2023-05-09 ENCOUNTER — Encounter
Admission: RE | Admit: 2023-05-09 | Discharge: 2023-05-09 | Disposition: A | Discharge status: Home / Self Care | Payer: Medicare HMO | Source: Ambulatory Visit | Attending: Surgery | Admitting: Surgery

## 2023-05-09 DIAGNOSIS — I10 Essential (primary) hypertension: Secondary | ICD-10-CM | POA: Diagnosis not present

## 2023-05-09 DIAGNOSIS — Z01810 Encounter for preprocedural cardiovascular examination: Secondary | ICD-10-CM | POA: Diagnosis not present

## 2023-05-12 ENCOUNTER — Encounter: Payer: Self-pay | Admitting: Surgery

## 2023-05-12 ENCOUNTER — Ambulatory Visit (HOSPITAL_BASED_OUTPATIENT_CLINIC_OR_DEPARTMENT_OTHER)
Admission: RE | Admit: 2023-05-12 | Discharge: 2023-05-12 | Disposition: A | Discharge status: Home / Self Care | Payer: Medicare HMO | Source: Home / Self Care | Attending: Surgery | Admitting: Surgery

## 2023-05-12 ENCOUNTER — Encounter
Admission: RE | Disposition: A | Discharge status: Home / Self Care | Payer: Self-pay | Source: Home / Self Care | Attending: Surgery

## 2023-05-12 ENCOUNTER — Ambulatory Visit: Payer: Medicare HMO | Admitting: Urgent Care

## 2023-05-12 ENCOUNTER — Other Ambulatory Visit: Payer: Self-pay

## 2023-05-12 DIAGNOSIS — Z8249 Family history of ischemic heart disease and other diseases of the circulatory system: Secondary | ICD-10-CM | POA: Insufficient documentation

## 2023-05-12 DIAGNOSIS — Z9049 Acquired absence of other specified parts of digestive tract: Secondary | ICD-10-CM | POA: Insufficient documentation

## 2023-05-12 DIAGNOSIS — Z85038 Personal history of other malignant neoplasm of large intestine: Secondary | ICD-10-CM | POA: Insufficient documentation

## 2023-05-12 DIAGNOSIS — I10 Essential (primary) hypertension: Secondary | ICD-10-CM | POA: Insufficient documentation

## 2023-05-12 DIAGNOSIS — T8144XA Sepsis following a procedure, initial encounter: Secondary | ICD-10-CM | POA: Diagnosis not present

## 2023-05-12 DIAGNOSIS — R000 Tachycardia, unspecified: Secondary | ICD-10-CM | POA: Diagnosis not present

## 2023-05-12 DIAGNOSIS — A419 Sepsis, unspecified organism: Secondary | ICD-10-CM | POA: Diagnosis not present

## 2023-05-12 DIAGNOSIS — K812 Acute cholecystitis with chronic cholecystitis: Secondary | ICD-10-CM | POA: Diagnosis not present

## 2023-05-12 DIAGNOSIS — K8044 Calculus of bile duct with chronic cholecystitis without obstruction: Secondary | ICD-10-CM | POA: Insufficient documentation

## 2023-05-12 DIAGNOSIS — K8050 Calculus of bile duct without cholangitis or cholecystitis without obstruction: Secondary | ICD-10-CM | POA: Diagnosis not present

## 2023-05-12 SURGERY — CHOLECYSTECTOMY, ROBOT-ASSISTED, LAPAROSCOPIC
Anesthesia: General | Site: Abdomen

## 2023-05-12 MED ORDER — 0.9 % SODIUM CHLORIDE (POUR BTL) OPTIME
TOPICAL | Status: DC | PRN
  Administered 2023-05-12: 500 mL

## 2023-05-12 MED ORDER — LACTATED RINGERS IV SOLN: INTRAVENOUS | Status: DC

## 2023-05-12 MED ORDER — GABAPENTIN 300 MG PO CAPS
ORAL_CAPSULE | ORAL | Status: AC
  Filled 2023-05-12: qty 1

## 2023-05-12 MED ORDER — ROCURONIUM BROMIDE 10 MG/ML (PF) SYRINGE
PREFILLED_SYRINGE | INTRAVENOUS | Status: AC
  Filled 2023-05-12: qty 10

## 2023-05-12 MED ORDER — CHLORHEXIDINE GLUCONATE CLOTH 2 % EX PADS: 6.0000 | MEDICATED_PAD | Freq: Once | CUTANEOUS | Status: DC

## 2023-05-12 MED ORDER — EPHEDRINE 5 MG/ML INJ
INTRAVENOUS | Status: AC
  Filled 2023-05-12: qty 5

## 2023-05-12 MED ORDER — FENTANYL CITRATE (PF) 100 MCG/2ML IJ SOLN
25.0000 ug | INTRAMUSCULAR | Status: DC | PRN
  Administered 2023-05-12 (×2): 25 ug via INTRAVENOUS

## 2023-05-12 MED ORDER — KETOROLAC TROMETHAMINE 30 MG/ML IJ SOLN
INTRAMUSCULAR | Status: AC
  Filled 2023-05-12: qty 1

## 2023-05-12 MED ORDER — DROPERIDOL 2.5 MG/ML IJ SOLN
0.6250 mg | Freq: Once | INTRAMUSCULAR | Status: AC
  Administered 2023-05-12: 0.625 mg via INTRAVENOUS

## 2023-05-12 MED ORDER — OXYCODONE HCL 5 MG PO TABS
5.0000 mg | ORAL_TABLET | Freq: Once | ORAL | Status: AC | PRN
  Administered 2023-05-12: 5 mg via ORAL

## 2023-05-12 MED ORDER — ONDANSETRON HCL 4 MG/2ML IJ SOLN
INTRAMUSCULAR | Status: AC
  Filled 2023-05-12: qty 2

## 2023-05-12 MED ORDER — LABETALOL HCL 5 MG/ML IV SOLN
INTRAVENOUS | Status: AC
  Filled 2023-05-12: qty 4

## 2023-05-12 MED ORDER — ESMOLOL HCL 100 MG/10ML IV SOLN
INTRAVENOUS | Status: DC | PRN
Start: 2023-05-12 — End: 2023-05-12
  Administered 2023-05-12: 30 mg via INTRAVENOUS

## 2023-05-12 MED ORDER — FENTANYL CITRATE (PF) 100 MCG/2ML IJ SOLN
INTRAMUSCULAR | Status: AC
  Filled 2023-05-12: qty 2

## 2023-05-12 MED ORDER — PROPOFOL 10 MG/ML IV BOLUS
INTRAVENOUS | Status: DC | PRN
  Administered 2023-05-12: 80 mg via INTRAVENOUS

## 2023-05-12 MED ORDER — ORAL CARE MOUTH RINSE: 15.0000 mL | Freq: Once | OROMUCOSAL | Status: AC

## 2023-05-12 MED ORDER — ESMOLOL HCL 100 MG/10ML IV SOLN
INTRAVENOUS | Status: AC
  Filled 2023-05-12: qty 10

## 2023-05-12 MED ORDER — DEXAMETHASONE SODIUM PHOSPHATE 10 MG/ML IJ SOLN
INTRAMUSCULAR | Status: AC
  Filled 2023-05-12: qty 1

## 2023-05-12 MED ORDER — INDOCYANINE GREEN 25 MG IV SOLR
2.5000 mg | Freq: Once | INTRAVENOUS | Status: AC
  Administered 2023-05-12: 2.5 mg via INTRAVENOUS

## 2023-05-12 MED ORDER — DROPERIDOL 2.5 MG/ML IJ SOLN
INTRAMUSCULAR | Status: AC
  Filled 2023-05-12: qty 2

## 2023-05-12 MED ORDER — DEXAMETHASONE SODIUM PHOSPHATE 10 MG/ML IJ SOLN
INTRAMUSCULAR | Status: DC | PRN
  Administered 2023-05-12: 10 mg via INTRAVENOUS

## 2023-05-12 MED ORDER — BUPIVACAINE-EPINEPHRINE (PF) 0.25% -1:200000 IJ SOLN
INTRAMUSCULAR | Status: AC
  Filled 2023-05-12: qty 30

## 2023-05-12 MED ORDER — AMOXICILLIN-POT CLAVULANATE 875-125 MG PO TABS: 1.0000 | ORAL_TABLET | Freq: Two times a day (BID) | ORAL | 0 refills | Status: DC

## 2023-05-12 MED ORDER — DEXAMETHASONE SODIUM PHOSPHATE 10 MG/ML IJ SOLN: INTRAMUSCULAR | Status: DC | PRN

## 2023-05-12 MED ORDER — ACETAMINOPHEN 500 MG PO TABS
1000.0000 mg | ORAL_TABLET | ORAL | Status: AC
  Administered 2023-05-12: 1000 mg via ORAL

## 2023-05-12 MED ORDER — EPHEDRINE SULFATE (PRESSORS) 50 MG/ML IJ SOLN
INTRAMUSCULAR | Status: DC | PRN
Start: 2023-05-12 — End: 2023-05-12
  Administered 2023-05-12: 10 mg via INTRAVENOUS

## 2023-05-12 MED ORDER — ACETAMINOPHEN 500 MG PO TABS
ORAL_TABLET | ORAL | Status: AC
  Filled 2023-05-12: qty 2

## 2023-05-12 MED ORDER — SUGAMMADEX SODIUM 200 MG/2ML IV SOLN
INTRAVENOUS | Status: DC | PRN
  Administered 2023-05-12: 200 mg via INTRAVENOUS

## 2023-05-12 MED ORDER — GABAPENTIN 300 MG PO CAPS
300.0000 mg | ORAL_CAPSULE | ORAL | Status: AC
  Administered 2023-05-12: 300 mg via ORAL

## 2023-05-12 MED ORDER — ONDANSETRON HCL 4 MG/2ML IJ SOLN
INTRAMUSCULAR | Status: DC | PRN
  Administered 2023-05-12: 4 mg via INTRAVENOUS

## 2023-05-12 MED ORDER — CEFAZOLIN SODIUM-DEXTROSE 2-4 GM/100ML-% IV SOLN
2.0000 g | INTRAVENOUS | Status: AC
  Administered 2023-05-12: 2 g via INTRAVENOUS

## 2023-05-12 MED ORDER — CHLORHEXIDINE GLUCONATE 0.12 % MT SOLN
OROMUCOSAL | Status: AC
  Filled 2023-05-12: qty 15

## 2023-05-12 MED ORDER — CEFAZOLIN SODIUM-DEXTROSE 2-4 GM/100ML-% IV SOLN
INTRAVENOUS | Status: AC
  Filled 2023-05-12: qty 100

## 2023-05-12 MED ORDER — BUPIVACAINE LIPOSOME 1.3 % IJ SUSP
INTRAMUSCULAR | Status: AC
  Filled 2023-05-12: qty 20

## 2023-05-12 MED ORDER — OXYCODONE HCL 5 MG/5ML PO SOLN: 5.0000 mg | Freq: Once | ORAL | Status: AC | PRN

## 2023-05-12 MED ORDER — EPHEDRINE SULFATE-NACL 50-0.9 MG/10ML-% IV SOSY
PREFILLED_SYRINGE | INTRAVENOUS | Status: DC | PRN
Start: 2023-05-12 — End: 2023-05-12
  Administered 2023-05-12: 10 mg via INTRAVENOUS

## 2023-05-12 MED ORDER — INDOCYANINE GREEN 25 MG IV SOLR
INTRAVENOUS | Status: AC
  Filled 2023-05-12: qty 10

## 2023-05-12 MED ORDER — SEVOFLURANE IN SOLN
RESPIRATORY_TRACT | Status: AC
  Filled 2023-05-12: qty 250

## 2023-05-12 MED ORDER — LIDOCAINE HCL (PF) 2 % IJ SOLN
INTRAMUSCULAR | Status: AC
  Filled 2023-05-12: qty 5

## 2023-05-12 MED ORDER — ROCURONIUM BROMIDE 100 MG/10ML IV SOLN
INTRAVENOUS | Status: DC | PRN
  Administered 2023-05-12: 30 mg via INTRAVENOUS
  Administered 2023-05-12 (×2): 20 mg via INTRAVENOUS

## 2023-05-12 MED ORDER — BUPIVACAINE-EPINEPHRINE (PF) 0.25% -1:200000 IJ SOLN
INTRAMUSCULAR | Status: DC | PRN
  Administered 2023-05-12: 30 mL

## 2023-05-12 MED ORDER — FENTANYL CITRATE (PF) 100 MCG/2ML IJ SOLN
INTRAMUSCULAR | Status: DC | PRN
  Administered 2023-05-12 (×4): 50 ug via INTRAVENOUS

## 2023-05-12 MED ORDER — ONDANSETRON HCL 4 MG/2ML IJ SOLN
4.0000 mg | Freq: Once | INTRAMUSCULAR | Status: AC
  Administered 2023-05-12: 4 mg via INTRAVENOUS

## 2023-05-12 MED ORDER — LABETALOL HCL 5 MG/ML IV SOLN
INTRAVENOUS | Status: DC | PRN
Start: 2023-05-12 — End: 2023-05-12
  Administered 2023-05-12 (×2): 5 mg via INTRAVENOUS

## 2023-05-12 MED ORDER — LIDOCAINE HCL (CARDIAC) PF 100 MG/5ML IV SOSY
PREFILLED_SYRINGE | INTRAVENOUS | Status: DC | PRN
  Administered 2023-05-12: 60 mg via INTRAVENOUS

## 2023-05-12 MED ORDER — OXYCODONE HCL 5 MG PO TABS
ORAL_TABLET | ORAL | Status: AC
  Filled 2023-05-12: qty 1

## 2023-05-12 MED ORDER — BUPIVACAINE LIPOSOME 1.3 % IJ SUSP
INTRAMUSCULAR | Status: DC | PRN
  Administered 2023-05-12: 20 mL

## 2023-05-12 MED ORDER — CHLORHEXIDINE GLUCONATE 0.12 % MT SOLN
15.0000 mL | Freq: Once | OROMUCOSAL | Status: AC
  Administered 2023-05-12: 15 mL via OROMUCOSAL

## 2023-05-12 MED ORDER — PROPOFOL 10 MG/ML IV BOLUS
INTRAVENOUS | Status: AC
  Filled 2023-05-12: qty 20

## 2023-05-12 MED ORDER — HYDROCODONE-ACETAMINOPHEN 5-325 MG PO TABS: 1.0000 | ORAL_TABLET | Freq: Four times a day (QID) | ORAL | 0 refills | Status: DC | PRN

## 2023-05-12 SURGICAL SUPPLY — 58 items
ADH SKN CLS APL DERMABOND .7 (GAUZE/BANDAGES/DRESSINGS) ×2
CANNULA REDUCER 12-8 DVNC XI (CANNULA) ×2 IMPLANT
CATH REDDICK CHOLANGI 4FR 50CM (CATHETERS) IMPLANT
CAUTERY HOOK MNPLR 1.6 DVNC XI (INSTRUMENTS) ×2 IMPLANT
CLIP LIGATING HEMO O LOK GREEN (MISCELLANEOUS) ×2 IMPLANT
DERMABOND ADVANCED .7 DNX12 (GAUZE/BANDAGES/DRESSINGS) ×2 IMPLANT
DISSECTOR KITTNER STICK (MISCELLANEOUS) IMPLANT
DISSECTORS/KITTNER STICK (MISCELLANEOUS) ×2
DRAIN CHANNEL JP 19F (MISCELLANEOUS) IMPLANT
DRAPE ARM DVNC X/XI (DISPOSABLE) ×8 IMPLANT
DRAPE COLUMN DVNC XI (DISPOSABLE) ×2 IMPLANT
ELECT CAUTERY BLADE 6.4 (BLADE) ×2 IMPLANT
ELECT REM PT RETURN 9FT ADLT (ELECTROSURGICAL) ×2
ELECTRODE REM PT RTRN 9FT ADLT (ELECTROSURGICAL) ×2 IMPLANT
EVACUATOR SILICONE 100CC (DRAIN) IMPLANT
FORCEPS BPLR R/ABLATION 8 DVNC (INSTRUMENTS) ×2 IMPLANT
FORCEPS PROGRASP DVNC XI (FORCEP) ×2 IMPLANT
GLOVE BIO SURGEON STRL SZ7 (GLOVE) ×4 IMPLANT
GOWN STRL REUS W/ TWL LRG LVL3 (GOWN DISPOSABLE) ×8 IMPLANT
GOWN STRL REUS W/TWL LRG LVL3 (GOWN DISPOSABLE) ×8
GRASPER SUT TROCAR 14GX15 (MISCELLANEOUS) IMPLANT
GUIDEWIRE 1.6X220 TOCAR TIP (WIRE) IMPLANT
IRRIGATION STRYKERFLOW (MISCELLANEOUS) IMPLANT
IRRIGATOR STRYKERFLOW (MISCELLANEOUS) ×2
IV CATH ANGIO 12GX3 LT BLUE (NEEDLE) IMPLANT
KIT PINK PAD W/HEAD ARE REST (MISCELLANEOUS) ×2
KIT PINK PAD W/HEAD ARM REST (MISCELLANEOUS) ×2 IMPLANT
L-HOOK LAP DISP 36CM (ELECTROSURGICAL) ×2
LABEL OR SOLS (LABEL) ×2 IMPLANT
LHOOK LAP DISP 36CM (ELECTROSURGICAL) IMPLANT
MANIFOLD NEPTUNE II (INSTRUMENTS) ×2 IMPLANT
NDL HYPO 22X1.5 SAFETY MO (MISCELLANEOUS) ×2 IMPLANT
NEEDLE HYPO 22X1.5 SAFETY MO (MISCELLANEOUS) ×2
NS IRRIG 500ML POUR BTL (IV SOLUTION) ×2 IMPLANT
OBTURATOR OPTICAL STND 8 DVNC (TROCAR) ×2
OBTURATOR OPTICALSTD 8 DVNC (TROCAR) ×2 IMPLANT
PACK LAP CHOLECYSTECTOMY (MISCELLANEOUS) ×2 IMPLANT
PENCIL SMOKE EVACUATOR (MISCELLANEOUS) ×2 IMPLANT
RESERVOIR JACKSON-PRATT IMPLANT
SCISSORS METZENBAUM CVD 33 (INSTRUMENTS) IMPLANT
SEAL UNIV 5-12 XI (MISCELLANEOUS) ×8 IMPLANT
SET TUBE SMOKE EVAC HIGH FLOW (TUBING) ×2 IMPLANT
SLEEVE Z-THREAD 5X100MM (TROCAR) IMPLANT
SOL ELECTROSURG ANTI STICK (MISCELLANEOUS) ×2
SOLUTION ELECTROSURG ANTI STCK (MISCELLANEOUS) ×2 IMPLANT
SPIKE FLUID TRANSFER (MISCELLANEOUS) ×2 IMPLANT
SPONGE T-LAP 18X18 ~~LOC~~+RFID (SPONGE) ×2 IMPLANT
SPONGE T-LAP 4X18 ~~LOC~~+RFID (SPONGE) IMPLANT
STOPCOCK 3WAY MALE LL (IV SETS) IMPLANT
SUT ETHILON 3-0 (SUTURE) IMPLANT
SUT MNCRL AB 4-0 PS2 18 (SUTURE) ×2 IMPLANT
SUT VICRYL 0 UR6 27IN ABS (SUTURE) ×4 IMPLANT
SYR 20ML LL LF (SYRINGE) IMPLANT
SYS BAG RETRIEVAL 10MM (BASKET) ×2
SYSTEM BAG RETRIEVAL 10MM (BASKET) ×2 IMPLANT
TRAP FLUID SMOKE EVACUATOR (MISCELLANEOUS) ×2 IMPLANT
WATER STERILE IRR 3000ML UROMA (IV SOLUTION) IMPLANT
WATER STERILE IRR 500ML POUR (IV SOLUTION) ×2 IMPLANT

## 2023-05-12 NOTE — Discharge Instructions (Addendum)
Laparoscopic Cholecystectomy, Care After   These instructions give you information on caring for yourself after your procedure. Your doctor may also give you more specific instructions. Call your doctor if you have any problems or questions after your procedure.  HOME CARE  Change your bandages (dressings) as told by your doctor.  Keep the wound dry and clean. Wash the wound gently with soap and water. Pat the wound dry with a clean towel.  Do not take baths, swim, or use hot tubs for 2 weeks, or as told by your doctor.  Only take medicine as told by your doctor.  Eat a normal diet as told by your doctor.  Do not lift anything heavier than 10 pounds (4.5 kg) until your doctor says it is okay.  Do not play contact sports for 1 week, or as told by your doctor. GET HELP IF:  Your wound is red, puffy (swollen), or painful.  You have yellowish-white fluid (pus) coming from the wound.  You have fluid draining from the wound for more than 1 day.  You have a bad smell coming from the wound.  Your wound breaks open. GET HELP RIGHT AWAY IF:  You have trouble breathing.  You have chest pain.  You have a fever >101  You have pain in the shoulders (shoulder strap areas) that is getting worse.  You feel dizzy or pass out (faint).  You have severe belly (abdominal) pain.  You feel sick to your stomach (nauseous) or throw up (vomit) for more than 1 day. AMBULATORY SURGERY  DISCHARGE INSTRUCTIONS   The drugs that you were given will stay in your system until tomorrow so for the next 24 hours you should not:  Drive an automobile Make any legal decisions Drink any alcoholic beverage   You may resume regular meals tomorrow.  Today it is better to start with liquids and gradually work up to solid foods.  You may eat anything you prefer, but it is better to start with liquids, then soup and crackers, and gradually work up to solid foods.   Please notify your doctor immediately if you have any  unusual bleeding, trouble breathing, redness and pain at the surgery site, drainage, fever, or pain not relieved by medication.   Additional Instructions: DO NOT TAKE TEAL BRACELET OFF FOR 4 days, 96 hours, 05/16/2023  Information for Discharge Teaching: EXPAREL (bupivacaine liposome injectable suspension)   Pain relief is important to your recovery. The goal is to control your pain so you can move easier and return to your normal activities as soon as possible after your procedure. Your physician may use several types of medicines to manage pain, swelling, and more.  Your surgeon or anesthesiologist gave you EXPAREL(bupivacaine) to help control your pain after surgery.  EXPAREL is a local anesthetic designed to release slowly over an extended period of time to provide pain relief by numbing the tissue around the surgical site. EXPAREL is designed to release pain medication over time and can control pain for up to 72 hours. Depending on how you respond to EXPAREL, you may require less pain medication during your recovery. EXPAREL can help reduce or eliminate the need for opioids during the first few days after surgery when pain relief is needed the most. EXPAREL is not an opioid and is not addictive. It does not cause sleepiness or sedation.   Important! A teal colored band has been placed on your arm with the date, time and amount of EXPAREL you have  received. Please leave this armband in place for the full 96 hours following administration, and then you may remove the band. If you return to the hospital for any reason within 96 hours following the administration of EXPAREL, the armband provides important information that your health care providers to know, and alerts them that you have received this anesthetic.    Possible side effects of EXPAREL: Temporary loss of sensation or ability to move in the area where medication was injected. Nausea, vomiting, constipation Rarely, numbness and  tingling in your mouth or lips, lightheadedness, or anxiety may occur. Call your doctor right away if you think you may be experiencing any of these sensations, or if you have other questions regarding possible side effects.  Follow all other discharge instructions given to you by your surgeon or nurse. Eat a healthy diet and drink plenty of water or other fluids.

## 2023-05-12 NOTE — Interval H&P Note (Signed)
History and Physical Interval Note:  05/12/2023 6:32 AM  Melanie Carrillo  has presented today for surgery, with the diagnosis of biliary colic.  The various methods of treatment have been discussed with the patient and family. After consideration of risks, benefits and other options for treatment, the patient has consented to  Procedure(s): XI ROBOTIC ASSISTED LAPAROSCOPIC CHOLECYSTECTOMY (N/A) INDOCYANINE GREEN FLUORESCENCE IMAGING (ICG) (N/A) as a surgical intervention.  The patient's history has been reviewed, patient examined, no change in status, stable for surgery.  I have reviewed the patient's chart and labs.  Questions were answered to the patient's satisfaction.     Melanie Carrillo

## 2023-05-12 NOTE — Anesthesia Procedure Notes (Addendum)
Procedure Name: Intubation Date/Time: 05/12/2023 8:22 AM  Performed by: Aksh Swart, [place], CRNAPre-anesthesia Checklist: Patient identified, Patient being monitored, Timeout performed, Emergency Drugs available and Suction available Patient Re-evaluated:Patient Re-evaluated prior to induction Oxygen Delivery Method: Circle system utilized Preoxygenation: Pre-oxygenation with 100% oxygen Induction Type: IV induction Ventilation: Mask ventilation without difficulty Laryngoscope Size: McGraph and 3 Grade View: Grade I Tube type: Oral Tube size: 7.0 mm Number of attempts: 1 Airway Equipment and Method: Stylet Placement Confirmation: ETT inserted through vocal cords under direct vision, positive ETCO2 and breath sounds checked- equal and bilateral Secured at: 21 cm Tube secured with: Tape Dental Injury: Teeth and Oropharynx as per pre-operative assessment

## 2023-05-12 NOTE — Progress Notes (Signed)
Patient states left arm bp "always" lower then right arm

## 2023-05-12 NOTE — Transfer of Care (Signed)
Immediate Anesthesia Transfer of Care Note  Patient: NASHAI VANDENBROEK  Procedure(s) Performed: XI ROBOTIC ASSISTED LAPAROSCOPIC CHOLECYSTECTOMY (Abdomen) INDOCYANINE GREEN FLUORESCENCE IMAGING (ICG)  Patient Location: PACU  Anesthesia Type:General  Level of Consciousness: drowsy  Airway & Oxygen Therapy: Patient Spontanous Breathing and Patient connected to face mask oxygen  Post-op Assessment: Report given to RN and Post -op Vital signs reviewed and stable  Post vital signs: Reviewed and stable  Last Vitals:  Vitals Value Taken Time  BP 184/85 05/12/23 1145  Temp 36.3 C 05/12/23 1143  Pulse 69 05/12/23 1146  Resp 15 05/12/23 1146  SpO2 98 % 05/12/23 1146  Vitals shown include unfiled device data.  Last Pain:  Vitals:   05/12/23 1143  TempSrc: Temporal  PainSc:          Complications: No notable events documented.

## 2023-05-12 NOTE — Progress Notes (Signed)
Patient up sitting in recliner, no further emesis noted, medications effective. Pain management reviewed with patient, verbalizes understanding.  Needs encouragement and reinforcement to cough and deep breath, able to use IS goal of 1000, will send home with patient. Afebrile, vitals stable at present. JP drain intact RLQ.

## 2023-05-12 NOTE — Anesthesia Preprocedure Evaluation (Signed)
Anesthesia Evaluation  Patient identified by MRN, date of birth, ID band Patient awake    Reviewed: Allergy & Precautions, NPO status , Patient's Chart, lab work & pertinent test results  History of Anesthesia Complications Negative for: history of anesthetic complications  Airway Mallampati: III  TM Distance: >3 FB Neck ROM: full    Dental no notable dental hx.    Pulmonary shortness of breath, sleep apnea  ILD   Pulmonary exam normal        Cardiovascular hypertension, On Medications negative cardio ROS Normal cardiovascular exam     Neuro/Psych  PSYCHIATRIC DISORDERS Anxiety Depression     Neuromuscular disease    GI/Hepatic Neg liver ROS,GERD  Medicated,,  Endo/Other  negative endocrine ROS    Renal/GU      Musculoskeletal  (+) Arthritis ,    Abdominal   Peds  Hematology negative hematology ROS (+)   Anesthesia Other Findings Past Medical History: No date: Allergy No date: Anxiety No date: Arthritis No date: Calculus of gallbladder and bile duct with obstruction  without cholecystitis No date: Cataract No date: Choledocholithiasis No date: Colon cancer (HCC) No date: Constipation No date: DDD (degenerative disc disease), lumbar No date: Depression No date: Dyspnea No date: GERD (gastroesophageal reflux disease) No date: Glaucoma 2023: Hip fracture requiring operative repair [facility]) No date: HLD (hyperlipidemia) No date: Hypertension No date: Interstitial lung disease (HCC) No date: Left hip postoperative wound infection 01/29/2014: Obstructive sleep apnea     Comment:  No Cpap No date: Osteomyelitis (HCC) No date: Osteoporosis No date: Rhabdomyolysis No date: Shingles  Past Surgical History: No date: APPENDECTOMY No date: CARDIAC CATHETERIZATION No date: COLON SURGERY No date: COLONOSCOPY 04/28/2023: ENDOSCOPIC RETROGRADE CHOLANGIOPANCREATOGRAPHY (ERCP)  WITH PROPOFOL; N/A     Comment:   Procedure: ENDOSCOPIC RETROGRADE               CHOLANGIOPANCREATOGRAPHY (ERCP) WITH PROPOFOL;  Surgeon:               Midge Minium, MD;  Location: [facility] ENDOSCOPY;  Service:               Endoscopy;  Laterality: N/A; No date: EYE SURGERY; Bilateral     Comment:  cataracts 04/28/2022: FEMUR IM NAIL; Left     Comment:  Procedure: REVISION FIXATION OF LEFT FEMUR FRACTURE;                Surgeon: Roby Lofts, MD;  Location: [facility] OR;  Service:              Orthopedics;  Laterality: Left; No date: FRACTURE SURGERY 04/28/2022: HARDWARE REMOVAL; Left     Comment:  Procedure: HARDWARE REMOVAL;  Surgeon: Roby Lofts,               MD;  Location: [facility] OR;  Service: Orthopedics;  Laterality:              Left; No date: HERNIA REPAIR 06/23/2022: I & D EXTREMITY; Left     Comment:  Procedure: IRRIGATION AND DEBRIDEMENT LEFT HIP;                Surgeon: Roby Lofts, MD;  Location: [facility] OR;  Service:              Orthopedics;  Laterality: Left; 07/28/2022: I & D EXTREMITY; Left     Comment:  Procedure: IRRIGATION AND DEBRIDEMENT HIP;  Surgeon:  Haddix, Gillie Manners, MD;  Location: [facility] OR;  Service:               Orthopedics;  Laterality: Left; 12/26/2021: INTRAMEDULLARY (IM) NAIL INTERTROCHANTERIC; Left     Comment:  Procedure: INTRAMEDULLARY (IM) NAIL INTERTROCHANTRIC;                Surgeon: Ross Marcus, MD;  Location: [facility] ORS;                Service: Orthopedics;  Laterality: Left; No date: MULTIPLE TOOTH EXTRACTIONS     Comment:  full dentures 04/28/2023: REMOVAL OF STONES     Comment:  Procedure: REMOVAL OF STONES;  Surgeon: Midge Minium,               MD;  Location: [facility] ENDOSCOPY;  Service: Endoscopy;; No date: shoulder  surgery; Left 04/28/2023: SPHINCTEROTOMY     Comment:  Procedure: SPHINCTEROTOMY;  Surgeon: Midge Minium, MD;                Location: [facility] ENDOSCOPY;  Service: Endoscopy;; No date: TONSILLECTOMY No date: TUBAL LIGATION No date: UPPER GI ENDOSCOPY No  date: WRIST SURGERY; Right  BMI    Body Mass Index: 25.23 kg/m      Reproductive/Obstetrics negative OB ROS                             Anesthesia Physical Anesthesia Plan  ASA: 3  Anesthesia Plan: General ETT   Post-op Pain Management: Gabapentin PO (pre-op)*, Tylenol PO (pre-op)* and Toradol IV (intra-op)*   Induction: Intravenous  PONV Risk Score and Plan: 4 or greater and Ondansetron, Dexamethasone and Treatment may vary due to age or medical condition  Airway Management Planned: Oral ETT  Additional Equipment:   Intra-op Plan:   Post-operative Plan: Extubation in OR  Informed Consent: I have reviewed the patients History and Physical, chart, labs and discussed the procedure including the risks, benefits and alternatives for the proposed anesthesia with the patient or authorized representative who has indicated his/her understanding and acceptance.     Dental Advisory Given  Plan Discussed with: Anesthesiologist, CRNA and Surgeon  Anesthesia Plan Comments: (Patient consented for risks of anesthesia including but not limited to:  - adverse reactions to medications - damage to eyes, teeth, lips or other oral mucosa - nerve damage due to positioning  - sore throat or hoarseness - Damage to heart, brain, nerves, lungs, other parts of body or loss of life  Patient voiced understanding.)       Anesthesia Quick Evaluation

## 2023-05-12 NOTE — Anesthesia Postprocedure Evaluation (Signed)
Anesthesia Post Note  Patient: Melanie Carrillo  Procedure(s) Performed: XI ROBOTIC ASSISTED LAPAROSCOPIC CHOLECYSTECTOMY (Abdomen) INDOCYANINE GREEN FLUORESCENCE IMAGING (ICG)  Patient location during evaluation: PACU Anesthesia Type: General Level of consciousness: awake and alert Pain management: pain level controlled Vital Signs Assessment: post-procedure vital signs reviewed and stable Respiratory status: spontaneous breathing, nonlabored ventilation, respiratory function stable and patient connected to nasal cannula oxygen Cardiovascular status: blood pressure returned to baseline and stable Postop Assessment: no apparent nausea or vomiting Anesthetic complications: no   No notable events documented.   Last Vitals:  Vitals:   05/12/23 1200 05/12/23 1215  BP: (!) 168/70 135/85  Pulse: 73 77  Resp: 14 18  Temp: (!) 36.1 C   SpO2: 94% 95%    Last Pain:  Vitals:   05/12/23 1215  TempSrc:   PainSc: 7                  Louie Boston

## 2023-05-12 NOTE — Op Note (Signed)
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 with endotracheal tube  Findings: Severe and extensive adhesion from the omentum to the abd wall and small bowel to abd wall Chronic severe Cholecystitis with empyema of the Gallbladder No evidence of bowel or bile injuries Very challenging case due to abdominal wall hostility  Estimated Blood Loss: 10cc       Specimens: Gallbladder           Complications: none   Procedure Details  The patient was seen again in the Holding Room. The benefits, complications, treatment options, and expected outcomes were discussed with the patient. The risks of bleeding, infection, recurrence of symptoms, failure to resolve symptoms, bile duct damage, bile duct leak, retained common bile duct stone, bowel injury, any of which could require further surgery and/or ERCP, stent, or papillotomy were reviewed with the patient. The likelihood of improving the patient's symptoms with return to their baseline status is good.  The patient and/or family concurred with the proposed plan, giving informed consent.  The patient was taken to Operating Room, identified  and the procedure verified as Laparoscopic Cholecystectomy.  A Time Out was held and the above information confirmed.  Prior to the induction of general anesthesia, antibiotic prophylaxis was administered. VTE prophylaxis was in place. General endotracheal anesthesia was then administered and tolerated well. After the induction, the abdomen was prepped with Chloraprep and draped in the sterile fashion. The patient was positioned in the supine  position.  Veres needle was inserted at Plamer's point, no clear cut access to the abominal cavity. I then did an optiview approach using a left latera upper quadrant incision. I was able to gain access to the abdominal cavity. . Pneumoperitoneum was then created with CO2 and tolerated well without any adverse changes in the patient's vital signs.   I was able to have a very narrow window of dissection within the lEft upper quadrant. The veres needle never penetrated the abd cavity. I placed an additional 5 mm port. Inspection revealed severe adhesive disease but no bowel injuries. Meticulous LOA performed sharply with scissors, this was extensive as there was also previous mesh and pt has had multiple abd operations. I was able to further place two 8 mm pport on the right of the abdominal wall and continue my dissection and LOA alternating on the right and left side of the abdominal wall. No injuries see.  The patient was positioned  in reverse Trendelenburg, robot was brought to the surgical field and docked in the standard fashion.  We made sure all the instrumentation was kept indirect view at all times and that there were no collision between the arms. I scrubbed out and went to the console.  The gallbladder was identified, it was inflamed and dilated the fundus grasped and retracted cephalad. Adhesions were lysed bluntly. The infundibulum was grasped and retracted laterally, exposing the peritoneum overlying the triangle of Calot. This was then divided and exposed in a blunt fashion. An extended critical view of the cystic duct and cystic artery was obtained.  The cystic duct was clearly identified and bluntly dissected.   Artery and duct were double clipped and divided. Using ICG cholangiography we visualize the cystic duct and  CBD w/o evidence of bile injuries.The wall of the GB was very thin and inflamed The gallbladder was taken from the gallbladder fossa in a retrograde fashion with the  electrocautery. Upon doing this the wall perforated due to chronic inflammation, we then visualized pus coming out of the Gallbladder. It was aspirated and irrigated. Hemostasis was achieved with the electrocautery.I nspection of the right upper quadrant was performed. No bleeding, bile duct injury or leak, or bowel injury was noted. Robotic instruments and robotic arms were undocked in the standard fashion.  I scrubbed back in.  The gallbladder was removed and placed in an Endocatch bag.  I placed a 19 blake drain on the RUQ to drain the purulence and infection. Pneumoperitoneum was released.  The periumbilical port site was closed with interrumpted 0 Vicryl sutures. 4-0 subcuticular Monocryl was used to close the skin. Dermabond was  applied.  The patient was then extubated and brought to the recovery room in stable condition. Sponge, lap, and needle counts were correct at closure and at the conclusion of the case.               Sterling Big, MD, FACS

## 2023-05-13 ENCOUNTER — Encounter: Payer: Self-pay | Admitting: Surgery

## 2023-05-13 ENCOUNTER — Emergency Department: Payer: Medicare HMO

## 2023-05-13 ENCOUNTER — Telehealth: Payer: Self-pay | Admitting: *Deleted

## 2023-05-13 ENCOUNTER — Inpatient Hospital Stay
Admission: EM | Admit: 2023-05-13 | Discharge: 2023-05-16 | DRG: 856 | Disposition: A | Discharge status: Home / Self Care | Payer: Medicare HMO | Attending: Internal Medicine | Admitting: Internal Medicine

## 2023-05-13 ENCOUNTER — Ambulatory Visit (INDEPENDENT_AMBULATORY_CARE_PROVIDER_SITE_OTHER): Payer: Medicare HMO | Admitting: Surgery

## 2023-05-13 VITALS — BP 92/69 | HR 136 | Temp 99.7°F | Ht 64.0 in | Wt 148.0 lb

## 2023-05-13 DIAGNOSIS — E782 Mixed hyperlipidemia: Secondary | ICD-10-CM | POA: Diagnosis not present

## 2023-05-13 DIAGNOSIS — K812 Acute cholecystitis with chronic cholecystitis: Principal | ICD-10-CM | POA: Diagnosis present

## 2023-05-13 DIAGNOSIS — Z9049 Acquired absence of other specified parts of digestive tract: Secondary | ICD-10-CM | POA: Diagnosis not present

## 2023-05-13 DIAGNOSIS — I10 Essential (primary) hypertension: Secondary | ICD-10-CM | POA: Diagnosis not present

## 2023-05-13 DIAGNOSIS — Z85038 Personal history of other malignant neoplasm of large intestine: Secondary | ICD-10-CM | POA: Diagnosis not present

## 2023-05-13 DIAGNOSIS — J849 Interstitial pulmonary disease, unspecified: Secondary | ICD-10-CM | POA: Diagnosis present

## 2023-05-13 DIAGNOSIS — Z79899 Other long term (current) drug therapy: Secondary | ICD-10-CM | POA: Diagnosis not present

## 2023-05-13 DIAGNOSIS — Z8249 Family history of ischemic heart disease and other diseases of the circulatory system: Secondary | ICD-10-CM | POA: Diagnosis not present

## 2023-05-13 DIAGNOSIS — F5102 Adjustment insomnia: Secondary | ICD-10-CM | POA: Diagnosis not present

## 2023-05-13 DIAGNOSIS — J9811 Atelectasis: Secondary | ICD-10-CM | POA: Diagnosis not present

## 2023-05-13 DIAGNOSIS — R6520 Severe sepsis without septic shock: Secondary | ICD-10-CM

## 2023-05-13 DIAGNOSIS — E860 Dehydration: Secondary | ICD-10-CM

## 2023-05-13 DIAGNOSIS — Z09 Encounter for follow-up examination after completed treatment for conditions other than malignant neoplasm: Secondary | ICD-10-CM

## 2023-05-13 DIAGNOSIS — G4733 Obstructive sleep apnea (adult) (pediatric): Secondary | ICD-10-CM | POA: Diagnosis present

## 2023-05-13 DIAGNOSIS — I700 Atherosclerosis of aorta: Secondary | ICD-10-CM | POA: Diagnosis not present

## 2023-05-13 DIAGNOSIS — R109 Unspecified abdominal pain: Secondary | ICD-10-CM | POA: Diagnosis not present

## 2023-05-13 DIAGNOSIS — F419 Anxiety disorder, unspecified: Secondary | ICD-10-CM | POA: Diagnosis present

## 2023-05-13 DIAGNOSIS — J984 Other disorders of lung: Secondary | ICD-10-CM | POA: Diagnosis not present

## 2023-05-13 DIAGNOSIS — K219 Gastro-esophageal reflux disease without esophagitis: Secondary | ICD-10-CM | POA: Diagnosis not present

## 2023-05-13 DIAGNOSIS — R188 Other ascites: Secondary | ICD-10-CM | POA: Diagnosis not present

## 2023-05-13 DIAGNOSIS — T8144XA Sepsis following a procedure, initial encounter: Principal | ICD-10-CM | POA: Diagnosis present

## 2023-05-13 DIAGNOSIS — Z90710 Acquired absence of both cervix and uterus: Secondary | ICD-10-CM | POA: Diagnosis not present

## 2023-05-13 DIAGNOSIS — M25512 Pain in left shoulder: Secondary | ICD-10-CM | POA: Diagnosis not present

## 2023-05-13 DIAGNOSIS — T8140XA Infection following a procedure, unspecified, initial encounter: Secondary | ICD-10-CM

## 2023-05-13 DIAGNOSIS — R0989 Other specified symptoms and signs involving the circulatory and respiratory systems: Secondary | ICD-10-CM | POA: Diagnosis not present

## 2023-05-13 DIAGNOSIS — J189 Pneumonia, unspecified organism: Secondary | ICD-10-CM | POA: Diagnosis not present

## 2023-05-13 DIAGNOSIS — A419 Sepsis, unspecified organism: Secondary | ICD-10-CM | POA: Diagnosis not present

## 2023-05-13 DIAGNOSIS — Y838 Other surgical procedures as the cause of abnormal reaction of the patient, or of later complication, without mention of misadventure at the time of the procedure: Secondary | ICD-10-CM | POA: Diagnosis present

## 2023-05-13 DIAGNOSIS — F32A Depression, unspecified: Secondary | ICD-10-CM | POA: Diagnosis present

## 2023-05-13 DIAGNOSIS — Z1152 Encounter for screening for COVID-19: Secondary | ICD-10-CM | POA: Diagnosis not present

## 2023-05-13 DIAGNOSIS — R000 Tachycardia, unspecified: Secondary | ICD-10-CM | POA: Diagnosis not present

## 2023-05-13 DIAGNOSIS — R918 Other nonspecific abnormal finding of lung field: Secondary | ICD-10-CM | POA: Diagnosis not present

## 2023-05-13 DIAGNOSIS — Z743 Need for continuous supervision: Secondary | ICD-10-CM | POA: Diagnosis not present

## 2023-05-13 HISTORY — DX: Severe sepsis without septic shock: R65.20

## 2023-05-13 LAB — CBC WITH DIFFERENTIAL/PLATELET
Abs Immature Granulocytes: 0.12 10*3/uL — ABNORMAL HIGH (ref 0.00–0.07)
Basophils Absolute: 0.1 10*3/uL (ref 0.0–0.1)
Basophils Relative: 0 %
Eosinophils Absolute: 0 10*3/uL (ref 0.0–0.5)
Eosinophils Relative: 0 %
HCT: 36.8 % (ref 36.0–46.0)
Hemoglobin: 11.8 g/dL — ABNORMAL LOW (ref 12.0–15.0)
Immature Granulocytes: 1 %
Lymphocytes Relative: 13 %
Lymphs Abs: 2.6 10*3/uL (ref 0.7–4.0)
MCH: 34 pg (ref 26.0–34.0)
MCHC: 32.1 g/dL (ref 30.0–36.0)
MCV: 106.1 fL — ABNORMAL HIGH (ref 80.0–100.0)
Monocytes Absolute: 0.9 10*3/uL (ref 0.1–1.0)
Monocytes Relative: 5 %
Neutro Abs: 16.5 10*3/uL — ABNORMAL HIGH (ref 1.7–7.7)
Neutrophils Relative %: 81 %
Platelets: 240 10*3/uL (ref 150–400)
RBC: 3.47 MIL/uL — ABNORMAL LOW (ref 3.87–5.11)
RDW: 12.8 % (ref 11.5–15.5)
WBC: 20.2 10*3/uL — ABNORMAL HIGH (ref 4.0–10.5)
nRBC: 0.1 % (ref 0.0–0.2)

## 2023-05-13 LAB — PROCALCITONIN: Procalcitonin: 0.45 ng/mL

## 2023-05-13 LAB — PROTIME-INR
INR: 1.3 — ABNORMAL HIGH (ref 0.8–1.2)
Prothrombin Time: 16.4 seconds — ABNORMAL HIGH (ref 11.4–15.2)

## 2023-05-13 LAB — COMPREHENSIVE METABOLIC PANEL
ALT: 34 U/L (ref 0–44)
AST: 58 U/L — ABNORMAL HIGH (ref 15–41)
Albumin: 3 g/dL — ABNORMAL LOW (ref 3.5–5.0)
Alkaline Phosphatase: 88 U/L (ref 38–126)
Anion gap: 9 (ref 5–15)
BUN: 11 mg/dL (ref 8–23)
CO2: 24 mmol/L (ref 22–32)
Calcium: 9.9 mg/dL (ref 8.9–10.3)
Chloride: 97 mmol/L — ABNORMAL LOW (ref 98–111)
Creatinine, Ser: 0.63 mg/dL (ref 0.44–1.00)
GFR, Estimated: >60 mL/min (ref >60)
Glucose, Bld: 143 mg/dL — ABNORMAL HIGH (ref 70–99)
Potassium: 4 mmol/L (ref 3.5–5.1)
Sodium: 130 mmol/L — ABNORMAL LOW (ref 135–145)
Total Bilirubin: 1.4 mg/dL — ABNORMAL HIGH (ref 0.3–1.2)
Total Protein: 7.1 g/dL (ref 6.5–8.1)

## 2023-05-13 LAB — LACTIC ACID, PLASMA
Lactic Acid, Venous: 1.9 mmol/L (ref 0.5–1.9)
Lactic Acid, Venous: 0.9 mmol/L (ref 0.5–1.9)

## 2023-05-13 LAB — RESP PANEL BY RT-PCR (RSV, FLU A&B, COVID)  RVPGX2
Influenza A by PCR: NEGATIVE
Influenza B by PCR: NEGATIVE
Resp Syncytial Virus by PCR: NEGATIVE
SARS Coronavirus 2 by RT PCR: NEGATIVE

## 2023-05-13 LAB — MRSA NEXT GEN BY PCR, NASAL: MRSA by PCR Next Gen: NOT DETECTED

## 2023-05-13 LAB — URINALYSIS, W/ REFLEX TO CULTURE (INFECTION SUSPECTED)
Bacteria, UA: NONE SEEN
Bilirubin Urine: NEGATIVE
Glucose, UA: NEGATIVE mg/dL
Hgb urine dipstick: NEGATIVE
Ketones, ur: NEGATIVE mg/dL
Leukocytes,Ua: NEGATIVE
Nitrite: NEGATIVE
Protein, ur: NEGATIVE mg/dL
Specific Gravity, Urine: 1.016 (ref 1.005–1.030)
pH: 6 (ref 5.0–8.0)

## 2023-05-13 LAB — APTT: aPTT: 36 seconds (ref 24–36)

## 2023-05-13 LAB — SURGICAL PATHOLOGY

## 2023-05-13 LAB — LIPASE, BLOOD: Lipase: 26 U/L (ref 11–51)

## 2023-05-13 MED ORDER — ACETAMINOPHEN 650 MG RE SUPP: 650.0000 mg | Freq: Four times a day (QID) | RECTAL | Status: DC | PRN

## 2023-05-13 MED ORDER — ONDANSETRON HCL 4 MG PO TABS: 4.0000 mg | ORAL_TABLET | Freq: Four times a day (QID) | ORAL | Status: DC | PRN

## 2023-05-13 MED ORDER — SERTRALINE HCL 25 MG PO TABS
75.0000 mg | ORAL_TABLET | Freq: Every day | ORAL | Status: DC
  Administered 2023-05-13 – 2023-05-16 (×4): 75 mg via ORAL
  Filled 2023-05-13 (×5): qty 3

## 2023-05-13 MED ORDER — SERTRALINE HCL 50 MG PO TABS: 25.0000 mg | ORAL_TABLET | Freq: Every day | ORAL | Status: DC

## 2023-05-13 MED ORDER — ALBUMIN HUMAN 25 % IV SOLN
25.0000 g | Freq: Once | INTRAVENOUS | Status: AC
  Administered 2023-05-13: 25 g via INTRAVENOUS
  Filled 2023-05-13: qty 100

## 2023-05-13 MED ORDER — SODIUM CHLORIDE 0.9 % IV SOLN
500.0000 mg | Freq: Every day | INTRAVENOUS | Status: DC
  Administered 2023-05-13 – 2023-05-14 (×2): 500 mg via INTRAVENOUS
  Filled 2023-05-13 (×2): qty 5

## 2023-05-13 MED ORDER — ONDANSETRON HCL 4 MG/2ML IJ SOLN: 4.0000 mg | Freq: Four times a day (QID) | INTRAMUSCULAR | Status: DC | PRN

## 2023-05-13 MED ORDER — ACETAMINOPHEN 325 MG PO TABS
650.0000 mg | ORAL_TABLET | Freq: Four times a day (QID) | ORAL | Status: DC | PRN
  Administered 2023-05-14 (×2): 650 mg via ORAL
  Filled 2023-05-13 (×2): qty 2

## 2023-05-13 MED ORDER — SENNOSIDES-DOCUSATE SODIUM 8.6-50 MG PO TABS
1.0000 | ORAL_TABLET | Freq: Every evening | ORAL | Status: DC | PRN
  Administered 2023-05-15: 1 via ORAL
  Filled 2023-05-13: qty 1

## 2023-05-13 MED ORDER — VANCOMYCIN HCL 1500 MG/300ML IV SOLN
1500.0000 mg | Freq: Once | INTRAVENOUS | Status: AC
  Administered 2023-05-13: 1500 mg via INTRAVENOUS
  Filled 2023-05-13: qty 300

## 2023-05-13 MED ORDER — MORPHINE SULFATE (PF) 4 MG/ML IV SOLN
4.0000 mg | Freq: Once | INTRAVENOUS | Status: AC
  Administered 2023-05-13: 4 mg via INTRAVENOUS
  Filled 2023-05-13: qty 1

## 2023-05-13 MED ORDER — IOHEXOL 300 MG/ML  SOLN
80.0000 mL | Freq: Once | INTRAMUSCULAR | Status: AC | PRN
  Administered 2023-05-13: 80 mL via INTRAVENOUS

## 2023-05-13 MED ORDER — PANTOPRAZOLE SODIUM 40 MG PO TBEC
80.0000 mg | DELAYED_RELEASE_TABLET | Freq: Every day | ORAL | Status: DC
  Administered 2023-05-13 – 2023-05-15 (×3): 80 mg via ORAL
  Filled 2023-05-13 (×3): qty 2

## 2023-05-13 MED ORDER — BRIMONIDINE TARTRATE 0.2 % OP SOLN
1.0000 [drp] | Freq: Two times a day (BID) | OPHTHALMIC | Status: DC
  Administered 2023-05-13 – 2023-05-16 (×5): 1 [drp] via OPHTHALMIC
  Filled 2023-05-13: qty 5

## 2023-05-13 MED ORDER — SODIUM CHLORIDE 0.9 % IV SOLN
2.0000 g | INTRAVENOUS | Status: DC
  Administered 2023-05-14 – 2023-05-16 (×3): 2 g via INTRAVENOUS
  Filled 2023-05-13 (×3): qty 20

## 2023-05-13 MED ORDER — ADULT MULTIVITAMIN W/MINERALS CH
1.0000 | ORAL_TABLET | Freq: Every day | ORAL | Status: DC
  Administered 2023-05-13 – 2023-05-16 (×4): 1 via ORAL
  Filled 2023-05-13 (×4): qty 1

## 2023-05-13 MED ORDER — LACTATED RINGERS IV SOLN: 150.0000 mL/h | INTRAVENOUS | Status: DC

## 2023-05-13 MED ORDER — FENTANYL CITRATE PF 50 MCG/ML IJ SOSY: 25.0000 ug | PREFILLED_SYRINGE | INTRAMUSCULAR | Status: AC | PRN

## 2023-05-13 MED ORDER — HYDRALAZINE HCL 20 MG/ML IJ SOLN
5.0000 mg | Freq: Four times a day (QID) | INTRAMUSCULAR | Status: DC | PRN
  Filled 2023-05-13: qty 1

## 2023-05-13 MED ORDER — HEPARIN SODIUM (PORCINE) 5000 UNIT/ML IJ SOLN
5000.0000 [IU] | Freq: Three times a day (TID) | INTRAMUSCULAR | Status: DC
  Administered 2023-05-14 – 2023-05-16 (×7): 5000 [IU] via SUBCUTANEOUS
  Filled 2023-05-13 (×6): qty 1

## 2023-05-13 MED ORDER — SODIUM CHLORIDE 0.9 % IV BOLUS (SEPSIS)
1000.0000 mL | Freq: Once | INTRAVENOUS | Status: AC
  Administered 2023-05-13: 1000 mL via INTRAVENOUS

## 2023-05-13 MED ORDER — LACTATED RINGERS IV SOLN: INTRAVENOUS | Status: DC

## 2023-05-13 MED ORDER — LATANOPROST 0.005 % OP SOLN
1.0000 [drp] | Freq: Every day | OPHTHALMIC | Status: DC
  Administered 2023-05-13 – 2023-05-15 (×3): 1 [drp] via OPHTHALMIC
  Filled 2023-05-13: qty 2.5

## 2023-05-13 MED ORDER — METRONIDAZOLE 500 MG/100ML IV SOLN
500.0000 mg | Freq: Once | INTRAVENOUS | Status: AC
  Administered 2023-05-13: 500 mg via INTRAVENOUS
  Filled 2023-05-13: qty 100

## 2023-05-13 MED ORDER — PRAVASTATIN SODIUM 20 MG PO TABS
40.0000 mg | ORAL_TABLET | Freq: Every day | ORAL | Status: DC
  Administered 2023-05-14 – 2023-05-15 (×2): 40 mg via ORAL
  Filled 2023-05-13 (×2): qty 2

## 2023-05-13 MED ORDER — MORPHINE SULFATE (PF) 2 MG/ML IV SOLN
2.0000 mg | INTRAVENOUS | Status: AC | PRN
  Administered 2023-05-13 – 2023-05-14 (×4): 2 mg via INTRAVENOUS
  Filled 2023-05-13 (×4): qty 1

## 2023-05-13 MED ORDER — LOSARTAN POTASSIUM 50 MG PO TABS
100.0000 mg | ORAL_TABLET | ORAL | Status: DC
  Administered 2023-05-13 – 2023-05-16 (×3): 100 mg via ORAL
  Filled 2023-05-13 (×3): qty 2

## 2023-05-13 MED ORDER — SODIUM CHLORIDE 0.9 % IV SOLN: 500.0000 mg | INTRAVENOUS | Status: DC

## 2023-05-13 MED ORDER — TRAZODONE HCL 50 MG PO TABS
25.0000 mg | ORAL_TABLET | Freq: Every evening | ORAL | Status: DC | PRN
  Filled 2023-05-13: qty 1

## 2023-05-13 MED ORDER — GUAIFENESIN ER 600 MG PO TB12: 600.0000 mg | ORAL_TABLET | Freq: Two times a day (BID) | ORAL | Status: DC | PRN

## 2023-05-13 MED ORDER — SODIUM CHLORIDE 0.9 % IV SOLN
2.0000 g | Freq: Once | INTRAVENOUS | Status: AC
  Administered 2023-05-13: 2 g via INTRAVENOUS
  Filled 2023-05-13: qty 20

## 2023-05-13 MED ORDER — ONDANSETRON HCL 4 MG/2ML IJ SOLN
4.0000 mg | Freq: Once | INTRAMUSCULAR | Status: AC
  Administered 2023-05-13: 4 mg via INTRAVENOUS
  Filled 2023-05-13: qty 2

## 2023-05-13 MED ORDER — LOSARTAN POTASSIUM 50 MG PO TABS: 100.0000 mg | ORAL_TABLET | ORAL | Status: DC

## 2023-05-13 MED ORDER — MONTELUKAST SODIUM 10 MG PO TABS
10.0000 mg | ORAL_TABLET | Freq: Every day | ORAL | Status: DC
  Administered 2023-05-13 – 2023-05-15 (×3): 10 mg via ORAL
  Filled 2023-05-13 (×3): qty 1

## 2023-05-13 MED ORDER — VITAMIN D 25 MCG (1000 UNIT) PO TABS
5000.0000 [IU] | ORAL_TABLET | Freq: Every morning | ORAL | Status: DC
  Administered 2023-05-14 – 2023-05-16 (×3): 5000 [IU] via ORAL
  Filled 2023-05-13 (×3): qty 5

## 2023-05-13 NOTE — H&P (Addendum)
History and Physical   Melanie Carrillo WUX:[id number] DOB: 09/27/1934 DOA: 05/13/2023  PCP: Melanie Ramp, MD  Outpatient Specialists: Dr. Everlene Carrillo, general surgery Patient coming from: General surgery outpatient clinic  I have personally briefly reviewed patient's old medical records in [facility] EMR.  Chief Concern: Abdominal pain, dehydration  HPI: Ms. Melanie Carrillo is a 88 year old female with history of GERD, depression, insomnia, with history of choledocholithiasis with chronic cholecystitis status post robotic assisted laparoscopic cholecystectomy on 05/12/2023.  Vitals in the ED showed temperature of 98.3, respiration rate of 16, heart rate of 136, blood pressure 92/69, SpO2 of 92% on room air.  Serum sodium is 130, potassium 4.0, chloride 97, bicarb 24, BUN of 11, serum creatinine of 0.63, EGFR greater than 60, nonfasting blood glucose 143, WBC 20.2, hemoglobin 11.8, platelets of 240.  AST was 58.  T. bili was 1.4. Lactic acid was 1.9.  COVID/influenza A/influenza B/RSV PCR were negative.  ED treatment: Morphine 4 mg IV x 2 doses ordered, 1 dose given, ondansetron 4 mg IV, ceftriaxone 2 g IV, sodium chloride 1 L bolus, vancomycin, Flagyl, LR 150 mL infusion. ----------------------------- At bedside, patient was able to tell me her name, age, location, current calendar year.  She reports that she was sent to the ED by her general surgeon due to her worsening weakness and concerns for dehydration and abdominal pain.  She endorses generalized abdominal pain and weakness.  She denies fever, nausea, vomiting, chest pain, shortness of breath, dysuria, hematuria, diarrhea.  She endorses chills.  She reports that she has a chronic cough that is unchanged.  ROS: Constitutional: no weight change, no fever ENT/Mouth: no sore throat, no rhinorrhea Eyes: no eye pain, no vision changes Cardiovascular: no chest pain, no dyspnea,  no edema, no palpitations Respiratory: no cough, no  sputum, no wheezing Gastrointestinal: no nausea, no vomiting, no diarrhea, no constipation Genitourinary: no urinary incontinence, no dysuria, no hematuria Musculoskeletal: no arthralgias, no myalgias, + abdominal pain Skin: no skin lesions, no pruritus, Neuro: + weakness, no loss of consciousness, no syncope Psych: no anxiety, no depression, + decrease appetite Heme/Lymph: no bruising, no bleeding  ED Course: Discussed with emergency medicine provider, patient requiring hospitalization for chief concerns of sepsis.  Assessment/Plan  Principal Problem:   Severe sepsis (HCC) Active Problems:   Interstitial lung disease (HCC)   Essential hypertension   GERD (gastroesophageal reflux disease)   Hyperlipidemia, mixed   Depression   Adjustment insomnia   Assessment and Plan:  * Severe sepsis (HCC) Left lower lobe pneumonia With leukocytosis, increased heart rate, WBC of 20.2, organ involvement of pulmonary with oxygen desaturation to 89% on room air Check MRSA PCR screening on admission Blood cultures x 2 are in process Check procalcitonin on admission Suspect secondary to pneumonia Azithromycin 500 mg IV daily, ceftriaxone 2 g IV daily to complete 5-day course were ordered Incentive spirometry and flutter valve every 2 hours while awake Admit to stepdown, inpatient  Hyperlipidemia, mixed Lovastatin 40 mg equivalent/pravastatin 40 mg daily resumed  GERD (gastroesophageal reflux disease) Home PPI nightly resumed  Essential hypertension Hydralazine 5 mg IV every 6 hours as needed for SBP greater 170, 4 days ordered  Interstitial lung disease (HCC) Flutter valve, incentive spirometry  Adjustment insomnia Trazodone 25-50 mg p.o. nightly as needed for sleep resumed  Depression Sertraline 75 mg daily resumed  Chart reviewed.   DVT prophylaxis: Heparin 5000 units subcutaneous every 8 hours Code Status: Full code Diet: Chopped diet Family Communication: Updated grand  niece in law at bedside with patient's permission Disposition Plan: Pending clinical course Consults called: General Surgery Admission status: Stepdown, inpatient  Past Medical History:  Diagnosis Date   Allergy    Anxiety    Arthritis    Calculus of gallbladder and bile duct with obstruction without cholecystitis    Cataract    Choledocholithiasis    Colon cancer (HCC)    Constipation    DDD (degenerative disc disease), lumbar    Depression    Dyspnea    GERD (gastroesophageal reflux disease)    Glaucoma    Hip fracture requiring operative repair (HCC) 2023   HLD (hyperlipidemia)    Hypertension    Interstitial lung disease (HCC)    Left hip postoperative wound infection    Obstructive sleep apnea 01/29/2014   No Cpap   Osteomyelitis (HCC)    Osteoporosis    Rhabdomyolysis    Shingles    Past Surgical History:  Procedure Laterality Date   APPENDECTOMY     CARDIAC CATHETERIZATION     COLON SURGERY     COLONOSCOPY     ENDOSCOPIC RETROGRADE CHOLANGIOPANCREATOGRAPHY (ERCP) WITH PROPOFOL N/A 04/28/2023   Procedure: ENDOSCOPIC RETROGRADE CHOLANGIOPANCREATOGRAPHY (ERCP) WITH PROPOFOL;  Surgeon: Midge Minium, MD;  Location: [facility];  Service: Endoscopy;  Laterality: N/A;   EYE SURGERY Bilateral    cataracts   FEMUR IM NAIL Left 04/28/2022   Procedure: REVISION FIXATION OF LEFT FEMUR FRACTURE;  Surgeon: Roby Lofts, MD;  Location: [facility];  Service: Orthopedics;  Laterality: Left;   FRACTURE SURGERY     HARDWARE REMOVAL Left 04/28/2022   Procedure: HARDWARE REMOVAL;  Surgeon: Roby Lofts, MD;  Location: [facility] OR;  Service: Orthopedics;  Laterality: Left;   HERNIA REPAIR     I & D EXTREMITY Left 06/23/2022   Procedure: IRRIGATION AND DEBRIDEMENT LEFT HIP;  Surgeon: Roby Lofts, MD;  Location: [facility] OR;  Service: Orthopedics;  Laterality: Left;   I & D EXTREMITY Left 07/28/2022   Procedure: IRRIGATION AND DEBRIDEMENT HIP;  Surgeon: Roby Lofts, MD;  Location:  [facility] OR;  Service: Orthopedics;  Laterality: Left;   INTRAMEDULLARY (IM) NAIL INTERTROCHANTERIC Left 12/26/2021   Procedure: INTRAMEDULLARY (IM) NAIL INTERTROCHANTRIC;  Surgeon: Ross Marcus, MD;  Location: [facility] ORS;  Service: Orthopedics;  Laterality: Left;   MULTIPLE TOOTH EXTRACTIONS     full dentures   REMOVAL OF STONES  04/28/2023   Procedure: REMOVAL OF STONES;  Surgeon: Midge Minium, MD;  Location: [facility];  Service: Endoscopy;;   shoulder  surgery Left    SPHINCTEROTOMY  04/28/2023   Procedure: SPHINCTEROTOMY;  Surgeon: Midge Minium, MD;  Location: [facility];  Service: Endoscopy;;   TONSILLECTOMY     TUBAL LIGATION     UPPER GI ENDOSCOPY     WRIST SURGERY Right    Social History:  reports that she has never smoked. She has never been exposed to tobacco smoke. She has never used smokeless tobacco. She reports that she does not drink alcohol and does not use drugs.  No Known Allergies Family History  Problem Relation Age of Onset   Hypertension Mother    Heart disease Mother    Heart disease Father    Family history: Family history reviewed and not pertinent.  Prior to Admission medications   Medication Sig Start Date End Date Taking? Authorizing Provider  amoxicillin-clavulanate (AUGMENTIN) 875-125 MG tablet Take 1 tablet by mouth 2 (two) times daily. 05/12/23  Yes Pabon, Merri Ray, MD  brimonidine (ALPHAGAN) 0.2 % ophthalmic solution Place 1 drop into both eyes in the morning and at bedtime. 12/15/21  Yes [provider]  Cholecalciferol (VITAMIN D-3) 125 MCG (5000 UT) TABS Take 5,000 Units by mouth in the morning.   Yes [provider]  guaiFENesin (MUCINEX) 600 MG 12 hr tablet Take 600 mg by mouth at bedtime. 05/06/22  Yes [provider]  HYDROcodone-acetaminophen (NORCO/VICODIN) 5-325 MG tablet Take 1-2 tablets by mouth every 6 (six) hours as needed for moderate pain. 05/12/23  Yes Pabon, Diego F, MD  latanoprost (XALATAN) 0.005 %  ophthalmic solution Place 1 drop into both eyes at bedtime. 11/13/21  Yes [provider]  losartan (COZAAR) 100 MG tablet Take 100 mg by mouth every morning. 02/18/23  Yes [provider]  lovastatin (MEVACOR) 40 MG tablet Take 40 mg by mouth daily with supper. 11/10/21  Yes [provider]  montelukast (SINGULAIR) 10 MG tablet Take 1 tablet (10 mg total) by mouth at bedtime. 05/03/23  Yes [name], Makiera, MD  Multiple Vitamin (MULTIVITAMIN) tablet Take 1 tablet by mouth daily.   Yes [provider]  omeprazole (PRILOSEC) 40 MG capsule Take 40 mg by mouth at bedtime. 10/05/21  Yes [provider]  sertraline (ZOLOFT) 25 MG tablet Take 25 mg by mouth daily. 03/08/23  Yes [provider]  sertraline (ZOLOFT) 50 MG tablet TAKE 1 TABLET BY MOUTH EVERY DAY 11/23/22  Yes [name], Makiera, MD  simethicone (MYLICON) 80 MG chewable tablet Chew 80 mg by mouth as needed.   Yes [provider]  traZODone (DESYREL) 50 MG tablet Take 0.5-1 tablets (25-50 mg total) by mouth at bedtime as needed. 02/28/23  Yes [name], Tawanna Cooler, MD   Physical Exam: Vitals:   05/13/23 1320 05/13/23 1500 05/13/23 1530 05/13/23 1905  BP: (!) 141/78 (!) 175/83 (!) 176/80 (!) 211/103  Pulse: (!) 113 (!) 115 (!) 118   Resp: 16 (!) 27 (!) 29   Temp: 98.3 F (36.8 C)   98.4 F (36.9 C)  TempSrc: Oral   Oral  SpO2: 90% 96% 95%   Weight:    69.1 kg  Height:    5\' 4"  (1.626 m)   Constitutional: appears age-appropriate, NAD, calm Eyes: PERRL, lids and conjunctivae normal ENMT: Mucous membranes are moist. Posterior pharynx clear of any exudate or lesions. Age-appropriate dentition. Hearing appropriate Neck: normal, supple, no masses, no thyromegaly Respiratory: clear to auscultation bilaterally, no wheezing, no crackles. Normal respiratory effort. No accessory muscle use.  Cardiovascular: Regular rate and rhythm, no murmurs / rubs / gallops. No  extremity edema. 2+ pedal pulses. No carotid bruits.  Abdomen: + tenderness, no masses palpated, no hepatosplenomegaly. Bowel sounds positive.  Musculoskeletal: no clubbing / cyanosis. No joint deformity upper and lower extremities. Good ROM, no contractures, no atrophy. Normal muscle tone.  Skin: no rashes, lesions, ulcers. No induration. multiple laparoscopic scars consistent with recent cholecystectomy with JP drain in place.  JP drain had 5 to 10 ml of serosanguineous fluid Neurologic: Sensation intact. Strength 5/5 in all 4.  Psychiatric: Normal judgment and insight. Alert and oriented x 3. Normal mood.   EKG: independently reviewed, showing sinus tachycardia with rate of 114, QTc 458  Chest x-ray on Admission: I personally reviewed and I agree with radiologist reading as below.  CT ABDOMEN PELVIS W CONTRAST  Result Date: 05/13/2023 CLINICAL DATA:  Postop abdominal pain. One day postop from laparoscopic cholecystectomy. EXAM: CT ABDOMEN AND PELVIS WITH CONTRAST TECHNIQUE: Multidetector CT  imaging of the abdomen and pelvis was performed using the standard protocol following bolus administration of intravenous contrast. RADIATION DOSE REDUCTION: This exam was performed according to the departmental dose-optimization program which includes automated exposure control, adjustment of the mA and/or kV according to patient size and/or use of iterative reconstruction technique. CONTRAST:  80mL OMNIPAQUE IOHEXOL 300 MG/ML  SOLN COMPARISON:  03/16/2023 FINDINGS: Lower Chest: Mild atelectasis in both lower lobes. No evidence of basilar pneumothorax. Subcutaneous emphysema is seen in the chest wall soft tissues bilaterally in the inferior mediastinum, attributable to recent laparoscopic cholecystectomy. Hepatobiliary: No suspicious hepatic masses identified. Postop changes are seen from cholecystectomy. Mild biliary ductal dilatation is stable, and mild pneumobilia is also noted. Surgical drain is seen in the  anterior abdomen, and no abnormal fluid collections are seen. Pancreas:  No mass or inflammatory changes. Spleen: Within normal limits in size and appearance. Adrenals/Urinary Tract: No suspicious masses identified. No evidence of ureteral calculi or hydronephrosis. Stomach/Bowel: Small amount of free intraperitoneal air is seen, which is attributable to recent cholecystectomy. Subcutaneous emphysema is also seen within the abdominal wall soft tissues bilaterally no No evidence of abscess or free fluid. No evidence of bowel obstruction or focal inflammatory process. Moderate to large hiatal hernia is again seen. Wall thickening is again seen involving the distal thoracic esophagus, consistent with esophagitis. Diverticulosis is seen mainly involving the descending and sigmoid colon, however there is no evidence of diverticulitis. Vascular/Lymphatic: No pathologically enlarged lymph nodes. No acute vascular findings. Reproductive:  No mass or other significant abnormality. Other:  None. Musculoskeletal: No suspicious bone lesions identified. Multiple old lower thoracic and lumbar vertebral compression deformities are again seen. IMPRESSION: Small amount of free intraperitoneal air, and subcutaneous emphysema in the chest and abdominal wall soft tissues, attributable to recent laparoscopic cholecystectomy. No evidence of abscess or other complication. Stable mild biliary ductal dilatation. Moderate to large hiatal hernia, and esophagitis of the distal thoracic esophagus. Colonic diverticulosis, without radiographic evidence of diverticulitis. Electronically Signed   By: Danae Orleans M.D.   On: 05/13/2023 17:18   DG Chest Port 1 View  Result Date: 05/13/2023 CLINICAL DATA:  Questionable sepsis, evaluate for abnormality. EXAM: PORTABLE CHEST 1 VIEW COMPARISON:  None Available. FINDINGS: The heart size is normal. Atherosclerotic calcifications are present at the aortic arch. Asymmetric left lower lobe airspace  disease is present. The lung volumes are low. The right lung is clear. Asymmetric degenerative changes are present in the left shoulder. IMPRESSION: 1. Asymmetric left lower lobe airspace disease concerning for pneumonia. 2. Low lung volumes. Electronically Signed   By: Marin Roberts M.D.   On: 05/13/2023 14:54    Labs on Admission: I have personally reviewed following labs  CBC: Recent Labs  Lab 05/13/23 1430  WBC 20.2*  NEUTROABS 16.5*  HGB 11.8*  HCT 36.8  MCV 106.1*  PLT 240   Basic Metabolic Panel: Recent Labs  Lab 05/13/23 1334  NA 130*  K 4.0  CL 97*  CO2 24  GLUCOSE 143*  BUN 11  CREATININE 0.63  CALCIUM 9.9   GFR: Estimated Creatinine Clearance: 48.2 mL/min (by C-G formula based on SCr of 0.63 mg/dL).  Liver Function Tests: Recent Labs  Lab 05/13/23 1334  AST 58*  ALT 34  ALKPHOS 88  BILITOT 1.4*  PROT 7.1  ALBUMIN 3.0*   Recent Labs  Lab 05/13/23 1334  LIPASE 26   Coagulation Profile: Recent Labs  Lab 05/13/23 1334  INR 1.3*   Urine analysis:  Component Value Date/Time   COLORURINE YELLOW (A) 05/13/2023 1600   APPEARANCEUR CLEAR (A) 05/13/2023 1600   LABSPEC 1.016 05/13/2023 1600   PHURINE 6.0 05/13/2023 1600   GLUCOSEU NEGATIVE 05/13/2023 1600   HGBUR NEGATIVE 05/13/2023 1600   BILIRUBINUR NEGATIVE 05/13/2023 1600   BILIRUBINUR Trace 02/28/2023 1645   KETONESUR NEGATIVE 05/13/2023 1600   PROTEINUR NEGATIVE 05/13/2023 1600   UROBILINOGEN 0.2 02/28/2023 1645   NITRITE NEGATIVE 05/13/2023 1600   LEUKOCYTESUR NEGATIVE 05/13/2023 1600   This document was prepared using Dragon Voice Recognition software and may include unintentional dictation errors.  Dr. Sedalia Muta [facility]  If 7PM-7AM, please contact overnight-coverage provider If 7AM-7PM, please contact day attending provider [email or web address]  05/13/2023, 7:48 PM

## 2023-05-13 NOTE — Assessment & Plan Note (Signed)
Trazodone 25-50 mg p.o. nightly as needed for sleep resumed

## 2023-05-13 NOTE — ED Provider Notes (Signed)
[facility] Provider Note   Event Date/Time   First MD Initiated Contact with Patient 05/13/23 1314     (approximate) History  Code Sepsis (From [facility], was there for reassessment of drainage from laparoscopic surgery yesterday. Clinic noted low grade fever 99, tachycardia and tachypnea with BP 90/67)  HPI Melanie Carrillo is a 88 y.o. female who presents via EMS from clinic after she was found to have a temperature of 99, tachycardia, tachypnea, and a hypotensive episode of 90/67 1 day postop from laparoscopic cholecystectomy.  Patient states that she has been having persistent abdominal pain since her surgery that is 6/10 and is mildly relieved with her postop analgesics.  Patient states she was also started on antibiotics but does not know which 1 she was started on.  EMS noted that patient's JP drain has had decreased drainage per the clinic staff.  Patient denies any fever/chills ROS: Patient currently denies any vision changes, tinnitus, difficulty speaking, facial droop, sore throat, chest pain, shortness of breath, abdominal pain, nausea/vomiting/diarrhea, dysuria, or weakness/numbness/paresthesias in any extremity   Physical Exam  Triage Vital Signs: ED Triage Vitals [05/13/23 1320]  Encounter Vitals Group     BP (!) 141/78     Systolic BP Percentile      Diastolic BP Percentile      Pulse Rate (!) 113     Resp 16     Temp 98.3 F (36.8 C)     Temp Source Oral     SpO2 90 %     Weight      Height      Head Circumference      Peak Flow      Pain Score      Pain Loc      Pain Education      Exclude from Growth Chart    Most recent vital signs: Vitals:   05/15/23 2014 05/16/23 0434  BP: (!) 158/65 (!) 186/87  Pulse: 96   Resp: 18 20  Temp: 98.5 F (36.9 C) 98.4 F (36.9 C)  SpO2: 91% 91%   General: Awake, oriented x4. CV:  Good peripheral perfusion.  Resp:  Normal effort.  Abd:  No distention.  JP drain in place to the right  lower quadrant draining dark blood.  Multiple laparoscopic incision sites to left upper, left lower quadrants that only mild erythema surrounding Other:  Elderly overweight Caucasian female resting comfortably in no acute distress ED Results / Procedures / Treatments  Labs (all labs ordered are listed, but only abnormal results are displayed) Labs Reviewed  COMPREHENSIVE METABOLIC PANEL - Abnormal; Notable for the following components:      Result Value   Sodium 130 (*)    Chloride 97 (*)    Glucose, Bld 143 (*)    Albumin 3.0 (*)    AST 58 (*)    Total Bilirubin 1.4 (*)    All other components within normal limits  PROTIME-INR - Abnormal; Notable for the following components:   Prothrombin Time 16.4 (*)    INR 1.3 (*)    All other components within normal limits  URINALYSIS, W/ REFLEX TO CULTURE (INFECTION SUSPECTED) - Abnormal; Notable for the following components:   Color, Urine YELLOW (*)    APPearance CLEAR (*)    All other components within normal limits  CBC WITH DIFFERENTIAL/PLATELET - Abnormal; Notable for the following components:   WBC 20.2 (*)    RBC 3.47 (*)  Hemoglobin 11.8 (*)    MCV 106.1 (*)    Neutro Abs 16.5 (*)    Abs Immature Granulocytes 0.12 (*)    All other components within normal limits  BASIC METABOLIC PANEL - Abnormal; Notable for the following components:   Sodium 131 (*)    Chloride 97 (*)    BUN 7 (*)    All other components within normal limits  CBC - Abnormal; Notable for the following components:   WBC 18.3 (*)    RBC 3.16 (*)    Hemoglobin 10.7 (*)    HCT 32.6 (*)    MCV 103.2 (*)    All other components within normal limits  CBC - Abnormal; Notable for the following components:   RBC 2.88 (*)    Hemoglobin 10.0 (*)    HCT 29.6 (*)    MCV 102.8 (*)    MCH 34.7 (*)    All other components within normal limits  RESP PANEL BY RT-PCR (RSV, FLU A&B, COVID)  RVPGX2  CULTURE, BLOOD (ROUTINE X 2)  CULTURE, BLOOD (ROUTINE X 2)  MRSA  NEXT GEN BY PCR, NASAL  LACTIC ACID, PLASMA  LACTIC ACID, PLASMA  APTT  LIPASE, BLOOD  PROCALCITONIN  GLUCOSE, CAPILLARY   EKG ED ECG REPORT I, Merwyn Katos, the attending physician, personally viewed and interpreted this ECG. Date: 05/13/2023 EKG Time: 1327 Rate: 114 Rhythm: Tachycardic sinus rhythm QRS Axis: normal Intervals: normal ST/T Wave abnormalities: normal Narrative Interpretation: Tachycardic sinus rhythm.  No evidence of acute ischemia RADIOLOGY ED MD interpretation: One-view portable chest x-ray interpreted by me shows no evidence of acute abnormalities including no pneumonia, pneumothorax, or widened mediastinum -Agree with radiology assessment Official radiology report(s): No results found. PROCEDURES: Critical Care performed: No Procedures MEDICATIONS ORDERED IN ED: Medications  morphine (PF) 2 MG/ML injection 2 mg (2 mg Intravenous Given 05/14/23 1226)  fentaNYL (SUBLIMAZE) injection 25 mcg (has no administration in time range)  sodium chloride 0.9 % bolus 1,000 mL (0 mLs Intravenous Stopped 05/13/23 1621)  cefTRIAXone (ROCEPHIN) 2 g in sodium chloride 0.9 % 100 mL IVPB (0 g Intravenous Stopped 05/13/23 1429)  metroNIDAZOLE (FLAGYL) IVPB 500 mg (0 mg Intravenous Stopped 05/13/23 1707)  morphine (PF) 4 MG/ML injection 4 mg (4 mg Intravenous Given 05/13/23 1429)  ondansetron (ZOFRAN) injection 4 mg (4 mg Intravenous Given 05/13/23 1430)  iohexol (OMNIPAQUE) 300 MG/ML solution 80 mL (80 mLs Intravenous Contrast Given 05/13/23 1615)  morphine (PF) 4 MG/ML injection 4 mg (4 mg Intravenous Given 05/13/23 1830)  albumin human 25 % solution 25 g (0 g Intravenous Stopped 05/13/23 2037)  vancomycin (VANCOREADY) IVPB 1500 mg/300 mL (0 mg Intravenous Stopped 05/13/23 2100)  bisacodyl (DULCOLAX) suppository 10 mg (10 mg Rectal Given 05/16/23 1149)   IMPRESSION / MDM / ASSESSMENT AND PLAN / ED COURSE  I reviewed the triage vital signs and the nursing notes.                              The patient is on the cardiac monitor to evaluate for evidence of arrhythmia and/or significant heart rate changes. Patient's presentation is most consistent with acute presentation with potential threat to life or bodily function. Patient is a 88 year old female who presents postop day 2 from laparoscopic cholecystectomy complaining of persistent abdominal pain.  Care of this patient will be signed out to the oncoming physician at the end of my shift.  All pertinent patient  information conveyed and all questions answered.  All further care and disposition decisions will be made by the oncoming physician. Clinical Course as of 05/18/23 1528  Fri May 13, 2023  1419 ED EKG 12-Lead [EB]  1640 Spoke with general surgeon Dr. Everlene Farrier.  Reviewed CT which shows no acute postoperative pathology.  With her white count and persistent tachycardia does think she should continue with antibiotics and admission.  He asked [place] to admit to hospitalist service for this at this time as he has no further surgical intervention right now. [DW]    Clinical Course User Index [DW] Janith Lima, MD [EB] Merwyn Katos, MD   FINAL CLINICAL IMPRESSION(S) / ED DIAGNOSES   Final diagnoses:  Sepsis, due to unspecified organism, unspecified whether acute organ dysfunction present [facility])  Postoperative infection, unspecified type, initial encounter  S/P laparoscopic cholecystectomy   Rx / DC Orders   ED Discharge Orders          Ordered    cefUROXime (CEFTIN) 500 MG tablet  2 times daily with meals        05/16/23 1159    feeding supplement (ENSURE ENLIVE / ENSURE PLUS) LIQD  2 times daily between meals        05/16/23 1159    azithromycin (ZITHROMAX) 250 MG tablet        05/16/23 1159    Increase activity slowly        05/16/23 1159    Diet - low sodium heart healthy        05/16/23 1159           Note:  This document was prepared using Dragon voice recognition software and may include unintentional  dictation errors.   Merwyn Katos, MD 05/18/23 [phone]

## 2023-05-13 NOTE — Assessment & Plan Note (Signed)
Home PPI nightly resumed

## 2023-05-13 NOTE — Telephone Encounter (Signed)
Patient is having bloody drainage from around the drain. It is draining into the bulb also.

## 2023-05-13 NOTE — Assessment & Plan Note (Addendum)
Left lower lobe pneumonia With leukocytosis, increased heart rate, WBC of 20.2, organ involvement of pulmonary with oxygen desaturation to 89% on room air Check MRSA PCR screening on admission Blood cultures x 2 are in process Check procalcitonin on admission Suspect secondary to pneumonia Azithromycin 500 mg IV daily, ceftriaxone 2 g IV daily to complete 5-day course were ordered Incentive spirometry and flutter valve every 2 hours while awake Admit to stepdown, inpatient

## 2023-05-13 NOTE — Progress Notes (Signed)
Pt seen and examined again. CT pers reviewed and d/w radiologist, expected small amount of free air, no evidence of bowel injuries or abscess. NO peritonitis and drain serous. Persistent tachy. I do think she will benefit from hospitalization. We will be happy to follow but tachycardia and hypoxia may need to be w/u further. It might be related to prolonged GETA. D/w ER attneding and family

## 2023-05-13 NOTE — Telephone Encounter (Signed)
Patient had large bloody saturated area around the drain site and continues to leak blood when she coughs. Per Dr Everlene Farrier will come into the office to be seen today.

## 2023-05-13 NOTE — Progress Notes (Signed)
PHARMACY -  BRIEF ANTIBIOTIC NOTE   Pharmacy has received consult(s) for vancomycin from an ED provider.  The patient's profile has been reviewed for ht/wt/allergies/indication/available labs.    One time order(s) placed for vancomycin 1,500 mg IV x 1 dose  Further antibiotics/pharmacy consults should be ordered by admitting physician if indicated.                       Thank you, Merryl Hacker, Pharm Clinical Pharmacist 05/13/2023  5:21 PM

## 2023-05-13 NOTE — Patient Instructions (Signed)
Sinus Tachycardia  Sinus tachycardia is a fast heartbeat. In sinus tachycardia, the heart beats more than 100 times a minute. Sinus tachycardia starts in the part of the heart called the sinoatrial (SA) node. Sinus tachycardia may be harmless, or it may be a sign of a serious condition. What are the causes? This condition may be caused by: Exercise or exertion. A fever. Pain. Loss of body fluids (dehydration). Severe bleeding (hemorrhage). Anxiety and stress. Certain substances, including: Alcohol. Caffeine. Tobacco and nicotine products. Cold medicines. Illegal drugs. Medical conditions including: Heart disease. An infection. An overactive thyroid (hyperthyroidism). A lack of red blood cells (anemia). What are the signs or symptoms? Symptoms of this condition include: A feeling that the heart is beating fast or unevenly (palpitations). Suddenly noticing your heartbeat (cardiac awareness). Lightheadedness. Tiredness (fatigue). Shortness of breath. Chest pain. Nausea. Fainting. How is this diagnosed? This condition is diagnosed with: A physical exam. Tests or monitoring, such as: Blood tests. An electrocardiogram (ECG). This test measures the electrical activity of the heart. Ambulatory cardiac monitor. This records your heartbeats for 24 hours or more. You may be referred to a heart specialist (cardiologist). How is this treated? Treatment for this condition depends on the cause. Treatment may involve: Treating the underlying condition. Taking new medicines or changing your current medicines as told by your health care provider. Making changes to your diet or lifestyle. Follow these instructions at home: Lifestyle  Do not use any products that contain nicotine or tobacco. These products include cigarettes, chewing tobacco, and vaping devices, such as e-cigarettes. If you need help quitting, ask your health care provider. Do not use illegal drugs, such as  cocaine. Learn relaxation methods to help you when you get stressed or anxious. These include deep breathing. Avoid caffeine or other stimulants, including herbal stimulants that are found in energy drinks. Alcohol use  Do not drink alcohol if: Your health care provider tells you not to drink. You are pregnant, may be pregnant, or are planning to become pregnant. If you drink alcohol: Limit how much you have to: 0-1 drink a day for women. 0-2 drinks a day for men. Know how much alcohol is in your drink. In the [place] one drink equals one 12 oz bottle of beer (355 mL), one 5 oz glass of wine (148 mL), or one 1 oz glass of hard liquor (44 mL). General instructions Drink enough fluids to keep your urine pale yellow. Take over-the-counter and prescription medicines only as told by your health care provider. Ask your health care provider about taking vitamins, herbs, and supplements. Contact a health care provider if: You have vomiting or diarrhea that does not go away. You have a fever. You have weakness or dizziness. You feel faint. Get help right away if: You have pain in your chest, upper arms, jaw, or neck. You have palpitations that do not go away. Summary In sinus tachycardia, the heart beats more than 100 times a minute. Sinus tachycardia may be harmless, or it may be a sign of a serious condition. Treatment for this condition depends on the cause or the underlying condition. Get help right away if you have pain in your chest, upper arms, jaw, or neck. This information is not intended to replace advice given to you by your health care provider. Make sure you discuss any questions you have with your health care provider. Document Revised: 12/08/2021 Document Reviewed: 12/08/2021 Elsevier Patient Education  2024 [employer].

## 2023-05-13 NOTE — Progress Notes (Signed)
CODE SEPSIS - PHARMACY COMMUNICATION  **Broad Spectrum Antibiotics should be administered within 1 hour of Sepsis diagnosis**  Time Code Sepsis Called/Page Received: 9/20 @ 1328  Antibiotics Ordered: Ceftriaxone, metronidazole  Time of 1st antibiotic administration: 1357  Additional action taken by pharmacy: N/A  If necessary, Name of Provider/Nurse Contacted: N/A    Merryl Hacker ,PharmD Clinical Pharmacist  05/13/2023  1:33 PM

## 2023-05-13 NOTE — Telephone Encounter (Signed)
Patients daughter called and wanted a nurse to call her back. The patient has a JP drain and has some questions. She also stated that the dressing that is around the drain is bloody. Please call and advise   Gallbladder surgery on 05/12/23 by Dr Everlene Farrier

## 2023-05-13 NOTE — Assessment & Plan Note (Signed)
Flutter valve, incentive spirometry

## 2023-05-13 NOTE — Assessment & Plan Note (Signed)
-  Sertraline 75 mg daily resumed ?

## 2023-05-13 NOTE — ED Provider Notes (Signed)
  Physical Exam  BP (!) 176/80   Pulse (!) 118   Temp 98.3 F (36.8 C) (Oral)   Resp (!) 29   LMP  (LMP Unknown)   SpO2 95%   Physical Exam I have reviewed the vital signs. General:  Awake, alert, no acute distress. Head:  Normocephalic, Atraumatic. EENT:  PERRL, EOMI, Oral mucosa pink and moist, Neck is supple. Cardiovascular: Regular rate, 2+ distal pulses. Respiratory:  Normal respiratory effort, symmetrical expansion, no distress.   Extremities:  Moving all four extremities through full ROM without pain.   Neuro:  Alert and oriented.  Interacting appropriately.   Skin:  Warm, dry, no rash.   Psych: Appropriate affect.    Procedures  .Critical Care  Performed by: Janith Lima, MD Authorized by: Janith Lima, MD   Critical care provider statement:    Critical care time (minutes):  35   Critical care was necessary to treat or prevent imminent or life-threatening deterioration of the following conditions:  Sepsis   Critical care was time spent personally by me on the following activities:  Development of treatment plan with patient or surrogate, discussions with consultants, evaluation of patient's response to treatment, examination of patient, ordering and review of laboratory studies, ordering and review of radiographic studies, ordering and performing treatments and interventions, pulse oximetry, re-evaluation of patient's condition and review of old charts   Care discussed with: admitting provider     ED Course / MDM   Clinical Course as of 05/13/23 1653  Fri May 13, 2023  1419 ED EKG 12-Lead [EB]  1640 Spoke with general surgeon Dr. Everlene Farrier.  Reviewed CT which shows no acute postoperative pathology.  With her white count and persistent tachycardia does think she should continue with antibiotics and admission.  He asked [place] to admit to hospitalist service for this at this time as he has no further surgical intervention right now. [DW]    Clinical Course User Index [DW]  Janith Lima, MD [EB] Merwyn Katos, MD   Medical Decision Making Amount and/or Complexity of Data Reviewed Labs: ordered. Radiology: ordered. ECG/medicine tests: ordered. Decision-making details documented in ED Course.  Risk Prescription drug management. Decision regarding hospitalization.   Received patient in signout from Dr. Vicente Males.  88 year old female who is 1 day postop from a laparoscopic cholecystectomy presenting today for tachycardia, tachypnea, and hypotension.  Is having persistent abdominal pain with concern for decreased drain from her JP drain.  Seen in general surgery clinic and sent to the emergency department for further evaluation.  Laboratory workup notable for leukocytosis of 20.2 with notable abdominal pain.  Previous provider spoke with general surgery who is recommending CT with IV and oral contrast for further evaluation.  Patient signed out pending results of CT.  CT reviewed by general surgery who did not see obvious postoperative complication.  Given her ongoing tachycardia along with leukocytosis and abdominal pain, did recommend admission for IV antibiotics and continued fluid management.  Requested patient be admitted to hospitalist service.  Patient started on ceftriaxone and Flagyl.  Along with getting 1 L fluid in the emergency department.  Chest x-ray shows concern for pneumonia.  Patient was reassessed and now on 2 L nasal cannula after dropping to 89%.  Added on vancomycin to antibiotic regimen.  Admitted to hospitalist service for further care.      Janith Lima, MD 05/13/23 [phone]

## 2023-05-13 NOTE — Assessment & Plan Note (Signed)
Lovastatin 40 mg equivalent/pravastatin 40 mg daily resumed

## 2023-05-13 NOTE — Sepsis Progress Note (Signed)
Sepsis protocol is being followed by [facility].

## 2023-05-13 NOTE — Progress Notes (Signed)
Outpatient Surgical Follow Up  05/13/2023  Melanie Carrillo is an 88 y.o. female.   Chief Complaint  Patient presents with   Routine Post Op    Bleeding around drain    HPI: s/p very complex and difficult robotic GB yesterday. Comes in w leaky output from drain,I want to clarify that the output It is not actually sanguinous but rather serous w red tinge.certainly not bilious. Some pain and decrease in UO and dark urine, Decrease appetite. She did have a protein shake this am. No emesis She does c/o of pain around incisions  Past Medical History:  Diagnosis Date   Allergy    Anxiety    Arthritis    Calculus of gallbladder and bile duct with obstruction without cholecystitis    Cataract    Choledocholithiasis    Colon cancer (HCC)    Constipation    DDD (degenerative disc disease), lumbar    Depression    Dyspnea    GERD (gastroesophageal reflux disease)    Glaucoma    Hip fracture requiring operative repair (HCC) 2023   HLD (hyperlipidemia)    Hypertension    Interstitial lung disease (HCC)    Left hip postoperative wound infection    Obstructive sleep apnea 01/29/2014   No Cpap   Osteomyelitis (HCC)    Osteoporosis    Rhabdomyolysis    Shingles     Past Surgical History:  Procedure Laterality Date   APPENDECTOMY     CARDIAC CATHETERIZATION     COLON SURGERY     COLONOSCOPY     ENDOSCOPIC RETROGRADE CHOLANGIOPANCREATOGRAPHY (ERCP) WITH PROPOFOL N/A 04/28/2023   Procedure: ENDOSCOPIC RETROGRADE CHOLANGIOPANCREATOGRAPHY (ERCP) WITH PROPOFOL;  Surgeon: Midge Minium, MD;  Location: [facility] ENDOSCOPY;  Service: Endoscopy;  Laterality: N/A;   EYE SURGERY Bilateral    cataracts   FEMUR IM NAIL Left 04/28/2022   Procedure: REVISION FIXATION OF LEFT FEMUR FRACTURE;  Surgeon: Roby Lofts, MD;  Location: [facility] OR;  Service: Orthopedics;  Laterality: Left;   FRACTURE SURGERY     HARDWARE REMOVAL Left 04/28/2022   Procedure: HARDWARE REMOVAL;  Surgeon: Roby Lofts, MD;   Location: [facility] OR;  Service: Orthopedics;  Laterality: Left;   HERNIA REPAIR     I & D EXTREMITY Left 06/23/2022   Procedure: IRRIGATION AND DEBRIDEMENT LEFT HIP;  Surgeon: Roby Lofts, MD;  Location: [facility] OR;  Service: Orthopedics;  Laterality: Left;   I & D EXTREMITY Left 07/28/2022   Procedure: IRRIGATION AND DEBRIDEMENT HIP;  Surgeon: Roby Lofts, MD;  Location: [facility] OR;  Service: Orthopedics;  Laterality: Left;   INTRAMEDULLARY (IM) NAIL INTERTROCHANTERIC Left 12/26/2021   Procedure: INTRAMEDULLARY (IM) NAIL INTERTROCHANTRIC;  Surgeon: Ross Marcus, MD;  Location: [facility] ORS;  Service: Orthopedics;  Laterality: Left;   MULTIPLE TOOTH EXTRACTIONS     full dentures   REMOVAL OF STONES  04/28/2023   Procedure: REMOVAL OF STONES;  Surgeon: Midge Minium, MD;  Location: [facility] ENDOSCOPY;  Service: Endoscopy;;   shoulder  surgery Left    SPHINCTEROTOMY  04/28/2023   Procedure: SPHINCTEROTOMY;  Surgeon: Midge Minium, MD;  Location: [facility] ENDOSCOPY;  Service: Endoscopy;;   TONSILLECTOMY     TUBAL LIGATION     UPPER GI ENDOSCOPY     WRIST SURGERY Right     Family History  Problem Relation Age of Onset   Hypertension Mother    Heart disease Mother    Heart disease Father     Social History:  reports  that she has never smoked. She has never been exposed to tobacco smoke. She has never used smokeless tobacco. She reports that she does not drink alcohol and does not use drugs.  Allergies: No Known Allergies  Medications reviewed.    ROS Full ROS performed and is otherwise negative other than what is stated in HPI   BP 92/69 (BP Location: Left Arm, Patient Position: Sitting, Cuff Size: Small)   Pulse (!) 136   Temp 99.7 F (37.6 C) (Oral)   Ht 5\' 4"  (1.626 m)   Wt 148 lb (67.1 kg)   LMP  (LMP Unknown)   SpO2 92%   BMI 25.40 kg/m   Physical Exam Vitals reviewed.  Constitutional:      Appearance: Normal appearance.  Cardiovascular:     Rate and Rhythm: Tachycardia  present. Rhythm irregular.  Pulmonary:     Breath sounds: No stridor.  Abdominal:     Palpations: Abdomen is soft. There is no mass.     Tenderness: There is abdominal tenderness.     Comments: Incisional w/o infection appropriate incisional tenderness w/o peritonitis   Skin:    General: Skin is warm and dry.  Neurological:     General: No focal deficit present.     Mental Status: She is alert and oriented to person, place, and time.  Psychiatric:        Mood and Affect: Mood normal.        Behavior: Behavior normal.        Thought Content: Thought content normal.        Judgment: Judgment normal.     Assessment/Plan: Dehydration and possible a fib after recent surgery on fragile 88 yo.  I do think she needs hospitalization and further cardiac w/u, resuscitation and likely CT A/P to make sure we are not missing anything major. From her exam she does not appear to have complication related to her surgery ( No overt bleeding or bilious output to suggest bowel injury) . At this does not need emergent surgical intervention. We have called EMS as the daughter prefers to transport her mother this way. I will contact ER . She will likely benefit from admission to the hospitalist service for further cardiac w/u and renal w/u.    Sterling Big, MD [facility] General Surgeon

## 2023-05-13 NOTE — Hospital Course (Addendum)
Melanie Carrillo is a 88 year old female with history of GERD, depression, insomnia, with history of choledocholithiasis with chronic cholecystitis status post robotic assisted laparoscopic cholecystectomy on 05/12/2023.  Vitals in the ED showed temperature of 98.3, respiration rate of 16, heart rate of 136, blood pressure 92/69, SpO2 of 92% on room air.  Serum sodium is 130, potassium 4.0, chloride 97, bicarb 24, BUN of 11, serum creatinine of 0.63, EGFR greater than 60, nonfasting blood glucose 143, WBC 20.2, hemoglobin 11.8, platelets of 240.  AST was 58.  T. bili was 1.4. Lactic acid was 1.9.  COVID/influenza A/influenza B/RSV PCR were negative.  ED treatment: Morphine 4 mg IV x 2 doses ordered, 1 dose given, ondansetron 4 mg IV, ceftriaxone 2 g IV, sodium chloride 1 L bolus, vancomycin, Flagyl, LR 150 mL infusion.

## 2023-05-13 NOTE — Assessment & Plan Note (Signed)
Hydralazine 5 mg IV every 6 hours as needed for SBP greater 170, 4 days ordered

## 2023-05-14 DIAGNOSIS — R6520 Severe sepsis without septic shock: Secondary | ICD-10-CM | POA: Diagnosis not present

## 2023-05-14 DIAGNOSIS — A419 Sepsis, unspecified organism: Secondary | ICD-10-CM | POA: Diagnosis not present

## 2023-05-14 LAB — CBC
HCT: 32.6 % — ABNORMAL LOW (ref 36.0–46.0)
Hemoglobin: 10.7 g/dL — ABNORMAL LOW (ref 12.0–15.0)
MCH: 33.9 pg (ref 26.0–34.0)
MCHC: 32.8 g/dL (ref 30.0–36.0)
MCV: 103.2 fL — ABNORMAL HIGH (ref 80.0–100.0)
Platelets: 214 10*3/uL (ref 150–400)
RBC: 3.16 MIL/uL — ABNORMAL LOW (ref 3.87–5.11)
RDW: 12.6 % (ref 11.5–15.5)
WBC: 18.3 10*3/uL — ABNORMAL HIGH (ref 4.0–10.5)
nRBC: 0 % (ref 0.0–0.2)

## 2023-05-14 LAB — BASIC METABOLIC PANEL
Anion gap: 9 (ref 5–15)
BUN: 7 mg/dL — ABNORMAL LOW (ref 8–23)
CO2: 25 mmol/L (ref 22–32)
Calcium: 9.5 mg/dL (ref 8.9–10.3)
Chloride: 97 mmol/L — ABNORMAL LOW (ref 98–111)
Creatinine, Ser: 0.51 mg/dL (ref 0.44–1.00)
GFR, Estimated: >60 mL/min (ref >60)
Glucose, Bld: 98 mg/dL (ref 70–99)
Potassium: 3.9 mmol/L (ref 3.5–5.1)
Sodium: 131 mmol/L — ABNORMAL LOW (ref 135–145)

## 2023-05-14 MED ORDER — HYDROCODONE-ACETAMINOPHEN 5-325 MG PO TABS
1.0000 | ORAL_TABLET | Freq: Four times a day (QID) | ORAL | Status: DC | PRN
  Administered 2023-05-14 (×2): 1 via ORAL
  Administered 2023-05-15: 2 via ORAL
  Administered 2023-05-15: 1 via ORAL
  Administered 2023-05-15: 2 via ORAL
  Filled 2023-05-14: qty 2
  Filled 2023-05-14: qty 1
  Filled 2023-05-14 (×2): qty 2
  Filled 2023-05-14: qty 1

## 2023-05-14 NOTE — Progress Notes (Signed)
Patient transferred to room 207 by bed with Thayer Ohm, NT. Patient on tele monitor and alert with no distress noted when leaving ICU.

## 2023-05-14 NOTE — Progress Notes (Signed)
Pt arrival onto unit. Pt aox4, respirations even and unlabored on 3L of o2. Pt having some pain at this time but states she just received medication, see mar. Pt tachycardic @ 101 HR, all other vitals stable at this time. Pt eating dinner, tolerating well and has no complaints or concerns at this time

## 2023-05-14 NOTE — Progress Notes (Signed)
[facility] SUR[facility]ICAL PROGRESS NOTE  Hospital Day(s): 1.   Post op day(s):  .  2  Interval History: Patient seen and examined, no acute events or new complaints overnight. Patient reports feeling somewhat better, denying any abdominal pain.  Complains primarily of exacerbated left shoulder pain.  Review of Systems:  Constitutional: denies fever, chills  Cardiovascular: denies chest pain or palpitations  Gastrointestinal: denies abdominal pain, N/V, or diarrhea/and bowel function as per interval history Musculoskeletal: denies pain, decreased motor or sensation Integumentary: denies any other rashes or skin discolorations except  Vital signs in last 24 hours: [min-max] current  Temp:  [98.3 F (36.8 C)-99.7 F (37.6 C)] 98.8 F (37.1 C) (09/21 0400) Pulse Rate:  [94-136] 99 (09/21 0900) Resp:  [13-31] 18 (09/21 0900) BP: (92-211)/(69-103) 121/80 (09/21 0900) SpO2:  [90 %-97 %] 94 % (09/21 0900) Weight:  [67.1 kg-69.1 kg] 69.1 kg (09/20 1905)     Height: 5\' 4"  (162.6 cm) Weight: 69.1 kg BMI (Calculated): 26.14   Intake/Output last 2 shifts:  09/20 0701 - 09/21 0700 In: 1200 [IV Piggyback:1200] Out: 1300 [Urine:1300]   Physical Exam:  Constitutional: alert, cooperative and no distress  Respiratory: breathing non-labored at rest  Cardiovascular: regular rate and sinus rhythm  Gastrointestinal: soft, non-tender, and non-distended.  Incisions clean and dry, drain right upper quadrant with serosanguineous fluid in tubing and bulb.  Labs:     Latest Ref Rng & Units 05/14/2023    2:47 AM 05/13/2023    2:30 PM 02/28/2023    4:24 PM  CBC  WBC 4.0 - 10.5 K/uL 18.3  20.2  6.2   Hemoglobin 12.0 - 15.0 g/dL 40.9  81.1  91.4   Hematocrit 36.0 - 46.0 % 32.6  36.8  38.0   Platelets 150 - 400 K/uL 214  240  285       Latest Ref Rng & Units 05/14/2023    2:47 AM 05/13/2023    1:34 PM 02/28/2023    4:24 PM  CMP  Glucose 70 - 99 mg/dL 98  782  956   BUN 8 - 23 mg/dL 7  11   14    Creatinine 0.44 - 1.00 mg/dL 2.13  0.86  5.78   Sodium 135 - 145 mmol/L 131  130  137   Potassium 3.5 - 5.1 mmol/L 3.9  4.0  4.1   Chloride 98 - 111 mmol/L 97  97  104   CO2 22 - 32 mmol/L 25  24  21    Calcium 8.9 - 10.3 mg/dL 9.5  9.9  46.9   Total Protein 6.5 - 8.1 g/dL  7.1  7.3   Total Bilirubin 0.3 - 1.2 mg/dL  1.4  0.3   Alkaline Phos 38 - 126 U/L  88  79   AST 15 - 41 U/L  58  18   ALT 0 - 44 U/L  34  8      Imaging studies: No new pertinent imaging studies   Assessment/Plan:  88 y.o. female with left lower lobe pneumonia   s/p robotic cholecystectomy for cholecystitis, complicated by pertinent comorbidities including :  Patient Active Problem List   Diagnosis Date Noted   Severe sepsis (HCC) 05/13/2023   Choledocholithiasis with chronic cholecystitis 04/28/2023   Localized osteoarthritis of left knee 03/22/2023   Calculus of gallbladder and bile duct with obstruction without cholecystitis 03/22/2023   Lower abdominal pain 02/28/2023   Encounter for annual wellness exam in Medicare patient 11/26/2022  Postmenopausal estrogen deficiency 11/26/2022   At risk for pill esophagitis 08/10/2022   Left hip postoperative wound infection 07/28/2022   Adjustment insomnia 07/21/2022   Dyspnea and respiratory abnormality 07/21/2022   Other fatigue 07/21/2022   Osteomyelitis (HCC) 06/24/2022   Wound infection 06/23/2022   Loosening of intramedullary nail (HCC) 04/27/2022   Degenerative disk disease 04/27/2022   Hip fracture (HCC) 12/25/2021   Interstitial lung disease (HCC) 12/25/2021   Rhabdomyolysis 12/25/2021   Hyperlipidemia, mixed 09/07/2021   Obstructive sleep apnea 01/29/2014   GERD (gastroesophageal reflux disease) 12/26/2012   Depression 12/26/2012   Essential hypertension 11/07/2002    -Appreciate hospitalist admission.  -Will follow along her continued postoperative progress.  -No evidence of acute intraperitoneal pathology.  -Continue IV antibiotics and  oxygen support.   -- Campbell Lerner, M.D., [facility] 05/14/2023

## 2023-05-14 NOTE — Progress Notes (Signed)
[facility]  - [place] at [facility]   PATIENT NAME: Melanie Carrillo    MR#:  [id number]  DATE OF BIRTH:  1935/04/07  SUBJECTIVE:  daughter at bedside. Patient apparently came in with abdominal pain. She is status post robotic assisted laparoscopic cholecystectomy on 05/12/2023 as outpatient. Tolerating liquid diet. Patient has pain over the surgical site. No respiratory distress stress sad stable. No fever.    VITALS:  Blood pressure 106/79, pulse 100, temperature 98.4 F (36.9 C), temperature source Axillary, resp. rate 20, height 5\' 4"  (1.626 m), weight 69.1 kg, SpO2 95%.  PHYSICAL EXAMINATION:   GENERAL:  88 y.o.-year-old patient with no acute distress.  LUNGS: decreased breath sounds bilaterally, no wheezing CARDIOVASCULAR: S1, S2 normal. No murmur   ABDOMEN: Soft, nontender, nondistended. Surgical incisions present. Drain right upper quadrant present EXTREMITIES: No  edema b/l.    NEUROLOGIC: nonfocal  patient is alert and awake  LABORATORY PANEL:  CBC Recent Labs  Lab 05/14/23 0247  WBC 18.3*  HGB 10.7*  HCT 32.6*  PLT 214    Chemistries  Recent Labs  Lab 05/13/23 1334 05/14/23 0247  NA 130* 131*  K 4.0 3.9  CL 97* 97*  CO2 24 25  GLUCOSE 143* 98  BUN 11 7*  CREATININE 0.63 0.51  CALCIUM 9.9 9.5  AST 58*  --   ALT 34  --   ALKPHOS 88  --   BILITOT 1.4*  --    Cardiac Enzymes No results for input(s): "TROPONINI" in the last 168 hours. RADIOLOGY:  CT ABDOMEN PELVIS W CONTRAST  Result Date: 05/13/2023 CLINICAL DATA:  Postop abdominal pain. One day postop from laparoscopic cholecystectomy. EXAM: CT ABDOMEN AND PELVIS WITH CONTRAST TECHNIQUE: Multidetector CT imaging of the abdomen and pelvis was performed using the standard protocol following bolus administration of intravenous contrast. RADIATION DOSE REDUCTION: This exam was performed according to the departmental dose-optimization program which includes automated exposure control,  adjustment of the mA and/or kV according to patient size and/or use of iterative reconstruction technique. CONTRAST:  80mL OMNIPAQUE IOHEXOL 300 MG/ML  SOLN COMPARISON:  03/16/2023 FINDINGS: Lower Chest: Mild atelectasis in both lower lobes. No evidence of basilar pneumothorax. Subcutaneous emphysema is seen in the chest wall soft tissues bilaterally in the inferior mediastinum, attributable to recent laparoscopic cholecystectomy. Hepatobiliary: No suspicious hepatic masses identified. Postop changes are seen from cholecystectomy. Mild biliary ductal dilatation is stable, and mild pneumobilia is also noted. Surgical drain is seen in the anterior abdomen, and no abnormal fluid collections are seen. Pancreas:  No mass or inflammatory changes. Spleen: Within normal limits in size and appearance. Adrenals/Urinary Tract: No suspicious masses identified. No evidence of ureteral calculi or hydronephrosis. Stomach/Bowel: Small amount of free intraperitoneal air is seen, which is attributable to recent cholecystectomy. Subcutaneous emphysema is also seen within the abdominal wall soft tissues bilaterally no No evidence of abscess or free fluid. No evidence of bowel obstruction or focal inflammatory process. Moderate to large hiatal hernia is again seen. Wall thickening is again seen involving the distal thoracic esophagus, consistent with esophagitis. Diverticulosis is seen mainly involving the descending and sigmoid colon, however there is no evidence of diverticulitis. Vascular/Lymphatic: No pathologically enlarged lymph nodes. No acute vascular findings. Reproductive:  No mass or other significant abnormality. Other:  None. Musculoskeletal: No suspicious bone lesions identified. Multiple old lower thoracic and lumbar vertebral compression deformities are again seen. IMPRESSION: Small amount of free intraperitoneal air, and subcutaneous emphysema in the chest and abdominal  wall soft tissues, attributable to recent  laparoscopic cholecystectomy. No evidence of abscess or other complication. Stable mild biliary ductal dilatation. Moderate to large hiatal hernia, and esophagitis of the distal thoracic esophagus. Colonic diverticulosis, without radiographic evidence of diverticulitis. Electronically Signed   By: Danae Orleans M.D.   On: 05/13/2023 17:18   DG Chest Port 1 View  Result Date: 05/13/2023 CLINICAL DATA:  Questionable sepsis, evaluate for abnormality. EXAM: PORTABLE CHEST 1 VIEW COMPARISON:  None Available. FINDINGS: The heart size is normal. Atherosclerotic calcifications are present at the aortic arch. Asymmetric left lower lobe airspace disease is present. The lung volumes are low. The right lung is clear. Asymmetric degenerative changes are present in the left shoulder. IMPRESSION: 1. Asymmetric left lower lobe airspace disease concerning for pneumonia. 2. Low lung volumes. Electronically Signed   By: Marin Roberts M.D.   On: 05/13/2023 14:54    Assessment and Plan  Melanie Carrillo is a 88 year old female with history of GERD, depression, insomnia, with history of choledocholithiasis with chronic cholecystitis status post robotic assisted laparoscopic cholecystectomy on 05/12/2023.  She reports that she was sent to the ED by her general surgeon due to her worsening weakness and concerns for dehydration and abdominal pain.  She endorses generalized abdominal pain and weakness.  She denies fever, nausea, vomiting, chest pain, shortness of breath  Severe sepsis (HCC) Left lower lobe pneumonia versus atelectasis ---With leukocytosis, increased heart rate, WBC of 20.2, organ involvement of pulmonary with oxygen desaturation to 89% on room air -- MRI said PCR negative DC vancomycin -- Pro calcitonin 0.45 -- empiric Rocephin and azithromycin -- some of the leukocytosis could be due to inflammatory changes with recent laparoscopic cholecystectomy -- incentive spirometer -- white count trending down 20  K-- 18 K  Recent laparoscopic cholecystectomy -- followed by general surgery -- CT of the abdomen pelvis shows changes which are surgery related. No other acute changes  Hyperlipidemia, mixed Statins  GERD (gastroesophageal reflux disease) Home PPI nightly resumed   Essential hypertension PRN hydralazineInterstitial lung disease (HCC) Flutter valve, incentive spirometry   Adjustment insomnia Trazodone    Depression Sertraline     DVT prophylaxis: Heparin 5000 units subcutaneous every 8 hours Code Status: Full code Diet: Chopped diet Family Communication: Updated grand niece in law at bedside with patient's permission Disposition Plan: Pending clinical course Consults called: General Surgery  Procedures: Family communication : Consults : CODE STATUS:  DVT Prophylaxis : Level of care: Med-Surg Status is: Inpatient {Inpatient:23812}    TOTAL TIME TAKING CARE OF THIS PATIENT: *** minutes.  >50% time spent on counselling and coordination of care  Note: This dictation was prepared with Dragon dictation along with smaller phrase technology. Any transcriptional errors that result from this process are unintentional.  Enedina Finner M.D    [facility]   CC: Primary care physician; Ronnald Ramp, MD

## 2023-05-15 DIAGNOSIS — A419 Sepsis, unspecified organism: Secondary | ICD-10-CM | POA: Diagnosis not present

## 2023-05-15 DIAGNOSIS — R6520 Severe sepsis without septic shock: Secondary | ICD-10-CM

## 2023-05-15 MED ORDER — ENSURE ENLIVE PO LIQD
237.0000 mL | Freq: Two times a day (BID) | ORAL | Status: DC
  Administered 2023-05-15 – 2023-05-16 (×4): 237 mL via ORAL

## 2023-05-15 MED ORDER — POLYETHYLENE GLYCOL 3350 17 G PO PACK
17.0000 g | PACK | Freq: Every day | ORAL | Status: DC
  Administered 2023-05-15 – 2023-05-16 (×2): 17 g via ORAL
  Filled 2023-05-15 (×2): qty 1

## 2023-05-15 MED ORDER — AZITHROMYCIN 250 MG PO TABS
500.0000 mg | ORAL_TABLET | Freq: Every day | ORAL | Status: DC
  Administered 2023-05-15: 500 mg via ORAL
  Filled 2023-05-15: qty 2

## 2023-05-15 NOTE — Evaluation (Signed)
Physical Therapy Evaluation Patient Details Name: Melanie Carrillo MRN: [id number] DOB: 04-Feb-1935 Today's Date: 05/15/2023  History of Present Illness  88 y/o female presented to ED on 05/13/23 for tachycardia, tachypnea, and hypotension after laparoscopic cholecystectomy on 9/19. CXR concerning for PNA. Admitted for severe sepsis. PMH: hypertension, GERD, interstitial lung disease.  Clinical Impression  Patient admitted with the above. PTA, patient lives with daughter and was independent with RW. Daughter works from home and is available as needed. Patient presents with weakness, impaired balance, and decreased activity tolerance. Required 2L O2 to maintain sats >90% throughout session with spO2 dropping as low as 85% on RA with mobility. Requires CGA for safety at this time with RW. Patient will benefit from skilled PT services during acute stay to address listed deficits. Patient will benefit from ongoing therapy at discharge to maximize functional independence and safety.         If plan is discharge home, recommend the following: A little help with walking and/or transfers;A little help with bathing/dressing/bathroom;Assistance with cooking/housework;Assist for transportation   Can travel by private vehicle        Equipment Recommendations None recommended by PT  Recommendations for Other Services       Functional Status Assessment Patient has had a recent decline in their functional status and demonstrates the ability to make significant improvements in function in a reasonable and predictable amount of time.     Precautions / Restrictions Precautions Precautions: Fall Precaution Comments: JP drain Restrictions Weight Bearing Restrictions: No      Mobility  Bed Mobility Overal bed mobility: Needs Assistance Bed Mobility: Supine to Sit, Sit to Supine     Supine to sit: Contact guard Sit to supine: Contact guard assist        Transfers Overall transfer level: Needs  assistance Equipment used: Rolling Orelia Brandstetter (2 wheels) Transfers: Sit to/from Stand Sit to Stand: Contact guard assist                Ambulation/Gait Ambulation/Gait assistance: Contact guard assist Gait Distance (Feet): 35 Feet Assistive device: Rolling Wenona [place] (2 wheels) Gait Pattern/deviations: Step-through pattern, Decreased stride length Gait velocity: decreased     General Gait Details: CGA for safety. On RA, spO2 drops to 85%. increased to 2L, spO2 >92%  Stairs            Wheelchair Mobility     Tilt Bed    Modified Rankin (Stroke Patients Only)       Balance Overall balance assessment: Needs assistance Sitting-balance support: Feet supported Sitting balance-Leahy Scale: Good     Standing balance support: Reliant on assistive device for balance, During functional activity, Bilateral upper extremity supported Standing balance-Leahy Scale: Fair                               Pertinent Vitals/Pain Pain Assessment Pain Assessment: No/denies pain    Home Living Family/patient expects to be discharged to:: Private residence Living Arrangements: Children Available Help at Discharge: Family;Available 24 hours/day Type of Home: House Home Access: Ramped entrance       Home Layout: One level Home Equipment: Agricultural consultant (2 wheels);Rollator (4 wheels);Cane - single point;BSC/3in1;Wheelchair - manual;Hospital bed;Other (comment);Adaptive equipment;Lift chair Additional Comments: Pt lives with family and able to provide 24/7 care. Pt's daughter is a Engineer, civil (consulting).    Prior Function Prior Level of Function : Independent/Modified Independent  Mobility Comments: ambulatory with RW ADLs Comments: Pt reports performing ADLs independently and daughter assist with IADLs     Extremity/Trunk Assessment   Upper Extremity Assessment Upper Extremity Assessment: Generalized weakness    Lower Extremity Assessment Lower Extremity  Assessment: Generalized weakness       Communication   Communication Communication: Hearing impairment  Cognition Arousal: Alert Behavior During Therapy: WFL for tasks assessed/performed Overall Cognitive Status: Within Functional Limits for tasks assessed                                          General Comments      Exercises     Assessment/Plan    PT Assessment Patient needs continued PT services  PT Problem List Decreased strength;Decreased balance;Decreased activity tolerance;Decreased mobility;Cardiopulmonary status limiting activity       PT Treatment Interventions Gait training;DME instruction;Functional mobility training;Therapeutic activities;Therapeutic exercise;Balance training;Patient/family education    PT Goals (Current goals can be found in the Care Plan section)  Acute Rehab PT Goals Patient Stated Goal: to go home PT Goal Formulation: With patient Time For Goal Achievement: 05/29/23 Potential to Achieve Goals: Good    Frequency Min 1X/week     Co-evaluation               AM-PAC PT "6 Clicks" Mobility  Outcome Measure Help needed turning from your back to your side while in a flat bed without using bedrails?: A Little Help needed moving from lying on your back to sitting on the side of a flat bed without using bedrails?: A Little Help needed moving to and from a bed to a chair (including a wheelchair)?: A Little Help needed standing up from a chair using your arms (e.g., wheelchair or bedside chair)?: A Little Help needed to walk in hospital room?: A Little Help needed climbing 3-5 steps with a railing? : A Lot 6 Click Score: 17    End of Session Equipment Utilized During Treatment: Oxygen Activity Tolerance: Patient tolerated treatment well Patient left: in bed;with call bell/phone within reach;with bed alarm set Nurse Communication: Mobility status PT Visit Diagnosis: Unsteadiness on feet (R26.81);Muscle weakness  (generalized) (M62.81)    Time: 5102-5852 PT Time Calculation (min) (ACUTE ONLY): 18 min   Charges:   PT Evaluation $PT Eval Moderate Complexity: 1 Mod   PT General Charges $$ ACUTE PT VISIT: 1 Visit         Maylon Peppers, PT, DPT Physical Therapist - [facility]  [facility]   Berl Bonfanti A Jacquelyn Shadrick 05/15/2023, 1:27 PM

## 2023-05-15 NOTE — Progress Notes (Signed)
[facility] SURGICAL PROGRESS NOTE  Hospital Day(s): 2.   Post op day(s):  .  2  Interval History: Patient seen and examined, no acute events or new complaints overnight. Patient reports feeling somewhat better, denying any abdominal pain.  Daughter at bedside, appetite still marginal.  Oxygen weaning down.  No recent fevers or chills.  No WBC today, anticipating repeat tomorrow.  Review of Systems:  Constitutional: denies fever, chills  Cardiovascular: denies chest pain or palpitations  Gastrointestinal: denies abdominal pain, N/V, or diarrhea/and bowel function as per interval history Musculoskeletal: denies pain, decreased motor or sensation Integumentary: denies any other rashes or skin discolorations except  Vital signs in last 24 hours: [min-max] current  Temp:  [97.5 F (36.4 C)-98.4 F (36.9 C)] 98.2 F (36.8 C) (09/22 0902) Pulse Rate:  [85-104] 91 (09/22 0902) Resp:  [15-21] 20 (09/22 0902) BP: (95-127)/(77-96) 109/90 (09/22 0902) SpO2:  [92 %-100 %] 100 % (09/22 0902)     Height: 5\' 4"  (162.6 cm) Weight: 69.1 kg BMI (Calculated): 26.14   Intake/Output last 2 shifts:  09/21 0701 - 09/22 0700 In: 1983.9 [P.O.:320; I.V.:1323.2; IV Piggyback:340.7] Out: 2235 [Urine:2150; Drains:85]   Physical Exam:  Constitutional: alert, cooperative and no distress  Respiratory: breathing non-labored at rest , oxygen down to 2 L/min nasal cannula. Cardiovascular: regular rate and sinus rhythm  Gastrointestinal: soft, non-tender, and non-distended.  Incisions clean and dry, drain right upper quadrant with serous fluid in tubing and bulb.  Labs:     Latest Ref Rng & Units 05/14/2023    2:47 AM 05/13/2023    2:30 PM 02/28/2023    4:24 PM  CBC  WBC 4.0 - 10.5 K/uL 18.3  20.2  6.2   Hemoglobin 12.0 - 15.0 g/dL 40.9  81.1  91.4   Hematocrit 36.0 - 46.0 % 32.6  36.8  38.0   Platelets 150 - 400 K/uL 214  240  285       Latest Ref Rng & Units 05/14/2023    2:47 AM  05/13/2023    1:34 PM 02/28/2023    4:24 PM  CMP  Glucose 70 - 99 mg/dL 98  782  956   BUN 8 - 23 mg/dL 7  11  14    Creatinine 0.44 - 1.00 mg/dL 2.13  0.86  5.78   Sodium 135 - 145 mmol/L 131  130  137   Potassium 3.5 - 5.1 mmol/L 3.9  4.0  4.1   Chloride 98 - 111 mmol/L 97  97  104   CO2 22 - 32 mmol/L 25  24  21    Calcium 8.9 - 10.3 mg/dL 9.5  9.9  46.9   Total Protein 6.5 - 8.1 g/dL  7.1  7.3   Total Bilirubin 0.3 - 1.2 mg/dL  1.4  0.3   Alkaline Phos 38 - 126 U/L  88  79   AST 15 - 41 U/L  58  18   ALT 0 - 44 U/L  34  8      Imaging studies: No new pertinent imaging studies   Assessment/Plan:  88 y.o. female with left lower lobe pneumonia   s/p robotic cholecystectomy for cholecystitis, complicated by pertinent comorbidities including :  Patient Active Problem List   Diagnosis Date Noted   Severe sepsis (HCC) 05/13/2023   Choledocholithiasis with chronic cholecystitis 04/28/2023   Localized osteoarthritis of left knee 03/22/2023   Calculus of gallbladder and bile duct with obstruction without cholecystitis 03/22/2023  Lower abdominal pain 02/28/2023   Encounter for annual wellness exam in Medicare patient 11/26/2022   Postmenopausal estrogen deficiency 11/26/2022   At risk for pill esophagitis 08/10/2022   Left hip postoperative wound infection 07/28/2022   Adjustment insomnia 07/21/2022   Dyspnea and respiratory abnormality 07/21/2022   Other fatigue 07/21/2022   Osteomyelitis (HCC) 06/24/2022   Wound infection 06/23/2022   Loosening of intramedullary nail (HCC) 04/27/2022   Degenerative disk disease 04/27/2022   Hip fracture (HCC) 12/25/2021   Interstitial lung disease (HCC) 12/25/2021   Rhabdomyolysis 12/25/2021   Hyperlipidemia, mixed 09/07/2021   Obstructive sleep apnea 01/29/2014   GERD (gastroesophageal reflux disease) 12/26/2012   Depression 12/26/2012   Essential hypertension 11/07/2002    -Appreciate hospitalist admission.  -Will follow along her  continued postoperative progress.  -No evidence of acute intraperitoneal pathology.  -Continue IV antibiotics and oxygen support.  Wean O2 support, follow-up WBC in AM.   -- Campbell Lerner, M.D., [facility] 05/15/2023

## 2023-05-15 NOTE — Evaluation (Signed)
Occupational Therapy Evaluation Patient Details Name: Melanie Carrillo MRN: [id number] DOB: 1934/09/11 Today's Date: 05/15/2023   History of Present Illness 88 year old female with history of GERD, depression, insomnia, with history of choledocholithiasis with chronic cholecystitis status post robotic assisted laparoscopic cholecystectomy on 05/12/2023.   Clinical Impression   Patient presenting with decreased Ind in self care,balance, functional mobility/transfers, endurance, and safety awareness. Patient reports living at home with daughter who works from home and is available to assist as needed. Pt endorses ambulation with RW within the home and being Ind with self care tasks. Daughter assists with IADLs. Pt performs bed mobility with min A to come to EOB and stands with min A and use of RW. She ambulates with CGA to bathroom for toileting needs. Min guard for standing balance with clothing management and hygiene. Min A to stand from commode and returns back to bed in same manner. O2 saturation on RA decreased to 81% and placed on 2Ls for her to recover to 94%. All needs within reach.  Patient will benefit from acute OT to increase overall independence in the areas of ADLs, functional mobility, and safety awareness in order to safely discharge.      If plan is discharge home, recommend the following: A little help with walking and/or transfers;A little help with bathing/dressing/bathroom;Assistance with cooking/housework;Assist for transportation;Help with stairs or ramp for entrance    Functional Status Assessment  Patient has had a recent decline in their functional status and demonstrates the ability to make significant improvements in function in a reasonable and predictable amount of time.  Equipment Recommendations  None recommended by OT       Precautions / Restrictions Precautions Precautions: Fall Precaution Comments: JP drain      Mobility Bed Mobility Overal bed mobility:  Needs Assistance Bed Mobility: Supine to Sit, Sit to Supine     Supine to sit: Min assist Sit to supine: Contact guard assist        Transfers Overall transfer level: Needs assistance Equipment used: Rolling walker (2 wheels) Transfers: Sit to/from Stand Sit to Stand: Min assist                  Balance Overall balance assessment: Needs assistance Sitting-balance support: Feet supported Sitting balance-Leahy Scale: Good     Standing balance support: Reliant on assistive device for balance, During functional activity, Bilateral upper extremity supported Standing balance-Leahy Scale: Fair                             ADL either performed or assessed with clinical judgement   ADL Overall ADL's : Needs assistance/impaired     Grooming: Wash/dry hands;Standing;Supervision/safety                   Toilet Transfer: Minimal assistance;Regular Toilet;Rolling walker (2 wheels)   Toileting- Clothing Manipulation and Hygiene: Sit to/from stand;Contact guard assist       Functional mobility during ADLs: Contact guard assist;Rolling walker (2 wheels)       Vision Patient Visual Report: No change from baseline              Pertinent Vitals/Pain Pain Assessment Pain Assessment: No/denies pain     Extremity/Trunk Assessment Upper Extremity Assessment Upper Extremity Assessment: Generalized weakness   Lower Extremity Assessment Lower Extremity Assessment: Generalized weakness       Communication Communication Communication: Hearing impairment   Cognition Arousal: Alert Behavior During Therapy: [facility] for  tasks assessed/performed Overall Cognitive Status: Within Functional Limits for tasks assessed                                 General Comments: Pt is very pleasant and cooperative                Home Living Family/patient expects to be discharged to:: Private residence Living Arrangements: Children Available Help at  Discharge: Family;Available 24 hours/day Type of Home: House Home Access: Ramped entrance     Home Layout: One level     Bathroom Shower/Tub: Producer, television/film/video: Standard     Home Equipment: Agricultural consultant (2 wheels);Rollator (4 wheels);Cane - single point;BSC/3in1;Wheelchair - manual;Hospital bed;Other (comment);Adaptive equipment;Lift chair Adaptive Equipment: Long-handled sponge;Reacher Additional Comments: Pt lives with family and able to provide 24/7 care. Pt's daughter is a Engineer, civil (consulting).      Prior Functioning/Environment Prior Level of Function : Independent/Modified Independent             Mobility Comments: ambulatory with RW ADLs Comments: Pt reports performing ADLs independently and daughter assist with IADLs        OT Problem List: Decreased strength;Decreased activity tolerance;Decreased safety awareness;Impaired balance (sitting and/or standing);Decreased knowledge of use of DME or AE      OT Treatment/Interventions: Self-care/ADL training;Therapeutic exercise;Therapeutic activities;Energy conservation;DME and/or AE instruction;Patient/family education;Balance training    OT Goals(Current goals can be found in the care plan section) Acute Rehab OT Goals Patient Stated Goal: to go home with daughter OT Goal Formulation: With patient Time For Goal Achievement: 05/29/23 Potential to Achieve Goals: Fair ADL Goals Pt Will Perform Grooming: with modified independence;standing Pt Will Perform Lower Body Dressing: with modified independence;sit to/from stand Pt Will Transfer to Toilet: with modified independence;ambulating Pt Will Perform Toileting - Clothing Manipulation and hygiene: with modified independence;sit to/from stand  OT Frequency: Min 1X/week       AM-PAC OT "6 Clicks" Daily Activity     Outcome Measure Help from another person eating meals?: None Help from another person taking care of personal grooming?: None Help from another person  toileting, which includes using toliet, bedpan, or urinal?: A Little Help from another person bathing (including washing, rinsing, drying)?: A Little Help from another person to put on and taking off regular upper body clothing?: None Help from another person to put on and taking off regular lower body clothing?: A Little 6 Click Score: 21   End of Session Equipment Utilized During Treatment: Rolling walker (2 wheels) Nurse Communication: Mobility status;Other (comment) (Pt on 2Ls via [place] and purewick removed)  Activity Tolerance: Patient tolerated treatment well Patient left: in bed;with call bell/phone within reach;with bed alarm set  OT Visit Diagnosis: Unsteadiness on feet (R26.81);Repeated falls (R29.6);Muscle weakness (generalized) (M62.81)                Time: 4098-1191 OT Time Calculation (min): 23 min Charges:  OT General Charges $OT Visit: 1 Visit OT Evaluation $OT Eval Low Complexity: 1 Low OT Treatments $Self Care/Home Management : 8-22 mins  Jackquline Denmark, MS, OTR/L , CBIS ascom [phone]  05/15/23, 11:57 AM

## 2023-05-15 NOTE — Progress Notes (Signed)
[facility]  - [place] at [facility]   PATIENT NAME: Melanie Carrillo    MR#:  [id number]  DATE OF BIRTH:  May 24, 1935  SUBJECTIVE:  patient seen earlier in the morning no family at bedside.  Patient did work with physical therapy she got up to sit in the chair. Walked in the room up to the bathroom. Weaned to room air. Has some pain around the surgical incision which is healing well. Encouraged to increase PO intake   VITALS:  Blood pressure 99/84, pulse 85, temperature (!) 97.5 F (36.4 C), temperature source Oral, resp. rate 17, height 5\' 4"  (1.626 m), weight 69.1 kg, SpO2 100%.  PHYSICAL EXAMINATION:   GENERAL:  88 y.o.-year-old patient with no acute distress.  LUNGS: decreased breath sounds bilaterally, no wheezing CARDIOVASCULAR: S1, S2 normal. No murmur   ABDOMEN: Soft, nontender, nondistended. Surgical incisions present. Drain right upper quadrant present EXTREMITIES: No  edema b/l.    NEUROLOGIC: nonfocal  patient is alert and awake  LABORATORY PANEL:  CBC Recent Labs  Lab 05/14/23 0247  WBC 18.3*  HGB 10.7*  HCT 32.6*  PLT 214    Chemistries  Recent Labs  Lab 05/13/23 1334 05/14/23 0247  NA 130* 131*  K 4.0 3.9  CL 97* 97*  CO2 24 25  GLUCOSE 143* 98  BUN 11 7*  CREATININE 0.63 0.51  CALCIUM 9.9 9.5  AST 58*  --   ALT 34  --   ALKPHOS 88  --   BILITOT 1.4*  --    Cardiac Enzymes No results for input(s): "TROPONINI" in the last 168 hours. RADIOLOGY:  CT ABDOMEN PELVIS W CONTRAST  Result Date: 05/13/2023 CLINICAL DATA:  Postop abdominal pain. One day postop from laparoscopic cholecystectomy. EXAM: CT ABDOMEN AND PELVIS WITH CONTRAST TECHNIQUE: Multidetector CT imaging of the abdomen and pelvis was performed using the standard protocol following bolus administration of intravenous contrast. RADIATION DOSE REDUCTION: This exam was performed according to the departmental dose-optimization program which includes automated exposure control,  adjustment of the mA and/or kV according to patient size and/or use of iterative reconstruction technique. CONTRAST:  80mL OMNIPAQUE IOHEXOL 300 MG/ML  SOLN COMPARISON:  03/16/2023 FINDINGS: Lower Chest: Mild atelectasis in both lower lobes. No evidence of basilar pneumothorax. Subcutaneous emphysema is seen in the chest wall soft tissues bilaterally in the inferior mediastinum, attributable to recent laparoscopic cholecystectomy. Hepatobiliary: No suspicious hepatic masses identified. Postop changes are seen from cholecystectomy. Mild biliary ductal dilatation is stable, and mild pneumobilia is also noted. Surgical drain is seen in the anterior abdomen, and no abnormal fluid collections are seen. Pancreas:  No mass or inflammatory changes. Spleen: Within normal limits in size and appearance. Adrenals/Urinary Tract: No suspicious masses identified. No evidence of ureteral calculi or hydronephrosis. Stomach/Bowel: Small amount of free intraperitoneal air is seen, which is attributable to recent cholecystectomy. Subcutaneous emphysema is also seen within the abdominal wall soft tissues bilaterally no No evidence of abscess or free fluid. No evidence of bowel obstruction or focal inflammatory process. Moderate to large hiatal hernia is again seen. Wall thickening is again seen involving the distal thoracic esophagus, consistent with esophagitis. Diverticulosis is seen mainly involving the descending and sigmoid colon, however there is no evidence of diverticulitis. Vascular/Lymphatic: No pathologically enlarged lymph nodes. No acute vascular findings. Reproductive:  No mass or other significant abnormality. Other:  None. Musculoskeletal: No suspicious bone lesions identified. Multiple old lower thoracic and lumbar vertebral compression deformities are again seen. IMPRESSION: Small  amount of free intraperitoneal air, and subcutaneous emphysema in the chest and abdominal wall soft tissues, attributable to recent  laparoscopic cholecystectomy. No evidence of abscess or other complication. Stable mild biliary ductal dilatation. Moderate to large hiatal hernia, and esophagitis of the distal thoracic esophagus. Colonic diverticulosis, without radiographic evidence of diverticulitis. Electronically Signed   By: Danae Orleans M.D.   On: 05/13/2023 17:18    Assessment and Plan  Melanie Carrillo is a 88 year old female with year old female with history of GERD, depression, insomnia, with history of choledocholithiasis with chronic cholecystitis status post robotic assisted laparoscopic cholecystectomy on 05/12/2023.  She reports that she was sent to the ED by her general surgeon due to her worsening weakness and concerns for dehydration and abdominal pain.  She endorses generalized abdominal pain and weakness.  She denies fever, nausea, vomiting, chest pain, shortness of breath  Severe sepsis (HCC) Left lower lobe pneumonia versus atelectasis ---With leukocytosis, increased heart rate, WBC of 20.2, organ involvement of pulmonary with oxygen desaturation to 89% on room air -- MRI said PCR negative DC vancomycin -- Pro calcitonin 0.45 -- empiric Rocephin and azithromycin -- some of the leukocytosis could be due to inflammatory changes with recent laparoscopic cholecystectomy -- incentive spirometer -- white count trending down 20 K-- 18 K-- CBC and a.m. -- wean to room air if sats remains stable greater than 92%  Recent laparoscopic cholecystectomy -- followed by general surgery -- CT of the abdomen pelvis shows changes which are surgery related. No other acute changes  Hyperlipidemia, mixed Statins  GERD (gastroesophageal reflux disease) Home PPI nightly resumed   Essential hypertension PRN hydralazineInterstitial lung disease (HCC) Flutter valve, incentive spirometry   Adjustment insomnia Trazodone    Depression Sertraline     DVT prophylaxis: Heparin 5000 units subcutaneous every 8 hours Code Status: Full code Diet:  Chopped diet Family Communication: Updated grand niece in law at bedside with patient's permission Disposition Plan: Pending clinical course Consults called: General Surgery  Procedures: Family communication : Consults : CODE STATUS:  DVT Prophylaxis : Level of care: Med-Surg Status is: Inpatient Remains inpatient appropriate because: PNA    TOTAL TIME TAKING CARE OF THIS PATIENT: 35 minutes.  >50% time spent on counselling and coordination of care  Note: This dictation was prepared with Dragon dictation along with smaller phrase technology. Any transcriptional errors that result from this process are unintentional.  Enedina Finner M.D    [facility]   CC: Primary care physician; Ronnald Ramp, MD

## 2023-05-16 DIAGNOSIS — A419 Sepsis, unspecified organism: Secondary | ICD-10-CM | POA: Diagnosis not present

## 2023-05-16 DIAGNOSIS — R6520 Severe sepsis without septic shock: Secondary | ICD-10-CM | POA: Diagnosis not present

## 2023-05-16 LAB — CBC
HCT: 29.6 % — ABNORMAL LOW (ref 36.0–46.0)
Hemoglobin: 10 g/dL — ABNORMAL LOW (ref 12.0–15.0)
MCH: 34.7 pg — ABNORMAL HIGH (ref 26.0–34.0)
MCHC: 33.8 g/dL (ref 30.0–36.0)
MCV: 102.8 fL — ABNORMAL HIGH (ref 80.0–100.0)
Platelets: 263 10*3/uL (ref 150–400)
RBC: 2.88 MIL/uL — ABNORMAL LOW (ref 3.87–5.11)
RDW: 12 % (ref 11.5–15.5)
WBC: 7.9 10*3/uL (ref 4.0–10.5)
nRBC: 0 % (ref 0.0–0.2)

## 2023-05-16 LAB — GLUCOSE, CAPILLARY: Glucose-Capillary: 96 mg/dL (ref 70–99)

## 2023-05-16 MED ORDER — CEFUROXIME AXETIL 500 MG PO TABS: 500.0000 mg | ORAL_TABLET | Freq: Two times a day (BID) | ORAL | 0 refills | Status: AC

## 2023-05-16 MED ORDER — ENSURE ENLIVE PO LIQD: 237.0000 mL | Freq: Two times a day (BID) | ORAL | 12 refills | Status: AC

## 2023-05-16 MED ORDER — BISACODYL 10 MG RE SUPP
10.0000 mg | Freq: Once | RECTAL | Status: AC
  Administered 2023-05-16: 10 mg via RECTAL
  Filled 2023-05-16: qty 1

## 2023-05-16 MED ORDER — AZITHROMYCIN 250 MG PO TABS: ORAL_TABLET | ORAL | 0 refills | Status: DC

## 2023-05-16 NOTE — Care Management Important Message (Addendum)
Important Message  Patient Details  Name: Melanie Carrillo MRN: [id number] Date of Birth: 16-Feb-1935   Medicare Important Message Given:  Yes  Obtained initial consent for Medicare IM from patient.  Copy left with patient for reference, original to be scanned in the chart.   Johnell Comings 05/16/2023, 10:32 AM

## 2023-05-16 NOTE — Progress Notes (Addendum)
Physical Therapy Treatment Patient Details Name: Melanie Carrillo MRN: [id number] DOB: [birth year] Today's Date: 05/16/2023   History of Present Illness 88 y/o female presented to ED on 05/13/23 for tachycardia, tachypnea, and hypotension after laparoscopic cholecystectomy on 9/19. CXR concerning for PNA. Admitted for severe sepsis. PMH: hypertension, GERD, interstitial lung disease.    PT Comments  Pt in bed on room air.  She is able to get to EOB with time but no assist.  Stands with cues for hand placements and walks 50' in hall with RW and CGA x 1.  Gait is limited by need to use bathroom as she had a suppository 3 hours prior.  Large amount of gas but no BM noted.  Returned to supine.  Sats in bathroom and supine 88% on room air.  RN and MD aware.  She did not use O2 at home prior and boarder line with mobility today but improving since yesterday.   If plan is discharge home, recommend the following: A little help with walking and/or transfers;A little help with bathing/dressing/bathroom;Assistance with cooking/housework;Assist for transportation   Can travel by private vehicle        Equipment Recommendations  None recommended by PT    Recommendations for Other Services       Precautions / Restrictions Precautions Precautions: Fall Precaution Comments: JP drain Restrictions Weight Bearing Restrictions: No     Mobility  Bed Mobility Overal bed mobility: Needs Assistance Bed Mobility: Supine to Sit, Sit to Supine     Supine to sit: Contact guard Sit to supine: Contact guard assist   General bed mobility comments: cues for hand placements Patient Response: Cooperative  Transfers Overall transfer level: Needs assistance Equipment used: Rolling walker (2 wheels) Transfers: Sit to/from Stand Sit to Stand: Contact guard assist           General transfer comment: cues for hand placements    Ambulation/Gait Ambulation/Gait assistance: Contact guard assist Gait Distance  (Feet): 50 Feet Assistive device: Rolling walker (2 wheels) Gait Pattern/deviations: Step-through pattern, Decreased stride length Gait velocity: decreased     General Gait Details: dropped to 88% on room air today which is improved over yesterday   Stairs             Wheelchair Mobility     Tilt Bed Tilt Bed Patient Response: Cooperative  Modified Rankin (Stroke Patients Only)       Balance Overall balance assessment: Needs assistance Sitting-balance support: Feet supported Sitting balance-Leahy Scale: Good     Standing balance support: Reliant on assistive device for balance, During functional activity, Bilateral upper extremity supported Standing balance-Leahy Scale: Fair                              Cognition Arousal: Alert Behavior During Therapy: WFL for tasks assessed/performed Overall Cognitive Status: Within Functional Limits for tasks assessed                                          Exercises      General Comments        Pertinent Vitals/Pain Pain Assessment Pain Assessment: No/denies pain    Home Living                          Prior Function  PT Goals (current goals can now be found in the care plan section) Progress towards PT goals: Progressing toward goals    Frequency    Min 1X/week      PT Plan      Co-evaluation              AM-PAC PT "6 Clicks" Mobility   Outcome Measure  Help needed turning from your back to your side while in a flat bed without using bedrails?: A Little Help needed moving from lying on your back to sitting on the side of a flat bed without using bedrails?: A Little Help needed moving to and from a bed to a chair (including a wheelchair)?: A Little Help needed standing up from a chair using your arms (e.g., wheelchair or bedside chair)?: A Little Help needed to walk in hospital room?: A Little Help needed climbing 3-5 steps with a railing?  : A Little 6 Click Score: 18    End of Session Equipment Utilized During Treatment: Gait belt Activity Tolerance: Patient tolerated treatment well Patient left: in bed;with call bell/phone within reach;with bed alarm set Nurse Communication: Mobility status PT Visit Diagnosis: Unsteadiness on feet (R26.81);Muscle weakness (generalized) (M62.81)     Time: 1246-1300 PT Time Calculation (min) (ACUTE ONLY): 14 min  Charges:    $Gait Training: 8-22 mins PT General Charges $$ ACUTE PT VISIT: 1 Visit                   Danielle Dess, PTA 05/16/23, 1:12 PM

## 2023-05-16 NOTE — TOC Transition Note (Signed)
Transition of Care [facility]) - CM/SW Discharge Note   Patient Details  Name: Melanie Carrillo MRN: [id number] Date of Birth: 13-Aug-1935  [facility]) CM/SW Contact:  Darolyn Rua, LCSW Phone Number: 05/16/2023, 12:26 PM   Clinical Narrative:     CSW spoke with patient about [facility] recs, she reports being agreeable to any [facility] agency except [facility] who she had in the past.   CSW made referral to Adelina Mings with [facility], per MD she needs [facility] PT and no needs following drain as surgery will be following up outpatient.  No dme needs.    Final next level of care: Home w Home Health Services Barriers to Discharge: No Barriers Identified   Patient Goals and CMS Choice CMS Medicare.gov Compare Post Acute Care list provided to:: Patient Choice offered to / list presented to : Patient  Discharge Placement                         Discharge Plan and Services Additional resources added to the After Visit Summary for                                       Social Determinants of Health (SDOH) Interventions SDOH Screenings   Food Insecurity: No Food Insecurity (06/24/2022)  Housing: Low Risk  (06/24/2022)  Transportation Needs: No Transportation Needs (06/24/2022)  Utilities: Not At Risk (06/24/2022)  Alcohol Screen: Low Risk  (11/26/2022)  Depression (PHQ2-9): Low Risk  (03/22/2023)  Financial Resource Strain: Low Risk  (11/23/2021)   Received from [facility], [facility]  Tobacco Use: Low Risk  (05/13/2023)     Readmission Risk Interventions     No data to display

## 2023-05-16 NOTE — Progress Notes (Signed)
[facility] SURGICAL PROGRESS NOTE  Hospital Day(s): 3.   Post op day(s): 3  Interval History:  Patient seen and examined No acute events or new complaints overnight.  Patient reports she is feeling much better No abdominal pain No CP/SOB No fever, chills, nausea, emesis Leukocytosis resolved this AM; 7.9K Hgb to 10.0 Drain with 30 ccs; serous Azithromycin, Rocephin Regular diet  Vital signs in last 24 hours: [min-max] current  Temp:  [98.2 F (36.8 C)-98.5 F (36.9 C)] 98.4 F (36.9 C) (09/23 0434) Pulse Rate:  [91-109] 97 (09/23 0446) Resp:  [18-20] 20 (09/23 0434) BP: (109-186)/(65-90) 110/68 (09/23 0446) SpO2:  [90 %-100 %] 91 % (09/23 0434)     Height: 5\' 4"  (162.6 cm) Weight: 69.1 kg BMI (Calculated): 26.14   Intake/Output last 2 shifts:  09/22 0701 - 09/23 0700 In: 600 [P.O.:600] Out: 280 [Urine:250; Drains:30]   Physical Exam:  Constitutional: alert, cooperative and no distress  Respiratory: breathing non-labored at rest  Cardiovascular: regular rate and sinus rhythm  Gastrointestinal: soft, non-tender, and non-distended. No rebound/guarding. Surgical drain in right lateral port site; output serous Integumentary: Laparoscopic incisions are CDI with dermabond, no erythema or drianage   Labs:     Latest Ref Rng & Units 05/16/2023    4:25 AM 05/14/2023    2:47 AM 05/13/2023    2:30 PM  CBC  WBC 4.0 - 10.5 K/uL 7.9  18.3  20.2   Hemoglobin 12.0 - 15.0 g/dL 21.3  08.6  57.8   Hematocrit 36.0 - 46.0 % 29.6  32.6  36.8   Platelets 150 - 400 K/uL 263  214  240       Latest Ref Rng & Units 05/14/2023    2:47 AM 05/13/2023    1:34 PM 02/28/2023    4:24 PM  CMP  Glucose 70 - 99 mg/dL 98  469  629   BUN 8 - 23 mg/dL 7  11  14    Creatinine 0.44 - 1.00 mg/dL 5.28  4.13  2.44   Sodium 135 - 145 mmol/L 131  130  137   Potassium 3.5 - 5.1 mmol/L 3.9  4.0  4.1   Chloride 98 - 111 mmol/L 97  97  104   CO2 22 - 32 mmol/L 25  24  21    Calcium 8.9 - 10.3  mg/dL 9.5  9.9  01.0   Total Protein 6.5 - 8.1 g/dL  7.1  7.3   Total Bilirubin 0.3 - 1.2 mg/dL  1.4  0.3   Alkaline Phos 38 - 126 U/L  88  79   AST 15 - 41 U/L  58  18   ALT 0 - 44 U/L  34  8      Imaging studies: No new pertinent imaging studies   Assessment/Plan:  88 y.o. female with clinically improved CAP, 4 days s/p laparoscopic cholecystectomy for    - Okay for diet as tolerated - Continue Abx regarding CAP - Continue surgical drain; monitor and record output. She will go home with this. Will leave instructions - Monitor abdominal examination; on-going bowel function   - Pain control prn; antiemetics prn   - Mobilize as tolerated  - Further management per primary service  - Discharge Planning: Okay for discharge from surgical perspective once cleared from medical standpoint. Will follow up on 09/26 for drain removal. Instructions updated.   All of the above findings and recommendations were discussed with the patient, and the medical team, and  all of patient's questions were answered to her expressed satisfaction.  -- Lynden Oxford, PA-C [facility] 05/16/2023, 7:37 AM M-F: 7am - 4pm

## 2023-05-16 NOTE — Progress Notes (Signed)
Discharge instructions reviewed with patient including followup visits and new medications.  Understanding was verbalized and all questions were answered.  IV removed without complication; patient tolerated well.  Patient discharged home via wheelchair in stable condition escorted by nursing staff.  

## 2023-05-16 NOTE — Discharge Instructions (Signed)
In addition to included general post-operative instructions,  Diet: Resume home diet. Recommend avoiding or limiting fatty/greasy foods over the next few days/week. If you do eat these, you may (or may not) notice diarrhea. This is expected while your body adjusts to not having a gallbladder, and it typically resolves with time.    Activity: No heavy lifting >20 pounds (children, pets, laundry, garbage) or strenuous activity for 4 weeks, but light activity and walking are encouraged. Do not drive or drink alcohol if taking narcotic pain medications or having pain that might distract from driving.  Wound care: If you can keep drain site covered and water proof, you may shower/get incision wet with soapy water and pat dry (do not rub incisions), but no baths or submerging incision underwater until follow-up.   Medications: Resume all home medications. For mild to moderate pain: acetaminophen (Tylenol) or ibuprofen/naproxen (if no kidney disease). Combining Tylenol with alcohol can substantially increase your risk of causing liver disease. Narcotic pain medications, if prescribed, can be used for severe pain, though may cause nausea, constipation, and drowsiness. Do not combine Tylenol and Percocet (or similar) within a 6 hour period as Percocet (and similar) contain(s) Tylenol. If you do not need the narcotic pain medication, you do not need to fill the prescription.  Call office [phone] / [phone]) at any time if any questions, worsening pain, fevers/chills, bleeding, drainage from incision site, or other concerns.

## 2023-05-16 NOTE — Discharge Summary (Signed)
Physician Discharge Summary   Patient: Melanie Carrillo MRN: [id number] DOB: 05-01-1935  Admit date:     05/13/2023  Discharge date: 05/16/23  Discharge Physician: Enedina Finner   PCP: Ronnald Ramp, MD   Recommendations at discharge:   follow-up surgery on your scheduled appointment follow-up PCP in 1 to 2 weeks  Discharge Diagnoses: Principal Problem:   Severe sepsis [facility]) Active Problems:   Interstitial lung disease (HCC)   Essential hypertension   GERD (gastroesophageal reflux disease)   Hyperlipidemia, mixed   Depression   Adjustment insomnia  Melanie Carrillo is a 88 year old female with history of GERD, depression, insomnia, with history of choledocholithiasis with chronic cholecystitis status post robotic assisted laparoscopic cholecystectomy on 05/12/2023.  She reports that she was sent to the ED by her general surgeon due to her worsening weakness and concerns for dehydration and abdominal pain.  She endorses generalized abdominal pain and weakness.  She denies fever, nausea, vomiting, chest pain, shortness of breath   Severe sepsis (HCC) Left lower lobe pneumonia versus atelectasis ---With leukocytosis, increased heart rate, WBC of 20.2, organ involvement of pulmonary with oxygen desaturation to 89% on room air -- MRI said PCR negative DC vancomycin -- Pro calcitonin 0.45 -- empiric Rocephin and azithromycin -- some of the leukocytosis could be due to inflammatory changes with recent laparoscopic cholecystectomy -- incentive spirometer -- white count trending down 20 K-- 18 K-- 7.9 -- weaned to room air--sas >91% --pt overall feels better   Recent laparoscopic cholecystectomy -- followed by general surgery -- CT of the abdomen pelvis shows changes which are surgery related. No other acute changes --ok to d/c home   Hyperlipidemia, mixed Statins   GERD (gastroesophageal reflux disease) Home PPI nightly resumed   Essential hypertension PRN  hydralazineInterstitial lung disease (HCC) Flutter valve, incentive spirometry   Adjustment insomnia Trazodone    Depression Sertraline     DVT prophylaxis: Heparin 5000 units subcutaneous every 8 hours Code Status: Full code      Pain control - [place] Controlled Substance Reporting System database was reviewed. and patient was instructed, not to drive, operate heavy machinery, perform activities at heights, swimming or participation in water activities or provide baby-sitting services while on Pain, Sleep and Anxiety Medications; until their outpatient Physician has advised to do so again. Also recommended to not to take more than prescribed Pain, Sleep and Anxiety Medications.  Consultants: Gen surgery Disposition: Home health Diet recommendation:  Discharge Diet Orders (From admission, onward)     Start     Ordered   05/16/23 0000  Diet - low sodium heart healthy        05/16/23 1159           Cardiac diet DISCHARGE MEDICATION: Allergies as of 05/16/2023   No Known Allergies      Medication List     STOP taking these medications    amoxicillin-clavulanate 875-125 MG tablet Commonly known as: AUGMENTIN       TAKE these medications    azithromycin 250 MG tablet Commonly known as: ZITHROMAX Take 1 tab daily   brimonidine 0.2 % ophthalmic solution Commonly known as: ALPHAGAN Place 1 drop into both eyes in the morning and at bedtime.   cefUROXime 500 MG tablet Commonly known as: CEFTIN Take 1 tablet (500 mg total) by mouth 2 (two) times daily with a meal for 4 days.   feeding supplement Liqd Take 237 mLs by mouth 2 (two) times daily between meals.   HYDROcodone-acetaminophen  5-325 MG tablet Commonly known as: NORCO/VICODIN Take 1-2 tablets by mouth every 6 (six) hours as needed for moderate pain.   latanoprost 0.005 % ophthalmic solution Commonly known as: XALATAN Place 1 drop into both eyes at bedtime.   losartan 100 MG tablet Commonly  known as: COZAAR Take 100 mg by mouth every morning.   lovastatin 40 MG tablet Commonly known as: MEVACOR Take 40 mg by mouth daily with supper.   montelukast 10 MG tablet Commonly known as: SINGULAIR Take 1 tablet (10 mg total) by mouth at bedtime.   Mucinex 600 MG 12 hr tablet Generic drug: guaiFENesin Take 600 mg by mouth at bedtime.   multivitamin tablet Take 1 tablet by mouth daily.   omeprazole 40 MG capsule Commonly known as: PRILOSEC Take 40 mg by mouth at bedtime.   sertraline 50 MG tablet Commonly known as: ZOLOFT TAKE 1 TABLET BY MOUTH EVERY DAY   sertraline 25 MG tablet Commonly known as: ZOLOFT Take 25 mg by mouth daily.   simethicone 80 MG chewable tablet Commonly known as: MYLICON Chew 80 mg by mouth as needed.   traZODone 50 MG tablet Commonly known as: DESYREL Take 0.5-1 tablets (25-50 mg total) by mouth at bedtime as needed.   Vitamin D-3 125 MCG (5000 UT) Tabs Take 5,000 Units by mouth in the morning.        Follow-up Information     Donovan Kail, PA-C. Go on 05/19/2023.   Specialty: Physician Assistant Why: Go to appointment on 09/26 at 245 PM for drain removal Contact information: [address] [place] [address] [phone]         Ronnald Ramp, MD. Schedule an appointment as soon as possible for a visit in 1 week(s).   Specialty: Family Medicine Why: hospital f/u Contact information: [address] [place] [address] [phone]                Discharge Exam: Ceasar Mons Weights   05/13/23 1905  Weight: 69.1 kg  GENERAL:  88 y.o.-year-old patient with no acute distress.  LUNGS: decreased breath sounds bilaterally, no wheezing CARDIOVASCULAR: S1, S2 normal. No murmur   ABDOMEN: Soft, nontender, nondistended. Surgical incisions present. Drain right upper quadrant present EXTREMITIES: No  edema b/l.    NEUROLOGIC: nonfocal  patient is alert and awake Condition at  discharge: fair  The results of significant diagnostics from this hospitalization (including imaging, microbiology, ancillary and laboratory) are listed below for reference.   Imaging Studies: CT ABDOMEN PELVIS W CONTRAST  Result Date: 05/13/2023 CLINICAL DATA:  Postop abdominal pain. One day postop from laparoscopic cholecystectomy. EXAM: CT ABDOMEN AND PELVIS WITH CONTRAST TECHNIQUE: Multidetector CT imaging of the abdomen and pelvis was performed using the standard protocol following bolus administration of intravenous contrast. RADIATION DOSE REDUCTION: This exam was performed according to the departmental dose-optimization program which includes automated exposure control, adjustment of the mA and/or kV according to patient size and/or use of iterative reconstruction technique. CONTRAST:  80mL OMNIPAQUE IOHEXOL 300 MG/ML  SOLN COMPARISON:  03/16/2023 FINDINGS: Lower Chest: Mild atelectasis in both lower lobes. No evidence of basilar pneumothorax. Subcutaneous emphysema is seen in the chest wall soft tissues bilaterally in the inferior mediastinum, attributable to recent laparoscopic cholecystectomy. Hepatobiliary: No suspicious hepatic masses identified. Postop changes are seen from cholecystectomy. Mild biliary ductal dilatation is stable, and mild pneumobilia is also noted. Surgical drain is seen in the anterior abdomen, and no abnormal fluid collections are seen. Pancreas:  No mass or inflammatory changes. Spleen: Within normal limits in size and appearance. Adrenals/Urinary Tract: No suspicious masses identified. No evidence of ureteral calculi or hydronephrosis. Stomach/Bowel: Small amount of free intraperitoneal air is seen, which is attributable to recent cholecystectomy. Subcutaneous emphysema is also seen within the abdominal wall soft tissues bilaterally no No evidence of abscess or free fluid. No evidence of bowel obstruction or focal inflammatory process. Moderate to large hiatal hernia is  again seen. Wall thickening is again seen involving the distal thoracic esophagus, consistent with esophagitis. Diverticulosis is seen mainly involving the descending and sigmoid colon, however there is no evidence of diverticulitis. Vascular/Lymphatic: No pathologically enlarged lymph nodes. No acute vascular findings. Reproductive:  No mass or other significant abnormality. Other:  None. Musculoskeletal: No suspicious bone lesions identified. Multiple old lower thoracic and lumbar vertebral compression deformities are again seen. IMPRESSION: Small amount of free intraperitoneal air, and subcutaneous emphysema in the chest and abdominal wall soft tissues, attributable to recent laparoscopic cholecystectomy. No evidence of abscess or other complication. Stable mild biliary ductal dilatation. Moderate to large hiatal hernia, and esophagitis of the distal thoracic esophagus. Colonic diverticulosis, without radiographic evidence of diverticulitis. Electronically Signed   By: Danae Orleans M.D.   On: 05/13/2023 17:18   DG Chest Port 1 View  Result Date: 05/13/2023 CLINICAL DATA:  Questionable sepsis, evaluate for abnormality. EXAM: PORTABLE CHEST 1 VIEW COMPARISON:  None Available. FINDINGS: The heart size is normal. Atherosclerotic calcifications are present at the aortic arch. Asymmetric left lower lobe airspace disease is present. The lung volumes are low. The right lung is clear. Asymmetric degenerative changes are present in the left shoulder. IMPRESSION: 1. Asymmetric left lower lobe airspace disease concerning for pneumonia. 2. Low lung volumes. Electronically Signed   By: Marin Roberts M.D.   On: 05/13/2023 14:54   DG C-Arm 1-60 Min-No Report  Result Date: 04/28/2023 Fluoroscopy was utilized by the requesting physician.  No radiographic interpretation.   DG C-Arm 1-60 Min-No Report  Result Date: 04/28/2023 Fluoroscopy was utilized by the requesting physician.  No radiographic interpretation.     Microbiology: Results for orders placed or performed during the hospital encounter of 05/13/23  Blood Culture (routine x 2)     Status: None (Preliminary result)   Collection Time: 05/13/23  1:34 PM   Specimen: BLOOD  Result Value Ref Range Status   Specimen Description BLOOD BLOOD RIGHT HAND  Final   Special Requests   Final    BOTTLES DRAWN AEROBIC AND ANAEROBIC Blood Culture adequate volume   Culture   Final    NO GROWTH 3 DAYS Performed at [facility], [address], [place], [place] [address]    Report Status PENDING  Incomplete  Blood Culture (routine x 2)     Status: None (Preliminary result)   Collection Time: 05/13/23  1:34 PM   Specimen: BLOOD  Result Value Ref Range Status   Specimen Description BLOOD BLOOD LEFT WRIST  Final   Special Requests   Final    BOTTLES DRAWN AEROBIC AND ANAEROBIC Blood Culture adequate volume   Culture   Final    NO GROWTH 3 DAYS Performed at [facility], [address] [place], [place] [address]    Report Status PENDING  Incomplete  Resp panel by RT-PCR (RSV, Flu A&B, Covid) Anterior Nasal Swab     Status: None   Collection Time: 05/13/23  1:58 PM   Specimen: Anterior Nasal Swab  Result Value Ref Range  Status   SARS Coronavirus 2 by RT PCR NEGATIVE NEGATIVE Final    Comment: (NOTE) SARS-CoV-2 target nucleic acids are NOT DETECTED.  The SARS-CoV-2 RNA is generally detectable in upper respiratory specimens during the acute phase of infection. The lowest concentration of SARS-CoV-2 viral copies this assay can detect is 138 copies/mL. A negative result does not preclude SARS-Cov-2 infection and should not be used as the sole basis for treatment or other patient management decisions. A negative result may occur with  improper specimen collection/handling, submission of specimen other than nasopharyngeal swab, presence of viral mutation(s) within the areas targeted by this assay, and inadequate number of  viral copies(<138 copies/mL). A negative result must be combined with clinical observations, patient history, and epidemiological information. The expected result is Negative.  Fact Sheet for Patients:  BloggerCourse.com  Fact Sheet for Healthcare Providers:  SeriousBroker.it  This test is no t yet approved or cleared by the [place] FDA and  has been authorized for detection and/or diagnosis of SARS-CoV-2 by FDA under an Emergency Use Authorization (EUA). This EUA will remain  in effect (meaning this test can be used) for the duration of the COVID-19 declaration under Section 564(b)(1) of the Act, 21 U.S.C.section 360bbb-3(b)(1), unless the authorization is terminated  or revoked sooner.       Influenza A by PCR NEGATIVE NEGATIVE Final   Influenza B by PCR NEGATIVE NEGATIVE Final    Comment: (NOTE) The Xpert Xpress SARS-CoV-2/FLU/RSV plus assay is intended as an aid in the diagnosis of influenza from Nasopharyngeal swab specimens and should not be used as a sole basis for treatment. Nasal washings and aspirates are unacceptable for Xpert Xpress SARS-CoV-2/FLU/RSV testing.  Fact Sheet for Patients: BloggerCourse.com  Fact Sheet for Healthcare Providers: SeriousBroker.it  This test is not yet approved or cleared by the [place] FDA and has been authorized for detection and/or diagnosis of SARS-CoV-2 by FDA under an Emergency Use Authorization (EUA). This EUA will remain in effect (meaning this test can be used) for the duration of the COVID-19 declaration under Section 564(b)(1) of the Act, 21 U.S.C. section 360bbb-3(b)(1), unless the authorization is terminated or revoked.     Resp Syncytial Virus by PCR NEGATIVE NEGATIVE Final    Comment: (NOTE) Fact Sheet for Patients: BloggerCourse.com  Fact Sheet for Healthcare  Providers: SeriousBroker.it  This test is not yet approved or cleared by the [place] FDA and has been authorized for detection and/or diagnosis of SARS-CoV-2 by FDA under an Emergency Use Authorization (EUA). This EUA will remain in effect (meaning this test can be used) for the duration of the COVID-19 declaration under Section 564(b)(1) of the Act, 21 U.S.C. section 360bbb-3(b)(1), unless the authorization is terminated or revoked.  Performed at [facility], [address] [place], [place] [address]   MRSA Next Gen by PCR, Nasal     Status: None   Collection Time: 05/13/23  7:26 PM   Specimen: Nasal Mucosa; Nasal Swab  Result Value Ref Range Status   MRSA by PCR Next Gen NOT DETECTED NOT DETECTED Final    Comment: (NOTE) The GeneXpert MRSA Assay (FDA approved for NASAL specimens only), is one component of a comprehensive MRSA colonization surveillance program. It is not intended to diagnose MRSA infection nor to guide or monitor treatment for MRSA infections. Test performance is not FDA approved in patients less than 30 years old. Performed at [facility], [address] [place], [place] [address]  Labs: CBC: Recent Labs  Lab 05/13/23 1430 05/14/23 0247 05/16/23 0425  WBC 20.2* 18.3* 7.9  NEUTROABS 16.5*  --   --   HGB 11.8* 10.7* 10.0*  HCT 36.8 32.6* 29.6*  MCV 106.1* 103.2* 102.8*  PLT 240 214 263   Basic Metabolic Panel: Recent Labs  Lab 05/13/23 1334 05/14/23 0247  NA 130* 131*  K 4.0 3.9  CL 97* 97*  CO2 24 25  GLUCOSE 143* 98  BUN 11 7*  CREATININE 0.63 0.51  CALCIUM 9.9 9.5   Liver Function Tests: Recent Labs  Lab 05/13/23 1334  AST 58*  ALT 34  ALKPHOS 88  BILITOT 1.4*  PROT 7.1  ALBUMIN 3.0*   CBG: Recent Labs  Lab 05/13/23 1908  GLUCAP 96    Discharge time spent: greater than 30 minutes.  Signed: Enedina Finner, MD [facility] 05/16/2023

## 2023-05-18 LAB — CULTURE, BLOOD (ROUTINE X 2)
Culture: NO GROWTH
Culture: NO GROWTH
Special Requests: ADEQUATE
Special Requests: ADEQUATE

## 2023-05-19 ENCOUNTER — Encounter: Payer: Medicare HMO | Admitting: Physician Assistant

## 2023-05-20 ENCOUNTER — Inpatient Hospital Stay: Payer: Medicare HMO | Admitting: Family Medicine

## 2023-05-20 DIAGNOSIS — K209 Esophagitis, unspecified without bleeding: Secondary | ICD-10-CM | POA: Diagnosis not present

## 2023-05-20 DIAGNOSIS — Z4803 Encounter for change or removal of drains: Secondary | ICD-10-CM | POA: Diagnosis not present

## 2023-05-20 DIAGNOSIS — K5730 Diverticulosis of large intestine without perforation or abscess without bleeding: Secondary | ICD-10-CM | POA: Diagnosis not present

## 2023-05-20 DIAGNOSIS — E782 Mixed hyperlipidemia: Secondary | ICD-10-CM | POA: Diagnosis not present

## 2023-05-20 DIAGNOSIS — I10 Essential (primary) hypertension: Secondary | ICD-10-CM | POA: Diagnosis not present

## 2023-05-20 DIAGNOSIS — M1712 Unilateral primary osteoarthritis, left knee: Secondary | ICD-10-CM | POA: Diagnosis not present

## 2023-05-20 DIAGNOSIS — Z9181 History of falling: Secondary | ICD-10-CM | POA: Diagnosis not present

## 2023-05-20 DIAGNOSIS — J849 Interstitial pulmonary disease, unspecified: Secondary | ICD-10-CM | POA: Diagnosis not present

## 2023-05-20 DIAGNOSIS — R6520 Severe sepsis without septic shock: Secondary | ICD-10-CM | POA: Diagnosis not present

## 2023-05-20 DIAGNOSIS — Z48815 Encounter for surgical aftercare following surgery on the digestive system: Secondary | ICD-10-CM | POA: Diagnosis not present

## 2023-05-20 DIAGNOSIS — Z974 Presence of external hearing-aid: Secondary | ICD-10-CM | POA: Diagnosis not present

## 2023-05-20 DIAGNOSIS — Z792 Long term (current) use of antibiotics: Secondary | ICD-10-CM | POA: Diagnosis not present

## 2023-05-20 DIAGNOSIS — G4733 Obstructive sleep apnea (adult) (pediatric): Secondary | ICD-10-CM | POA: Diagnosis not present

## 2023-05-20 DIAGNOSIS — M519 Unspecified thoracic, thoracolumbar and lumbosacral intervertebral disc disorder: Secondary | ICD-10-CM | POA: Diagnosis not present

## 2023-05-20 DIAGNOSIS — H409 Unspecified glaucoma: Secondary | ICD-10-CM | POA: Diagnosis not present

## 2023-05-20 DIAGNOSIS — F32A Depression, unspecified: Secondary | ICD-10-CM | POA: Diagnosis not present

## 2023-05-20 DIAGNOSIS — Z5982 Transportation insecurity: Secondary | ICD-10-CM | POA: Diagnosis not present

## 2023-05-20 DIAGNOSIS — Z9049 Acquired absence of other specified parts of digestive tract: Secondary | ICD-10-CM | POA: Diagnosis not present

## 2023-05-20 DIAGNOSIS — J189 Pneumonia, unspecified organism: Secondary | ICD-10-CM | POA: Diagnosis not present

## 2023-05-20 DIAGNOSIS — A419 Sepsis, unspecified organism: Secondary | ICD-10-CM | POA: Diagnosis not present

## 2023-05-20 DIAGNOSIS — T797XXD Traumatic subcutaneous emphysema, subsequent encounter: Secondary | ICD-10-CM | POA: Diagnosis not present

## 2023-05-20 DIAGNOSIS — K219 Gastro-esophageal reflux disease without esophagitis: Secondary | ICD-10-CM | POA: Diagnosis not present

## 2023-05-20 DIAGNOSIS — K449 Diaphragmatic hernia without obstruction or gangrene: Secondary | ICD-10-CM | POA: Diagnosis not present

## 2023-05-20 DIAGNOSIS — F5102 Adjustment insomnia: Secondary | ICD-10-CM | POA: Diagnosis not present

## 2023-05-23 ENCOUNTER — Other Ambulatory Visit: Payer: Self-pay

## 2023-05-23 ENCOUNTER — Telehealth: Payer: Self-pay | Admitting: *Deleted

## 2023-05-23 DIAGNOSIS — A419 Sepsis, unspecified organism: Secondary | ICD-10-CM

## 2023-05-23 NOTE — Progress Notes (Signed)
Care Coordination   Note   05/23/2023 Name: DAJONNA ETO MRN: [id number] DOB: 27-Aug-1934  TACORI WILLHOITE is a 88 y.o. year old female who sees [name], Tawanna Cooler, MD for primary care. I reached out to Warren Lacy by phone today to offer care coordination services.  Ms. Dacruz was given information about Care Coordination services today including:   The Care Coordination services include support from the care team which includes your Nurse Coordinator, Clinical Social Worker, or Pharmacist.  The Care Coordination team is here to help remove barriers to the health concerns and goals most important to you. Care Coordination services are voluntary, and the patient may decline or stop services at any time by request to their care team member.   Care Coordination Consent Status: Patient agreed to services and verbal consent obtained.   Follow up plan:  Telephone appointment with care coordination team member scheduled for:  06/10/2023  Encounter Outcome:  Patient Scheduled from referral   Burman Nieves, [facility] Care Coordination Care Guide Direct Dial: [phone]

## 2023-05-23 NOTE — Consult Note (Signed)
[facility] Customer service manager [facility]) Accountable Care Organization (ACO) [facility] Liaison Note  05/23/2023  Melanie Carrillo 1934/12/02 [id number]  Location: [facility] RN Hospital Liaison screened the patient remotely at [facility]  Insurance: [employer] Advantage   [name] is a 88 y.o. female who is a Primary Care Patient of [name], Financial controller, MD ([facility]). The patient was screened for  readmission hospitalization with noted low risk score for unplanned readmission risk with 1  IP in 6 months.  The patient was assessed for potential [facility]) Care Management service needs for post hospital transition for care coordination. Review of patient's electronic medical record reveals patient was admitted for Sepsis. Will mae a referral for post hospital prevention readmission for a nurse care coordinator follow up call.   Plan: Referral request for community care coordination: Will make a referral or care coordination services due to admitted diagnosis.   [facility] Care Management/Population Health does not replace or interfere with any arrangements made by the Inpatient Transition of Care team.   For questions contact:   Elliot Cousin, RN, [facility] Liaison [place]   Population Health Office Hours MTWF  8:00 am-6:00 pm [phone] mobile [phone] [Office toll free line] Office Hours are M-F 8:30 - 5 pm [email or web address]

## 2023-05-24 ENCOUNTER — Ambulatory Visit (INDEPENDENT_AMBULATORY_CARE_PROVIDER_SITE_OTHER): Payer: Medicare HMO | Admitting: Family Medicine

## 2023-05-24 VITALS — BP 116/84 | HR 91 | Temp 97.7°F | Ht 64.0 in | Wt 148.0 lb

## 2023-05-24 DIAGNOSIS — Z9049 Acquired absence of other specified parts of digestive tract: Secondary | ICD-10-CM

## 2023-05-24 DIAGNOSIS — Z8619 Personal history of other infectious and parasitic diseases: Secondary | ICD-10-CM

## 2023-05-24 MED ORDER — SERTRALINE HCL 50 MG PO TABS: 50.0000 mg | ORAL_TABLET | Freq: Every day | ORAL | 1 refills | Status: DC

## 2023-05-24 NOTE — Progress Notes (Signed)
Established patient visit   Patient: Melanie Carrillo   DOB: Nov 18, 1934   88 y.o. Female  MRN: [id number] Visit Date: 05/24/2023  Today's healthcare provider: Jacky Kindle, FNP  Introduced to nurse practitioner role and practice setting.  All questions answered.  Discussed provider/patient relationship and expectations.  Subjective    HPI  Follow up Hospitalization  Patient was admitted to [facility] on 9/20 and discharged on 9/23. She was treated for sepsis including low grade fever, elevated HR, elevated RR and low BP following general surgery procedures performed earlier that week for gallbladder concerns including lap cholecystotomy with retained JP for ongoing fluid monitoring. Treatment for this included IVFs and ABX in addition to pain control. Telephone follow up was done on 05/23/23 She reports fair compliance with treatment. She reports this condition is improved. She is eager for JP removal that has been rescheduled for 10/2.  The patient, with a history of gallbladder disease, recently underwent gallbladder surgery. Postoperatively, the patient experienced symptoms of infection, including elevated heart rate, low-grade fever, and difficulty breathing, which necessitated a three-day hospital stay. The patient was treated with fluids and antibiotics and is currently recovering, although she still feels weak. The patient's sodium levels have been historically low, and she has been experiencing anemia. Since the gallbladder surgery, the patient's diet and bowel movements have changed, with bowel movements becoming more frequent and sudden. ----------------------------------------------------------------------------------------- -  Medications: Outpatient Medications Prior to Visit  Medication Sig   azithromycin (ZITHROMAX) 250 MG tablet Take 1 tab daily   brimonidine (ALPHAGAN) 0.2 % ophthalmic solution Place 1 drop into both eyes in the morning and at bedtime.   Cholecalciferol  (VITAMIN D-3) 125 MCG (5000 UT) TABS Take 5,000 Units by mouth in the morning.   feeding supplement (ENSURE ENLIVE / ENSURE PLUS) LIQD Take 237 mLs by mouth 2 (two) times daily between meals.   guaiFENesin (MUCINEX) 600 MG 12 hr tablet Take 600 mg by mouth at bedtime.   HYDROcodone-acetaminophen (NORCO/VICODIN) 5-325 MG tablet Take 1-2 tablets by mouth every 6 (six) hours as needed for moderate pain.   latanoprost (XALATAN) 0.005 % ophthalmic solution Place 1 drop into both eyes at bedtime.   losartan (COZAAR) 100 MG tablet Take 100 mg by mouth every morning.   lovastatin (MEVACOR) 40 MG tablet Take 40 mg by mouth daily with supper.   montelukast (SINGULAIR) 10 MG tablet Take 1 tablet (10 mg total) by mouth at bedtime.   Multiple Vitamin (MULTIVITAMIN) tablet Take 1 tablet by mouth daily.   omeprazole (PRILOSEC) 40 MG capsule Take 40 mg by mouth at bedtime.   sertraline (ZOLOFT) 25 MG tablet Take 25 mg by mouth daily.   sertraline (ZOLOFT) 50 MG tablet TAKE 1 TABLET BY MOUTH EVERY DAY   simethicone (MYLICON) 80 MG chewable tablet Chew 80 mg by mouth as needed.   traZODone (DESYREL) 50 MG tablet Take 0.5-1 tablets (25-50 mg total) by mouth at bedtime as needed.   No facility-administered medications prior to visit.     Objective    LMP  (LMP Unknown)   Physical Exam Vitals and nursing note reviewed.  Constitutional:      General: She is not in acute distress.    Appearance: Normal appearance. She is normal weight. She is not ill-appearing, toxic-appearing or diaphoretic.  HENT:     Head: Normocephalic and atraumatic.  Cardiovascular:     Rate and Rhythm: Normal rate and regular rhythm.     Pulses: Normal  pulses.     Heart sounds: Normal heart sounds. No murmur heard.    No friction rub. No gallop.  Pulmonary:     Effort: Pulmonary effort is normal. No respiratory distress.     Breath sounds: Normal breath sounds. No stridor. No wheezing, rhonchi or rales.  Chest:     Chest wall:  No tenderness.  Abdominal:     General: Bowel sounds are normal.     Palpations: Abdomen is soft.     Comments: Benign surgical lap sites; RLQ JP site in place  Musculoskeletal:        General: No swelling, tenderness, deformity or signs of injury. Normal range of motion.     Right lower leg: No edema.     Left lower leg: No edema.  Skin:    General: Skin is warm and dry.     Capillary Refill: Capillary refill takes less than 2 seconds.     Coloration: Skin is not jaundiced or pale.     Findings: No bruising, erythema, lesion or rash.  Neurological:     General: No focal deficit present.     Mental Status: She is alert and oriented to person, place, and time. Mental status is at baseline.     Cranial Nerves: No cranial nerve deficit.     Sensory: No sensory deficit.     Motor: No weakness.     Coordination: Coordination normal.  Psychiatric:        Mood and Affect: Mood normal.        Behavior: Behavior normal.        Thought Content: Thought content normal.        Judgment: Judgment normal.    SKIN: Sites on abdomen from laparoscopic procedures well-healed, no signs of infection or complications. Mild pruritus at surgical sites.  No results found for any visits on 05/24/23.  Assessment & Plan     Problem List Items Addressed This Visit   None Post-cholecystectomy status Recovering from recent gallbladder surgery with a drain in place. Drain output is approximately 10 mLs in 24 hours. Drain removal scheduled for tomorrow. Surgical sites are healing well with no signs of infection. -Continue current care. -Drain removal as scheduled. -Pt declines repeat CBC and CMP as last WBC was stable and will follow up on anemia if symptomatic.  No follow-ups on file.     Leilani Merl, FNP, have reviewed all documentation for this visit. The documentation on 05/24/23 for the exam, diagnosis, procedures, and orders are all accurate and complete.  Jacky Kindle, FNP  [facility] [phone] (phone) [phone] (fax)  [facility]

## 2023-05-25 ENCOUNTER — Ambulatory Visit (INDEPENDENT_AMBULATORY_CARE_PROVIDER_SITE_OTHER): Payer: Medicare HMO | Admitting: Physician Assistant

## 2023-05-25 ENCOUNTER — Encounter: Payer: Self-pay | Admitting: Physician Assistant

## 2023-05-25 VITALS — BP 168/90 | HR 101 | Temp 98.0°F | Ht 64.0 in | Wt 144.0 lb

## 2023-05-25 DIAGNOSIS — K8044 Calculus of bile duct with chronic cholecystitis without obstruction: Secondary | ICD-10-CM

## 2023-05-25 DIAGNOSIS — Z09 Encounter for follow-up examination after completed treatment for conditions other than malignant neoplasm: Secondary | ICD-10-CM

## 2023-05-25 DIAGNOSIS — K812 Acute cholecystitis with chronic cholecystitis: Secondary | ICD-10-CM

## 2023-05-25 NOTE — Patient Instructions (Signed)

## 2023-05-25 NOTE — Progress Notes (Signed)
[facility] POST-OP OFFICE VISIT  05/25/2023  HPI: Melanie Carrillo is a 88 y.o. female 13 days s/p robotic assisted laparoscopic cholecystectomy for severe chronic cholecystitis complicated by admission for CAP post-operatively   She is doing well Biggest complaint is soreness at drain site Some nausea intermittently but this is reportedly baseline Is also having occasional diarrhea; not significant amount otherwise bowel movements are normal Does feel more fatigued/weaker since admission No fever, chills, emesis Incisions are healing well Drain with 10-20 ccs daily; serous No other complaints   Vital signs: BP (!) 168/90   Pulse (!) 101   Temp 98 F (36.7 C)   Ht 5\' 4"  (1.626 m)   Wt 144 lb (65.3 kg)   LMP  (LMP Unknown)   SpO2 98%   BMI 24.72 kg/m    Physical Exam: Constitutional: Well appearing female, NAD Abdomen: Soft, non-tender, non-distended, no rebound/guarding. Surgical drain in right lateral port site; output serous (REMOVED).  Skin: Laparoscopic incisions are healing well, no erythema or drainage   Assessment/Plan: This is a 88 y.o. female 13 days s/p robotic assisted laparoscopic cholecystectomy for severe chronic cholecystitis   - Drain removed without issue; dressing placed; reviewed care  - Monitor diarrhea, does not seem bothersome or significant currently. Okay to utilize antidiarrheals/fiber as needed  - Pain control prn  - Reviewed wound care recommendation  - Reviewed lifting restrictions; 4 weeks total  - Reviewed surgical pathology; CCC  - She can follow up on as needed basis; She understands to call with questions/concerns. Daughter at bedside as well; in agreement and all questions answered.   -- Lynden Oxford, PA-C [facility] 05/25/2023, 2:13 PM M-F: 7am - 4pm

## 2023-05-31 NOTE — Addendum Note (Signed)
Addended by: Merita Norton T on: 05/31/2023 08:21 AM   Modules accepted: Level of Service

## 2023-06-01 ENCOUNTER — Encounter: Payer: Medicare HMO | Admitting: Physician Assistant

## 2023-06-01 DIAGNOSIS — A419 Sepsis, unspecified organism: Secondary | ICD-10-CM | POA: Diagnosis not present

## 2023-06-01 DIAGNOSIS — Z9049 Acquired absence of other specified parts of digestive tract: Secondary | ICD-10-CM | POA: Diagnosis not present

## 2023-06-01 DIAGNOSIS — F32A Depression, unspecified: Secondary | ICD-10-CM | POA: Diagnosis not present

## 2023-06-01 DIAGNOSIS — M1712 Unilateral primary osteoarthritis, left knee: Secondary | ICD-10-CM | POA: Diagnosis not present

## 2023-06-01 DIAGNOSIS — Z792 Long term (current) use of antibiotics: Secondary | ICD-10-CM | POA: Diagnosis not present

## 2023-06-01 DIAGNOSIS — R6520 Severe sepsis without septic shock: Secondary | ICD-10-CM | POA: Diagnosis not present

## 2023-06-01 DIAGNOSIS — J189 Pneumonia, unspecified organism: Secondary | ICD-10-CM | POA: Diagnosis not present

## 2023-06-01 DIAGNOSIS — K219 Gastro-esophageal reflux disease without esophagitis: Secondary | ICD-10-CM | POA: Diagnosis not present

## 2023-06-01 DIAGNOSIS — J849 Interstitial pulmonary disease, unspecified: Secondary | ICD-10-CM | POA: Diagnosis not present

## 2023-06-01 DIAGNOSIS — T797XXD Traumatic subcutaneous emphysema, subsequent encounter: Secondary | ICD-10-CM | POA: Diagnosis not present

## 2023-06-01 DIAGNOSIS — M519 Unspecified thoracic, thoracolumbar and lumbosacral intervertebral disc disorder: Secondary | ICD-10-CM | POA: Diagnosis not present

## 2023-06-01 DIAGNOSIS — Z974 Presence of external hearing-aid: Secondary | ICD-10-CM | POA: Diagnosis not present

## 2023-06-01 DIAGNOSIS — I10 Essential (primary) hypertension: Secondary | ICD-10-CM | POA: Diagnosis not present

## 2023-06-01 DIAGNOSIS — F5102 Adjustment insomnia: Secondary | ICD-10-CM | POA: Diagnosis not present

## 2023-06-01 DIAGNOSIS — E782 Mixed hyperlipidemia: Secondary | ICD-10-CM | POA: Diagnosis not present

## 2023-06-01 DIAGNOSIS — Z4803 Encounter for change or removal of drains: Secondary | ICD-10-CM | POA: Diagnosis not present

## 2023-06-01 DIAGNOSIS — K5730 Diverticulosis of large intestine without perforation or abscess without bleeding: Secondary | ICD-10-CM | POA: Diagnosis not present

## 2023-06-01 DIAGNOSIS — Z48815 Encounter for surgical aftercare following surgery on the digestive system: Secondary | ICD-10-CM | POA: Diagnosis not present

## 2023-06-01 DIAGNOSIS — K209 Esophagitis, unspecified without bleeding: Secondary | ICD-10-CM | POA: Diagnosis not present

## 2023-06-01 DIAGNOSIS — K449 Diaphragmatic hernia without obstruction or gangrene: Secondary | ICD-10-CM | POA: Diagnosis not present

## 2023-06-01 DIAGNOSIS — G4733 Obstructive sleep apnea (adult) (pediatric): Secondary | ICD-10-CM | POA: Diagnosis not present

## 2023-06-01 DIAGNOSIS — H409 Unspecified glaucoma: Secondary | ICD-10-CM | POA: Diagnosis not present

## 2023-06-01 DIAGNOSIS — Z9181 History of falling: Secondary | ICD-10-CM | POA: Diagnosis not present

## 2023-06-01 DIAGNOSIS — Z5982 Transportation insecurity: Secondary | ICD-10-CM | POA: Diagnosis not present

## 2023-06-07 DIAGNOSIS — Z5982 Transportation insecurity: Secondary | ICD-10-CM | POA: Diagnosis not present

## 2023-06-07 DIAGNOSIS — Z48815 Encounter for surgical aftercare following surgery on the digestive system: Secondary | ICD-10-CM | POA: Diagnosis not present

## 2023-06-07 DIAGNOSIS — Z4803 Encounter for change or removal of drains: Secondary | ICD-10-CM | POA: Diagnosis not present

## 2023-06-07 DIAGNOSIS — I10 Essential (primary) hypertension: Secondary | ICD-10-CM | POA: Diagnosis not present

## 2023-06-07 DIAGNOSIS — T797XXD Traumatic subcutaneous emphysema, subsequent encounter: Secondary | ICD-10-CM | POA: Diagnosis not present

## 2023-06-07 DIAGNOSIS — G4733 Obstructive sleep apnea (adult) (pediatric): Secondary | ICD-10-CM | POA: Diagnosis not present

## 2023-06-07 DIAGNOSIS — H409 Unspecified glaucoma: Secondary | ICD-10-CM | POA: Diagnosis not present

## 2023-06-07 DIAGNOSIS — M1712 Unilateral primary osteoarthritis, left knee: Secondary | ICD-10-CM | POA: Diagnosis not present

## 2023-06-07 DIAGNOSIS — Z974 Presence of external hearing-aid: Secondary | ICD-10-CM | POA: Diagnosis not present

## 2023-06-07 DIAGNOSIS — Z792 Long term (current) use of antibiotics: Secondary | ICD-10-CM | POA: Diagnosis not present

## 2023-06-07 DIAGNOSIS — F32A Depression, unspecified: Secondary | ICD-10-CM | POA: Diagnosis not present

## 2023-06-07 DIAGNOSIS — E782 Mixed hyperlipidemia: Secondary | ICD-10-CM | POA: Diagnosis not present

## 2023-06-07 DIAGNOSIS — R6520 Severe sepsis without septic shock: Secondary | ICD-10-CM | POA: Diagnosis not present

## 2023-06-07 DIAGNOSIS — M519 Unspecified thoracic, thoracolumbar and lumbosacral intervertebral disc disorder: Secondary | ICD-10-CM | POA: Diagnosis not present

## 2023-06-07 DIAGNOSIS — J189 Pneumonia, unspecified organism: Secondary | ICD-10-CM | POA: Diagnosis not present

## 2023-06-07 DIAGNOSIS — K449 Diaphragmatic hernia without obstruction or gangrene: Secondary | ICD-10-CM | POA: Diagnosis not present

## 2023-06-07 DIAGNOSIS — K5730 Diverticulosis of large intestine without perforation or abscess without bleeding: Secondary | ICD-10-CM | POA: Diagnosis not present

## 2023-06-07 DIAGNOSIS — J849 Interstitial pulmonary disease, unspecified: Secondary | ICD-10-CM | POA: Diagnosis not present

## 2023-06-07 DIAGNOSIS — A419 Sepsis, unspecified organism: Secondary | ICD-10-CM | POA: Diagnosis not present

## 2023-06-07 DIAGNOSIS — K219 Gastro-esophageal reflux disease without esophagitis: Secondary | ICD-10-CM | POA: Diagnosis not present

## 2023-06-07 DIAGNOSIS — K209 Esophagitis, unspecified without bleeding: Secondary | ICD-10-CM | POA: Diagnosis not present

## 2023-06-07 DIAGNOSIS — Z9181 History of falling: Secondary | ICD-10-CM | POA: Diagnosis not present

## 2023-06-07 DIAGNOSIS — Z9049 Acquired absence of other specified parts of digestive tract: Secondary | ICD-10-CM | POA: Diagnosis not present

## 2023-06-07 DIAGNOSIS — F5102 Adjustment insomnia: Secondary | ICD-10-CM | POA: Diagnosis not present

## 2023-06-10 ENCOUNTER — Ambulatory Visit: Payer: Self-pay | Admitting: *Deleted

## 2023-06-10 NOTE — Patient Outreach (Signed)
Care Coordination   Follow Up Visit Note   06/13/2023 Name: Melanie Carrillo MRN: [id number] DOB: [birth year]  Melanie Carrillo is a 88 y.o. year old female who sees [name], Tawanna Cooler, MD for primary care. I spoke with  Warren Lacy and daughter by phone today.  What matters to the patients health and wellness today?  Gall bladder was removed on 9/19, was admitted to hospital 9/20-9/23 with sepsis post surgery.  Both patient and daughter state she is doing well now.  Daughter will be having surgery in January, will have limited ability to help patient with ADL's, but other family will be available. Denies any urgent concerns, encouraged to contact this care manager with questions.    Goals Addressed             This Visit's Progress    Effective management of chronic medical conditions       Interventions Today    Flowsheet Row Most Recent Value  Chronic Disease   Chronic disease during today's visit Hypertension (HTN), Other  [GERD and s/p gall bladder removal]  General Interventions   General Interventions Discussed/Reviewed General Interventions Reviewed, Vaccines, Doctor Visits, Communication with, Durable Medical Equipment (DME)  Vaccines Flu, COVID-19  Doctor Visits Discussed/Reviewed Doctor Visits Reviewed, PCP, Specialist  Durable Medical Equipment (DME) Bed side commode  [need BSC ordered]  PCP/Specialist Visits Compliance with follow-up visit  [seen by PCP on 10/1, released by surgeon on 10/2]  Communication with PCP/Specialists  [Request order for [facility] for patient]  Education Interventions   Education Provided Provided Education  Provided Verbal Education On Nutrition, Medication, When to see the doctor  Nutrition Interventions   Nutrition Discussed/Reviewed Nutrition Reviewed, Decreasing fats  [careful of current diet post gall bladder removal]  Safety Interventions   Safety Discussed/Reviewed Safety Reviewed, Home Safety  Home Safety Assistive Devices               SDOH assessments and interventions completed:  Yes  SDOH Interventions Today    Flowsheet Row Most Recent Value  SDOH Interventions   Food Insecurity Interventions Intervention Not Indicated  Housing Interventions Intervention Not Indicated  Transportation Interventions Intervention Not Indicated        Care Coordination Interventions:  Yes, provided   Follow up plan: Follow up call scheduled for 11/7    Encounter Outcome:  Patient Visit Completed   Kemper Durie RN, MSN, CCM [place]  [facility], [facility] RN Care Coordinator Direct Dial: [phone] / Main [phone] Fax [phone] Email: [email or web address]

## 2023-06-13 DIAGNOSIS — Z974 Presence of external hearing-aid: Secondary | ICD-10-CM | POA: Diagnosis not present

## 2023-06-13 DIAGNOSIS — Z4803 Encounter for change or removal of drains: Secondary | ICD-10-CM | POA: Diagnosis not present

## 2023-06-13 DIAGNOSIS — G4733 Obstructive sleep apnea (adult) (pediatric): Secondary | ICD-10-CM | POA: Diagnosis not present

## 2023-06-13 DIAGNOSIS — E782 Mixed hyperlipidemia: Secondary | ICD-10-CM | POA: Diagnosis not present

## 2023-06-13 DIAGNOSIS — F5102 Adjustment insomnia: Secondary | ICD-10-CM | POA: Diagnosis not present

## 2023-06-13 DIAGNOSIS — J849 Interstitial pulmonary disease, unspecified: Secondary | ICD-10-CM | POA: Diagnosis not present

## 2023-06-13 DIAGNOSIS — F32A Depression, unspecified: Secondary | ICD-10-CM | POA: Diagnosis not present

## 2023-06-13 DIAGNOSIS — Z5982 Transportation insecurity: Secondary | ICD-10-CM | POA: Diagnosis not present

## 2023-06-13 DIAGNOSIS — M519 Unspecified thoracic, thoracolumbar and lumbosacral intervertebral disc disorder: Secondary | ICD-10-CM | POA: Diagnosis not present

## 2023-06-13 DIAGNOSIS — M1712 Unilateral primary osteoarthritis, left knee: Secondary | ICD-10-CM | POA: Diagnosis not present

## 2023-06-13 DIAGNOSIS — Z9049 Acquired absence of other specified parts of digestive tract: Secondary | ICD-10-CM | POA: Diagnosis not present

## 2023-06-13 DIAGNOSIS — J189 Pneumonia, unspecified organism: Secondary | ICD-10-CM | POA: Diagnosis not present

## 2023-06-13 DIAGNOSIS — Z9181 History of falling: Secondary | ICD-10-CM | POA: Diagnosis not present

## 2023-06-13 DIAGNOSIS — K5730 Diverticulosis of large intestine without perforation or abscess without bleeding: Secondary | ICD-10-CM | POA: Diagnosis not present

## 2023-06-13 DIAGNOSIS — K219 Gastro-esophageal reflux disease without esophagitis: Secondary | ICD-10-CM | POA: Diagnosis not present

## 2023-06-13 DIAGNOSIS — I10 Essential (primary) hypertension: Secondary | ICD-10-CM | POA: Diagnosis not present

## 2023-06-13 DIAGNOSIS — A419 Sepsis, unspecified organism: Secondary | ICD-10-CM | POA: Diagnosis not present

## 2023-06-13 DIAGNOSIS — K209 Esophagitis, unspecified without bleeding: Secondary | ICD-10-CM | POA: Diagnosis not present

## 2023-06-13 DIAGNOSIS — Z48815 Encounter for surgical aftercare following surgery on the digestive system: Secondary | ICD-10-CM | POA: Diagnosis not present

## 2023-06-13 DIAGNOSIS — K449 Diaphragmatic hernia without obstruction or gangrene: Secondary | ICD-10-CM | POA: Diagnosis not present

## 2023-06-13 DIAGNOSIS — R6520 Severe sepsis without septic shock: Secondary | ICD-10-CM | POA: Diagnosis not present

## 2023-06-13 DIAGNOSIS — T797XXD Traumatic subcutaneous emphysema, subsequent encounter: Secondary | ICD-10-CM | POA: Diagnosis not present

## 2023-06-13 DIAGNOSIS — H409 Unspecified glaucoma: Secondary | ICD-10-CM | POA: Diagnosis not present

## 2023-06-13 DIAGNOSIS — Z792 Long term (current) use of antibiotics: Secondary | ICD-10-CM | POA: Diagnosis not present

## 2023-06-13 NOTE — Patient Instructions (Signed)
Visit Information  Thank you for taking time to visit with me today. Please don't hesitate to contact me if I can be of assistance to you before our next scheduled telephone appointment.  Our next appointment is by telephone on 11/7  Please call the care guide team at [phone] if you need to cancel or reschedule your appointment.   Please call the Suicide and Crisis Lifeline: 988 call the [place] National Suicide Prevention Lifeline: (571)619-8320 or TTY: 223-818-7584 TTY 325-316-6251) to talk to a trained counselor call 1-800-273-TALK (toll free, 24 hour hotline) call 911 if you are experiencing a Mental Health or Behavioral Health Crisis or need someone to talk to.  Patient verbalizes understanding of instructions and care plan provided today and agrees to view in MyChart. Active MyChart status and patient understanding of how to access instructions and care plan via MyChart confirmed with patient.     The patient has been provided with contact information for the care management team and has been advised to call with any health related questions or concerns.   Kemper Durie RN, MSN, CCM [facility], [facility] Health RN Care Coordinator Direct Dial: [phone] / Main [phone] Fax [phone] Email: [email or web address] Website: [email or web address]

## 2023-06-14 ENCOUNTER — Other Ambulatory Visit: Payer: Self-pay | Admitting: Family Medicine

## 2023-06-14 DIAGNOSIS — M1712 Unilateral primary osteoarthritis, left knee: Secondary | ICD-10-CM

## 2023-06-14 DIAGNOSIS — S72002A Fracture of unspecified part of neck of left femur, initial encounter for closed fracture: Secondary | ICD-10-CM

## 2023-06-14 DIAGNOSIS — R6520 Severe sepsis without septic shock: Secondary | ICD-10-CM

## 2023-06-14 DIAGNOSIS — T8149XA Infection following a procedure, other surgical site, initial encounter: Secondary | ICD-10-CM

## 2023-06-14 DIAGNOSIS — K8044 Calculus of bile duct with chronic cholecystitis without obstruction: Secondary | ICD-10-CM

## 2023-06-14 MED ORDER — OMEPRAZOLE 40 MG PO CPDR: 40.0000 mg | DELAYED_RELEASE_CAPSULE | Freq: Every evening | ORAL | 1 refills | Status: DC

## 2023-06-14 NOTE — Progress Notes (Signed)
Received messaged from [facility] after pt outreach visit requesting refill for omeprazole and bedside commode three in one   Orders placed     Ronnald Ramp, MD  [facility]

## 2023-06-21 DIAGNOSIS — R6520 Severe sepsis without septic shock: Secondary | ICD-10-CM | POA: Diagnosis not present

## 2023-06-21 DIAGNOSIS — Z792 Long term (current) use of antibiotics: Secondary | ICD-10-CM | POA: Diagnosis not present

## 2023-06-21 DIAGNOSIS — T797XXD Traumatic subcutaneous emphysema, subsequent encounter: Secondary | ICD-10-CM | POA: Diagnosis not present

## 2023-06-21 DIAGNOSIS — J189 Pneumonia, unspecified organism: Secondary | ICD-10-CM | POA: Diagnosis not present

## 2023-06-21 DIAGNOSIS — Z974 Presence of external hearing-aid: Secondary | ICD-10-CM | POA: Diagnosis not present

## 2023-06-21 DIAGNOSIS — G4733 Obstructive sleep apnea (adult) (pediatric): Secondary | ICD-10-CM | POA: Diagnosis not present

## 2023-06-21 DIAGNOSIS — Z5982 Transportation insecurity: Secondary | ICD-10-CM | POA: Diagnosis not present

## 2023-06-21 DIAGNOSIS — F32A Depression, unspecified: Secondary | ICD-10-CM | POA: Diagnosis not present

## 2023-06-21 DIAGNOSIS — K449 Diaphragmatic hernia without obstruction or gangrene: Secondary | ICD-10-CM | POA: Diagnosis not present

## 2023-06-21 DIAGNOSIS — A419 Sepsis, unspecified organism: Secondary | ICD-10-CM | POA: Diagnosis not present

## 2023-06-21 DIAGNOSIS — Z4803 Encounter for change or removal of drains: Secondary | ICD-10-CM | POA: Diagnosis not present

## 2023-06-21 DIAGNOSIS — Z9181 History of falling: Secondary | ICD-10-CM | POA: Diagnosis not present

## 2023-06-21 DIAGNOSIS — K219 Gastro-esophageal reflux disease without esophagitis: Secondary | ICD-10-CM | POA: Diagnosis not present

## 2023-06-21 DIAGNOSIS — F5102 Adjustment insomnia: Secondary | ICD-10-CM | POA: Diagnosis not present

## 2023-06-21 DIAGNOSIS — M519 Unspecified thoracic, thoracolumbar and lumbosacral intervertebral disc disorder: Secondary | ICD-10-CM | POA: Diagnosis not present

## 2023-06-21 DIAGNOSIS — I10 Essential (primary) hypertension: Secondary | ICD-10-CM | POA: Diagnosis not present

## 2023-06-21 DIAGNOSIS — E782 Mixed hyperlipidemia: Secondary | ICD-10-CM | POA: Diagnosis not present

## 2023-06-21 DIAGNOSIS — J849 Interstitial pulmonary disease, unspecified: Secondary | ICD-10-CM | POA: Diagnosis not present

## 2023-06-21 DIAGNOSIS — Z9049 Acquired absence of other specified parts of digestive tract: Secondary | ICD-10-CM | POA: Diagnosis not present

## 2023-06-21 DIAGNOSIS — K209 Esophagitis, unspecified without bleeding: Secondary | ICD-10-CM | POA: Diagnosis not present

## 2023-06-21 DIAGNOSIS — M1712 Unilateral primary osteoarthritis, left knee: Secondary | ICD-10-CM | POA: Diagnosis not present

## 2023-06-21 DIAGNOSIS — H409 Unspecified glaucoma: Secondary | ICD-10-CM | POA: Diagnosis not present

## 2023-06-21 DIAGNOSIS — K5730 Diverticulosis of large intestine without perforation or abscess without bleeding: Secondary | ICD-10-CM | POA: Diagnosis not present

## 2023-06-21 DIAGNOSIS — Z48815 Encounter for surgical aftercare following surgery on the digestive system: Secondary | ICD-10-CM | POA: Diagnosis not present

## 2023-06-28 DIAGNOSIS — T797XXD Traumatic subcutaneous emphysema, subsequent encounter: Secondary | ICD-10-CM | POA: Diagnosis not present

## 2023-06-28 DIAGNOSIS — R6520 Severe sepsis without septic shock: Secondary | ICD-10-CM | POA: Diagnosis not present

## 2023-06-28 DIAGNOSIS — A419 Sepsis, unspecified organism: Secondary | ICD-10-CM | POA: Diagnosis not present

## 2023-06-28 DIAGNOSIS — K209 Esophagitis, unspecified without bleeding: Secondary | ICD-10-CM | POA: Diagnosis not present

## 2023-06-28 DIAGNOSIS — K5730 Diverticulosis of large intestine without perforation or abscess without bleeding: Secondary | ICD-10-CM | POA: Diagnosis not present

## 2023-06-28 DIAGNOSIS — K449 Diaphragmatic hernia without obstruction or gangrene: Secondary | ICD-10-CM | POA: Diagnosis not present

## 2023-06-28 DIAGNOSIS — I10 Essential (primary) hypertension: Secondary | ICD-10-CM | POA: Diagnosis not present

## 2023-06-28 DIAGNOSIS — H409 Unspecified glaucoma: Secondary | ICD-10-CM | POA: Diagnosis not present

## 2023-06-28 DIAGNOSIS — J189 Pneumonia, unspecified organism: Secondary | ICD-10-CM | POA: Diagnosis not present

## 2023-06-28 DIAGNOSIS — Z9049 Acquired absence of other specified parts of digestive tract: Secondary | ICD-10-CM | POA: Diagnosis not present

## 2023-06-28 DIAGNOSIS — E782 Mixed hyperlipidemia: Secondary | ICD-10-CM | POA: Diagnosis not present

## 2023-06-28 DIAGNOSIS — Z5982 Transportation insecurity: Secondary | ICD-10-CM | POA: Diagnosis not present

## 2023-06-28 DIAGNOSIS — G4733 Obstructive sleep apnea (adult) (pediatric): Secondary | ICD-10-CM | POA: Diagnosis not present

## 2023-06-28 DIAGNOSIS — Z792 Long term (current) use of antibiotics: Secondary | ICD-10-CM | POA: Diagnosis not present

## 2023-06-28 DIAGNOSIS — M1712 Unilateral primary osteoarthritis, left knee: Secondary | ICD-10-CM | POA: Diagnosis not present

## 2023-06-28 DIAGNOSIS — Z9181 History of falling: Secondary | ICD-10-CM | POA: Diagnosis not present

## 2023-06-28 DIAGNOSIS — F5102 Adjustment insomnia: Secondary | ICD-10-CM | POA: Diagnosis not present

## 2023-06-28 DIAGNOSIS — M519 Unspecified thoracic, thoracolumbar and lumbosacral intervertebral disc disorder: Secondary | ICD-10-CM | POA: Diagnosis not present

## 2023-06-28 DIAGNOSIS — Z4803 Encounter for change or removal of drains: Secondary | ICD-10-CM | POA: Diagnosis not present

## 2023-06-28 DIAGNOSIS — J849 Interstitial pulmonary disease, unspecified: Secondary | ICD-10-CM | POA: Diagnosis not present

## 2023-06-28 DIAGNOSIS — Z974 Presence of external hearing-aid: Secondary | ICD-10-CM | POA: Diagnosis not present

## 2023-06-28 DIAGNOSIS — K219 Gastro-esophageal reflux disease without esophagitis: Secondary | ICD-10-CM | POA: Diagnosis not present

## 2023-06-28 DIAGNOSIS — Z48815 Encounter for surgical aftercare following surgery on the digestive system: Secondary | ICD-10-CM | POA: Diagnosis not present

## 2023-06-28 DIAGNOSIS — F32A Depression, unspecified: Secondary | ICD-10-CM | POA: Diagnosis not present

## 2023-06-30 ENCOUNTER — Ambulatory Visit: Payer: Self-pay | Admitting: *Deleted

## 2023-06-30 NOTE — Patient Outreach (Signed)
  Care Coordination   Follow Up Visit Note   06/30/2023 Name: Melanie Carrillo MRN: [id number] DOB: 08-31-1934  Melanie Carrillo is a 88 y.o. year old female who sees [name], Tawanna Cooler, MD for primary care. I spoke with Melanie Carrillo, daughter of Melanie Carrillo by phone today.  What matters to the patients health and wellness today?  Per daughter, patient still recovering well.  PT has been completed as patient is back to baseline. Daughter denies being contacted by [employer].  Called [employer], they state they have not received order, request it to be resent through [email or web address].     Goals Addressed             This Visit's Progress    Effective management of chronic medical conditions         Interventions Today    Flowsheet Row Most Recent Value  Chronic Disease   Chronic disease during today's visit Hypertension (HTN), Other  [GERD]  General Interventions   General Interventions Discussed/Reviewed General Interventions Reviewed, Durable Medical Equipment (DME), Communication with  Durable Medical Equipment (DME) Bed side commode  Communication with PCP/Specialists  [Called [employer] to follow up on order for [facility], updated PCP office of new referral route for DME]              SDOH assessments and interventions completed:  No     Care Coordination Interventions:  Yes, provided   Follow up plan: Follow up call scheduled for 12/2 with F. McCray, RNCM.    Encounter Outcome:  Patient Visit Completed   Kemper Durie RN, MSN, CCM [place]  [facility], [facility] Health RN Care Coordinator Direct Dial: [phone] / Main [phone] Fax [phone] Email: [email or web address] Website: [email or web address]

## 2023-07-04 ENCOUNTER — Telehealth: Payer: Self-pay

## 2023-07-04 NOTE — Telephone Encounter (Signed)
-----   Message from [facility] [name] sent at 07/01/2023  3:20 PM EST ----- Please order 3-in-1 bedside commode for patient through [employer]  DGX:  Weakness, generalized R53.1 Degenerative Disc Disease [id number] ----- Message ----- From: Kemper Durie, RN Sent: 06/30/2023   5:53 PM EST To: Ronnald Ramp, MD  Hi Dr. Roxan Hockey, I spoke with [employer], they said they didn't receive the order for the 3in1/BSC.  They have been having a lit of IT issues lately and requesting it be sent again through [email or web address] Thanks, Kemper Durie RN, MSN, CCM [facility], [facility] Health RN Care Coordinator Direct Dial: [phone] / Main [phone] Fax [phone] Email: [email or web address] Website: [email or web address]

## 2023-07-05 NOTE — Telephone Encounter (Signed)
Community Message sent today to DME team

## 2023-07-06 DIAGNOSIS — L089 Local infection of the skin and subcutaneous tissue, unspecified: Secondary | ICD-10-CM | POA: Diagnosis not present

## 2023-07-06 DIAGNOSIS — R269 Unspecified abnormalities of gait and mobility: Secondary | ICD-10-CM | POA: Diagnosis not present

## 2023-07-06 DIAGNOSIS — T148XXA Other injury of unspecified body region, initial encounter: Secondary | ICD-10-CM | POA: Diagnosis not present

## 2023-07-25 ENCOUNTER — Other Ambulatory Visit: Payer: Self-pay

## 2023-07-25 NOTE — Patient Instructions (Signed)
Thank you for allowing the  Care Management team to participate in your care. It was great speaking with you today!   Reminders: Please check your blood pressure a few times a week if unable to monitor daily. Please write down the readings to help identify trends and contact the clinic if your readings are not within range. Our next outreach is scheduled for August 26, 2023 at 1100.    Please do not hesitate to call if you require assistance prior to our next appointment.   Kat[facility]  [facility], [facility] Health RN Care Manager Direct Dial: [phone] Website: [email or web address]

## 2023-07-25 NOTE — Patient Outreach (Signed)
  Care Management   Visit Note  07/25/2023 Name: [name] MRN: [id number] DOB: 30-May-1935  Subjective: Melanie Carrillo is a 88 y.o. year old female who is a primary care patient of [name], Tawanna Cooler, MD. The Care Management team was consulted for assistance.      Engaged with Ms. Macgregor via telephone. Reports her daughter/caregiver was busy and not available at the time of the call.  Assessment:  Review of patient past medical history, allergies, medications, health status, including review of consultants reports, laboratory and other test data, was performed as part of  evaluation and provision of care management services.    Outpatient Encounter Medications as of 07/25/2023  Medication Sig   brimonidine (ALPHAGAN) 0.2 % ophthalmic solution Place 1 drop into both eyes in the morning and at bedtime.   Cholecalciferol (VITAMIN D-3) 125 MCG (5000 UT) TABS Take 5,000 Units by mouth in the morning.   feeding supplement (ENSURE ENLIVE / ENSURE PLUS) LIQD Take 237 mLs by mouth 2 (two) times daily between meals.   latanoprost (XALATAN) 0.005 % ophthalmic solution Place 1 drop into both eyes at bedtime.   losartan (COZAAR) 100 MG tablet Take 100 mg by mouth every morning.   lovastatin (MEVACOR) 40 MG tablet Take 40 mg by mouth daily with supper.   montelukast (SINGULAIR) 10 MG tablet Take 1 tablet (10 mg total) by mouth at bedtime.   Multiple Vitamin (MULTIVITAMIN) tablet Take 1 tablet by mouth daily.   omeprazole (PRILOSEC) 40 MG capsule Take 1 capsule (40 mg total) by mouth at bedtime.   sertraline (ZOLOFT) 50 MG tablet Take 1 tablet (50 mg total) by mouth daily.   traZODone (DESYREL) 50 MG tablet Take 0.5-1 tablets (25-50 mg total) by mouth at bedtime as needed.   guaiFENesin (MUCINEX) 600 MG 12 hr tablet Take 600 mg by mouth at bedtime.   simethicone (MYLICON) 80 MG chewable tablet Chew 80 mg by mouth as needed.   No facility-administered encounter medications on file as of  07/25/2023.

## 2023-07-31 NOTE — Patient Outreach (Incomplete)
  Care Management   Visit Note  07/25/2023 Name: Melanie Carrillo MRN: [id number] DOB: [birth year]  Subjective: Melanie Carrillo is a 88 y.o. year old female who is a primary care patient of [name], Tawanna Cooler, MD. The Care Management team was consulted for assistance.      Engaged with Ms. Dowland via telephone. Reports her daughter/caregiver was busy and not available at the time of the call.  Assessment:  Outpatient Encounter Medications as of 07/25/2023  Medication Sig  . brimonidine (ALPHAGAN) 0.2 % ophthalmic solution Place 1 drop into both eyes in the morning and at bedtime.  . Cholecalciferol (VITAMIN D-3) 125 MCG (5000 UT) TABS Take 5,000 Units by mouth in the morning.  . feeding supplement (ENSURE ENLIVE / ENSURE PLUS) LIQD Take 237 mLs by mouth 2 (two) times daily between meals.  . latanoprost (XALATAN) 0.005 % ophthalmic solution Place 1 drop into both eyes at bedtime.  Marland Kitchen losartan (COZAAR) 100 MG tablet Take 100 mg by mouth every morning.  . lovastatin (MEVACOR) 40 MG tablet Take 40 mg by mouth daily with supper.  . montelukast (SINGULAIR) 10 MG tablet Take 1 tablet (10 mg total) by mouth at bedtime.  . Multiple Vitamin (MULTIVITAMIN) tablet Take 1 tablet by mouth daily.  Marland Kitchen omeprazole (PRILOSEC) 40 MG capsule Take 1 capsule (40 mg total) by mouth at bedtime.  . sertraline (ZOLOFT) 50 MG tablet Take 1 tablet (50 mg total) by mouth daily.  . traZODone (DESYREL) 50 MG tablet Take 0.5-1 tablets (25-50 mg total) by mouth at bedtime as needed.  Marland Kitchen guaiFENesin (MUCINEX) 600 MG 12 hr tablet Take 600 mg by mouth at bedtime.  . simethicone (MYLICON) 80 MG chewable tablet Chew 80 mg by mouth as needed.   No facility-administered encounter medications on file as of 07/25/2023.    Interventions:

## 2023-08-26 ENCOUNTER — Other Ambulatory Visit: Payer: Self-pay

## 2023-08-26 DIAGNOSIS — E559 Vitamin D deficiency, unspecified: Secondary | ICD-10-CM | POA: Diagnosis not present

## 2023-08-26 DIAGNOSIS — M810 Age-related osteoporosis without current pathological fracture: Secondary | ICD-10-CM | POA: Diagnosis not present

## 2023-08-26 NOTE — Patient Outreach (Signed)
  Care Management   Visit Note  08/26/2023 Name: Melanie Carrillo MRN: [id number] DOB: May 05, 1935  Subjective: Melanie Carrillo is a 89 y.o. year old female who is a primary care patient of [name], Rockie, MD. The Care Management team was consulted for assistance.     Brief outreach with [name]. She was scheduled for care management outreach today. Requested to reschedule d/t being at a medical appointment at the time of the call.   PLAN Will follow up within the next two weeks.   Jackson [facility]  [facility], [facility] Health RN Care Manager Direct Dial: [phone] Website: [email or web address]

## 2023-09-27 DIAGNOSIS — H40153 Residual stage of open-angle glaucoma, bilateral: Secondary | ICD-10-CM | POA: Diagnosis not present

## 2023-09-27 DIAGNOSIS — H401134 Primary open-angle glaucoma, bilateral, indeterminate stage: Secondary | ICD-10-CM | POA: Diagnosis not present

## 2023-09-27 DIAGNOSIS — H524 Presbyopia: Secondary | ICD-10-CM | POA: Diagnosis not present

## 2023-09-28 DIAGNOSIS — H209 Unspecified iridocyclitis: Secondary | ICD-10-CM | POA: Diagnosis not present

## 2023-09-28 DIAGNOSIS — Z961 Presence of intraocular lens: Secondary | ICD-10-CM | POA: Diagnosis not present

## 2023-09-28 DIAGNOSIS — H401134 Primary open-angle glaucoma, bilateral, indeterminate stage: Secondary | ICD-10-CM | POA: Diagnosis not present

## 2023-09-29 DIAGNOSIS — H401134 Primary open-angle glaucoma, bilateral, indeterminate stage: Secondary | ICD-10-CM | POA: Diagnosis not present

## 2023-09-29 DIAGNOSIS — H209 Unspecified iridocyclitis: Secondary | ICD-10-CM | POA: Diagnosis not present

## 2023-10-03 DIAGNOSIS — H401134 Primary open-angle glaucoma, bilateral, indeterminate stage: Secondary | ICD-10-CM | POA: Diagnosis not present

## 2023-10-03 DIAGNOSIS — H209 Unspecified iridocyclitis: Secondary | ICD-10-CM | POA: Diagnosis not present

## 2023-10-03 DIAGNOSIS — Z961 Presence of intraocular lens: Secondary | ICD-10-CM | POA: Diagnosis not present

## 2023-10-10 DIAGNOSIS — H401134 Primary open-angle glaucoma, bilateral, indeterminate stage: Secondary | ICD-10-CM | POA: Diagnosis not present

## 2023-10-10 DIAGNOSIS — H209 Unspecified iridocyclitis: Secondary | ICD-10-CM | POA: Diagnosis not present

## 2023-10-15 ENCOUNTER — Other Ambulatory Visit: Payer: Self-pay | Admitting: Family Medicine

## 2023-10-15 DIAGNOSIS — F5102 Adjustment insomnia: Secondary | ICD-10-CM

## 2023-10-26 ENCOUNTER — Other Ambulatory Visit: Payer: Self-pay | Admitting: Family Medicine

## 2023-10-26 NOTE — Telephone Encounter (Signed)
 [facility] is requesting refill sertraline (ZOLOFT) 50 MG tablet  Please advise

## 2023-10-27 MED ORDER — SERTRALINE HCL 50 MG PO TABS: 50.0000 mg | ORAL_TABLET | Freq: Every day | ORAL | 1 refills | Status: DC

## 2023-10-27 NOTE — Telephone Encounter (Signed)
 LOV 10*1*24 NOV none LRF 10*1*24 LABS [id number]

## 2023-10-31 DIAGNOSIS — H209 Unspecified iridocyclitis: Secondary | ICD-10-CM | POA: Diagnosis not present

## 2023-10-31 DIAGNOSIS — H40053 Ocular hypertension, bilateral: Secondary | ICD-10-CM | POA: Diagnosis not present

## 2023-10-31 DIAGNOSIS — H401134 Primary open-angle glaucoma, bilateral, indeterminate stage: Secondary | ICD-10-CM | POA: Diagnosis not present

## 2023-11-02 ENCOUNTER — Ambulatory Visit: Payer: Self-pay | Admitting: Family Medicine

## 2023-11-02 NOTE — Telephone Encounter (Signed)
  Chief Complaint: headaches, neck pain constant Symptoms: headaches and neck pain constant, 4/10 pain . Taking ibuprofen , tylenol for pain and trazodone for sleep. Pain worse turning head Frequency: approx 1 month Pertinent Negatives: Patient denies chest pain no difficulty breathing no fever reported. No pain in shoulders no N/T arms or hands. No blurred vision no decrease balance  Disposition: [] ED /[] Urgent Care (no appt availability in office) / [x] Appointment(In office/virtual)/ []  [facility] [facility]/ [] Home Care/ [] Refused Recommended Disposition /[] [place] [facility]/ []  Follow-up with PCP Additional Notes:   Appt scheduled for tomorrow with [facility] due to none available until 11/10/23.       Copied from CRM [id number]. Topic: Clinical - Medical Advice >> Nov 02, 2023  2:07 PM Dondra Prader E wrote: Reason for CRM: Pt called to schedule an appt, nothing available until next week. Seeking medical advice for her headache and neck pain symptoms please advise  Best contact: [phone] Reason for Disposition  [1] MODERATE neck pain (e.g., interferes with normal activities) AND [2] present > 3 days  Answer Assessment - Initial Assessment Questions 1. ONSET: "When did the pain begin?"      Approx 1 month  2. LOCATION: "Where does it hurt?"      Back of neck to headache  3. PATTERN "Does the pain come and go, or has it been constant since it started?"       Constant  4. SEVERITY: "How bad is the pain?"  (Scale 1-10; or mild, moderate, severe)   - NO PAIN (0): no pain or only slight stiffness    - MILD (1-3): doesn't interfere with normal activities    - MODERATE (4-7): interferes with normal activities or awakens from sleep    - SEVERE (8-10):  excruciating pain, unable to do any normal activities      4/10  5. RADIATION: "Does the pain go anywhere else, shoot into your arms?"     No  6. CORD SYMPTOMS: "Any weakness or numbness of the arms or legs?"     Na  7. CAUSE: "What do you  think is causing the neck pain?"     Not sure  8. NECK OVERUSE: "Any recent activities that involved turning or twisting the neck?"     Can turn head but does hurt  9. OTHER SYMPTOMS: "Do you have any other symptoms?" (e.g., headache, fever, chest pain, difficulty breathing, neck swelling)     Headache neck pain constant 10. PREGNANCY: "Is there any chance you are pregnant?" "When was your last menstrual period?"       na  Protocols used: Neck Pain or Stiffness-A-AH

## 2023-11-03 ENCOUNTER — Encounter: Payer: Self-pay | Admitting: Nurse Practitioner

## 2023-11-03 ENCOUNTER — Ambulatory Visit (INDEPENDENT_AMBULATORY_CARE_PROVIDER_SITE_OTHER): Admitting: Nurse Practitioner

## 2023-11-03 VITALS — BP 132/80 | HR 98 | Temp 98.5°F | Resp 18 | Ht 64.0 in | Wt 151.9 lb

## 2023-11-03 DIAGNOSIS — M542 Cervicalgia: Secondary | ICD-10-CM

## 2023-11-03 MED ORDER — TIZANIDINE HCL 4 MG PO TABS: 2.0000 mg | ORAL_TABLET | Freq: Three times a day (TID) | ORAL | 0 refills | Status: DC | PRN

## 2023-11-03 NOTE — Progress Notes (Signed)
 BP 132/80   Pulse 98   Temp 98.5 F (36.9 C)   Resp 18   Ht 5\' 4"  (1.626 m)   Wt 151 lb 14.4 oz (68.9 kg)   LMP  (LMP Unknown)   SpO2 96%   BMI 26.07 kg/m    Subjective:    Patient ID: Melanie Carrillo, female    DOB: [birth year], 89 y.o.   MRN: [id number]  HPI: Melanie Carrillo is a 89 y.o. female  Chief Complaint  Patient presents with   Headache    W/ neck pain for a while woke up this am w/ sore throat better now    Discussed the use of AI scribe software for clinical note transcription with the patient, who gave verbal consent to proceed.  History of Present Illness   The patient presents with neck pain radiating to the head for about a month.  The neck pain is described as tight and sore, worsening with head movement. No recent trauma or fever is reported.  She has been taking two Tylenol and four ibuprofen at night, which previously provided some relief but is no longer effective. Voltaren gel was used with temporary relief but is now ineffective. She has not tried muscle relaxers before.    Denies any fever, numbness or tingling.      07/25/2023    1:23 PM 03/22/2023   10:15 AM 11/26/2022    3:53 PM  Depression screen PHQ 2/9  Decreased Interest 0 0 0  Down, Depressed, Hopeless 0 0 0  PHQ - 2 Score 0 0 0  Altered sleeping   3  Tired, decreased energy   1  Change in appetite   0  Feeling bad or failure about yourself    0  Trouble concentrating   0  Moving slowly or fidgety/restless   0  Suicidal thoughts   0  PHQ-9 Score   4  Difficult doing work/chores   Not difficult at all    Relevant past medical, surgical, family and social history reviewed and updated as indicated. Interim medical history since our last visit reviewed. Allergies and medications reviewed and updated.  Review of Systems  Ten systems reviewed and is negative except as mentioned in HPI      Objective:    BP 132/80   Pulse 98   Temp 98.5 F (36.9 C)   Resp 18   Ht 5\' 4"  (1.626 m)    Wt 151 lb 14.4 oz (68.9 kg)   LMP  (LMP Unknown)   SpO2 96%   BMI 26.07 kg/m    Wt Readings from Last 3 Encounters:  11/03/23 151 lb 14.4 oz (68.9 kg)  05/25/23 144 lb (65.3 kg)  05/24/23 148 lb (67.1 kg)    Physical Exam Vitals reviewed.  Constitutional:      Appearance: Normal appearance.  HENT:     Head: Normocephalic.  Cardiovascular:     Rate and Rhythm: Normal rate.  Pulmonary:     Effort: Pulmonary effort is normal.  Musculoskeletal:     Cervical back: Tenderness present. No signs of trauma, bony tenderness or crepitus. Pain with movement present. Decreased range of motion.  Neurological:     General: No focal deficit present.     Mental Status: She is alert and oriented to person, place, and time. Mental status is at baseline.  Psychiatric:        Mood and Affect: Mood normal.        Behavior:  Behavior normal.        Thought Content: Thought content normal.        Judgment: Judgment normal.     Results for orders placed or performed during the hospital encounter of 05/13/23  Blood Culture (routine x 2)   Collection Time: 05/13/23  1:34 PM   Specimen: BLOOD  Result Value Ref Range   Specimen Description BLOOD BLOOD RIGHT HAND    Special Requests      BOTTLES DRAWN AEROBIC AND ANAEROBIC Blood Culture adequate volume   Culture      NO GROWTH 5 DAYS Performed at [facility], [address] [place], [place] [address]    Report Status 05/18/2023 FINAL   Blood Culture (routine x 2)   Collection Time: 05/13/23  1:34 PM   Specimen: BLOOD  Result Value Ref Range   Specimen Description BLOOD BLOOD LEFT WRIST    Special Requests      BOTTLES DRAWN AEROBIC AND ANAEROBIC Blood Culture adequate volume   Culture      NO GROWTH 5 DAYS Performed at [facility], [address] [place], [place] [address]    Report Status 05/18/2023 FINAL   Lactic acid, plasma   Collection Time: 05/13/23  1:34 PM  Result Value Ref Range   Lactic Acid, Venous  1.9 0.5 - 1.9 mmol/L  Comprehensive metabolic panel   Collection Time: 05/13/23  1:34 PM  Result Value Ref Range   Sodium 130 (L) 135 - 145 mmol/L   Potassium 4.0 3.5 - 5.1 mmol/L   Chloride 97 (L) 98 - 111 mmol/L   CO2 24 22 - 32 mmol/L   Glucose, Bld 143 (H) 70 - 99 mg/dL   BUN 11 8 - 23 mg/dL   Creatinine, Ser 0.98 0.44 - 1.00 mg/dL   Calcium 9.9 8.9 - 11.9 mg/dL   Total Protein 7.1 6.5 - 8.1 g/dL   Albumin 3.0 (L) 3.5 - 5.0 g/dL   AST 58 (H) 15 - 41 U/L   ALT 34 0 - 44 U/L   Alkaline Phosphatase 88 38 - 126 U/L   Total Bilirubin 1.4 (H) 0.3 - 1.2 mg/dL   GFR, Estimated >14 >78 mL/min   Anion gap 9 5 - 15  Protime-INR   Collection Time: 05/13/23  1:34 PM  Result Value Ref Range   Prothrombin Time 16.4 (H) 11.4 - 15.2 seconds   INR 1.3 (H) 0.8 - 1.2  APTT   Collection Time: 05/13/23  1:34 PM  Result Value Ref Range   aPTT 36 24 - 36 seconds  Lipase, blood   Collection Time: 05/13/23  1:34 PM  Result Value Ref Range   Lipase 26 11 - 51 U/L  Resp panel by RT-PCR (RSV, Flu A&B, Covid) Anterior Nasal Swab   Collection Time: 05/13/23  1:58 PM   Specimen: Anterior Nasal Swab  Result Value Ref Range   SARS Coronavirus 2 by RT PCR NEGATIVE NEGATIVE   Influenza A by PCR NEGATIVE NEGATIVE   Influenza B by PCR NEGATIVE NEGATIVE   Resp Syncytial Virus by PCR NEGATIVE NEGATIVE  CBC with Differential/Platelet   Collection Time: 05/13/23  2:30 PM  Result Value Ref Range   WBC 20.2 (H) 4.0 - 10.5 K/uL   RBC 3.47 (L) 3.87 - 5.11 MIL/uL   Hemoglobin 11.8 (L) 12.0 - 15.0 g/dL   HCT 29.5 62.1 - 30.8 %   MCV 106.1 (H) 80.0 - 100.0 fL   MCH 34.0 26.0 -  34.0 pg   MCHC 32.1 30.0 - 36.0 g/dL   RDW 16.1 09.6 - 04.5 %   Platelets 240 150 - 400 K/uL   nRBC 0.1 0.0 - 0.2 %   Neutrophils Relative % 81 %   Neutro Abs 16.5 (H) 1.7 - 7.7 K/uL   Lymphocytes Relative 13 %   Lymphs Abs 2.6 0.7 - 4.0 K/uL   Monocytes Relative 5 %   Monocytes Absolute 0.9 0.1 - 1.0 K/uL   Eosinophils Relative  0 %   Eosinophils Absolute 0.0 0.0 - 0.5 K/uL   Basophils Relative 0 %   Basophils Absolute 0.1 0.0 - 0.1 K/uL   Immature Granulocytes 1 %   Abs Immature Granulocytes 0.12 (H) 0.00 - 0.07 K/uL  Urinalysis, w/ Reflex to Culture (Infection Suspected) -Urine, Clean Catch   Collection Time: 05/13/23  4:00 PM  Result Value Ref Range   Specimen Source URINE, CLEAN CATCH    Color, Urine YELLOW (A) YELLOW   APPearance CLEAR (A) CLEAR   Specific Gravity, Urine 1.016 1.005 - 1.030   pH 6.0 5.0 - 8.0   Glucose, UA NEGATIVE NEGATIVE mg/dL   Hgb urine dipstick NEGATIVE NEGATIVE   Bilirubin Urine NEGATIVE NEGATIVE   Ketones, ur NEGATIVE NEGATIVE mg/dL   Protein, ur NEGATIVE NEGATIVE mg/dL   Nitrite NEGATIVE NEGATIVE   Leukocytes,Ua NEGATIVE NEGATIVE   RBC / HPF 0-5 0 - 5 RBC/hpf   WBC, UA 0-5 0 - 5 WBC/hpf   Bacteria, UA NONE SEEN NONE SEEN   Squamous Epithelial / HPF 0-5 0 - 5 /HPF  Glucose, capillary   Collection Time: 05/13/23  7:08 PM  Result Value Ref Range   Glucose-Capillary 96 70 - 99 mg/dL  MRSA Next Gen by PCR, Nasal   Collection Time: 05/13/23  7:26 PM   Specimen: Nasal Mucosa; Nasal Swab  Result Value Ref Range   MRSA by PCR Next Gen NOT DETECTED NOT DETECTED  Lactic acid, plasma   Collection Time: 05/13/23  7:53 PM  Result Value Ref Range   Lactic Acid, Venous 0.9 0.5 - 1.9 mmol/L  Procalcitonin   Collection Time: 05/13/23  7:53 PM  Result Value Ref Range   Procalcitonin 0.45 ng/mL  Basic metabolic panel   Collection Time: 05/14/23  2:47 AM  Result Value Ref Range   Sodium 131 (L) 135 - 145 mmol/L   Potassium 3.9 3.5 - 5.1 mmol/L   Chloride 97 (L) 98 - 111 mmol/L   CO2 25 22 - 32 mmol/L   Glucose, Bld 98 70 - 99 mg/dL   BUN 7 (L) 8 - 23 mg/dL   Creatinine, Ser 4.09 0.44 - 1.00 mg/dL   Calcium 9.5 8.9 - 81.1 mg/dL   GFR, Estimated >91 >47 mL/min   Anion gap 9 5 - 15  CBC   Collection Time: 05/14/23  2:47 AM  Result Value Ref Range   WBC 18.3 (H) 4.0 - 10.5 K/uL    RBC 3.16 (L) 3.87 - 5.11 MIL/uL   Hemoglobin 10.7 (L) 12.0 - 15.0 g/dL   HCT 82.9 (L) 56.2 - 13.0 %   MCV 103.2 (H) 80.0 - 100.0 fL   MCH 33.9 26.0 - 34.0 pg   MCHC 32.8 30.0 - 36.0 g/dL   RDW 86.5 78.4 - 69.6 %   Platelets 214 150 - 400 K/uL   nRBC 0.0 0.0 - 0.2 %  CBC   Collection Time: 05/16/23  4:25 AM  Result Value Ref Range  WBC 7.9 4.0 - 10.5 K/uL   RBC 2.88 (L) 3.87 - 5.11 MIL/uL   Hemoglobin 10.0 (L) 12.0 - 15.0 g/dL   HCT 86.5 (L) 78.4 - 69.6 %   MCV 102.8 (H) 80.0 - 100.0 fL   MCH 34.7 (H) 26.0 - 34.0 pg   MCHC 33.8 30.0 - 36.0 g/dL   RDW 29.5 28.4 - 13.2 %   Platelets 263 150 - 400 K/uL   nRBC 0.0 0.0 - 0.2 %       Assessment & Plan:   Problem List Items Addressed This Visit   None Visit Diagnoses       Neck pain without injury    -  Primary   Relevant Medications   tiZANidine (ZANAFLEX) 4 MG tablet        Assessment and Plan    Cervical muscle strain Neck pain radiating to the head, described as tight and sore, with pain on movement. No trauma or fever. Pain unresponsive to acetaminophen and ibuprofen, suggesting cervical muscle strain due to muscle tightness. - Prescribe muscle relaxant - Recommend Biofreeze for topical application - Advise use of lidocaine patches as an alternative - Continue acetaminophen and ibuprofen as needed - Suggest combination therapy with muscle relaxant, acetaminophen, ibuprofen, and topical treatments -if no improvement consider physical therapy     Can also do heat therapy    Follow up plan: Return if symptoms worsen or fail to improve, for follow up with pcp.

## 2023-11-03 NOTE — Patient Instructions (Signed)
 Biofreeze or lidocaine patches over the counter

## 2023-11-10 ENCOUNTER — Ambulatory Visit: Admitting: Family Medicine

## 2023-11-14 DIAGNOSIS — B9689 Other specified bacterial agents as the cause of diseases classified elsewhere: Secondary | ICD-10-CM | POA: Diagnosis not present

## 2023-11-14 DIAGNOSIS — Z03818 Encounter for observation for suspected exposure to other biological agents ruled out: Secondary | ICD-10-CM | POA: Diagnosis not present

## 2023-11-14 DIAGNOSIS — J019 Acute sinusitis, unspecified: Secondary | ICD-10-CM | POA: Diagnosis not present

## 2023-11-14 DIAGNOSIS — J209 Acute bronchitis, unspecified: Secondary | ICD-10-CM | POA: Diagnosis not present

## 2023-11-21 DIAGNOSIS — H401134 Primary open-angle glaucoma, bilateral, indeterminate stage: Secondary | ICD-10-CM | POA: Diagnosis not present

## 2023-11-29 DIAGNOSIS — H401111 Primary open-angle glaucoma, right eye, mild stage: Secondary | ICD-10-CM | POA: Diagnosis not present

## 2023-11-29 DIAGNOSIS — H401123 Primary open-angle glaucoma, left eye, severe stage: Secondary | ICD-10-CM | POA: Diagnosis not present

## 2023-11-30 ENCOUNTER — Other Ambulatory Visit: Payer: Self-pay | Admitting: Nurse Practitioner

## 2023-11-30 DIAGNOSIS — M542 Cervicalgia: Secondary | ICD-10-CM

## 2023-11-30 NOTE — Telephone Encounter (Signed)
 Requested medication (s) are due for refill today: Yes  Requested medication (s) are on the active medication list: Yes  Last refill:  11/03/23  Future visit scheduled: No  Notes to clinic:  Unable to refill per protocol, cannot delegate.      Requested Prescriptions  Pending Prescriptions Disp Refills   tiZANidine (ZANAFLEX) 4 MG tablet [Pharmacy Med Name: TIZANIDINE HCL 4 MG TABLET] 90 tablet 0    Sig: Take 0.5-1.5 tablets (2-6 mg total) by mouth every 8 (eight) hours as needed for muscle spasms (muscle tightness).     Not Delegated - Cardiovascular:  Alpha-2 Agonists - tizanidine Failed - 11/30/2023  3:05 PM      Failed - This refill cannot be delegated      Failed - Valid encounter within last 6 months    Recent Outpatient Visits           3 weeks ago Neck pain without injury   [facility] [place] Berniece [place], [place]

## 2023-12-20 ENCOUNTER — Ambulatory Visit: Payer: Self-pay

## 2023-12-20 NOTE — Telephone Encounter (Signed)
 Chief Complaint: Neck pain exacerbation and shakiness Symptoms: see above Frequency: a few months but worsened over last few days and unrelieved by pain medication Pertinent Negatives: Patient denies numbness/weakness of arms/legs Disposition: [] ED /[] Urgent Care (no appt availability in office) / [x] Appointment(In office/virtual)/ []  [place] [facility]/ [] Home Care/ [] Refused Recommended Disposition /[] [place] [facility]/ []  Follow-up with PCP Additional Notes: Patient called in stating she is having an exacerbation of her ongoing neck pain that she has experienced for a few months. Patient was seen by another provider a few months ago and sent home with muscle relaxers but feels she needs re-evaluation and second opinion, and is also experiencing new onset of shakiness and weakness. Appt made for further evaluation.    Reason for Disposition  [1] MODERATE neck pain (e.g., interferes with normal activities) AND [2] present > 3 days  Answer Assessment - Initial Assessment Questions 1. ONSET: "When did the pain begin?"      Since before March 2. LOCATION: "Where does it hurt?"      Back of neck and up into head 3. PATTERN "Does the pain come and go, or has it been constant since it started?"      Constant 4. SEVERITY: "How bad is the pain?"  (Scale 1-10; or mild, moderate, severe)   - NO PAIN (0): no pain or only slight stiffness    - MILD (1-3): doesn't interfere with normal activities    - MODERATE (4-7): interferes with normal activities or awakens from sleep    - SEVERE (8-10):  excruciating pain, unable to do any normal activities      Bad 5. RADIATION: "Does the pain go anywhere else, shoot into your arms?"     Radiates up to head 6. CORD SYMPTOMS: "Any weakness or numbness of the arms or legs?"     No 7. CAUSE: "What do you think is causing the neck pain?"     Patient was seen by FNP and given muscle relaxer  8. NECK OVERUSE: "Any recent activities that involved turning  or twisting the neck?"     No 9. OTHER SYMPTOMS: "Do you have any other symptoms?" (e.g., headache, fever, chest pain, difficulty breathing, neck swelling)     Shakiness, trouble swallowing  Protocols used: Neck Pain or Stiffness-A-AH

## 2023-12-22 ENCOUNTER — Encounter: Payer: Self-pay | Admitting: Family Medicine

## 2023-12-22 ENCOUNTER — Ambulatory Visit
Admission: RE | Admit: 2023-12-22 | Discharge: 2023-12-22 | Disposition: A | Discharge status: Home / Self Care | Source: Ambulatory Visit | Attending: Physician Assistant | Admitting: Physician Assistant

## 2023-12-22 ENCOUNTER — Other Ambulatory Visit: Payer: Self-pay

## 2023-12-22 ENCOUNTER — Emergency Department: Admission: EM | Admit: 2023-12-22 | Discharge: 2023-12-23 | Disposition: A | Discharge status: Home / Self Care

## 2023-12-22 ENCOUNTER — Ambulatory Visit (INDEPENDENT_AMBULATORY_CARE_PROVIDER_SITE_OTHER): Admitting: Physician Assistant

## 2023-12-22 ENCOUNTER — Emergency Department

## 2023-12-22 ENCOUNTER — Ambulatory Visit
Admission: RE | Admit: 2023-12-22 | Discharge: 2023-12-22 | Disposition: A | Discharge status: Home / Self Care | Source: Home / Self Care | Attending: Physician Assistant | Admitting: Physician Assistant

## 2023-12-22 VITALS — BP 157/111 | HR 110 | Ht 64.0 in | Wt 150.0 lb

## 2023-12-22 DIAGNOSIS — R918 Other nonspecific abnormal finding of lung field: Secondary | ICD-10-CM | POA: Diagnosis not present

## 2023-12-22 DIAGNOSIS — R0602 Shortness of breath: Secondary | ICD-10-CM | POA: Insufficient documentation

## 2023-12-22 DIAGNOSIS — M40204 Unspecified kyphosis, thoracic region: Secondary | ICD-10-CM | POA: Diagnosis not present

## 2023-12-22 DIAGNOSIS — M47812 Spondylosis without myelopathy or radiculopathy, cervical region: Secondary | ICD-10-CM | POA: Diagnosis not present

## 2023-12-22 DIAGNOSIS — I159 Secondary hypertension, unspecified: Secondary | ICD-10-CM | POA: Diagnosis not present

## 2023-12-22 DIAGNOSIS — R002 Palpitations: Secondary | ICD-10-CM | POA: Diagnosis not present

## 2023-12-22 DIAGNOSIS — M542 Cervicalgia: Secondary | ICD-10-CM

## 2023-12-22 DIAGNOSIS — R251 Tremor, unspecified: Secondary | ICD-10-CM | POA: Diagnosis not present

## 2023-12-22 DIAGNOSIS — R5383 Other fatigue: Secondary | ICD-10-CM | POA: Diagnosis not present

## 2023-12-22 DIAGNOSIS — I499 Cardiac arrhythmia, unspecified: Secondary | ICD-10-CM

## 2023-12-22 DIAGNOSIS — I6523 Occlusion and stenosis of bilateral carotid arteries: Secondary | ICD-10-CM | POA: Diagnosis not present

## 2023-12-22 DIAGNOSIS — I10 Essential (primary) hypertension: Secondary | ICD-10-CM

## 2023-12-22 DIAGNOSIS — I700 Atherosclerosis of aorta: Secondary | ICD-10-CM | POA: Diagnosis not present

## 2023-12-22 DIAGNOSIS — R0989 Other specified symptoms and signs involving the circulatory and respiratory systems: Secondary | ICD-10-CM

## 2023-12-22 DIAGNOSIS — R030 Elevated blood-pressure reading, without diagnosis of hypertension: Secondary | ICD-10-CM

## 2023-12-22 DIAGNOSIS — R519 Headache, unspecified: Secondary | ICD-10-CM | POA: Diagnosis present

## 2023-12-22 DIAGNOSIS — J849 Interstitial pulmonary disease, unspecified: Secondary | ICD-10-CM | POA: Diagnosis not present

## 2023-12-22 DIAGNOSIS — E041 Nontoxic single thyroid nodule: Secondary | ICD-10-CM | POA: Diagnosis not present

## 2023-12-22 LAB — CBC WITH DIFFERENTIAL/PLATELET
Abs Immature Granulocytes: 0.03 10*3/uL (ref 0.00–0.07)
Basophils Absolute: 0.1 10*3/uL (ref 0.0–0.1)
Basophils Relative: 1 %
Eosinophils Absolute: 0.2 10*3/uL (ref 0.0–0.5)
Eosinophils Relative: 2 %
HCT: 37.8 % (ref 36.0–46.0)
Hemoglobin: 12.4 g/dL (ref 12.0–15.0)
Immature Granulocytes: 0 %
Lymphocytes Relative: 20 %
Lymphs Abs: 2 10*3/uL (ref 0.7–4.0)
MCH: 34.3 pg — ABNORMAL HIGH (ref 26.0–34.0)
MCHC: 32.8 g/dL (ref 30.0–36.0)
MCV: 104.7 fL — ABNORMAL HIGH (ref 80.0–100.0)
Monocytes Absolute: 0.7 10*3/uL (ref 0.1–1.0)
Monocytes Relative: 7 %
Neutro Abs: 7 10*3/uL (ref 1.7–7.7)
Neutrophils Relative %: 70 %
Platelets: 268 10*3/uL (ref 150–400)
RBC: 3.61 MIL/uL — ABNORMAL LOW (ref 3.87–5.11)
RDW: 14.4 % (ref 11.5–15.5)
WBC: 10 10*3/uL (ref 4.0–10.5)
nRBC: 0 % (ref 0.0–0.2)

## 2023-12-22 LAB — BASIC METABOLIC PANEL WITH GFR
Anion gap: 11 (ref 5–15)
BUN: 23 mg/dL (ref 8–23)
CO2: 15 mmol/L — ABNORMAL LOW (ref 22–32)
Calcium: 10.3 mg/dL (ref 8.9–10.3)
Chloride: 104 mmol/L (ref 98–111)
Creatinine, Ser: 0.59 mg/dL (ref 0.44–1.00)
GFR, Estimated: >60 mL/min (ref >60)
Glucose, Bld: 112 mg/dL — ABNORMAL HIGH (ref 70–99)
Potassium: 3.7 mmol/L (ref 3.5–5.1)
Sodium: 130 mmol/L — ABNORMAL LOW (ref 135–145)

## 2023-12-22 MED ORDER — METOPROLOL TARTRATE 25 MG PO TABS
12.5000 mg | ORAL_TABLET | Freq: Once | ORAL | Status: AC
  Administered 2023-12-23: 12.5 mg via ORAL
  Filled 2023-12-22: qty 1

## 2023-12-22 MED ORDER — LIDOCAINE 5 % EX PTCH: 1.0000 | MEDICATED_PATCH | CUTANEOUS | 0 refills | Status: AC

## 2023-12-22 MED ORDER — KETOROLAC TROMETHAMINE 15 MG/ML IJ SOLN
15.0000 mg | Freq: Once | INTRAMUSCULAR | Status: AC
  Administered 2023-12-22: 15 mg via INTRAVENOUS
  Filled 2023-12-22: qty 1

## 2023-12-22 MED ORDER — SODIUM CHLORIDE 0.9 % IV BOLUS
500.0000 mL | Freq: Once | INTRAVENOUS | Status: AC
  Administered 2023-12-22: 500 mL via INTRAVENOUS

## 2023-12-22 MED ORDER — METOPROLOL TARTRATE 5 MG/5ML IV SOLN
5.0000 mg | Freq: Once | INTRAVENOUS | Status: AC
  Administered 2023-12-22: 5 mg via INTRAVENOUS
  Filled 2023-12-22: qty 5

## 2023-12-22 MED ORDER — METOPROLOL TARTRATE 25 MG PO TABS: 12.5000 mg | ORAL_TABLET | Freq: Two times a day (BID) | ORAL | 0 refills | Status: DC

## 2023-12-22 MED ORDER — LIDOCAINE 5 % EX PTCH
1.0000 | MEDICATED_PATCH | CUTANEOUS | Status: DC
  Administered 2023-12-22: 1 via TRANSDERMAL
  Filled 2023-12-22: qty 1

## 2023-12-22 MED ORDER — HYDROCHLOROTHIAZIDE 12.5 MG PO CAPS: 12.5000 mg | ORAL_CAPSULE | Freq: Every day | ORAL | 1 refills | Status: DC

## 2023-12-22 MED ORDER — METOCLOPRAMIDE HCL 5 MG/ML IJ SOLN
10.0000 mg | Freq: Once | INTRAMUSCULAR | Status: AC
  Administered 2023-12-22: 10 mg via INTRAVENOUS
  Filled 2023-12-22: qty 2

## 2023-12-22 NOTE — ED Provider Triage Note (Signed)
 Emergency Medicine Provider Triage Evaluation Note  Melanie Carrillo , a 89 y.o. female  was evaluated in triage.  Pt complains of neck pain that begins in her neck and radiates up into the head. Patient was advised to come to the ED for pain meds. Pain has been on going for 4 months. She has tried a muscle relaxer, ibuprofen and tylenol  which has provided some relief. Patient also reports SOB and had a chest xray done today before coming to the ED.  Review of Systems  Positive:  Negative:   Physical Exam  LMP  (LMP Unknown)  Gen:   Awake, no distress   Resp:  Normal effort  MSK:   Moves extremities without difficulty  Other:    Medical Decision Making  Medically screening exam initiated at 5:35 PM.  Appropriate orders placed.  MILAN CARPINELLI was informed that the remainder of the evaluation will be completed by another provider, this initial triage assessment does not replace that evaluation, and the importance of remaining in the ED until their evaluation is complete.     Phyliss Breen, PA-C 12/22/23 1742

## 2023-12-22 NOTE — Discharge Instructions (Signed)
 Your evaluation in the emergency department is overall reassuring.  We saw no concerning findings related to your headache, but your blood pressure was very elevated here.  This improved with treatment, and I have started you on a new blood pressure medication-please take this as prescribed in addition to your regular medications.  You can use Tylenol  and Motrin as needed for any recurrent headache, and I have also prescribed you Lidoderm  patches for your neck and headache.  Please do follow-up with your primary care doctor, and return to the emergency department with any new or worsening symptoms.  You continue to have similar symptoms, you could consider outpatient referral to a neurologist or pain specialist.

## 2023-12-22 NOTE — ED Notes (Signed)
 Cam Cava, MD notified of pts VS and that pt wants to see doctor.

## 2023-12-22 NOTE — ED Triage Notes (Signed)
 Patient sent by PCP for neck pain. Patient reports it has been ongoing for four months but has been worsening. Also reports headache

## 2023-12-22 NOTE — ED Notes (Signed)
 Assisted with walker to restroom, nadn

## 2023-12-22 NOTE — Progress Notes (Signed)
 Established patient visit  Patient: Melanie Carrillo   DOB: [birth year]   89 y.o. Female  MRN: [id number] Visit Date: 12/22/2023  Today's healthcare provider: Blane Bunting, PA-C   Chief Complaint  Patient presents with   Neck Pain       Tremors  Subjective     HPI     Neck Pain    Additional comments: Patient reports having worsening neck pain.  Pain radiates up into her head.  She reports having the headaches every day and lasts all day long.  She was put on muscle bur states it did not really help any. Her worst pain is at a 5-6/10.         Tremors    Additional comments: Bilateral hand shaking for about 1 month.  They have also noticed leg shaking when sitting on the shower chair.  She denies numbness or tingling but reports new symptom of weakness.  She states the muscle relaxer helps with the hands shaking. No report of restless leg at night.      Last edited by Desanto, Elena D, CMA on 12/22/2023  3:55 PM.       Discussed the use of AI scribe software for clinical note transcription with the patient, who gave verbal consent to proceed.  History of Present Illness Melanie Carrillo is an 89 year old female with hypertension who presents with high blood pressure and tremors. She is accompanied by her daughter.  Blood pressure remains elevated despite losartan . Tremors began one month ago, accompanied by weakness and a recent fall. She feels unstable, particularly in the shower, and requires assistance. Tremors are not associated with anxiety or medication changes.  Fatigue and lack of energy are present, with shortness of breath on exertion, worsening compared to previous episodes. No chest pain but shortness of breath is reported.  Neck pain has persisted for four months, described as occipital headache radiating from the neck, suggestive of cervical radiculopathy. Has been worsening recently. No recent trauma, but a fall two years ago resulted in a femur fracture.        12/22/2023    3:56 PM 07/25/2023    1:23 PM 03/22/2023   10:15 AM  Depression screen PHQ 2/9  Decreased Interest 0 0 0  Down, Depressed, Hopeless 1 0 0  PHQ - 2 Score 1 0 0  Altered sleeping 2    Tired, decreased energy 3    Change in appetite 0    Feeling bad or failure about yourself  0    Trouble concentrating 0    Moving slowly or fidgety/restless 0    Suicidal thoughts 0    PHQ-9 Score 6    Difficult doing work/chores Not difficult at all        12/22/2023    3:57 PM  GAD 7 : Generalized Anxiety Score  Nervous, Anxious, on Edge 0  Control/stop worrying 0  Worry too much - different things 0  Trouble relaxing 0  Restless 0  Easily annoyed or irritable 0  Afraid - awful might happen 0  Total GAD 7 Score 0  Anxiety Difficulty Not difficult at all    Medications: Outpatient Medications Prior to Visit  Medication Sig   brimonidine  (ALPHAGAN ) 0.2 % ophthalmic solution Place 1 drop into both eyes in the morning and at bedtime.   Cholecalciferol  (VITAMIN D -3) 125 MCG (5000 UT) TABS Take 5,000 Units by mouth in the morning.   feeding supplement (ENSURE ENLIVE / ENSURE PLUS)  LIQD Take 237 mLs by mouth 2 (two) times daily between meals.   guaiFENesin  (MUCINEX ) 600 MG 12 hr tablet Take 600 mg by mouth at bedtime.   latanoprost  (XALATAN ) 0.005 % ophthalmic solution Place 1 drop into both eyes at bedtime.   losartan  (COZAAR ) 100 MG tablet Take 100 mg by mouth daily.   lovastatin (MEVACOR) 40 MG tablet TAKE 1 TABLET (40 MG TOTAL) BY MOUTH DAILY WITH DINNER FOR 90 DAYS   montelukast  (SINGULAIR ) 10 MG tablet Take 1 tablet (10 mg total) by mouth at bedtime.   Multiple Vitamin (MULTIVITAMIN) tablet Take 1 tablet by mouth daily.   omeprazole  (PRILOSEC) 40 MG capsule Take 1 capsule (40 mg total) by mouth at bedtime.   sertraline  (ZOLOFT ) 50 MG tablet Take 1 tablet (50 mg total) by mouth daily.   simethicone  (MYLICON) 80 MG chewable tablet Chew 80 mg by mouth as needed.   tiZANidine   (ZANAFLEX ) 4 MG tablet TAKE 0.5-1.5 TABLETS (2-6 MG TOTAL) BY MOUTH EVERY 8 (EIGHT) HOURS AS NEEDED FOR MUSCLE SPASMS (MUSCLE TIGHTNESS).   traZODone  (DESYREL ) 50 MG tablet TAKE 0.5-1 TABLETS (25-50 MG TOTAL) BY MOUTH AT BEDTIME AS NEEDED.   [DISCONTINUED] losartan  (COZAAR ) 100 MG tablet Take 100 mg by mouth daily.   [DISCONTINUED] losartan  (COZAAR ) 100 MG tablet TAKE 1 TABLET (100 MG TOTAL) BY MOUTH ONCE DAILY FOR 90 DAYS   No facility-administered medications prior to visit.    Review of Systems All negative Except see HPI       Objective    BP (!) 157/111 (BP Location: Left Arm, Patient Position: Sitting, Cuff Size: Normal)   Pulse (!) 110   Ht 5\' 4"  (1.626 m)   Wt 150 lb (68 kg)   LMP  (LMP Unknown)   SpO2 95%   BMI 25.75 kg/m   Vitals:   12/22/23 1601 12/22/23 1602  BP: (!) 209/131 (!) 157/111  Pulse: (!) 110   SpO2: 95%     Physical Exam Vitals reviewed.  Constitutional:      General: She is in acute distress.     Appearance: Normal appearance. She is well-developed. She is not diaphoretic.  HENT:     Head: Normocephalic and atraumatic.     Right Ear: Ear canal and external ear normal. There is impacted cerumen.     Left Ear: Ear canal and external ear normal.     Nose: Congestion and rhinorrhea present.     Mouth/Throat:     Comments: Postnasal drainage Eyes:     General: No scleral icterus.    Extraocular Movements: Extraocular movements intact.     Conjunctiva/sclera: Conjunctivae normal.     Pupils: Pupils are equal, round, and reactive to light.  Neck:     Thyroid : No thyromegaly.  Cardiovascular:     Rate and Rhythm: Tachycardia present. Rhythm irregular.  Pulmonary:     Effort: Pulmonary effort is normal. No respiratory distress.     Breath sounds: Rhonchi present. No wheezing or rales.  Abdominal:     General: Bowel sounds are normal. There is no distension.     Tenderness: There is no abdominal tenderness.  Musculoskeletal:     Cervical back:  Neck supple. Tenderness (back of the neck) present.     Right lower leg: No edema.     Left lower leg: No edema.  Lymphadenopathy:     Cervical: No cervical adenopathy.  Skin:    General: Skin is warm and dry.     Findings:  No rash.  Neurological:     Mental Status: She is alert and oriented to person, place, and time. Mental status is at baseline.  Psychiatric:        Mood and Affect: Mood normal.        Behavior: Behavior normal.    Ambulated with walker No results found for any visits on 12/22/23.      Assessment & Plan Hypertension chronic Hypertension poorly controlled at 157/111 mmHg and 209/131 on a different arm, HR of 110 A difference in BP between the right and left arms of more than 20mm hg, Presence of severe neck/back pain/migratory pain and occipital headache, tremor could be suggestive of aortic dissection.  Pt was advised to proceed to ED. Triage nurse at [facility] ED was contacted and briefed. B blocker vs Ca channel blocker should be added  Palpitation/ arrhythmias EKG showed sinus tachycardia with premature atrial and ventricular complexes, LV hypertrophy comparison with EKG from 05/13/23 Needs further cardiovascular assessment   Fatigue Fatigue possibly related to aortic dissection, hypertension, interstitial lung disease, and recent falls, neck pain. Further evaluation required.  Tremor Tremor with weakness . Could occur due to cerebral or spinal cord compromise in aortic dissection/DDX.  Neck pain Acute on chronic due to unclear etiology Neck pain with occipital headache and numbness could be due to cervical radiculopathy, AD still on differential Hx of recent fall Needs a further assessment  Interstitial Lung Disease Interstitial lung disease with exertional shortness of breath. Further evaluation needed. - Order chest X-ray to evaluate lung condition. - Perform EKG for cardiac irregularities related to shortness of breath and palpitations.  All  orders were cancelled until ED evaluation  No orders of the defined types were placed in this encounter.   No follow-ups on file.   The patient was advised to call back or seek an in-person evaluation if the symptoms worsen or if the condition fails to improve as anticipated.  I discussed the assessment and treatment plan with the patient, pt's daughter and pt's pcp. The patient was provided an opportunity to ask questions and all were answered. The patient agreed with the plan and demonstrated an understanding of the instructions.  I, Lashann Hagg, PA-C have reviewed all documentation for this visit. The documentation on 12/22/2023  for the exam, diagnosis, procedures, and orders are all accurate and complete.  Blane Bunting, [facility], MMS [facility] [phone] (phone) [phone] (fax)  [facility]

## 2023-12-22 NOTE — ED Provider Notes (Signed)
 [facility] Provider Note    Event Date/Time   First MD Initiated Contact with Patient 12/22/23 2125     (approximate)   History   Neck Pain  Patient sent by PCP for neck pain. Patient reports it has been ongoing for four months but has been worsening. Also reports headache     HPI Melanie Carrillo is a 89 y.o. female  ILD, degenerative disc disease, GERD since for evaluation of neck pain and headache -Chronic for several months, worsening.  Seen in clinic, and recommended ED eval. -Took Tylenol  earlier today with minimal relief, often takes Motrin but had not taken any today -Localizes to left posterior neck and left posterior scalp -No preceding trauma     Physical Exam   Triage Vital Signs: ED Triage Vitals  Encounter Vitals Group     BP 12/22/23 1735 (!) 118/111     Systolic BP Percentile --      Diastolic BP Percentile --      Pulse Rate 12/22/23 1735 (!) 125     Resp 12/22/23 1735 17     Temp 12/22/23 1735 98.6 F (37 C)     Temp Source 12/22/23 1735 Oral     SpO2 12/22/23 1735 94 %     Weight 12/22/23 1736 150 lb (68 kg)     Height 12/22/23 1736 5\' 4"  (1.626 m)     Head Circumference --      Peak Flow --      Pain Score 12/22/23 1739 6     Pain Loc --      Pain Education --      Exclude from Growth Chart --     Most recent vital signs: Vitals:   12/23/23 0019 12/23/23 0031  BP: (!) 207/98 (!) 168/98  Pulse: 97 99  Resp:  18  Temp:  98 F (36.7 C)  SpO2:  97%     General: Awake, no distress.  Head:  Cephalic, atraumatic Neck:  + Left paraspinal tenderness, no midline tenderness, normal range of motion, no meningismus CV:  Good peripheral perfusion. RRR, RP 2+ Resp:  Normal effort. CTAB Abd:  No distention. Nontender to deep palpation throughout    ED Results / Procedures / Treatments   Labs (all labs ordered are listed, but only abnormal results are displayed) Labs Reviewed  BASIC METABOLIC PANEL WITH GFR -  Abnormal; Notable for the following components:      Result Value   Sodium 130 (*)    CO2 15 (*)    Glucose, Bld 112 (*)    All other components within normal limits  CBC WITH DIFFERENTIAL/PLATELET - Abnormal; Notable for the following components:   RBC 3.61 (*)    MCV 104.7 (*)    MCH 34.3 (*)    All other components within normal limits     EKG  Ecg = sinus tachycardia, rate 117, no gross ST elevation or depression, no significant repolarization abnormality, normal axis, left axis deviation, normal intervals.  Several PVCs present.   RADIOLOGY Radiology interpreted by myself and radiology reports reviewed.  No evidence of acute pathology.   PROCEDURES:  Critical Care performed: No  Procedures   MEDICATIONS ORDERED IN ED: Medications  lidocaine  (LIDODERM ) 5 % 1 patch (1 patch Transdermal Patch Applied 12/22/23 2322)  metoCLOPramide  (REGLAN ) injection 10 mg (10 mg Intravenous Given 12/22/23 2237)  sodium chloride  0.9 % bolus 500 mL (0 mLs Intravenous Stopped 12/22/23 2304)  ketorolac  (TORADOL )  15 MG/ML injection 15 mg (15 mg Intravenous Given 12/22/23 2237)  metoprolol  tartrate (LOPRESSOR ) injection 5 mg (5 mg Intravenous Given 12/22/23 2322)  metoprolol  tartrate (LOPRESSOR ) tablet 12.5 mg (12.5 mg Oral Given 12/23/23 0019)     IMPRESSION / MDM / ASSESSMENT AND PLAN / ED COURSE  I reviewed the triage vital signs and the nursing notes.                              DDX/MDM/AP: Differential diagnosis includes, but is not limited to, benign headache type including tension or migraine, consider intracranial hemorrhage.    Plan: - CT head, C-spine - Labs - Pain control  Patient's presentation is most consistent with acute presentation with potential threat to life or bodily function.  The patient is on the cardiac monitor to evaluate for evidence of arrhythmia and/or significant heart rate changes.  ED course below.  CT imaging unremarkable, laboratory workup overall  unremarkable beyond mild hyponatremia and mildly low bicarb.  Given small bolus IV fluid, Reglan , Toradol  with some improvement of headache.  Did have persistent notable hypertension with systolic in the 200s however as well as some persistent tachycardia at times up to the 130s--unclear how much of this is due to discomfort and pain.  Received single dose 5 IV metoprolol  with excellent response, blood pressure improved to systolic of 160s, and heart rate normalized to 80s.  is on losartan  for blood pressure control as outpatient, will add low-dose metoprolol .  Regard to her chronic neck and headaches, recommend PMD follow-up, can continue Tylenol  and Motrin as needed, can consider outpatient referral to neurology or pain management as needed.  ED return precautions in place.  Patient daughter at bedside agree with plan.  Clinical Course as of 12/23/23 0104  Thu Dec 22, 2023  2215 BMP reviewed, overall unremarkable  CBC unremarkable [MM]  2216 CTH, CT Cspine: IMPRESSION: 1. No acute intracranial abnormality. 2. No acute displaced fracture or traumatic listhesis of the cervical spine. 3. Heterogeneous lobular left thyroid  gland with underlying hypodense nodule measuring up to 1.4 cm. No further follow-up indicated.   [MM]    Clinical Course User Index [MM] Collis Deaner, MD     FINAL CLINICAL IMPRESSION(S) / ED DIAGNOSES   Final diagnoses:  Neck pain  Intractable headache, unspecified chronicity pattern, unspecified headache type  Secondary hypertension     Rx / DC Orders   ED Discharge Orders          Ordered    metoprolol  tartrate (LOPRESSOR ) 25 MG tablet  2 times daily        12/22/23 2357    lidocaine  (LIDODERM ) 5 %  Every 24 hours        12/22/23 2359             Note:  This document was prepared using Dragon voice recognition software and may include unintentional dictation errors.   Collis Deaner, MD 12/23/23 [phone]

## 2023-12-23 ENCOUNTER — Encounter: Payer: Self-pay | Admitting: Physician Assistant

## 2023-12-23 ENCOUNTER — Other Ambulatory Visit: Payer: Self-pay | Admitting: Family Medicine

## 2023-12-23 DIAGNOSIS — M542 Cervicalgia: Secondary | ICD-10-CM

## 2023-12-23 MED ORDER — TIZANIDINE HCL 4 MG PO TABS: 2.0000 mg | ORAL_TABLET | Freq: Three times a day (TID) | ORAL | 0 refills | Status: DC | PRN

## 2023-12-23 NOTE — Telephone Encounter (Signed)
Please see the message below and advise.

## 2023-12-26 ENCOUNTER — Telehealth: Payer: Self-pay

## 2023-12-26 NOTE — Transitions of Care (Post Inpatient/ED Visit) (Signed)
 12/26/2023  Name: Melanie Carrillo MRN: [id number] DOB: 1935-06-19  Today's TOC FU Call Status: Today's TOC FU Call Status:: Successful TOC FU Call Completed TOC FU Call Complete Date: 12/26/23 Patient's Name and Date of Birth confirmed.  Transition Care Management Follow-up Telephone Call Date of Discharge: 12/23/23 Discharge Facility: [facility]) Type of Discharge: Emergency Department Reason for ED Visit: Other: (headache) How have you been since you were released from the hospital?: Better Any questions or concerns?: No  Items Reviewed: Did you receive and understand the discharge instructions provided?: Yes Medications obtained,verified, and reconciled?: Yes (Medications Reviewed) Any new allergies since your discharge?: No Dietary orders reviewed?: Yes Do you have support at home?: Yes People in Home [RPT]: child(ren), adult  Medications Reviewed Today: Medications Reviewed Today     Reviewed by Darrall Ellison, LPN (Licensed Practical Nurse) on 12/26/23 at 1533  Med List Status: <None>   Medication Order Taking? Sig Documenting Provider Last Dose Status Informant  brimonidine  (ALPHAGAN ) 0.2 % ophthalmic solution [id number] No Place 1 drop into both eyes in the morning and at bedtime. [provider] Taking Active Child, Pharmacy Records           Med Note Etha Henle Jun 21, 2022  2:43 PM)    Cholecalciferol  (VITAMIN D -3) 125 MCG (5000 UT) TABS [id number] No Take 5,000 Units by mouth in the morning. [provider] Taking Active Child, Pharmacy Records  feeding supplement (ENSURE ENLIVE / ENSURE PLUS) LIQD [id number] No Take 237 mLs by mouth 2 (two) times daily between meals. Melvinia Stager, MD Taking Active   guaiFENesin  (MUCINEX ) 600 MG 12 hr tablet [id number] No Take 600 mg by mouth at bedtime. [provider] Taking Active Child, Pharmacy Records  latanoprost  (XALATAN ) 0.005 % ophthalmic solution [id number] No  Place 1 drop into both eyes at bedtime. [provider] Taking Active Child, Pharmacy Records  lidocaine  (LIDODERM ) 5 % [id number]  Place 1 patch onto the skin daily for 10 days. Remove & Discard patch within 12 hours or as directed by MD Collis Deaner, MD  Active   losartan  (COZAAR ) 100 MG tablet [id number] No Take 100 mg by mouth daily. [name], Makiera, MD Taking Active   lovastatin (MEVACOR) 40 MG tablet [id number] No TAKE 1 TABLET (40 MG TOTAL) BY MOUTH DAILY WITH DINNER FOR 90 DAYS [name], Makiera, MD Taking Active   metoprolol  tartrate (LOPRESSOR ) 25 MG tablet [id number]  Take 0.5 tablets (12.5 mg total) by mouth 2 (two) times daily. Collis Deaner, MD  Active   montelukast  (SINGULAIR ) 10 MG tablet [id number] No Take 1 tablet (10 mg total) by mouth at bedtime. [name], Judyann Number, MD Taking Active Child, Pharmacy Records  Multiple Vitamin (MULTIVITAMIN) tablet [id number] No Take 1 tablet by mouth daily. [provider] Taking Active Child, Pharmacy Records  omeprazole  (PRILOSEC) 40 MG capsule [id number] No Take 1 capsule (40 mg total) by mouth at bedtime. [name], Makiera, MD Taking Active   sertraline  (ZOLOFT ) 50 MG tablet [id number] No Take 1 tablet (50 mg total) by mouth daily. [name], Makiera, MD Taking Active   simethicone  (MYLICON) 80 MG chewable tablet [id number] No Chew 80 mg by mouth as needed. [provider] Taking Active Child, Pharmacy Records  tiZANidine  (ZANAFLEX ) 4 MG tablet [id number]  Take 0.5-1.5 tablets (2-6 mg total) by mouth every 8 (eight) hours as needed for muscle spasms (muscle tightness). [name], Makiera, MD  Active   traZODone  (DESYREL ) 50 MG tablet  [id number] No TAKE 0.5-1 TABLETS (25-50 MG TOTAL) BY MOUTH AT BEDTIME AS NEEDED. [name], Judyann Number, MD Taking Active             Home Care and Equipment/Supplies: Were Home Health Services Ordered?: NA Any new equipment or medical  supplies ordered?: NA  Functional Questionnaire: Do you need assistance with bathing/showering or dressing?: No Do you need assistance with meal preparation?: No Do you need assistance with eating?: No Do you have difficulty maintaining continence: No Do you need assistance with getting out of bed/getting out of a chair/moving?: No Do you have difficulty managing or taking your medications?: No  Follow up appointments reviewed: PCP Follow-up appointment confirmed?: Yes Date of PCP follow-up appointment?: 01/05/24 (patient already had this appt time, didn't want sooner appt) Follow-up Provider: [facility] Follow-up appointment confirmed?: NA Do you need transportation to your follow-up appointment?: No Do you understand care options if your condition(s) worsen?: Yes-patient verbalized understanding    SIGNATURE Darrall Ellison, LPN [facility] Nurse Health Advisor Direct Dial [phone]

## 2023-12-29 ENCOUNTER — Encounter (HOSPITAL_COMMUNITY): Payer: Self-pay

## 2023-12-29 NOTE — Telephone Encounter (Signed)
 Please see the message below.

## 2024-01-05 ENCOUNTER — Ambulatory Visit (INDEPENDENT_AMBULATORY_CARE_PROVIDER_SITE_OTHER): Admitting: Family Medicine

## 2024-01-05 ENCOUNTER — Encounter: Payer: Self-pay | Admitting: Family Medicine

## 2024-01-05 VITALS — BP 143/94 | HR 86 | Ht 60.0 in | Wt 146.0 lb

## 2024-01-05 DIAGNOSIS — E871 Hypo-osmolality and hyponatremia: Secondary | ICD-10-CM | POA: Insufficient documentation

## 2024-01-05 DIAGNOSIS — I10 Essential (primary) hypertension: Secondary | ICD-10-CM | POA: Diagnosis not present

## 2024-01-05 DIAGNOSIS — G479 Sleep disorder, unspecified: Secondary | ICD-10-CM

## 2024-01-05 DIAGNOSIS — K219 Gastro-esophageal reflux disease without esophagitis: Secondary | ICD-10-CM

## 2024-01-05 DIAGNOSIS — F5102 Adjustment insomnia: Secondary | ICD-10-CM

## 2024-01-05 DIAGNOSIS — G8929 Other chronic pain: Secondary | ICD-10-CM | POA: Diagnosis not present

## 2024-01-05 DIAGNOSIS — M25512 Pain in left shoulder: Secondary | ICD-10-CM | POA: Diagnosis not present

## 2024-01-05 DIAGNOSIS — S46812D Strain of other muscles, fascia and tendons at shoulder and upper arm level, left arm, subsequent encounter: Secondary | ICD-10-CM | POA: Diagnosis not present

## 2024-01-05 DIAGNOSIS — R5383 Other fatigue: Secondary | ICD-10-CM

## 2024-01-05 MED ORDER — TRAMADOL HCL 50 MG PO TABS: 25.0000 mg | ORAL_TABLET | Freq: Three times a day (TID) | ORAL | 0 refills | Status: AC | PRN

## 2024-01-05 MED ORDER — OMEPRAZOLE 40 MG PO CPDR: 40.0000 mg | DELAYED_RELEASE_CAPSULE | Freq: Every evening | ORAL | 1 refills | Status: DC

## 2024-01-05 MED ORDER — TRAZODONE HCL 50 MG PO TABS: 50.0000 mg | ORAL_TABLET | Freq: Every day | ORAL | 1 refills | Status: DC

## 2024-01-05 NOTE — Progress Notes (Signed)
 Established patient visit   Patient: Melanie Carrillo   DOB: [birth year]   89 y.o. Female  MRN: [id number] Visit Date: 01/05/2024  Today's healthcare provider: Mimi Alt, MD   Chief Complaint  Patient presents with   Hypertension    Discuss medication L shoulder pain,   Subjective     HPI     Hypertension    Additional comments: Discuss medication L shoulder pain,      Last edited by Bart Lieu, CMA on 01/05/2024  3:14 PM.       Discussed the use of AI scribe software for clinical note transcription with the patient, who gave verbal consent to proceed.  History of Present Illness Melanie Carrillo is an 89 year old female with hypertension who presents with neck and shoulder pain.  She experiences persistent neck and shoulder pain, primarily on the left side, which initially started in her shoulder and then radiated to her neck. The pain is constant, and she states, 'I have not had a break from pain in so long.' She uses Zanaflex  4 mg, primarily at night, which helps her sleep but does not cause daytime drowsiness. Other treatments such as heating pads and stretching have not provided significant relief.  She has a history of severely elevated blood pressure, which has improved to 143/94 mmHg. She was previously hospitalized with blood pressures reaching as high as 259/116 mmHg and heart rates up to 123 bpm. She is currently taking losartan  100 mg and metoprolol  25 mg twice daily. She feels tired and shaky, and her daughter notes that her heart sometimes 'quivers.'  Her appetite is poor, and she eats because she has to, often relying on protein shakes when she does not feel like eating. She has a history of chronic low sodium levels. She has been feeling increasingly tired over time, and her daughter notes she has been drinking protein shakes to maintain nutrition.  She experiences sleep disturbances but finds trazodone  helpful for sleep. She takes a whole  50 mg tablet of trazodone  at night, along with Tylenol , to aid sleep.     Past Medical History:  Diagnosis Date   Allergy    Anxiety    Arthritis    Calculus of gallbladder and bile duct with obstruction without cholecystitis    Cataract    Choledocholithiasis    Colon cancer (HCC)    Constipation    DDD (degenerative disc disease), lumbar    Depression    Dyspnea    GERD (gastroesophageal reflux disease)    Glaucoma    Hip fracture requiring operative repair (HCC) 2023   HLD (hyperlipidemia)    Hypertension    Interstitial lung disease (HCC)    Left hip postoperative wound infection    Obstructive sleep apnea 01/29/2014   No Cpap   Osteomyelitis (HCC)    Osteoporosis    Rhabdomyolysis    Shingles     Medications: Outpatient Medications Prior to Visit  Medication Sig   brimonidine  (ALPHAGAN ) 0.2 % ophthalmic solution Place 1 drop into both eyes in the morning and at bedtime.   Cholecalciferol  (VITAMIN D -3) 125 MCG (5000 UT) TABS Take 5,000 Units by mouth in the morning.   feeding supplement (ENSURE ENLIVE / ENSURE PLUS) LIQD Take 237 mLs by mouth 2 (two) times daily between meals.   guaiFENesin  (MUCINEX ) 600 MG 12 hr tablet Take 600 mg by mouth at bedtime.   latanoprost  (XALATAN ) 0.005 % ophthalmic solution Place 1  drop into both eyes at bedtime.   losartan  (COZAAR ) 100 MG tablet Take 100 mg by mouth daily.   lovastatin (MEVACOR) 40 MG tablet TAKE 1 TABLET (40 MG TOTAL) BY MOUTH DAILY WITH DINNER FOR 90 DAYS   metoprolol  tartrate (LOPRESSOR ) 25 MG tablet Take 0.5 tablets (12.5 mg total) by mouth 2 (two) times daily.   montelukast  (SINGULAIR ) 10 MG tablet Take 1 tablet (10 mg total) by mouth at bedtime.   Multiple Vitamin (MULTIVITAMIN) tablet Take 1 tablet by mouth daily.   sertraline  (ZOLOFT ) 50 MG tablet Take 1 tablet (50 mg total) by mouth daily.   simethicone  (MYLICON) 80 MG chewable tablet Chew 80 mg by mouth as needed.   tiZANidine  (ZANAFLEX ) 4 MG tablet Take  0.5-1.5 tablets (2-6 mg total) by mouth every 8 (eight) hours as needed for muscle spasms (muscle tightness).   traZODone  (DESYREL ) 50 MG tablet TAKE 0.5-1 TABLETS (25-50 MG TOTAL) BY MOUTH AT BEDTIME AS NEEDED.   [DISCONTINUED] omeprazole  (PRILOSEC) 40 MG capsule Take 1 capsule (40 mg total) by mouth at bedtime.   No facility-administered medications prior to visit.   CT Cervical Spine Wo Contrast Result Date: 12/22/2023 CLINICAL DATA:  Neck pain, chronic; Headache, increasing frequency or severity EXAM: CT HEAD WITHOUT CONTRAST CT CERVICAL SPINE WITHOUT CONTRAST TECHNIQUE: Multidetector CT imaging of the head and cervical spine was performed following the standard protocol without intravenous contrast. Multiplanar CT image reconstructions of the cervical spine were also generated. RADIATION DOSE REDUCTION: This exam was performed according to the departmental dose-optimization program which includes automated exposure control, adjustment of the mA and/or kV according to patient size and/or use of iterative reconstruction technique. COMPARISON:  None Available. FINDINGS: CT HEAD FINDINGS Brain: Patchy and confluent areas of decreased attenuation are noted throughout the deep and periventricular white matter of the cerebral hemispheres bilaterally, compatible with chronic microvascular ischemic disease. No evidence of large-territorial acute infarction. No parenchymal hemorrhage. No mass lesion. No extra-axial collection. No mass effect or midline shift. No hydrocephalus. Basilar cisterns are patent. Vascular: No hyperdense vessel. Atherosclerotic calcifications are present within the cavernous internal carotid arteries. Skull: No acute fracture or focal lesion. Sinuses/Orbits: Paranasal sinuses and mastoid air cells are clear. The orbits are unremarkable. Other: None. CT CERVICAL SPINE FINDINGS Alignment: Normal. Skull base and vertebrae: Multilevel mild-to-moderate degenerative changes of the spine. No  associated severe osseous neural foraminal or central canal stenosis. No acute fracture. No aggressive appearing focal osseous lesion or focal pathologic process. Soft tissues and spinal canal: No prevertebral fluid or swelling. No visible canal hematoma. Upper chest: Unremarkable. Other: Atherosclerotic plaque of the aortic arch and its main branches. Heterogeneous lobular left thyroid  gland with underlying hypodense nodule measuring up to 1.4 cm. IMPRESSION: 1. No acute intracranial abnormality. 2. No acute displaced fracture or traumatic listhesis of the cervical spine. 3. Heterogeneous lobular left thyroid  gland with underlying hypodense nodule measuring up to 1.4 cm. No further follow-up indicated. Electronically Signed   By: Morgane  Naveau M.D.   On: 12/22/2023 19:05   CT Head Wo Contrast Result Date: 12/22/2023 CLINICAL DATA:  Neck pain, chronic; Headache, increasing frequency or severity EXAM: CT HEAD WITHOUT CONTRAST CT CERVICAL SPINE WITHOUT CONTRAST TECHNIQUE: Multidetector CT imaging of the head and cervical spine was performed following the standard protocol without intravenous contrast. Multiplanar CT image reconstructions of the cervical spine were also generated. RADIATION DOSE REDUCTION: This exam was performed according to the departmental dose-optimization program which includes automated exposure control, adjustment of  the mA and/or kV according to patient size and/or use of iterative reconstruction technique. COMPARISON:  None Available. FINDINGS: CT HEAD FINDINGS Brain: Patchy and confluent areas of decreased attenuation are noted throughout the deep and periventricular white matter of the cerebral hemispheres bilaterally, compatible with chronic microvascular ischemic disease. No evidence of large-territorial acute infarction. No parenchymal hemorrhage. No mass lesion. No extra-axial collection. No mass effect or midline shift. No hydrocephalus. Basilar cisterns are patent. Vascular: No  hyperdense vessel. Atherosclerotic calcifications are present within the cavernous internal carotid arteries. Skull: No acute fracture or focal lesion. Sinuses/Orbits: Paranasal sinuses and mastoid air cells are clear. The orbits are unremarkable. Other: None. CT CERVICAL SPINE FINDINGS Alignment: Normal. Skull base and vertebrae: Multilevel mild-to-moderate degenerative changes of the spine. No associated severe osseous neural foraminal or central canal stenosis. No acute fracture. No aggressive appearing focal osseous lesion or focal pathologic process. Soft tissues and spinal canal: No prevertebral fluid or swelling. No visible canal hematoma. Upper chest: Unremarkable. Other: Atherosclerotic plaque of the aortic arch and its main branches. Heterogeneous lobular left thyroid  gland with underlying hypodense nodule measuring up to 1.4 cm. IMPRESSION: 1. No acute intracranial abnormality. 2. No acute displaced fracture or traumatic listhesis of the cervical spine. 3. Heterogeneous lobular left thyroid  gland with underlying hypodense nodule measuring up to 1.4 cm. No further follow-up indicated. Electronically Signed   By: Morgane  Naveau M.D.   On: 12/22/2023 19:05   DG Chest 2 View Result Date: 12/22/2023 CLINICAL DATA:  Shortness of breath. EXAM: CHEST - 2 VIEW COMPARISON:  Chest radiograph dated 05/13/2023. FINDINGS: Stable cardiomediastinal silhouette. Aortic atherosclerosis. Chronic appearing bilateral interstitial changes, slightly more pronounced compared to the prior exam. No focal consolidation, pleural effusion, or pneumothorax. Exaggerated thoracic kyphosis with degenerative changes. No acute osseous abnormality. IMPRESSION: Chronic appearing bilateral interstitial changes, slightly more pronounced since the prior exam, could reflect progression of interstitial lung disease or superimposed infectious/inflammatory process. Electronically Signed   By: Mannie Seek M.D.   On: 12/22/2023 17:27      Review of Systems  Last CBC Lab Results  Component Value Date   WBC 10.0 12/22/2023   HGB 12.4 12/22/2023   HCT 37.8 12/22/2023   MCV 104.7 (H) 12/22/2023   MCH 34.3 (H) 12/22/2023   RDW 14.4 12/22/2023   PLT 268 12/22/2023   Last metabolic panel Lab Results  Component Value Date   GLUCOSE 112 (H) 12/22/2023   NA 130 (L) 12/22/2023   K 3.7 12/22/2023   CL 104 12/22/2023   CO2 15 (L) 12/22/2023   BUN 23 12/22/2023   CREATININE 0.59 12/22/2023   GFRNONAA >60 12/22/2023   CALCIUM 10.3 12/22/2023   PROT 7.1 05/13/2023   ALBUMIN  3.0 (L) 05/13/2023   LABGLOB 3.4 02/28/2023   AGRATIO 1.4 11/26/2022   BILITOT 1.4 (H) 05/13/2023   ALKPHOS 88 05/13/2023   AST 58 (H) 05/13/2023   ALT 34 05/13/2023   ANIONGAP 11 12/22/2023   Last lipids No results found for: "CHOL", "HDL", "LDLCALC", "LDLDIRECT", "TRIG", "CHOLHDL" Last hemoglobin A1c Lab Results  Component Value Date   HGBA1C 5.4 07/13/2022   Last thyroid  functions Lab Results  Component Value Date   TSH 2.360 07/13/2022   Last vitamin D  Lab Results  Component Value Date   VD25OH 54.74 04/29/2022   Last vitamin B12 and Folate Lab Results  Component Value Date   VITAMINB12 299 12/27/2021        Objective    BP (!) 143/94  Pulse 86   Ht 5' (1.524 m)   Wt 146 lb (66.2 kg)   LMP  (LMP Unknown)   SpO2 100%   BMI 28.51 kg/m   BP Readings from Last 3 Encounters:  01/05/24 (!) 143/94  12/23/23 (!) 168/98  12/22/23 (!) 157/111   Wt Readings from Last 3 Encounters:  01/05/24 146 lb (66.2 kg)  12/22/23 150 lb (68 kg)  12/22/23 150 lb (68 kg)        Physical Exam Cardiovascular:     Rate and Rhythm: Normal rate and regular rhythm.     Comments: PVS present during auscultation  Musculoskeletal:     Cervical back: No erythema. Pain with movement and muscular tenderness present.       No results found for any visits on 01/05/24.  Assessment & Plan     Problem List Items Addressed This  Visit       Cardiovascular and Mediastinum   Essential hypertension - Primary   Hypertension, Chronic  Hypertension with episodes of severe elevation, previously reaching 259/116 mmHg. Current blood pressure improved to 143/94 mmHg. Current management with losartan  and metoprolol  appears effective. Concerns about potential hypotension with additional medications. - Continue losartan  100 mg daily. - Continue metoprolol  25 mg twice daily. - Check complete metabolic panel to assess electrolytes and renal function.      Relevant Orders   CMP14+EGFR     Digestive   GERD (gastroesophageal reflux disease)   Chronic  Continue omeprazole  40mg  daily PRN  Refill sent to pharmacy        Relevant Medications   omeprazole  (PRILOSEC) 40 MG capsule     Other   Other fatigue   Chronic fatigue with recent worsening. Differential includes thyroid  dysfunction, medication side effects, and nutritional deficiencies. Previous thyroid  nodule noted with no follow-up indicated. Plan to assess thyroid  function and review medications for potential contributions to fatigue. - Check TSH, T4, and T3 levels to assess thyroid  function. - Review current medications for potential side effects contributing to fatigue. - Encourage nutritional intake, including protein shakes if appetite is low.      Relevant Orders   CMP14+EGFR   TSH+T4F+T3Free   Chronic hyponatremia   Chronic low sodium levels, previously managed with dietary adjustments. Current sodium levels consistent with past results. - Encourage dietary sodium intake as previously advised.      Adjustment insomnia   Chronic  Refilled trazodone  25-50mg  PRN at bedtime        Other Visit Diagnoses       Chronic left shoulder pain       Relevant Medications   traMADol (ULTRAM) 50 MG tablet   traZODone  (DESYREL ) 50 MG tablet     Strain of left trapezius muscle, subsequent encounter       Relevant Medications   traMADol (ULTRAM) 50 MG tablet      Difficulty sleeping       Relevant Medications   traZODone  (DESYREL ) 50 MG tablet        Assessment & Plan   Neck and shoulder pain Chronic neck and shoulder pain, likely involving the trapezius muscle, affecting daily activities. Current management with Zanaflex  provides some relief. Tramadol discussed as a potential addition to the regimen, with informed consent obtained regarding its use alongside Zanaflex . - Prescribe tramadol 25-50 mg every 8 hours as needed for pain. - Continue Zanaflex  4 mg every 6 hours while awake for muscle spasms. - Monitor pain levels and adjust medication as needed.  Hypoalbuminemia Low albumin  levels, possibly related to poor nutritional intake. She reports eating due to necessity rather than appetite. - Encourage increased protein intake through diet and supplements. - Monitor albumin  levels with follow-up lab tests.       Return in about 6 weeks (around 02/16/2024) for CHRONIC F/U.         Mimi Alt, MD  [facility] [phone] (phone) [phone] (fax)  [facility]

## 2024-01-05 NOTE — Assessment & Plan Note (Signed)
 Chronic low sodium levels, previously managed with dietary adjustments. Current sodium levels consistent with past results. - Encourage dietary sodium intake as previously advised.

## 2024-01-05 NOTE — Assessment & Plan Note (Signed)
 Chronic  Continue omeprazole  40mg  daily PRN  Refill sent to pharmacy

## 2024-01-05 NOTE — Assessment & Plan Note (Signed)
 Chronic fatigue with recent worsening. Differential includes thyroid  dysfunction, medication side effects, and nutritional deficiencies. Previous thyroid  nodule noted with no follow-up indicated. Plan to assess thyroid  function and review medications for potential contributions to fatigue. - Check TSH, T4, and T3 levels to assess thyroid  function. - Review current medications for potential side effects contributing to fatigue. - Encourage nutritional intake, including protein shakes if appetite is low.

## 2024-01-05 NOTE — Patient Instructions (Signed)
  VISIT SUMMARY: Today, we discussed your ongoing issues with neck and shoulder pain, hypertension, fatigue, and nutritional concerns. We reviewed your current medications and made some adjustments to better manage your symptoms.  YOUR PLAN: -HYPERTENSION: Hypertension means high blood pressure. Your blood pressure has improved with your current medications, losartan  and metoprolol . We will continue these medications and check your electrolytes and kidney function with a blood test.  -NECK AND SHOULDER PAIN: Your chronic neck and shoulder pain, likely involving the trapezius muscle, has been persistent. We will add tramadol 25-50 mg every 8 hours as needed for pain, in addition to continuing Zanaflex  4 mg every 6 hours while awake. Please monitor your pain levels and let us  know if adjustments are needed.  -FATIGUE: Fatigue means feeling very tired. We will check your thyroid  function with blood tests and review your medications to see if they might be causing your fatigue. Please continue to focus on good nutrition, including protein shakes if your appetite is low.  -HYPOALBUMINEMIA: Hypoalbuminemia means low levels of albumin , a protein in your blood, likely due to poor nutritional intake. We encourage you to increase your protein intake through diet and supplements, and we will monitor your albumin  levels with follow-up lab tests.  -HYPONATREMIA: Hyponatremia means low sodium levels in your blood. We recommend you continue to follow dietary advice to increase your sodium intake.  INSTRUCTIONS: Please follow up with the lab tests we discussed: a complete metabolic panel to check your electrolytes and kidney function, and thyroid  function tests (TSH, T4, and T3). Continue taking your medications as prescribed and monitor your symptoms. If you experience any new or worsening symptoms, please contact our office.                      Contains text generated by [employer].                                  Contains text generated by [employer].

## 2024-01-05 NOTE — Assessment & Plan Note (Signed)
 Hypertension, Chronic  Hypertension with episodes of severe elevation, previously reaching 259/116 mmHg. Current blood pressure improved to 143/94 mmHg. Current management with losartan  and metoprolol  appears effective. Concerns about potential hypotension with additional medications. - Continue losartan  100 mg daily. - Continue metoprolol  25 mg twice daily. - Check complete metabolic panel to assess electrolytes and renal function.

## 2024-01-05 NOTE — Assessment & Plan Note (Signed)
 Chronic  Refilled trazodone  25-50mg  PRN at bedtime

## 2024-01-06 ENCOUNTER — Ambulatory Visit: Payer: Self-pay | Admitting: Family Medicine

## 2024-01-06 LAB — CMP14+EGFR
ALT: 15 IU/L (ref 0–32)
AST: 28 IU/L (ref 0–40)
Albumin: 4.1 g/dL (ref 3.7–4.7)
Alkaline Phosphatase: 79 IU/L (ref 44–121)
BUN/Creatinine Ratio: 13 (ref 12–28)
BUN: 11 mg/dL (ref 8–27)
Bilirubin Total: 0.3 mg/dL (ref 0.0–1.2)
CO2: 20 mmol/L (ref 20–29)
Calcium: 10.3 mg/dL (ref 8.7–10.3)
Chloride: 94 mmol/L — ABNORMAL LOW (ref 96–106)
Creatinine, Ser: 0.82 mg/dL (ref 0.57–1.00)
Globulin, Total: 3.3 g/dL (ref 1.5–4.5)
Glucose: 82 mg/dL (ref 70–99)
Potassium: 4.3 mmol/L (ref 3.5–5.2)
Sodium: 131 mmol/L — ABNORMAL LOW (ref 134–144)
Total Protein: 7.4 g/dL (ref 6.0–8.5)
eGFR: 70 mL/min/{1.73_m2} (ref >59)

## 2024-01-06 LAB — TSH+T4F+T3FREE
Free T4: 1.11 ng/dL (ref 0.82–1.77)
T3, Free: 2.9 pg/mL (ref 2.0–4.4)
TSH: 2.3 u[IU]/mL (ref 0.450–4.500)

## 2024-01-12 ENCOUNTER — Other Ambulatory Visit: Payer: Self-pay | Admitting: Family Medicine

## 2024-01-12 MED ORDER — METOPROLOL TARTRATE 25 MG PO TABS
12.5000 mg | ORAL_TABLET | Freq: Two times a day (BID) | ORAL | 2 refills | Status: DC
Start: 2024-01-12 — End: 2024-02-20

## 2024-01-12 NOTE — Telephone Encounter (Signed)
 Should this be a 90 day supply? I have pended prescription.

## 2024-01-12 NOTE — Telephone Encounter (Signed)
 [facility] is requesting new RX metoprolol  tartrate (LOPRESSOR ) 25 MG tablet taking 1/2 twice a day Please advise

## 2024-01-17 ENCOUNTER — Telehealth: Payer: Self-pay | Admitting: Family Medicine

## 2024-01-17 NOTE — Telephone Encounter (Signed)
 Called and spoke to the pharmacy, the pt still has refills left on the requested medication Beth stated she would reach out to let the pt know when the Rx is ready

## 2024-01-17 NOTE — Telephone Encounter (Signed)
 [facility] is requesting refill metoprolol  tartrate (LOPRESSOR ) 25 MG tablet  To take 1/2 tablet twice a day Please advise

## 2024-01-24 ENCOUNTER — Other Ambulatory Visit: Payer: Self-pay | Admitting: Family Medicine

## 2024-01-24 DIAGNOSIS — G8929 Other chronic pain: Secondary | ICD-10-CM

## 2024-01-24 DIAGNOSIS — S46812D Strain of other muscles, fascia and tendons at shoulder and upper arm level, left arm, subsequent encounter: Secondary | ICD-10-CM

## 2024-01-25 DIAGNOSIS — M810 Age-related osteoporosis without current pathological fracture: Secondary | ICD-10-CM | POA: Diagnosis not present

## 2024-01-30 ENCOUNTER — Ambulatory Visit (INDEPENDENT_AMBULATORY_CARE_PROVIDER_SITE_OTHER): Admitting: Family Medicine

## 2024-01-30 ENCOUNTER — Encounter: Payer: Self-pay | Admitting: Family Medicine

## 2024-01-30 VITALS — BP 103/75 | HR 88 | Wt 147.3 lb

## 2024-01-30 DIAGNOSIS — H60503 Unspecified acute noninfective otitis externa, bilateral: Secondary | ICD-10-CM

## 2024-01-30 MED ORDER — NEOMYCIN-POLYMYXIN-HC 3.5-10000-1 OT SOLN: 3.0000 [drp] | Freq: Four times a day (QID) | OTIC | 0 refills | Status: AC

## 2024-01-30 NOTE — Progress Notes (Signed)
      Established patient visit   Patient: Melanie Carrillo   DOB: 04/23/1935   89 y.o. Female  MRN: [id number] Visit Date: 01/30/2024  Today's healthcare provider: Jeralene Mom, MD   Chief Complaint  Patient presents with   Cerumen Impaction   Subjective    HPI  Melanie Carrillo is an 89 year old female who presents with difficulty inserting hearing aids the last several day. She is accompanied by her daughter.  She experiences significant trouble with both ears, particularly when attempting to insert her hearing aids. She states that in the past she removed a large piece of hard wax from her ear, a recurring issue for her. Despite this, she continues to have difficulty with her hearing aids.  Medications: Outpatient Medications Prior to Visit  Medication Sig   brimonidine  (ALPHAGAN ) 0.2 % ophthalmic solution Place 1 drop into both eyes in the morning and at bedtime.   Cholecalciferol  (VITAMIN D -3) 125 MCG (5000 UT) TABS Take 5,000 Units by mouth in the morning.   feeding supplement (ENSURE ENLIVE / ENSURE PLUS) LIQD Take 237 mLs by mouth 2 (two) times daily between meals.   guaiFENesin  (MUCINEX ) 600 MG 12 hr tablet Take 600 mg by mouth at bedtime.   latanoprost  (XALATAN ) 0.005 % ophthalmic solution Place 1 drop into both eyes at bedtime.   losartan  (COZAAR ) 100 MG tablet Take 100 mg by mouth daily.   lovastatin (MEVACOR) 40 MG tablet TAKE 1 TABLET (40 MG TOTAL) BY MOUTH DAILY WITH DINNER FOR 90 DAYS   metoprolol  tartrate (LOPRESSOR ) 25 MG tablet Take 0.5 tablets (12.5 mg total) by mouth 2 (two) times daily.   montelukast  (SINGULAIR ) 10 MG tablet Take 1 tablet (10 mg total) by mouth at bedtime.   Multiple Vitamin (MULTIVITAMIN) tablet Take 1 tablet by mouth daily.   omeprazole  (PRILOSEC) 40 MG capsule Take 1 capsule (40 mg total) by mouth at bedtime.   sertraline  (ZOLOFT ) 50 MG tablet Take 1 tablet (50 mg total) by mouth daily.   simethicone  (MYLICON) 80 MG chewable tablet Chew 80 mg  by mouth as needed.   tiZANidine  (ZANAFLEX ) 4 MG tablet Take 0.5-1.5 tablets (2-6 mg total) by mouth every 8 (eight) hours as needed for muscle spasms (muscle tightness).   traZODone  (DESYREL ) 50 MG tablet Take 1 tablet (50 mg total) by mouth at bedtime.   traZODone  (DESYREL ) 50 MG tablet TAKE 0.5-1 TABLETS (25-50 MG TOTAL) BY MOUTH AT BEDTIME AS NEEDED.   No facility-administered medications prior to visit.         Objective    BP 103/75 (BP Location: Left Arm, Patient Position: Sitting, Cuff Size: Normal)   Pulse 88   Wt 147 lb 4.8 oz (66.8 kg)   LMP  (LMP Unknown)   SpO2 97%   BMI 28.77 kg/m    Physical Exam  Mild erythema and swelling of both ear canals, normal TM. No cerumen.   No results found for any visits on 01/30/24.  Assessment & Plan     1. Acute otitis externa of both ears, unspecified type (Primary)  - neomycin-polymyxin-hydrocortisone (CORTISPORIN) OTIC solution; Place 3 drops into both ears 4 (four) times daily for 7 days.  Dispense: 10 mL; Refill: 0           Jeralene Mom, MD  [facility] [phone] (phone) [phone] (fax)  [facility]

## 2024-02-07 ENCOUNTER — Other Ambulatory Visit: Payer: Self-pay | Admitting: Family Medicine

## 2024-02-07 ENCOUNTER — Telehealth: Payer: Self-pay | Admitting: Family Medicine

## 2024-02-07 DIAGNOSIS — M542 Cervicalgia: Secondary | ICD-10-CM

## 2024-02-07 NOTE — Telephone Encounter (Unsigned)
 Copied from CRM [id number]. Topic: Clinical - Medication Refill >> Feb 07, 2024  1:26 PM Stanly Early wrote: Medication: tiZANidine  (ZANAFLEX ) 4 MG tablet [[id number]]  Has the patient contacted their pharmacy? Yes (Agent: If no, request that the patient contact the pharmacy for the refill. If patient does not wish to contact the pharmacy document the reason why and proceed with request.) (Agent: If yes, when and what did the pharmacy advise?)  This is the patient's preferred pharmacy:  [id number] [place], [place] - [address] [place] [address] Phone: [phone] Fax: [phone]  Is this the correct pharmacy for this prescription? Yes If no, delete pharmacy and type the correct one.   Has the prescription been filled recently? No  Is the patient out of the medication? No 2 days left  Has the patient been seen for an appointment in the last year OR does the patient have an upcoming appointment? Yes  Can we respond through MyChart? No  Agent: Please be advised that Rx refills may take up to 3 business days. We ask that you follow-up with your pharmacy.

## 2024-02-09 NOTE — Telephone Encounter (Signed)
 Requested medication (s) are due for refill today:   Provider to review  Requested medication (s) are on the active medication list:   Yes  Future visit scheduled:   Yes 6/30   Last ordered: 02/08/2024 #90, 0 refills  Non delegated refill   Requested Prescriptions  Pending Prescriptions Disp Refills   tiZANidine  (ZANAFLEX ) 4 MG tablet 90 tablet 0    Sig: Take 0.5-1.5 tablets (2-6 mg total) by mouth every 8 (eight) hours as needed for muscle spasms (muscle tightness).     Not Delegated - Cardiovascular:  Alpha-2 Agonists - tizanidine  Failed - 02/09/2024  2:16 PM      Failed - This refill cannot be delegated      Failed - Valid encounter within last 6 months    Recent Outpatient Visits           1 week ago Acute otitis externa of both ears, unspecified type   [facility] Lamon Pillow, MD   1 month ago Essential hypertension   [facility] [place], [place], MD   1 month ago Neck pain   [facility] [place], [place], PA-C   3 months ago Neck pain without injury   [facility] Quinton Buckler, [place]

## 2024-02-17 ENCOUNTER — Other Ambulatory Visit: Payer: Self-pay | Admitting: Physician Assistant

## 2024-02-17 DIAGNOSIS — I10 Essential (primary) hypertension: Secondary | ICD-10-CM

## 2024-02-20 ENCOUNTER — Encounter: Payer: Self-pay | Admitting: Family Medicine

## 2024-02-20 ENCOUNTER — Ambulatory Visit (INDEPENDENT_AMBULATORY_CARE_PROVIDER_SITE_OTHER): Admitting: Family Medicine

## 2024-02-20 VITALS — BP 106/73 | HR 73 | Ht 60.0 in | Wt 145.0 lb

## 2024-02-20 DIAGNOSIS — E782 Mixed hyperlipidemia: Secondary | ICD-10-CM | POA: Diagnosis not present

## 2024-02-20 DIAGNOSIS — G479 Sleep disorder, unspecified: Secondary | ICD-10-CM

## 2024-02-20 DIAGNOSIS — H93232 Hyperacusis, left ear: Secondary | ICD-10-CM | POA: Diagnosis not present

## 2024-02-20 DIAGNOSIS — F5102 Adjustment insomnia: Secondary | ICD-10-CM | POA: Diagnosis not present

## 2024-02-20 DIAGNOSIS — H35371 Puckering of macula, right eye: Secondary | ICD-10-CM | POA: Diagnosis not present

## 2024-02-20 DIAGNOSIS — F3289 Other specified depressive episodes: Secondary | ICD-10-CM | POA: Diagnosis not present

## 2024-02-20 DIAGNOSIS — I10 Essential (primary) hypertension: Secondary | ICD-10-CM

## 2024-02-20 DIAGNOSIS — G4733 Obstructive sleep apnea (adult) (pediatric): Secondary | ICD-10-CM

## 2024-02-20 DIAGNOSIS — K219 Gastro-esophageal reflux disease without esophagitis: Secondary | ICD-10-CM

## 2024-02-20 DIAGNOSIS — E871 Hypo-osmolality and hyponatremia: Secondary | ICD-10-CM | POA: Diagnosis not present

## 2024-02-20 DIAGNOSIS — H209 Unspecified iridocyclitis: Secondary | ICD-10-CM | POA: Diagnosis not present

## 2024-02-20 DIAGNOSIS — H401111 Primary open-angle glaucoma, right eye, mild stage: Secondary | ICD-10-CM | POA: Diagnosis not present

## 2024-02-20 DIAGNOSIS — H401123 Primary open-angle glaucoma, left eye, severe stage: Secondary | ICD-10-CM | POA: Diagnosis not present

## 2024-02-20 MED ORDER — METOPROLOL TARTRATE 25 MG PO TABS: 12.5000 mg | ORAL_TABLET | Freq: Two times a day (BID) | ORAL | 2 refills | Status: AC

## 2024-02-20 MED ORDER — TRAZODONE HCL 50 MG PO TABS: 50.0000 mg | ORAL_TABLET | Freq: Every day | ORAL | 1 refills | Status: AC

## 2024-02-20 NOTE — Progress Notes (Signed)
 Established patient visit   Patient: Melanie Carrillo   DOB: Sep 30, 1934   89 y.o. Female  MRN: [id number] Visit Date: 02/20/2024  Today's healthcare provider: Rockie Agent, MD   Chief Complaint  Patient presents with   Hypertension   Subjective       Discussed the use of AI scribe software for clinical note transcription with the patient, who gave verbal consent to proceed.  History of Present Illness Melanie Carrillo is an 89 year old female with hypertension who presents for a follow-up visit. She is accompanied by her daughter.  She has a history of severe hypertension with previous readings greater than 200/100 mmHg. She continues to take losartan  100 mg and metoprolol  25 mg twice daily. She monitors her blood pressure two to three times a day and reports no headaches, chest discomfort, or other symptoms related to her blood pressure.  She has a history of glaucoma and reports recent vision issues, including seeing splotches and colors, and blurry vision. She was recently seen at an eye clinic and was restarted on prednisolone eye drops.  Her last metabolic panel showed a low sodium level of 131 mmol/L, which is consistent with her baseline as her sodium levels are chronically in the 130s.  She takes several medications including losartan  100 mg, metoprolol  25 mg twice daily, lovastatin 40 mg for cholesterol, sertraline  50 mg for mood, trazodone  for sleep, and tizanidine  4 mg twice daily for muscle relaxation. Trazodone  helps with her sleep issues.  She has a history of ear issues, including a recent ear infection treated with steroid drops for seven days. She reports difficulty inserting her hearing aid and a faint thumping in her ear. Her mood is stable with current medication.     Past Medical History:  Diagnosis Date   Allergy    Anxiety    Arthritis    Calculus of gallbladder and bile duct with obstruction without cholecystitis    Cataract     Choledocholithiasis    Colon cancer (HCC)    Constipation    DDD (degenerative disc disease), lumbar    Depression    Dyspnea    GERD (gastroesophageal reflux disease)    Glaucoma    Hip fracture (HCC) 12/25/2021   Hip fracture requiring operative repair (HCC) 2023   HLD (hyperlipidemia)    Hypertension    Interstitial lung disease (HCC)    Left hip postoperative wound infection    Obstructive sleep apnea 01/29/2014   No Cpap   Osteomyelitis (HCC)    Osteomyelitis (HCC) 06/24/2022   Osteoporosis    Rhabdomyolysis    Severe sepsis (HCC) 05/13/2023   Shingles     Medications: Outpatient Medications Prior to Visit  Medication Sig   brimonidine  (ALPHAGAN ) 0.2 % ophthalmic solution Place 1 drop into both eyes in the morning and at bedtime.   Cholecalciferol  (VITAMIN D -3) 125 MCG (5000 UT) TABS Take 5,000 Units by mouth in the morning.   feeding supplement (ENSURE ENLIVE / ENSURE PLUS) LIQD Take 237 mLs by mouth 2 (two) times daily between meals.   guaiFENesin  (MUCINEX ) 600 MG 12 hr tablet Take 600 mg by mouth at bedtime.   latanoprost  (XALATAN ) 0.005 % ophthalmic solution Place 1 drop into both eyes at bedtime.   losartan  (COZAAR ) 100 MG tablet Take 100 mg by mouth daily.   lovastatin (MEVACOR) 40 MG tablet TAKE 1 TABLET (40 MG TOTAL) BY MOUTH DAILY WITH DINNER FOR 90 DAYS   montelukast  (  SINGULAIR ) 10 MG tablet Take 1 tablet (10 mg total) by mouth at bedtime.   Multiple Vitamin (MULTIVITAMIN) tablet Take 1 tablet by mouth daily.   omeprazole  (PRILOSEC) 40 MG capsule Take 1 capsule (40 mg total) by mouth at bedtime.   sertraline  (ZOLOFT ) 50 MG tablet Take 1 tablet (50 mg total) by mouth daily.   simethicone  (MYLICON) 80 MG chewable tablet Chew 80 mg by mouth as needed.   tiZANidine  (ZANAFLEX ) 4 MG tablet TAKE 0.5-1.5 TABLETS (2-6 MG TOTAL) BY MOUTH EVERY 8 (EIGHT) HOURS AS NEEDED FOR MUSCLE SPASMS (MUSCLE TIGHTNESS).   [DISCONTINUED] metoprolol  tartrate (LOPRESSOR ) 25 MG tablet Take  0.5 tablets (12.5 mg total) by mouth 2 (two) times daily.   [DISCONTINUED] traZODone  (DESYREL ) 50 MG tablet TAKE 0.5-1 TABLETS (25-50 MG TOTAL) BY MOUTH AT BEDTIME AS NEEDED.   [DISCONTINUED] traZODone  (DESYREL ) 50 MG tablet Take 1 tablet (50 mg total) by mouth at bedtime. (Patient not taking: Reported on 02/20/2024)   No facility-administered medications prior to visit.    Review of Systems  Last metabolic panel Lab Results  Component Value Date   GLUCOSE 82 01/05/2024   NA 131 (L) 01/05/2024   K 4.3 01/05/2024   CL 94 (L) 01/05/2024   CO2 20 01/05/2024   BUN 11 01/05/2024   CREATININE 0.82 01/05/2024   EGFR 70 01/05/2024   CALCIUM 10.3 01/05/2024   PROT 7.4 01/05/2024   ALBUMIN  4.1 01/05/2024   LABGLOB 3.3 01/05/2024   AGRATIO 1.4 11/26/2022   BILITOT 0.3 01/05/2024   ALKPHOS 79 01/05/2024   AST 28 01/05/2024   ALT 15 01/05/2024   ANIONGAP 11 12/22/2023        Objective    BP 106/73   Pulse 73   Ht 5' (1.524 m)   Wt 145 lb (65.8 kg)   LMP  (LMP Unknown)   SpO2 96%   BMI 28.32 kg/m  BP Readings from Last 3 Encounters:  02/20/24 106/73  01/30/24 103/75  01/05/24 (!) 143/94   Wt Readings from Last 3 Encounters:  02/20/24 145 lb (65.8 kg)  01/30/24 147 lb 4.8 oz (66.8 kg)  01/05/24 146 lb (66.2 kg)        Physical Exam  General: Alert, no acute distress Cardio: Normal S1 and S2, RRR, no r/m/g HEENT:  Physical Exam VITALS: BP- 106/73 HEENT: Left ear with dry skin on eardrum, eardrum retracted, possible chronic fluid.   Pulm: CTAB, normal work of breathing ABD: soft, abdomen is not distended, there is no tenderness to palpation, normal BS  Extremities: no LE edema    No results found for any visits on 02/20/24.  Assessment & Plan     Problem List Items Addressed This Visit       Cardiovascular and Mediastinum   Essential hypertension - Primary   Hypertension Chronic  Hypertension is well-controlled with losartan  100 mg and metoprolol  25 mg  twice daily. Current blood pressure is 106/73 mmHg. No symptoms of headaches or chest discomfort reported. - Continue losartan  100 mg daily - Continue metoprolol  25 mg twice daily - Monitor blood pressure regularly - Educate on symptoms of hypertensive crisis, including explosive headache, vision changes, chest pain, and dizziness      Relevant Medications   metoprolol  tartrate (LOPRESSOR ) 25 MG tablet     Respiratory   Obstructive sleep apnea     Digestive   GERD (gastroesophageal reflux disease)     Other   Hyperlipidemia, mixed   Chronic  Continue lovastatin  40mg  daily       Relevant Medications   metoprolol  tartrate (LOPRESSOR ) 25 MG tablet   Depression    Depression Chronic Depression managed with sertraline  50 mg, effective in preventing crying and improving mood. - Continue sertraline  50 mg daily      Relevant Medications   traZODone  (DESYREL ) 50 MG tablet   Chronic hyponatremia   Chronic Hyponatremia Chronic hyponatremia with recent sodium level at 131 mmol/L, consistent with baseline.      Adjustment insomnia   Chronic  Improved sleep with trazodone , currently taking one tablet at night. - Continue trazodone  for sleep      Other Visit Diagnoses       Difficulty sleeping       Relevant Medications   traZODone  (DESYREL ) 50 MG tablet     Hyperacusis of left ear       Relevant Orders   Ambulatory referral to ENT        Assessment & Plan     Glaucoma Reports blurry vision and splotches. Previously elevated intraocular pressure. Currently on prednisolone eye drops to reduce inflammation. - Continue prednisolone eye drops as prescribed by ophthalmologist  Hearing Loss and Ear Issues Reports difficulty with hearing aid placement and faint thumping in the ear. Examination shows dry skin and retracted eardrum, suggesting possible chronic fluid. Previous ear infection treated with steroid drops. - Refer to ENT for further evaluation and  management   Muscle Spasms of Neck  Relief from muscle spasms with tizanidine  4 mg taken twice daily. - Continue tizanidine  4 mg twice daily  General Health Maintenance Physical exam previously postponed due to neck issues. Plans to reschedule discussed. - Schedule a physical exam in the next month  Follow-up Follow-up plans for various conditions and medication refills discussed. Coordination with pharmacy for medication availability addressed. - Ensure medication refills are sent to pharmacy - Follow up with ENT after referral - Schedule and attend physical exam     Return in about 1 month (around 03/21/2024) for AWV.         Rockie Agent, MD  [facility] [phone] (phone) [phone] (fax)  [facility]

## 2024-02-20 NOTE — Assessment & Plan Note (Signed)
 Chronic Continue lovastatin 40 mg daily

## 2024-02-20 NOTE — Assessment & Plan Note (Signed)
  Depression Chronic Depression managed with sertraline  50 mg, effective in preventing crying and improving mood. - Continue sertraline  50 mg daily

## 2024-02-20 NOTE — Assessment & Plan Note (Signed)
 Chronic  Improved sleep with trazodone , currently taking one tablet at night. - Continue trazodone  for sleep

## 2024-02-20 NOTE — Assessment & Plan Note (Signed)
 Hypertension Chronic  Hypertension is well-controlled with losartan  100 mg and metoprolol  25 mg twice daily. Current blood pressure is 106/73 mmHg. No symptoms of headaches or chest discomfort reported. - Continue losartan  100 mg daily - Continue metoprolol  25 mg twice daily - Monitor blood pressure regularly - Educate on symptoms of hypertensive crisis, including explosive headache, vision changes, chest pain, and dizziness

## 2024-02-20 NOTE — Assessment & Plan Note (Signed)
 Chronic Hyponatremia Chronic hyponatremia with recent sodium level at 131 mmol/L, consistent with baseline.

## 2024-03-01 DIAGNOSIS — H401111 Primary open-angle glaucoma, right eye, mild stage: Secondary | ICD-10-CM | POA: Diagnosis not present

## 2024-03-01 DIAGNOSIS — H209 Unspecified iridocyclitis: Secondary | ICD-10-CM | POA: Diagnosis not present

## 2024-03-01 DIAGNOSIS — H40053 Ocular hypertension, bilateral: Secondary | ICD-10-CM | POA: Diagnosis not present

## 2024-03-01 DIAGNOSIS — H401123 Primary open-angle glaucoma, left eye, severe stage: Secondary | ICD-10-CM | POA: Diagnosis not present

## 2024-03-20 DIAGNOSIS — H209 Unspecified iridocyclitis: Secondary | ICD-10-CM | POA: Diagnosis not present

## 2024-03-20 DIAGNOSIS — H6123 Impacted cerumen, bilateral: Secondary | ICD-10-CM | POA: Diagnosis not present

## 2024-03-20 DIAGNOSIS — H401111 Primary open-angle glaucoma, right eye, mild stage: Secondary | ICD-10-CM | POA: Diagnosis not present

## 2024-03-20 DIAGNOSIS — H9203 Otalgia, bilateral: Secondary | ICD-10-CM | POA: Diagnosis not present

## 2024-03-20 DIAGNOSIS — H903 Sensorineural hearing loss, bilateral: Secondary | ICD-10-CM | POA: Diagnosis not present

## 2024-03-20 DIAGNOSIS — H35371 Puckering of macula, right eye: Secondary | ICD-10-CM | POA: Diagnosis not present

## 2024-03-20 DIAGNOSIS — H900 Conductive hearing loss, bilateral: Secondary | ICD-10-CM | POA: Diagnosis not present

## 2024-03-20 DIAGNOSIS — H401123 Primary open-angle glaucoma, left eye, severe stage: Secondary | ICD-10-CM | POA: Diagnosis not present

## 2024-03-26 ENCOUNTER — Encounter: Payer: Self-pay | Admitting: Family Medicine

## 2024-03-26 ENCOUNTER — Ambulatory Visit: Admitting: Family Medicine

## 2024-03-26 VITALS — BP 112/79 | HR 83 | Temp 98.2°F | Ht 64.0 in | Wt 147.6 lb

## 2024-03-26 DIAGNOSIS — I10 Essential (primary) hypertension: Secondary | ICD-10-CM | POA: Diagnosis not present

## 2024-03-26 DIAGNOSIS — Z780 Asymptomatic menopausal state: Secondary | ICD-10-CM | POA: Diagnosis not present

## 2024-03-26 DIAGNOSIS — M542 Cervicalgia: Secondary | ICD-10-CM | POA: Diagnosis not present

## 2024-03-26 DIAGNOSIS — Z131 Encounter for screening for diabetes mellitus: Secondary | ICD-10-CM

## 2024-03-26 DIAGNOSIS — Z0001 Encounter for general adult medical examination with abnormal findings: Secondary | ICD-10-CM

## 2024-03-26 DIAGNOSIS — E871 Hypo-osmolality and hyponatremia: Secondary | ICD-10-CM

## 2024-03-26 DIAGNOSIS — G4733 Obstructive sleep apnea (adult) (pediatric): Secondary | ICD-10-CM | POA: Diagnosis not present

## 2024-03-26 DIAGNOSIS — E782 Mixed hyperlipidemia: Secondary | ICD-10-CM | POA: Diagnosis not present

## 2024-03-26 DIAGNOSIS — Z130 Encounter for screening for diseases of the blood and blood-forming organs and certain disorders involving the immune mechanism: Secondary | ICD-10-CM | POA: Diagnosis not present

## 2024-03-26 DIAGNOSIS — Z0000 Encounter for general adult medical examination without abnormal findings: Secondary | ICD-10-CM

## 2024-03-26 DIAGNOSIS — F5102 Adjustment insomnia: Secondary | ICD-10-CM | POA: Diagnosis not present

## 2024-03-26 MED ORDER — TIZANIDINE HCL 4 MG PO TABS: 2.0000 mg | ORAL_TABLET | Freq: Three times a day (TID) | ORAL | 2 refills | Status: DC | PRN

## 2024-03-26 NOTE — Patient Instructions (Signed)
 It was a pleasure to see you today!  Thank you for choosing [facility] for your primary care.   Today you were seen for your annual physical  Please review the attached information regarding helpful preventive health topics.   To keep you healthy, please keep in mind the following health maintenance items that you are due for:   Health Maintenance Due  Topic Date Due   DTaP/Tdap/Td (1 - Tdap) Never done   Zoster Vaccines- Shingrix (1 of 2) Never done   DEXA SCAN  Never done   COVID-19 Vaccine (8 - 2024-25 season) 04/24/2023   INFLUENZA VACCINE  03/23/2024   Please discuss the COVID and Shingrix vaccines with your pharmacist   We will likely have the influenza vaccine closer to Oct/end of Sept this year   Please contact [place] to schedule the bone density scan:   [facility] [address] [place],  [place]  [address] Main: [phone]   Best Wishes,   Dr. Lang

## 2024-03-26 NOTE — Progress Notes (Unsigned)
 Subjective:   Melanie Carrillo is a 89 y.o. female who presents for Medicare Annual (Subsequent) preventive examination.  Visit Complete: In person  Patient Medicare AWV questionnaire was completed by the patient on 03/28/24 ; I have confirmed that all information answered by patient is correct and no changes since this date.  Discussed the use of AI scribe software for clinical note transcription with the patient, who gave verbal consent to proceed.  History of Present Illness Melanie Carrillo is an 89 year old female who presents for an annual wellness visit.  She has a history of glaucoma, managed by [facility], with previous significant increases in intraocular pressure requiring fluid drainage and the use of three different eye drops. She was last seen by her eye care provider last week and is monitored frequently.  She has a history of shingles and has received treatment for it. She has previously received a shingles vaccine and is open to receiving a flu shot.  She experiences chronic neck pain, for which she takes tizanidine , 4 mg, at bedtime to relax her shoulders and alleviate headaches. She also uses Tylenol  during the day for additional relief.  She has a lot of mucus, which she manages with medication as it previously affected her ability to speak. She denies recent colds, shortness of breath, or wheezing, although she mentions having 'all that stuff, flea.'           Objective:    Today's Vitals   03/26/24 1625  BP: 112/79  Pulse: 83  Temp: 98.2 F (36.8 C)  TempSrc: Oral  SpO2: 98%  Weight: 147 lb 9.6 oz (67 kg)  Height: 5' 4 (1.626 m)   Body mass index is 25.34 kg/m.     03/26/2024    4:27 PM 12/22/2023    5:42 PM 05/12/2023    6:17 AM 05/05/2023   12:37 PM 04/28/2023   10:10 AM 04/28/2023    9:46 AM 07/28/2022    8:54 AM  Advanced Directives  Does Patient Have a Medical Advance Directive? Yes No Yes Yes Yes Yes Yes  Type of Advance Directive  Healthcare Power of Attorney  Living will Living will Healthcare Power of [place];Living will Healthcare Power of [place];Living will Living will  Does patient want to make changes to medical advance directive?       No - Patient declined  Copy of Healthcare Power of Attorney in Chart? No - copy requested        Would patient like information on creating a medical advance directive?  No - Patient declined     No - Patient declined    Current Medications (verified) Outpatient Encounter Medications as of 03/26/2024  Medication Sig   brimonidine  (ALPHAGAN ) 0.2 % ophthalmic solution Place 1 drop into both eyes in the morning and at bedtime.   Cholecalciferol  (VITAMIN D -3) 125 MCG (5000 UT) TABS Take 5,000 Units by mouth in the morning.   feeding supplement (ENSURE ENLIVE / ENSURE PLUS) LIQD Take 237 mLs by mouth 2 (two) times daily between meals.   guaiFENesin  (MUCINEX ) 600 MG 12 hr tablet Take 600 mg by mouth at bedtime.   latanoprost  (XALATAN ) 0.005 % ophthalmic solution Place 1 drop into both eyes at bedtime.   losartan  (COZAAR ) 100 MG tablet Take 100 mg by mouth daily.   lovastatin (MEVACOR) 40 MG tablet TAKE 1 TABLET (40 MG TOTAL) BY MOUTH DAILY WITH DINNER FOR 90 DAYS   metoprolol  tartrate (LOPRESSOR ) 25 MG tablet  Take 0.5 tablets (12.5 mg total) by mouth 2 (two) times daily.   montelukast  (SINGULAIR ) 10 MG tablet Take 1 tablet (10 mg total) by mouth at bedtime.   Multiple Vitamin (MULTIVITAMIN) tablet Take 1 tablet by mouth daily.   omeprazole  (PRILOSEC) 40 MG capsule Take 1 capsule (40 mg total) by mouth at bedtime.   sertraline  (ZOLOFT ) 50 MG tablet Take 1 tablet (50 mg total) by mouth daily.   simethicone  (MYLICON) 80 MG chewable tablet Chew 80 mg by mouth as needed.   traZODone  (DESYREL ) 50 MG tablet Take 1 tablet (50 mg total) by mouth at bedtime.   [DISCONTINUED] tiZANidine  (ZANAFLEX ) 4 MG tablet TAKE 0.5-1.5 TABLETS (2-6 MG TOTAL) BY MOUTH EVERY 8 (EIGHT) HOURS AS NEEDED FOR MUSCLE  SPASMS (MUSCLE TIGHTNESS).   tiZANidine  (ZANAFLEX ) 4 MG tablet Take 0.5-1.5 tablets (2-6 mg total) by mouth every 8 (eight) hours as needed for muscle spasms (muscle tightness).   No facility-administered encounter medications on file as of 03/26/2024.    Allergies (verified) Patient has no known allergies.   History: Past Medical History:  Diagnosis Date   Allergy    Anxiety    Arthritis    Calculus of gallbladder and bile duct with obstruction without cholecystitis    Cataract    Choledocholithiasis    Colon cancer (HCC)    Constipation    DDD (degenerative disc disease), lumbar    Depression    Dyspnea    GERD (gastroesophageal reflux disease)    Glaucoma    Hip fracture (HCC) 12/25/2021   Hip fracture requiring operative repair (HCC) 2023   HLD (hyperlipidemia)    Hypertension    Interstitial lung disease (HCC)    Left hip postoperative wound infection    Obstructive sleep apnea 01/29/2014   No Cpap   Osteomyelitis (HCC)    Osteomyelitis (HCC) 06/24/2022   Osteoporosis    Rhabdomyolysis    Severe sepsis (HCC) 05/13/2023   Shingles    Past Surgical History:  Procedure Laterality Date   APPENDECTOMY     CARDIAC CATHETERIZATION     CHOLECYSTECTOMY     COLON SURGERY     COLONOSCOPY     ENDOSCOPIC RETROGRADE CHOLANGIOPANCREATOGRAPHY (ERCP) WITH PROPOFOL  N/A 04/28/2023   Procedure: ENDOSCOPIC RETROGRADE CHOLANGIOPANCREATOGRAPHY (ERCP) WITH PROPOFOL ;  Surgeon: Jinny Carmine, MD;  Location: [facility] ENDOSCOPY;  Service: Endoscopy;  Laterality: N/A;   EYE SURGERY Bilateral    cataracts   FEMUR IM NAIL Left 04/28/2022   Procedure: REVISION FIXATION OF LEFT FEMUR FRACTURE;  Surgeon: Kendal Franky SQUIBB, MD;  Location: [facility] OR;  Service: Orthopedics;  Laterality: Left;   FRACTURE SURGERY     HARDWARE REMOVAL Left 04/28/2022   Procedure: HARDWARE REMOVAL;  Surgeon: Kendal Franky SQUIBB, MD;  Location: [facility] OR;  Service: Orthopedics;  Laterality: Left;   HERNIA REPAIR     I & D EXTREMITY  Left 06/23/2022   Procedure: IRRIGATION AND DEBRIDEMENT LEFT HIP;  Surgeon: Kendal Franky SQUIBB, MD;  Location: [facility] OR;  Service: Orthopedics;  Laterality: Left;   I & D EXTREMITY Left 07/28/2022   Procedure: IRRIGATION AND DEBRIDEMENT HIP;  Surgeon: Kendal Franky SQUIBB, MD;  Location: [facility] OR;  Service: Orthopedics;  Laterality: Left;   INTRAMEDULLARY (IM) NAIL INTERTROCHANTERIC Left 12/26/2021   Procedure: INTRAMEDULLARY (IM) NAIL INTERTROCHANTRIC;  Surgeon: Rollene Cough, MD;  Location: [facility] ORS;  Service: Orthopedics;  Laterality: Left;   MULTIPLE TOOTH EXTRACTIONS     full dentures   REMOVAL OF STONES  04/28/2023  Procedure: REMOVAL OF STONES;  Surgeon: Jinny Carmine, MD;  Location: [facility] ENDOSCOPY;  Service: Endoscopy;;   shoulder  surgery Left    SPHINCTEROTOMY  04/28/2023   Procedure: SPHINCTEROTOMY;  Surgeon: Jinny Carmine, MD;  Location: [facility] ENDOSCOPY;  Service: Endoscopy;;   TONSILLECTOMY     TUBAL LIGATION     UPPER GI ENDOSCOPY     WRIST SURGERY Right    Family History  Problem Relation Age of Onset   Hypertension Mother    Heart disease Mother    Heart disease Father    Social History   Socioeconomic History   Marital status: Widowed    Spouse name: Not on file   Number of children: Not on file   Years of education: Not on file   Highest education level: 12th grade  Occupational History   Not on file  Tobacco Use   Smoking status: Never    Passive exposure: Never   Smokeless tobacco: Never  Vaping Use   Vaping status: Never Used  Substance and Sexual Activity   Alcohol use: Not Currently   Drug use: Never   Sexual activity: Not Currently    Birth control/protection: Post-menopausal  Other Topics Concern   Not on file  Social History Narrative   Not on file   Social Drivers of Health   Financial Resource Strain: Low Risk  (03/22/2024)   Overall Financial Resource Strain (CARDIA)    Difficulty of Paying Living Expenses: Not hard at all  Food Insecurity: No  Food Insecurity (03/22/2024)   Hunger Vital Sign    Worried About Running Out of Food in the Last Year: Never true    Ran Out of Food in the Last Year: Never true  Transportation Needs: No Transportation Needs (03/22/2024)   PRAPARE - Administrator, Civil Service (Medical): No    Lack of Transportation (Non-Medical): No  Physical Activity: Inactive (03/22/2024)   Exercise Vital Sign    Days of Exercise per Week: 0 days    Minutes of Exercise per Session: Not on file  Stress: No Stress Concern Present (03/22/2024)   [employer] of Occupational Health - Occupational Stress Questionnaire    Feeling of Stress: Not at all  Social Connections: Unknown (03/22/2024)   Social Connection and Isolation Panel    Frequency of Communication with Friends and Family: Patient declined    Frequency of Social Gatherings with Friends and Family: Patient declined    Attends Religious Services: Never    Database administrator or Organizations: No    Attends Engineer, structural: Not on file    Marital Status: Widowed    Tobacco Counseling Counseling given: Not Answered   Clinical Intake:                        Activities of Daily Living    03/26/2024    4:18 PM 03/22/2024   12:54 PM  In your present state of health, do you have any difficulty performing the following activities:  Hearing? 1 1  Comment Has hearing aids.   Vision? 1   Comment Patient has glaucoma and wears readers.   Difficulty concentrating or making decisions? 0 0  Walking or climbing stairs? 0 1  Comment States she don't climb stairs.   Dressing or bathing? 0 0  Doing errands, shopping? 1 1  Comment Usually has her daughter with her.   Preparing Food and eating ? N   Comment  Daughter usually cooks   Using the Toilet? N N  In the past six months, have you accidently leaked urine? N N  Comment Just sometimes if she sneezing or coughs.   Do you have problems with loss of bowel control? N  N  Managing your Medications? N N  Managing your Finances? N N  Housekeeping or managing your Housekeeping? N N    Patient Care Team: Sharma Coyer, MD as PCP - General (Family Medicine)  Indicate any recent Medical Services you may have received from other than [facility] providers in the past year (date may be approximate).  Physical Exam VITALS: BP- 112/79 CHEST: Lungs with transient wheezing. CARDIOVASCULAR: Heart sounds normal. NEUROLOGICAL: Pupils reactive to light. 5/5 strength in bilateral upper and lower extremities, no edema in lower extremities       Assessment:   This is a routine wellness examination for Melanie Carrillo.  Hearing/Vision screen No results found.   Goals Addressed   None    Depression Screen    03/26/2024    4:24 PM 12/22/2023    3:56 PM 07/25/2023    1:23 PM 03/22/2023   10:15 AM 11/26/2022    3:53 PM 11/11/2022    4:11 PM 08/10/2022    3:40 PM  PHQ 2/9 Scores  PHQ - 2 Score 0 1 0 0 0 0 0  PHQ- 9 Score  6   4 0     Fall Risk    03/22/2024   12:54 PM 12/22/2023    3:55 PM 03/22/2023   10:15 AM 11/26/2022    3:53 PM 11/11/2022    4:10 PM  Fall Risk   Falls in the past year? 1 1 0 1 1  Number falls in past yr: 0 0 0 0 1  Injury with Fall? 0 0 0 1 1  Risk for fall due to :   Impaired balance/gait History of fall(s) History of fall(s)  Follow up   Falls evaluation completed Falls evaluation completed;Education provided;Falls prevention discussed Falls evaluation completed    MEDICARE RISK AT HOME: Medicare Risk at Home Any stairs in or around the home?: No If so, are there any without handrails?: No Home free of loose throw rugs in walkways, pet beds, electrical cords, etc?: Yes Adequate lighting in your home to reduce risk of falls?: Yes Life alert?: No Use of a cane, walker or w/c?: Yes Grab bars in the bathroom?: No Shower chair or bench in shower?: Yes Elevated toilet seat or a handicapped toilet?: Yes  TIMED UP AND GO:  Was the test  performed?  No    Cognitive Function:        03/26/2024    4:22 PM  6CIT Screen  What Year? 0 points  What month? 0 points  What time? 0 points  Count back from 20 0 points  Months in reverse 2 points  Repeat phrase 4 points  Total Score 6 points    Immunizations Immunization History  Administered Date(s) Administered   Fluad Quad(high Dose 65+) 05/27/2022   Influenza Split 04/18/2015   Influenza, High Dose Seasonal PF 07/17/2014, 05/12/2016, 05/25/2017, 04/20/2018, 06/09/2019, 05/09/2021, 05/27/2022   Influenza, Seasonal, Injecte, Preservative Fre 07/18/2006, 06/03/2008, 05/20/2010, 06/01/2013, 04/25/2020   Influenza-Unspecified 05/25/2012   Moderna Covid-19 Fall Seasonal Vaccine 29yrs & older 06/17/2022   Moderna Covid-19 Vaccine  Bivalent Booster 58yrs & up 11/06/2019   Moderna Sars-Covid-2 Vaccination 10/05/2019, 10/09/2019, 11/02/2019, 04/25/2020, 03/11/2021   PNEUMOCOCCAL CONJUGATE-20 03/11/2021   Pneumococcal Conjugate-13 05/12/2016  Pneumococcal Polysaccharide-23 05/25/2017    TDAP status: Due, Education has been provided regarding the importance of this vaccine. Advised may receive this vaccine at local pharmacy or Health Dept. Aware to provide a copy of the vaccination record if obtained from local pharmacy or Health Dept. Verbalized acceptance and understanding.  Flu Vaccine status: Due, Education has been provided regarding the importance of this vaccine. Advised may receive this vaccine at local pharmacy or Health Dept. Aware to provide a copy of the vaccination record if obtained from local pharmacy or Health Dept. Verbalized acceptance and understanding.  Pneumococcal vaccine status: Up to date  Covid-19 vaccine status: Information provided on how to obtain vaccines.   Qualifies for Shingles Vaccine? Yes   Zostavax completed Yes   Shingrix Completed?: No  Screening Tests Health Maintenance  Topic Date Due   DTaP/Tdap/Td (1 - Tdap) Never done   Zoster  Vaccines- Shingrix (1 of 2) Never done   DEXA SCAN  Never done   COVID-19 Vaccine (8 - 2024-25 season) 04/24/2023   INFLUENZA VACCINE  03/23/2024   Medicare Annual Wellness (AWV)  03/26/2025   Pneumococcal Vaccine: 50+ Years  Completed   Hepatitis B Vaccines  Aged Out   HPV VACCINES  Aged Out   Meningococcal B Vaccine  Aged Out    Health Maintenance  Health Maintenance Due  Topic Date Due   DTaP/Tdap/Td (1 - Tdap) Never done   Zoster Vaccines- Shingrix (1 of 2) Never done   DEXA SCAN  Never done   COVID-19 Vaccine (8 - 2024-25 season) 04/24/2023   INFLUENZA VACCINE  03/23/2024    Colorectal cancer screening: Type of screening: Colonoscopy. Completed no date to review but procedure listed in history of surgical procedures.   Mammogram status: No longer required due to age >80.  Bone Density status: Ordered today. Pt provided with contact info and advised to call to schedule appt.  Lung Cancer Screening: (Low Dose CT Chest recommended if Age 4-80 years, 20 pack-year currently smoking OR have quit w/in 15years.) does not qualify.   Lung Cancer Screening Referral: N/A  Additional Screening:  Hepatitis C Screening: does qualify; no results for this on file   Vision Screening: Recommended annual ophthalmology exams for early detection of glaucoma and other disorders of the eye. Is the patient up to date with their annual eye exam?  Yes    Dental Screening: Recommended annual dental exams for proper oral hygiene  Diabetic Foot Exam: N/A  Community Resource Referral / Chronic Care Management: CRR required this visit?  No   CCM required this visit?  No       Plan:     I have personally reviewed and noted the following in the patient's chart:   Medical and social history Use of alcohol, tobacco or illicit drugs  Current medications and supplements including opioid prescriptions. Patient is not currently taking opioid prescriptions. Functional ability and  status Nutritional status Physical activity Advanced directives List of other physicians Hospitalizations, surgeries, and ER visits in previous 12 months Vitals Screenings to include cognitive, depression, and falls Referrals and appointments  In addition, I have reviewed and discussed with patient certain preventive protocols, quality metrics, and best practice recommendations. A written personalized care plan for preventive services as well as general preventive health recommendations were provided to patient.    Assessment & Plan Adult Wellness Visit Annual wellness visit conducted. Blood pressure is 112/79 mmHg. - Order CBC, A1c, CMP, and lipid panel - Order bone density  scan - Recommend tetanus vaccine at pharmacy - Recommend flu shot near end of September - Discuss COVID vaccine with pharmacist  Glaucoma Glaucoma is actively managed by an ophthalmologist. Recent intraocular pressure was elevated, requiring fluid drainage and initiation of three different eye drops. - Continue regular follow-ups with ophthalmologist  Chronic neck pain Chronic neck pain managed with tizanidine  at bedtime to relax shoulders and prevent headaches. Tylenol  used during the day for pain management. - Refill tizanidine  4 mg for bedtime use  Mucus production, upper airway Chronic mucus production in the upper airway. Current medication is effective in managing symptoms.    Rockie Agent, MD   03/28/2024   After Visit Summary: (In Person-Printed) AVS printed and given to the patient

## 2024-03-27 LAB — CMP14+EGFR
ALT: 10 IU/L (ref 0–32)
AST: 19 IU/L (ref 0–40)
Albumin: 4 g/dL (ref 3.7–4.7)
Alkaline Phosphatase: 73 IU/L (ref 44–121)
BUN/Creatinine Ratio: 16 (ref 12–28)
BUN: 12 mg/dL (ref 8–27)
Bilirubin Total: 0.3 mg/dL (ref 0.0–1.2)
CO2: 19 mmol/L — ABNORMAL LOW (ref 20–29)
Calcium: 10.1 mg/dL (ref 8.7–10.3)
Chloride: 103 mmol/L (ref 96–106)
Creatinine, Ser: 0.74 mg/dL (ref 0.57–1.00)
Globulin, Total: 3.1 g/dL (ref 1.5–4.5)
Glucose: 94 mg/dL (ref 70–99)
Potassium: 4.9 mmol/L (ref 3.5–5.2)
Sodium: 136 mmol/L (ref 134–144)
Total Protein: 7.1 g/dL (ref 6.0–8.5)
eGFR: 78 mL/min/1.73 (ref >59)

## 2024-03-27 LAB — LIPID PANEL
Chol/HDL Ratio: 2.1 ratio (ref 0.0–4.4)
Cholesterol, Total: 148 mg/dL (ref 100–199)
HDL: 72 mg/dL (ref >39)
LDL Chol Calc (NIH): 58 mg/dL (ref 0–99)
Triglycerides: 98 mg/dL (ref 0–149)
VLDL Cholesterol Cal: 18 mg/dL (ref 5–40)

## 2024-03-27 LAB — CBC
Hematocrit: 35.3 % (ref 34.0–46.6)
Hemoglobin: 11.6 g/dL (ref 11.1–15.9)
MCH: 34.4 pg — ABNORMAL HIGH (ref 26.6–33.0)
MCHC: 32.9 g/dL (ref 31.5–35.7)
MCV: 105 fL — ABNORMAL HIGH (ref 79–97)
Platelets: 272 x10E3/uL (ref 150–450)
RBC: 3.37 x10E6/uL — ABNORMAL LOW (ref 3.77–5.28)
RDW: 13.2 % (ref 11.7–15.4)
WBC: 8.1 x10E3/uL (ref 3.4–10.8)

## 2024-03-27 LAB — HEMOGLOBIN A1C
Est. average glucose Bld gHb Est-mCnc: 108 mg/dL
Hgb A1c MFr Bld: 5.4 % (ref 4.8–5.6)

## 2024-03-29 ENCOUNTER — Ambulatory Visit: Payer: Self-pay | Admitting: Family Medicine

## 2024-04-04 ENCOUNTER — Other Ambulatory Visit: Payer: Self-pay | Admitting: Family Medicine

## 2024-04-11 ENCOUNTER — Ambulatory Visit: Payer: Self-pay | Admitting: *Deleted

## 2024-04-11 NOTE — Telephone Encounter (Signed)
  FYI Only or Action Required?: Action required by provider: request for appointment and to see if patient can be worked in today if possible for worsening hip pain.  Patient was last seen in primary care on 03/26/2024 by Sharma Coyer, MD.  Called Nurse Triage reporting Hip Pain.  Symptoms began about a month ago.  Interventions attempted: OTC medications: tylenol .  Symptoms are: gradually worsening.  Triage Disposition: See HCP Within 4 Hours (Or PCP Triage)  Patient/caregiver understands and will follow disposition?: Yes                Copied from CRM #[id number]. Topic: Clinical - Red Word Triage >> Apr 11, 2024  8:51 AM Melanie Carrillo wrote: Red Word that prompted transfer to Nurse Triage: Severe hip pain on the right side. Fractured left femur a few years ago, believes she is overcompensating.    ----------------------------------------------------------------------- From previous Reason for Contact - Scheduling: Patient/patient representative is calling to schedule an appointment. Refer to attachments for appointment information. Reason for Disposition  [1] SEVERE pain (e.g., excruciating, unable to do any normal activities) AND [2] not improved after 2 hours of pain medicine  Answer Assessment - Initial Assessment Questions No available appt today with any provider. Scheduled tomorrow with other provider. None available with PCP. Please advise if patient can be seen today . Patient daughter on DPR reports multiple appt for family tomorrow and she does not think patient can withstand sitting in UC or ED for hours for evaluation. Patient daughter requesting call back      1. LOCATION and RADIATION: Where is the pain located? Does the pain spread (shoot) anywhere else?     Right hip and back  2. QUALITY: What does the pain feel like?  (e.g., sharp, dull, aching, burning)     Worsening with walking and sitting  3. SEVERITY: How bad is the pain? What  does it keep you from doing?   (Scale 1-10; or mild, moderate, severe)     severe 4. ONSET: When did the pain start? Does it come and go, or is it there all the time?     Greater than a month  5. WORK OR EXERCISE: Has there been any recent work or exercise that involved this part of the body?      Na  6. CAUSE: What do you think is causing the hip pain?      Not sure  7. AGGRAVATING FACTORS: What makes the hip pain worse? (e.g., walking, climbing stairs, running)     Walking  uses walker  8. OTHER SYMPTOMS: Do you have any other symptoms? (e.g., back pain, pain shooting down leg,  fever, rash)     Low back pain , right hip pain  Protocols used: Hip Pain-A-AH

## 2024-04-12 ENCOUNTER — Encounter: Payer: Self-pay | Admitting: Family Medicine

## 2024-04-12 ENCOUNTER — Ambulatory Visit (INDEPENDENT_AMBULATORY_CARE_PROVIDER_SITE_OTHER): Admitting: Family Medicine

## 2024-04-12 VITALS — BP 103/64 | HR 71 | Ht 64.0 in | Wt 147.0 lb

## 2024-04-12 DIAGNOSIS — M1712 Unilateral primary osteoarthritis, left knee: Secondary | ICD-10-CM

## 2024-04-12 DIAGNOSIS — M1611 Unilateral primary osteoarthritis, right hip: Secondary | ICD-10-CM | POA: Diagnosis not present

## 2024-04-12 DIAGNOSIS — M810 Age-related osteoporosis without current pathological fracture: Secondary | ICD-10-CM | POA: Diagnosis not present

## 2024-04-12 DIAGNOSIS — M545 Low back pain, unspecified: Secondary | ICD-10-CM

## 2024-04-12 MED ORDER — OXYCODONE-ACETAMINOPHEN 5-325 MG PO TABS: 1.0000 | ORAL_TABLET | ORAL | 0 refills | Status: AC | PRN

## 2024-04-12 NOTE — Progress Notes (Signed)
 Acute Office Visit  Subjective:     Patient ID: Melanie Carrillo, female    DOB: 09/03/1934, 89 y.o.   MRN: [id number]  Chief Complaint  Patient presents with   Knee Pain    L knee pain couple weeks ago    Hip Pain    R hip pain around the same time     Melanie Carrillo is a 89 yo F with a history of osteoporosis, left knee osteoarthritis, and left femur replacement who presents acutely for right hip pain and left knee pain.  On interview, she reports a two month history of right hip pain. She states that her right hip pain started after months of altering her gait because of her left femur surgery two years ago. She describes the right hip pain as a sharp pain that radiates slightly to her lumbar and sacral back. She denies radiation down her leg. She reports that her strength and sensation are intact in her right leg. She describes the pain as a 5/10 with pain that is worse with movement. Only rest helps her pain. She denies any associated fever, chills, or sweating.  In addition to her right hip pain, she reports left knee pain. She states that after trying to switch her gait back to normal because of her right hip pain, her left knee experienced throbbing pain. She states that the pain has been going on for 3-4 days with tenderness to touch. She describes a deep throbbing and aching pain in the joint, making it difficult to move around. She denies any radiation, but endorses weakness in the leg ever since the femur replacement surgery. She would like to discuss next steps to help her with her pain.  She reports not other concerns today.    ROS      Objective:    BP 103/64   Pulse 71   Ht 5' 4 (1.626 m)   Wt 147 lb (66.7 kg)   LMP  (LMP Unknown)   SpO2 100%   BMI 25.23 kg/m    Physical Exam Vitals reviewed.  Constitutional:      General: She is not in acute distress.    Appearance: Normal appearance. She is not ill-appearing or diaphoretic.  HENT:     Head:  Normocephalic.     Right Ear: External ear normal.     Left Ear: External ear normal.     Nose: Nose normal.     Mouth/Throat:     Mouth: Mucous membranes are moist.  Eyes:     General: No scleral icterus.    Conjunctiva/sclera: Conjunctivae normal.  Cardiovascular:     Rate and Rhythm: Normal rate and regular rhythm.     Pulses: Normal pulses.     Heart sounds: Normal heart sounds. No murmur heard.    No friction rub. No gallop.  Pulmonary:     Effort: Pulmonary effort is normal. No respiratory distress.     Breath sounds: Normal breath sounds. No wheezing.  Abdominal:     Palpations: Abdomen is soft.  Musculoskeletal:     Cervical back: Normal range of motion.     Lumbar back: Tenderness present.     Right hip: Tenderness present.     Left knee: Tenderness present.     Right lower leg: No edema.     Left lower leg: No edema.     Comments: Tenderness along right hip and lumbar/sacral spine.  Skin:    General: Skin is warm and  dry.     Capillary Refill: Capillary refill takes less than 2 seconds.  Neurological:     Mental Status: She is alert and oriented to person, place, and time.  Psychiatric:        Mood and Affect: Mood normal.     No results found for any visits on 04/12/24.      Assessment & Plan:   Problem List Items Addressed This Visit       Musculoskeletal and Integument   Localized osteoarthritis of left knee   Relevant Medications   oxyCODONE -acetaminophen  (PERCOCET/ROXICET) 5-325 MG tablet   Other Relevant Orders   DG Knee Complete 4 Views Left   Ambulatory referral to Orthopedic Surgery   Other Visit Diagnoses       Arthritis of right hip    -  Primary   Relevant Medications   oxyCODONE -acetaminophen  (PERCOCET/ROXICET) 5-325 MG tablet   Other Relevant Orders   DG Hip Unilat W OR W/O Pelvis 2-3 Views Right   Ambulatory referral to Orthopedic Surgery     Age-related osteoporosis without current pathological fracture       Relevant Orders    DG Knee Complete 4 Views Left   DG Hip Unilat W OR W/O Pelvis 2-3 Views Right   DG Lumbar Spine Complete     Acute right-sided low back pain without sciatica       Relevant Medications   oxyCODONE -acetaminophen  (PERCOCET/ROXICET) 5-325 MG tablet   Other Relevant Orders   DG Lumbar Spine Complete      Localized osteoarthritis of left knee Patient presents with a chronic history of osteoarthritis of the left knee. Reports acute worsening, especially after period of not using the joint much following femur replacement surgery. Describes worsening pain over the last few days. On exam, severe tenderness to light palpation. - Start percocet as needed for pain management - Order Xray of L Knee - Amb ref to ortho  Arthritis of right hip Age-related osteoporosis without current pathological fracture Acute right-sided low back pain without sciatica Patient presents with right hip pain after period of altering her gait following left femur surgery with pain radiating in the lumbar and sacral region. Strength and sensation intact without radiation of pain. History of osteoporosis with fractures making possibility of hip arthritis and vertebral compression fracture higher. - Start percocet as needed for pain management - Order Xray of R hip, sacral spine, and lumbar spine - Amb ref to ortho   Meds ordered this encounter  Medications   oxyCODONE -acetaminophen  (PERCOCET/ROXICET) 5-325 MG tablet    Sig: Take 1 tablet by mouth every 4 (four) hours as needed for up to 5 days for severe pain (pain score 7-10).    Dispense:  20 tablet    Refill:  0    Return if symptoms worsen or fail to improve.  Patient seen along with MS3 student, Elia Blanch. I personally evaluated this patient along with the student, and verified all aspects of the history, physical exam, and medical decision making as documented by the student. I agree with the student's documentation and have made all necessary  edits.  Nancee Brownrigg, Jon HERO, MD, MPH [facility]

## 2024-04-12 NOTE — Telephone Encounter (Signed)
 Noted

## 2024-04-13 ENCOUNTER — Ambulatory Visit
Admission: RE | Admit: 2024-04-13 | Discharge: 2024-04-13 | Disposition: A | Discharge status: Home / Self Care | Source: Ambulatory Visit | Attending: Family Medicine | Admitting: Family Medicine

## 2024-04-13 ENCOUNTER — Ambulatory Visit
Admission: RE | Admit: 2024-04-13 | Discharge: 2024-04-13 | Disposition: A | Discharge status: Home / Self Care | Attending: Family Medicine | Admitting: Family Medicine

## 2024-04-13 DIAGNOSIS — M1611 Unilateral primary osteoarthritis, right hip: Secondary | ICD-10-CM

## 2024-04-13 DIAGNOSIS — M810 Age-related osteoporosis without current pathological fracture: Secondary | ICD-10-CM | POA: Insufficient documentation

## 2024-04-13 DIAGNOSIS — M545 Low back pain, unspecified: Secondary | ICD-10-CM | POA: Insufficient documentation

## 2024-04-13 DIAGNOSIS — M47817 Spondylosis without myelopathy or radiculopathy, lumbosacral region: Secondary | ICD-10-CM | POA: Diagnosis not present

## 2024-04-13 DIAGNOSIS — M1712 Unilateral primary osteoarthritis, left knee: Secondary | ICD-10-CM | POA: Diagnosis not present

## 2024-04-17 ENCOUNTER — Ambulatory Visit: Payer: Self-pay | Admitting: Family Medicine

## 2024-04-19 DIAGNOSIS — H401123 Primary open-angle glaucoma, left eye, severe stage: Secondary | ICD-10-CM | POA: Diagnosis not present

## 2024-04-19 DIAGNOSIS — H209 Unspecified iridocyclitis: Secondary | ICD-10-CM | POA: Diagnosis not present

## 2024-04-19 DIAGNOSIS — H401111 Primary open-angle glaucoma, right eye, mild stage: Secondary | ICD-10-CM | POA: Diagnosis not present

## 2024-04-20 DIAGNOSIS — M1611 Unilateral primary osteoarthritis, right hip: Secondary | ICD-10-CM | POA: Diagnosis not present

## 2024-04-20 DIAGNOSIS — M1712 Unilateral primary osteoarthritis, left knee: Secondary | ICD-10-CM | POA: Diagnosis not present

## 2024-04-20 DIAGNOSIS — M62551 Muscle wasting and atrophy, not elsewhere classified, right thigh: Secondary | ICD-10-CM | POA: Diagnosis not present

## 2024-04-20 DIAGNOSIS — M67853 Other specified disorders of tendon, right hip: Secondary | ICD-10-CM | POA: Diagnosis not present

## 2024-04-20 DIAGNOSIS — M25551 Pain in right hip: Secondary | ICD-10-CM | POA: Diagnosis not present

## 2024-04-24 ENCOUNTER — Other Ambulatory Visit: Payer: Self-pay | Admitting: Family Medicine

## 2024-05-01 ENCOUNTER — Encounter: Payer: Self-pay | Admitting: Student

## 2024-05-01 DIAGNOSIS — S72142D Displaced intertrochanteric fracture of left femur, subsequent encounter for closed fracture with routine healing: Secondary | ICD-10-CM | POA: Diagnosis not present

## 2024-05-02 ENCOUNTER — Other Ambulatory Visit: Payer: Self-pay | Admitting: Student

## 2024-05-02 DIAGNOSIS — M898X5 Other specified disorders of bone, thigh: Secondary | ICD-10-CM

## 2024-05-03 ENCOUNTER — Ambulatory Visit
Admission: RE | Admit: 2024-05-03 | Discharge: 2024-05-03 | Disposition: A | Discharge status: Home / Self Care | Source: Ambulatory Visit | Attending: Student | Admitting: Student

## 2024-05-03 DIAGNOSIS — M1712 Unilateral primary osteoarthritis, left knee: Secondary | ICD-10-CM | POA: Diagnosis not present

## 2024-05-03 DIAGNOSIS — M898X5 Other specified disorders of bone, thigh: Secondary | ICD-10-CM | POA: Diagnosis not present

## 2024-05-03 DIAGNOSIS — K5730 Diverticulosis of large intestine without perforation or abscess without bleeding: Secondary | ICD-10-CM | POA: Diagnosis not present

## 2024-05-03 DIAGNOSIS — M1612 Unilateral primary osteoarthritis, left hip: Secondary | ICD-10-CM | POA: Diagnosis not present

## 2024-05-03 DIAGNOSIS — S72142A Displaced intertrochanteric fracture of left femur, initial encounter for closed fracture: Secondary | ICD-10-CM | POA: Diagnosis not present

## 2024-05-15 DIAGNOSIS — M898X5 Other specified disorders of bone, thigh: Secondary | ICD-10-CM | POA: Diagnosis not present

## 2024-05-18 ENCOUNTER — Telehealth: Payer: Self-pay

## 2024-05-18 NOTE — Telephone Encounter (Signed)
 Received pre op clearance from Melanie Carrillo. Patient will need OV for clearance per Dr. Lang. Attempted to contact patient to schedule this- no answer/ LVM. Please help get her scheduled for OV for clearance. Thx!

## 2024-05-22 DIAGNOSIS — M25552 Pain in left hip: Secondary | ICD-10-CM | POA: Diagnosis not present

## 2024-05-24 ENCOUNTER — Encounter: Payer: Self-pay | Admitting: Family Medicine

## 2024-05-24 ENCOUNTER — Ambulatory Visit: Admitting: Family Medicine

## 2024-05-24 VITALS — BP 130/96 | Ht 64.0 in | Wt 150.7 lb

## 2024-05-24 DIAGNOSIS — Z23 Encounter for immunization: Secondary | ICD-10-CM

## 2024-05-24 DIAGNOSIS — Z961 Presence of intraocular lens: Secondary | ICD-10-CM | POA: Diagnosis not present

## 2024-05-24 DIAGNOSIS — Z01818 Encounter for other preprocedural examination: Secondary | ICD-10-CM

## 2024-05-24 DIAGNOSIS — H209 Unspecified iridocyclitis: Secondary | ICD-10-CM | POA: Diagnosis not present

## 2024-05-24 DIAGNOSIS — H401111 Primary open-angle glaucoma, right eye, mild stage: Secondary | ICD-10-CM | POA: Diagnosis not present

## 2024-05-24 DIAGNOSIS — H401123 Primary open-angle glaucoma, left eye, severe stage: Secondary | ICD-10-CM | POA: Diagnosis not present

## 2024-05-24 NOTE — Progress Notes (Signed)
 Established patient visit   Patient: Melanie Carrillo   DOB: 08-May-1935   89 y.o. Female  MRN: [id number] Visit Date: 05/24/2024  Today's healthcare provider: Jon Eva, MD   Chief Complaint  Patient presents with   Pre-op Exam    Patient reports no date yet as they are waiting on clearance before scheduling   Subjective    HPI HPI     Pre-op Exam    Additional comments: Patient reports no date yet as they are waiting on clearance before scheduling      Last edited by Lilian Fitzpatrick, CMA on 05/24/2024  1:40 PM.       Discussed the use of AI scribe software for clinical note transcription with the patient, who gave verbal consent to proceed.  History of Present Illness   Melanie Carrillo is an 89 year old female who presents for preoperative clearance for a left total hip replacement. She is accompanied by her daughter.  She is scheduled for a conversion from a previous hip surgery to a left total hip replacement. She uses a walker for indoor walking and manages short distances. She has not attempted to walk one to two blocks outside or climb stairs independently due to her hip condition. She can perform light housework such as washing dishes and making her bed but finds moderate housework challenging.  No recent heart attack, heart failure, chest pain, or arrhythmias. No diabetes, kidney disease, or stroke. She has high blood pressure and has never smoked. She manages her daily activities with some assistance from her daughter, who helps with housework, cooking, and driving.      Pt is a 89 y.o. female who is here for preoperative clearance for conversion from previous hip surgery to L THA  1) High Risk Cardiac Conditions  1) Recent MI - No.  2) Decompensated Heart Failure - No.  3) Unstable angina - No.  4) Symptomatic arrythmia - No.  5) Sx Valvular Disease - No.  2) Intermediate Risk Factors - DM, CKD, CVA, CHF, CAD - No.  2) Functional Status - > 4 mets  (Walk, run, climb stairs) Yes.  Melanie Carrillo Activity Status Index: 16.2 (4.73 METs)  3) Surgery Specific Risk Intermediate (Carotid, Head and Neck, Orthopaedic )  4) Further Noninvasive evaluation -   1) EKG - No.   1) Hx of CVA, CAD, DM, CKD  2) Echo - No.   1) Worsening dyspnea   3) Stress Testing - Active Cardiac Disease - No.  5) Need for medical therapy - Beta Blocker, Statins indicated ? No.  Medications: Outpatient Medications Prior to Visit  Medication Sig   brimonidine  (ALPHAGAN ) 0.2 % ophthalmic solution Place 1 drop into both eyes in the morning and at bedtime.   Cholecalciferol  (VITAMIN D -3) 125 MCG (5000 UT) TABS Take 5,000 Units by mouth in the morning.   feeding supplement (ENSURE ENLIVE / ENSURE PLUS) LIQD Take 237 mLs by mouth 2 (two) times daily between meals.   guaiFENesin  (MUCINEX ) 600 MG 12 hr tablet Take 600 mg by mouth at bedtime.   latanoprost  (XALATAN ) 0.005 % ophthalmic solution Place 1 drop into both eyes at bedtime.   losartan  (COZAAR ) 100 MG tablet TAKE 1 TABLET (100 MG TOTAL) BY MOUTH ONCE DAILY FOR 90 DAYS   lovastatin (MEVACOR) 40 MG tablet TAKE 1 TABLET (40 MG TOTAL) BY MOUTH DAILY WITH DINNER FOR 90 DAYS   metoprolol  tartrate (LOPRESSOR ) 25 MG tablet Take 0.5  tablets (12.5 mg total) by mouth 2 (two) times daily.   montelukast  (SINGULAIR ) 10 MG tablet TAKE 1 TABLET BY MOUTH EVERYDAY AT BEDTIME   Multiple Vitamin (MULTIVITAMIN) tablet Take 1 tablet by mouth daily.   omeprazole  (PRILOSEC) 40 MG capsule Take 1 capsule (40 mg total) by mouth at bedtime.   sertraline  (ZOLOFT ) 50 MG tablet TAKE 1 TABLET BY MOUTH EVERY DAY   tiZANidine  (ZANAFLEX ) 4 MG tablet Take 0.5-1.5 tablets (2-6 mg total) by mouth every 8 (eight) hours as needed for muscle spasms (muscle tightness).   traZODone  (DESYREL ) 50 MG tablet Take 1 tablet (50 mg total) by mouth at bedtime.   [DISCONTINUED] simethicone  (MYLICON) 80 MG chewable tablet Chew 80 mg by mouth as needed. (Patient not taking:  Reported on 05/24/2024)   No facility-administered medications prior to visit.   Review of Systems     Objective    BP (!) 130/96 (BP Location: Left Arm, Patient Position: Sitting, Cuff Size: Normal)   Ht 5' 4 (1.626 m)   Wt 150 lb 11.2 oz (68.4 kg)   LMP  (LMP Unknown)   SpO2 100%   BMI 25.87 kg/m    Physical Exam Vitals reviewed.  Constitutional:      General: She is not in acute distress.    Appearance: Normal appearance. She is well-developed. She is not diaphoretic.  HENT:     Head: Normocephalic and atraumatic.  Eyes:     General: No scleral icterus.    Conjunctiva/sclera: Conjunctivae normal.  Neck:     Thyroid : No thyromegaly.  Cardiovascular:     Rate and Rhythm: Normal rate and regular rhythm.     Heart sounds: Normal heart sounds. No murmur heard. Pulmonary:     Effort: Pulmonary effort is normal. No respiratory distress.     Breath sounds: Normal breath sounds. No wheezing, rhonchi or rales.  Musculoskeletal:     Cervical back: Neck supple.     Right lower leg: No edema.     Left lower leg: No edema.  Lymphadenopathy:     Cervical: No cervical adenopathy.  Skin:    General: Skin is warm and dry.  Neurological:     Mental Status: She is alert. Mental status is at baseline.  Psychiatric:        Mood and Affect: Mood normal.        Behavior: Behavior normal.      No results found for any visits on 05/24/24.  Assessment & Plan     Problem List Items Addressed This Visit   None Visit Diagnoses       Pre-op examination    -  Primary     Immunization due       Relevant Orders   Flu vaccine HIGH DOSE PF(Fluzone Trivalent) (Completed)      I have independently evaluated patient.  Melanie Carrillo is a 89 y.o. female who is moderate risk for a intermediate risk surgery.  There are not modifiable risk factors (smoking, etc). Melanie Carrillo's RCRI/NSQIP calculation for MACE is: 0.5 (slightly above average). Any serious complication 2.8% (above  average).       Preoperative Medical Evaluation for Left Total Hip Replacement Preoperative evaluation for conversion from previous hip surgery to left total hip replacement. Surgery planned under spinal anesthesia to minimize risk, allowing her to breathe independently. No recent myocardial infarction, heart failure, angina, arrhythmias, diabetes, renal disease, or cerebrovascular accident. Functional status: independent in self-care, limited mobility due to hip. Risk  of serious complication estimated at 2.8%, cardiac complication at 0.5%, and discharge to rehab facility at 17%. - Complete preoperative clearance for left total hip replacement - Send preoperative evaluation and risk assessment to surgeon - Coordinate with anesthesiology for preoperative visit - Ensure she holds anti-inflammatory medications prior to surgery - Confirm no aspirin  use prior to surgery  Hypertension Hypertension noted.       Return if symptoms worsen or fail to improve.      I personally spent a total of 42 minutes in the care of the patient today including preparing to see the patient, getting/reviewing separately obtained history, performing a medically appropriate exam/evaluation, counseling and educating, documenting clinical information in the EHR, and coordinating care, form completion.  Jon Eva, MD  [facility] [phone] (phone) [phone] (fax)  [facility]

## 2024-05-31 ENCOUNTER — Ambulatory Visit: Admitting: Family Medicine

## 2024-05-31 DIAGNOSIS — H401123 Primary open-angle glaucoma, left eye, severe stage: Secondary | ICD-10-CM | POA: Diagnosis not present

## 2024-05-31 DIAGNOSIS — H209 Unspecified iridocyclitis: Secondary | ICD-10-CM | POA: Diagnosis not present

## 2024-05-31 DIAGNOSIS — Z961 Presence of intraocular lens: Secondary | ICD-10-CM | POA: Diagnosis not present

## 2024-05-31 DIAGNOSIS — H401111 Primary open-angle glaucoma, right eye, mild stage: Secondary | ICD-10-CM | POA: Diagnosis not present

## 2024-06-12 DIAGNOSIS — H209 Unspecified iridocyclitis: Secondary | ICD-10-CM | POA: Diagnosis not present

## 2024-06-12 DIAGNOSIS — H401123 Primary open-angle glaucoma, left eye, severe stage: Secondary | ICD-10-CM | POA: Diagnosis not present

## 2024-06-12 DIAGNOSIS — H401111 Primary open-angle glaucoma, right eye, mild stage: Secondary | ICD-10-CM | POA: Diagnosis not present

## 2024-06-14 DIAGNOSIS — M25552 Pain in left hip: Secondary | ICD-10-CM | POA: Diagnosis not present

## 2024-06-14 DIAGNOSIS — S7292XK Unspecified fracture of left femur, subsequent encounter for closed fracture with nonunion: Secondary | ICD-10-CM | POA: Diagnosis not present

## 2024-06-15 NOTE — Progress Notes (Signed)
 Surgery orders requested via Epic inbox.

## 2024-06-16 ENCOUNTER — Other Ambulatory Visit: Payer: Self-pay | Admitting: Family Medicine

## 2024-06-16 DIAGNOSIS — K219 Gastro-esophageal reflux disease without esophagitis: Secondary | ICD-10-CM

## 2024-06-20 NOTE — Progress Notes (Signed)
 Second request for pre op orders in [facility] left voicemail with [employer].

## 2024-06-20 NOTE — Patient Instructions (Signed)
 SURGICAL WAITING ROOM VISITATION  Patients having surgery or a procedure may have no more than 2 support people in the waiting area - these visitors may rotate.    Children under the age of 65 must have an adult with them who is not the patient.  Visitors with respiratory illnesses are discouraged from visiting and should remain at home.  If the patient needs to stay at the hospital during part of their recovery, the visitor guidelines for inpatient rooms apply. Pre-op nurse will coordinate an appropriate time for 1 support person to accompany patient in pre-op.  This support person may not rotate.    Please refer to the [facility] website for the visitor guidelines for Inpatients (after your surgery is over and you are in a regular room).       Your procedure is scheduled on: 07/02/24   Report to [facility] Main Entrance    Report to admitting at 11 AM   Call this number if you have problems the morning of surgery [phone]   Do not eat food :After Midnight.   After Midnight you may have the following liquids until ______ AM/ PM DAY OF SURGERY  Water Non-Citrus Juices (without pulp, NO RED-Apple, White grape, White cranberry) Black Coffee (NO MILK/CREAM OR CREAMERS, sugar ok)  Clear Tea (NO MILK/CREAM OR CREAMERS, sugar ok) regular and decaf                             Plain Jell-O (NO RED)                                           Fruit ices (not with fruit pulp, NO RED)                                     Popsicles (NO RED)                                                               Sports drinks like Gatorade (NO RED)              The day of surgery:  Drink ONE (1) Pre-Surgery Clear Ensure  at AM the morning of surgery. Drink in one sitting. Do not sip.  This drink was given to you during your hospital  pre-op appointment visit. Nothing else to drink after completing the  Pre-Surgery Clear Ensure     Oral Hygiene is also important to reduce your risk  of infection.                                    Remember - BRUSH YOUR TEETH THE MORNING OF SURGERY WITH YOUR REGULAR TOOTHPASTE  DENTURES WILL BE REMOVED PRIOR TO SURGERY PLEASE DO NOT APPLY Poly grip OR ADHESIVES!!!     Stop all vitamins and herbal supplements 7 days before surgery.   Take these medicines the morning of surgery with A SIP OF WATER: metoprolol , montelukast (singulair ), Omeprazole , Sertraline (zoloft ), Eye  drops.               Do not take Losartan  the morning of surgery.  Bring CPAP mask and tubing day of surgery.                              You may not have any metal on your body including hair pins, jewelry, and body piercing             Do not wear make-up, lotions, powders, perfumes/cologne, or deodorant  Do not wear nail polish including gel and S&S, artificial/acrylic nails, or any other type of covering on natural nails including finger and toenails. If you have artificial nails, gel coating, etc. that needs to be removed by a nail salon please have this removed prior to surgery or surgery may need to be canceled/ delayed if the surgeon/ anesthesia feels like they are unable to be safely monitored.   Do not shave  48 hours prior to surgery.         Do not bring valuables to the hospital. [facility] IS NOT             RESPONSIBLE   FOR VALUABLES.   Contacts, glasses, dentures or bridgework may not be worn into surgery.   Bring small overnight bag day of surgery.   DO NOT BRING YOUR HOME MEDICATIONS TO THE HOSPITAL. PHARMACY WILL DISPENSE MEDICATIONS LISTED ON YOUR MEDICATION LIST TO YOU DURING YOUR ADMISSION IN THE HOSPITAL!    Patients discharged on the day of surgery will not be allowed to drive home.  Someone NEEDS to stay with you for the first 24 hours after anesthesia.   Special Instructions: Bring a copy of your healthcare power of attorney and living will documents the day of surgery if you haven't scanned them before.              Please read over  the following fact sheets you were given: IF YOU HAVE QUESTIONS ABOUT YOUR PRE-OP INSTRUCTIONS PLEASE [phone] [place]   If you received a COVID test during your pre-op visit  it is requested that you wear a mask when out in public, stay away from anyone that may not be feeling well and notify your surgeon if you develop symptoms. If you test positive for Covid or have been in contact with anyone that has tested positive in the last 10 days please notify you surgeon.      Pre-operative 4 CHG Bath Instructions  DYNA-Hex 4 Chlorhexidine  Gluconate 4% Solution Antiseptic 4 fl. oz   You can play a key role in reducing the risk of infection after surgery. Your skin needs to be as free of germs as possible. You can reduce the number of germs on your skin by washing with CHG (chlorhexidine  gluconate) soap before surgery. CHG is an antiseptic soap that kills germs and continues to kill germs even after washing.   DO NOT use if you have an allergy to chlorhexidine /CHG or antibacterial soaps. If your skin becomes reddened or irritated, stop using the CHG and notify one of our RNs at   Please shower with the CHG soap starting 4 days before surgery using the following schedule:     Please keep in mind the following:  DO NOT shave, including legs and underarms, starting the day of your first shower.   You may shave your face at any point before/day of surgery.  Place  clean sheets on your bed the day you start using CHG soap. Use a clean washcloth (not used since being washed) for each shower. DO NOT sleep with pets once you start using the CHG.  CHG Shower Instructions:  If you choose to wash your hair and private area, wash first with your normal shampoo/soap.  After you use shampoo/soap, rinse your hair and body thoroughly to remove shampoo/soap residue.  Turn the water OFF and apply about 3 tablespoons (45 ml) of CHG soap to a CLEAN washcloth.  Apply CHG soap ONLY FROM YOUR NECK DOWN TO YOUR  TOES (washing for 3-5 minutes)  DO NOT use CHG soap on face, private areas, open wounds, or sores.  Pay special attention to the area where your surgery is being performed.  If you are having back surgery, having someone wash your back for you may be helpful. Wait 2 minutes after CHG soap is applied, then you may rinse off the CHG soap.  Pat dry with a clean towel  Put on clean clothes/pajamas   If you choose to wear lotion, please use ONLY the CHG-compatible lotions on the back of this paper.     Additional instructions for the day of surgery: DO NOT APPLY any lotions, deodorants, cologne, or perfumes.   Put on clean/comfortable clothes.  Brush your teeth.  Ask your nurse before applying any prescription medications to the skin.   CHG Compatible Lotions   Aveeno Moisturizing lotion  Cetaphil Moisturizing Cream  Cetaphil Moisturizing Lotion  Clairol Herbal Essence Moisturizing Lotion, Dry Skin  Clairol Herbal Essence Moisturizing Lotion, Extra Dry Skin  Clairol Herbal Essence Moisturizing Lotion, Normal Skin  Curel Age Defying Therapeutic Moisturizing Lotion with Alpha Hydroxy  Curel Extreme Care Body Lotion  Curel Soothing Hands Moisturizing Hand Lotion  Curel Therapeutic Moisturizing Cream, Fragrance-Free  Curel Therapeutic Moisturizing Lotion, Fragrance-Free  Curel Therapeutic Moisturizing Lotion, Original Formula  Eucerin Daily Replenishing Lotion  Eucerin Dry Skin Therapy Plus Alpha Hydroxy Crme  Eucerin Dry Skin Therapy Plus Alpha Hydroxy Lotion  Eucerin Original Crme  Eucerin Original Lotion  Eucerin Plus Crme Eucerin Plus Lotion  Eucerin TriLipid Replenishing Lotion  Keri Anti-Bacterial Hand Lotion  Keri Deep Conditioning Original Lotion Dry Skin Formula Softly Scented  Keri Deep Conditioning Original Lotion, Fragrance Free Sensitive Skin Formula  Keri Lotion Fast Absorbing Fragrance Free Sensitive Skin Formula  Keri Lotion Fast Absorbing Softly Scented Dry Skin  Formula  Keri Original Lotion  Keri Skin Renewal Lotion Keri Silky Smooth Lotion  Keri Silky Smooth Sensitive Skin Lotion  Nivea Body Creamy Conditioning Oil  Nivea Body Extra Enriched Teacher, Adult Education Moisturizing Lotion Nivea Crme  Nivea Skin Firming Lotion  NutraDerm 30 Skin Lotion  NutraDerm Skin Lotion  NutraDerm Therapeutic Skin Cream  NutraDerm Therapeutic Skin Lotion  ProShield Protective Hand Cream   WHAT IS A BLOOD TRANSFUSION? Blood Transfusion Information  A transfusion is the replacement of blood or some of its parts. Blood is made up of multiple cells which provide different functions. Red blood cells carry oxygen and are used for blood loss replacement. White blood cells fight against infection. Platelets control bleeding. Plasma helps clot blood. Other blood products are available for specialized needs, such as hemophilia or other clotting disorders. BEFORE THE TRANSFUSION  Who gives blood for transfusions?  Healthy volunteers who are fully evaluated to make sure their blood is safe. This is blood bank blood. Transfusion therapy is the safest it has  ever been in the practice of medicine. Before blood is taken from a donor, a complete history is taken to make sure that person has no history of diseases nor engages in risky social behavior (examples are intravenous drug use or sexual activity with multiple partners). The donor's travel history is screened to minimize risk of transmitting infections, such as malaria. The donated blood is tested for signs of infectious diseases, such as HIV and hepatitis. The blood is then tested to be sure it is compatible with you in order to minimize the chance of a transfusion reaction. If you or a relative donates blood, this is often done in anticipation of surgery and is not appropriate for emergency situations. It takes many days to process the donated blood. RISKS AND COMPLICATIONS Although  transfusion therapy is very safe and saves many lives, the main dangers of transfusion include:  Getting an infectious disease. Developing a transfusion reaction. This is an allergic reaction to something in the blood you were given. Every precaution is taken to prevent this. The decision to have a blood transfusion has been considered carefully by your caregiver before blood is given. Blood is not given unless the benefits outweigh the risks. AFTER THE TRANSFUSION Right after receiving a blood transfusion, you will usually feel much better and more energetic. This is especially true if your red blood cells have gotten low (anemic). The transfusion raises the level of the red blood cells which carry oxygen, and this usually causes an energy increase. The nurse administering the transfusion will monitor you carefully for complications. HOME CARE INSTRUCTIONS  No special instructions are needed after a transfusion. You may find your energy is better. Speak with your caregiver about any limitations on activity for underlying diseases you may have. SEEK MEDICAL CARE IF:  Your condition is not improving after your transfusion. You develop redness or irritation at the intravenous (IV) site. SEEK IMMEDIATE MEDICAL CARE IF:  Any of the following symptoms occur over the next 12 hours: Shaking chills. You have a temperature by mouth above 102 F (38.9 C), not controlled by medicine. Chest, back, or muscle pain. People around you feel you are not acting correctly or are confused. Shortness of breath or difficulty breathing. Dizziness and fainting. You get a rash or develop hives. You have a decrease in urine output. Your urine turns a dark color or changes to pink, red, or brown. Any of the following symptoms occur over the next 10 days: You have a temperature by mouth above 102 F (38.9 C), not controlled by medicine. Shortness of breath. Weakness after normal activity. The white part of the eye  turns yellow (jaundice). You have a decrease in the amount of urine or are urinating less often. Your urine turns a dark color or changes to pink, red, or brown. Document Released: 08/06/2000 Document Revised: 11/01/2011 Document Reviewed: 03/25/2008 [facility] Patient Information 2014 [place], [place].

## 2024-06-20 NOTE — Progress Notes (Signed)
 COVID Vaccine received:  []  No [x]  Yes Date of any COVID positive Test in last 90 days: no PCP - Rockie Davis Essex MD Cardiologist - n/a  Chest x-ray - 12/22/23 Epic EKG -  12/26/23 Epic Stress Test -  ECHO -  Cardiac Cath -   Bowel Prep - [x]  No  []   Yes ______  Pacemaker / ICD device [x]  No []  Yes   Spinal Cord Stimulator:[x]  No []  Yes       History of Sleep Apnea? []  No [x]  Yes   CPAP used?- [x]  No []  Yes    Does the patient monitor blood sugar?          [x]  No []  Yes  []  N/A  Patient has: [x]  NO Hx DM   []  Pre-DM                 []  DM1  []   DM2 Does patient have a [employer] or [employer]om? []  No []  Yes   Fasting Blood Sugar Ranges-  Checks Blood Sugar _____ times a day  GLP1 agonist / usual dose - no GLP1 instructions:  SGLT-2 inhibitors / usual dose - no SGLT-2 instructions:   Blood Thinner / Instructions:no Aspirin  Instructions:no  Comments:   Activity level: Patient is unable to climb a flight of stairs without difficulty; [x]  No CP  [x]  No SOB, but would have _knee and hip pain__   Patient can  perform ADLs without assistance.   Anesthesia review:   Patient denies shortness of breath, fever, cough and chest pain at PAT appointment.  Patient verbalized understanding and agreement to the Pre-Surgical Instructions that were given to them at this PAT appointment. Patient was also educated of the need to review these PAT instructions again prior to his/her surgery.I reviewed the appropriate phone numbers to call if they have any and questions or concerns.

## 2024-06-20 NOTE — Progress Notes (Signed)
 Request sent to Dr. Edna to send pre op orders for PST visit 06/22/24.

## 2024-06-21 ENCOUNTER — Ambulatory Visit: Payer: Self-pay | Admitting: Emergency Medicine

## 2024-06-21 DIAGNOSIS — S72002K Fracture of unspecified part of neck of left femur, subsequent encounter for closed fracture with nonunion: Secondary | ICD-10-CM

## 2024-06-21 NOTE — H&P (View-Only) (Signed)
 TOTAL HIP CONVERSION ADMISSION H&P  Patient is admitted for left conversion total hip arthroplasty.  Subjective:  Chief Complaint: left hip pain  HPI: Melanie Carrillo, 89 y.o. 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 The indications for the revision total hip arthroplasty are chronic nonunion of left femoral fracture.  Onset of symptoms was abrupt starting 1-2 years ago with gradually worsening course since that time.  Prior procedures on the left hip include IM nail placement and internal fixation.  Patient currently rates pain in the left hip at 10 out of 10 with activity.  There is night pain, worsening of pain with activity and weight bearing, trendelenberg gait, pain that interfers with activities of daily living, and pain with passive range of motion. Patient has evidence of chronic nonunion with loosening of hardware by imaging studies.  Dry tap on 05/22/24, however CRP and ESR were elevated.  This condition presents safety issues increasing the risk of falls.      Patient Active Problem List   Diagnosis Date Noted   Chronic hyponatremia 01/05/2024   Choledocholithiasis with chronic cholecystitis 04/28/2023   Localized osteoarthritis of left knee 03/22/2023   Calculus of gallbladder and bile duct with obstruction without cholecystitis 03/22/2023   Postmenopausal estrogen deficiency 11/26/2022   At risk for pill esophagitis 08/10/2022   Adjustment insomnia 07/21/2022   Other fatigue 07/21/2022   Loosening of intramedullary nail 04/27/2022   Degenerative disk disease 04/27/2022   Interstitial lung disease (HCC) 12/25/2021   Hyperlipidemia, mixed 09/07/2021   Obstructive sleep apnea 01/29/2014   GERD  (gastroesophageal reflux disease) 12/26/2012   Depression 12/26/2012   Essential hypertension 11/07/2002   Past Medical History:  Diagnosis Date   Allergy    Anxiety    Arthritis    Calculus of gallbladder and bile duct with obstruction without cholecystitis    Cataract    Choledocholithiasis    Colon cancer (HCC)    Constipation    DDD (degenerative disc disease), lumbar    Depression    Dyspnea    GERD (gastroesophageal reflux disease)    Glaucoma    Hip fracture (HCC) 12/25/2021   Hip fracture requiring operative repair (HCC) 2023   HLD (hyperlipidemia)    Hypertension    Interstitial lung disease (HCC)    Left hip postoperative wound infection    Obstructive sleep apnea 01/29/2014   No Cpap   Osteomyelitis (HCC)    Osteomyelitis (HCC) 06/24/2022   Osteoporosis    Rhabdomyolysis    Severe sepsis (HCC) 05/13/2023   Shingles     Past Surgical History:  Procedure Laterality Date   APPENDECTOMY     CARDIAC CATHETERIZATION     CHOLECYSTECTOMY     COLON SURGERY     COLONOSCOPY     ENDOSCOPIC RETROGRADE CHOLANGIOPANCREATOGRAPHY (ERCP) WITH PROPOFOL  N/A 04/28/2023   Procedure: ENDOSCOPIC RETROGRADE CHOLANGIOPANCREATOGRAPHY (ERCP) WITH PROPOFOL ;  Surgeon: Jinny Carmine, MD;  Location: [facility] ENDOSCOPY;  Service: Endoscopy;  Laterality: N/A;   EYE SURGERY Bilateral    cataracts   FEMUR IM NAIL Left 04/28/2022   Procedure: REVISION FIXATION OF LEFT FEMUR FRACTURE;  Surgeon: Kendal Franky SQUIBB, MD;  Location: [facility] OR;  Service: Orthopedics;  Laterality: Left;   FRACTURE SURGERY     HARDWARE  REMOVAL Left 04/28/2022   Procedure: HARDWARE REMOVAL;  Surgeon: Kendal Franky SQUIBB, MD;  Location: [facility] OR;  Service: Orthopedics;  Laterality: Left;   HERNIA REPAIR     I & D EXTREMITY Left 06/23/2022   Procedure: IRRIGATION AND DEBRIDEMENT LEFT HIP;  Surgeon: Kendal Franky SQUIBB, MD;  Location: [facility] OR;  Service: Orthopedics;  Laterality: Left;   I & D EXTREMITY Left 07/28/2022   Procedure: IRRIGATION  AND DEBRIDEMENT HIP;  Surgeon: Kendal Franky SQUIBB, MD;  Location: [facility] OR;  Service: Orthopedics;  Laterality: Left;   INTRAMEDULLARY (IM) NAIL INTERTROCHANTERIC Left 12/26/2021   Procedure: INTRAMEDULLARY (IM) NAIL INTERTROCHANTRIC;  Surgeon: Rollene Cough, MD;  Location: [facility] ORS;  Service: Orthopedics;  Laterality: Left;   MULTIPLE TOOTH EXTRACTIONS     full dentures   REMOVAL OF STONES  04/28/2023   Procedure: REMOVAL OF STONES;  Surgeon: Jinny Carmine, MD;  Location: [facility] ENDOSCOPY;  Service: Endoscopy;;   shoulder  surgery Left    SPHINCTEROTOMY  04/28/2023   Procedure: SPHINCTEROTOMY;  Surgeon: Jinny Carmine, MD;  Location: [facility] ENDOSCOPY;  Service: Endoscopy;;   TONSILLECTOMY     TUBAL LIGATION     UPPER GI ENDOSCOPY     WRIST SURGERY Right     Current Outpatient Medications  Medication Sig Dispense Refill Last Dose/Taking   brimonidine  (ALPHAGAN ) 0.2 % ophthalmic solution Place 1 drop into both eyes in the morning and at bedtime.      Cholecalciferol  (VITAMIN D -3) 125 MCG (5000 UT) TABS Take 5,000 Units by mouth in the morning.      feeding supplement (ENSURE ENLIVE / ENSURE PLUS) LIQD Take 237 mLs by mouth 2 (two) times daily between meals. 237 mL 12    guaiFENesin  (MUCINEX ) 600 MG 12 hr tablet Take 600 mg by mouth at bedtime.      latanoprost  (XALATAN ) 0.005 % ophthalmic solution Place 1 drop into both eyes at bedtime.      losartan  (COZAAR ) 100 MG tablet TAKE 1 TABLET (100 MG TOTAL) BY MOUTH ONCE DAILY FOR 90 DAYS 90 tablet 1    lovastatin (MEVACOR) 40 MG tablet TAKE 1 TABLET (40 MG TOTAL) BY MOUTH DAILY WITH DINNER FOR 90 DAYS 90 tablet 3    metoprolol  tartrate (LOPRESSOR ) 25 MG tablet Take 0.5 tablets (12.5 mg total) by mouth 2 (two) times daily. 90 tablet 2    montelukast  (SINGULAIR ) 10 MG tablet TAKE 1 TABLET BY MOUTH EVERYDAY AT BEDTIME 90 tablet 3    Multiple Vitamin (MULTIVITAMIN) tablet Take 1 tablet by mouth daily.      omeprazole  (PRILOSEC) 40 MG capsule TAKE 1 CAPSULE  BY MOUTH AT BEDTIME. 90 capsule 1    sertraline  (ZOLOFT ) 50 MG tablet TAKE 1 TABLET BY MOUTH EVERY DAY 90 tablet 1    tiZANidine  (ZANAFLEX ) 4 MG tablet Take 0.5-1.5 tablets (2-6 mg total) by mouth every 8 (eight) hours as needed for muscle spasms (muscle tightness). 90 tablet 2    traZODone  (DESYREL ) 50 MG tablet Take 1 tablet (50 mg total) by mouth at bedtime. 90 tablet 1    No current facility-administered medications for this visit.   No Known Allergies  Social History   Tobacco Use   Smoking status: Never    Passive exposure: Never   Smokeless tobacco: Never  Substance Use Topics   Alcohol use: Not Currently    Family History  Problem Relation Age of Onset   Hypertension Mother    Heart disease Mother    Heart disease  Father       Review of Systems  Musculoskeletal:  Positive for arthralgias.  All other systems reviewed and are negative.   Objective:  Physical Exam Constitutional:      General: She is not in acute distress.    Appearance: Normal appearance. She is not ill-appearing.  HENT:     Head: Normocephalic and atraumatic.     Right Ear: External ear normal.     Left Ear: External ear normal.     Nose: Nose normal.     Mouth/Throat:     Mouth: Mucous membranes are moist.     Pharynx: Oropharynx is clear.  Eyes:     Extraocular Movements: Extraocular movements intact.     Conjunctiva/sclera: Conjunctivae normal.  Cardiovascular:     Rate and Rhythm: Normal rate.     Pulses: Normal pulses.  Pulmonary:     Effort: Pulmonary effort is normal.  Abdominal:     General: Bowel sounds are normal.     Palpations: Abdomen is soft.  Musculoskeletal:        General: Tenderness present.     Cervical back: Normal range of motion and neck supple.     Comments: TTP over groin, lateral aspect, greater trochanter.  Mild IT band tenderness.  No significant swelling.  Adequately healed incision without evidence of infection, otherwise no overlying lesions of area of  chief complaint.  Decreased strength and ROM due to elicited pain.  Dorsiflexion and plantarflexion intact.  BLE appear grossly neurovascularly intact.  Gait antalgic with walker.   Skin:    General: Skin is warm and dry.  Neurological:     Mental Status: She is alert and oriented to person, place, and time. Mental status is at baseline.  Psychiatric:        Mood and Affect: Mood normal.        Behavior: Behavior normal.     Vital signs in last 24 hours: @VSRANGES @   Labs:   Estimated body mass index is 25.87 kg/m as calculated from the following:   Height as of 05/24/24: 5' 4 (1.626 m).   Weight as of 05/24/24: 68.4 kg.  Imaging Review:  Plain radiographs demonstrate X-rays and CT reviewed in [facility] as well as X-rays obtained today in clinic demonstrate severe erosive changes around the proximal femur and intertrochanteric region. There is superior migration of this medullary screw, now nearly penetrating the femoral head.  There is a  broken interlock screw distally.  The bone quality appears to be poor for age and reported activity level.     Assessment/Plan:  Left femur chronic non-union, left hip(s) with failed open reduction internal fixation -- conversion to total hip arthroplasty (proximal femur replacement)  The patient history, physical examination, clinical judgement of the provider and imaging studies are consistent with chronic nonunion of the left hip(s), previous open reduction internal fixation x 2. Conversion total hip arthroplasty is deemed medically necessary. The treatment options including medical management, injection therapy, arthroscopy and arthroplasty were discussed at length. The risks and benefits of total hip arthroplasty were presented and reviewed. The risks due to aseptic loosening, infection, stiffness, dislocation/subluxation,  thromboembolic complications and other imponderables were discussed.  The patient acknowledged the explanation, agreed to  proceed with the plan and consent was signed. Patient is being admitted for inpatient treatment for surgery, pain control, PT, OT, prophylactic antibiotics, VTE prophylaxis, progressive ambulation and ADL's and discharge planning. The patient is planning to be discharged home with home health  services ([facility]).

## 2024-06-21 NOTE — H&P (Addendum)
 TOTAL HIP CONVERSION ADMISSION H&P  Patient is admitted for left conversion total hip arthroplasty.  Subjective:  Chief Complaint: left hip pain  HPI: Melanie Carrillo, 89 y.o. 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 The indications for the revision total hip arthroplasty are chronic nonunion of left femoral fracture.  Onset of symptoms was abrupt starting 1-2 years ago with gradually worsening course since that time.  Prior procedures on the left hip include IM nail placement and internal fixation.  Patient currently rates pain in the left hip at 10 out of 10 with activity.  There is night pain, worsening of pain with activity and weight bearing, trendelenberg gait, pain that interfers with activities of daily living, and pain with passive range of motion. Patient has evidence of chronic nonunion with loosening of hardware by imaging studies.  Dry tap on 05/22/24, however CRP and ESR were elevated.  This condition presents safety issues increasing the risk of falls.      Patient Active Problem List   Diagnosis Date Noted   Chronic hyponatremia 01/05/2024   Choledocholithiasis with chronic cholecystitis 04/28/2023   Localized osteoarthritis of left knee 03/22/2023   Calculus of gallbladder and bile duct with obstruction without cholecystitis 03/22/2023   Postmenopausal estrogen deficiency 11/26/2022   At risk for pill esophagitis 08/10/2022   Adjustment insomnia 07/21/2022   Other fatigue 07/21/2022   Loosening of intramedullary nail 04/27/2022   Degenerative disk disease 04/27/2022   Interstitial lung disease (HCC) 12/25/2021   Hyperlipidemia, mixed 09/07/2021   Obstructive sleep apnea 01/29/2014   GERD  (gastroesophageal reflux disease) 12/26/2012   Depression 12/26/2012   Essential hypertension 11/07/2002   Past Medical History:  Diagnosis Date   Allergy    Anxiety    Arthritis    Calculus of gallbladder and bile duct with obstruction without cholecystitis    Cataract    Choledocholithiasis    Colon cancer (HCC)    Constipation    DDD (degenerative disc disease), lumbar    Depression    Dyspnea    GERD (gastroesophageal reflux disease)    Glaucoma    Hip fracture (HCC) 12/25/2021   Hip fracture requiring operative repair (HCC) 2023   HLD (hyperlipidemia)    Hypertension    Interstitial lung disease (HCC)    Left hip postoperative wound infection    Obstructive sleep apnea 01/29/2014   No Cpap   Osteomyelitis (HCC)    Osteomyelitis (HCC) 06/24/2022   Osteoporosis    Rhabdomyolysis    Severe sepsis (HCC) 05/13/2023   Shingles     Past Surgical History:  Procedure Laterality Date   APPENDECTOMY     CARDIAC CATHETERIZATION     CHOLECYSTECTOMY     COLON SURGERY     COLONOSCOPY     ENDOSCOPIC RETROGRADE CHOLANGIOPANCREATOGRAPHY (ERCP) WITH PROPOFOL  N/A 04/28/2023   Procedure: ENDOSCOPIC RETROGRADE CHOLANGIOPANCREATOGRAPHY (ERCP) WITH PROPOFOL ;  Surgeon: Jinny Carmine, MD;  Location: [facility];  Service: Endoscopy;  Laterality: N/A;   EYE SURGERY Bilateral    cataracts   FEMUR IM NAIL Left 04/28/2022   Procedure: REVISION FIXATION OF LEFT FEMUR FRACTURE;  Surgeon: Kendal Franky SQUIBB, MD;  Location: [facility];  Service: Orthopedics;  Laterality: Left;   FRACTURE SURGERY     HARDWARE  REMOVAL Left 04/28/2022   Procedure: HARDWARE REMOVAL;  Surgeon: Kendal Franky SQUIBB, MD;  Location: [facility] OR;  Service: Orthopedics;  Laterality: Left;   HERNIA REPAIR     I & D EXTREMITY Left 06/23/2022   Procedure: IRRIGATION AND DEBRIDEMENT LEFT HIP;  Surgeon: Kendal Franky SQUIBB, MD;  Location: [facility];  Service: Orthopedics;  Laterality: Left;   I & D EXTREMITY Left 07/28/2022   Procedure: IRRIGATION  AND DEBRIDEMENT HIP;  Surgeon: Kendal Franky SQUIBB, MD;  Location: [facility];  Service: Orthopedics;  Laterality: Left;   INTRAMEDULLARY (IM) NAIL INTERTROCHANTERIC Left 12/26/2021   Procedure: INTRAMEDULLARY (IM) NAIL INTERTROCHANTRIC;  Surgeon: Rollene Cough, MD;  Location: [facility];  Service: Orthopedics;  Laterality: Left;   MULTIPLE TOOTH EXTRACTIONS     full dentures   REMOVAL OF STONES  04/28/2023   Procedure: REMOVAL OF STONES;  Surgeon: Jinny Carmine, MD;  Location: [facility];  Service: Endoscopy;;   shoulder  surgery Left    SPHINCTEROTOMY  04/28/2023   Procedure: SPHINCTEROTOMY;  Surgeon: Jinny Carmine, MD;  Location: [facility];  Service: Endoscopy;;   TONSILLECTOMY     TUBAL LIGATION     UPPER GI ENDOSCOPY     WRIST SURGERY Right     Current Outpatient Medications  Medication Sig Dispense Refill Last Dose/Taking   brimonidine  (ALPHAGAN ) 0.2 % ophthalmic solution Place 1 drop into both eyes in the morning and at bedtime.      Cholecalciferol  (VITAMIN D -3) 125 MCG (5000 UT) TABS Take 5,000 Units by mouth in the morning.      feeding supplement (ENSURE ENLIVE / ENSURE PLUS) LIQD Take 237 mLs by mouth 2 (two) times daily between meals. 237 mL 12    guaiFENesin  (MUCINEX ) 600 MG 12 hr tablet Take 600 mg by mouth at bedtime.      latanoprost  (XALATAN ) 0.005 % ophthalmic solution Place 1 drop into both eyes at bedtime.      losartan  (COZAAR ) 100 MG tablet TAKE 1 TABLET (100 MG TOTAL) BY MOUTH ONCE DAILY FOR 90 DAYS 90 tablet 1    lovastatin (MEVACOR) 40 MG tablet TAKE 1 TABLET (40 MG TOTAL) BY MOUTH DAILY WITH DINNER FOR 90 DAYS 90 tablet 3    metoprolol  tartrate (LOPRESSOR ) 25 MG tablet Take 0.5 tablets (12.5 mg total) by mouth 2 (two) times daily. 90 tablet 2    montelukast  (SINGULAIR ) 10 MG tablet TAKE 1 TABLET BY MOUTH EVERYDAY AT BEDTIME 90 tablet 3    Multiple Vitamin (MULTIVITAMIN) tablet Take 1 tablet by mouth daily.      omeprazole  (PRILOSEC) 40 MG capsule TAKE 1 CAPSULE  BY MOUTH AT BEDTIME. 90 capsule 1    sertraline  (ZOLOFT ) 50 MG tablet TAKE 1 TABLET BY MOUTH EVERY DAY 90 tablet 1    tiZANidine  (ZANAFLEX ) 4 MG tablet Take 0.5-1.5 tablets (2-6 mg total) by mouth every 8 (eight) hours as needed for muscle spasms (muscle tightness). 90 tablet 2    traZODone  (DESYREL ) 50 MG tablet Take 1 tablet (50 mg total) by mouth at bedtime. 90 tablet 1    No current facility-administered medications for this visit.   No Known Allergies  Social History   Tobacco Use   Smoking status: Never    Passive exposure: Never   Smokeless tobacco: Never  Substance Use Topics   Alcohol use: Not Currently    Family History  Problem Relation Age of Onset   Hypertension Mother    Heart disease Mother    Heart disease  Father       Review of Systems  Musculoskeletal:  Positive for arthralgias.  All other systems reviewed and are negative.   Objective:  Physical Exam Constitutional:      General: She is not in acute distress.    Appearance: Normal appearance. She is not ill-appearing.  HENT:     Head: Normocephalic and atraumatic.     Right Ear: External ear normal.     Left Ear: External ear normal.     Nose: Nose normal.     Mouth/Throat:     Mouth: Mucous membranes are moist.     Pharynx: Oropharynx is clear.  Eyes:     Extraocular Movements: Extraocular movements intact.     Conjunctiva/sclera: Conjunctivae normal.  Cardiovascular:     Rate and Rhythm: Normal rate.     Pulses: Normal pulses.  Pulmonary:     Effort: Pulmonary effort is normal.  Abdominal:     General: Bowel sounds are normal.     Palpations: Abdomen is soft.  Musculoskeletal:        General: Tenderness present.     Cervical back: Normal range of motion and neck supple.     Comments: TTP over groin, lateral aspect, greater trochanter.  Mild IT band tenderness.  No significant swelling.  Adequately healed incision without evidence of infection, otherwise no overlying lesions of area of  chief complaint.  Decreased strength and ROM due to elicited pain.  Dorsiflexion and plantarflexion intact.  BLE appear grossly neurovascularly intact.  Gait antalgic with walker.   Skin:    General: Skin is warm and dry.  Neurological:     Mental Status: She is alert and oriented to person, place, and time. Mental status is at baseline.  Psychiatric:        Mood and Affect: Mood normal.        Behavior: Behavior normal.     Vital signs in last 24 hours: @VSRANGES @   Labs:   Estimated body mass index is 25.87 kg/m as calculated from the following:   Height as of 05/24/24: 5' 4 (1.626 m).   Weight as of 05/24/24: 68.4 kg.  Imaging Review:  Plain radiographs demonstrate X-rays and CT reviewed in [facility] as well as X-rays obtained today in clinic demonstrate severe erosive changes around the proximal femur and intertrochanteric region. There is superior migration of this medullary screw, now nearly penetrating the femoral head.  There is a  broken interlock screw distally.  The bone quality appears to be poor for age and reported activity level.     Assessment/Plan:  Left femur chronic non-union, left hip(s) with failed open reduction internal fixation -- conversion to total hip arthroplasty (proximal femur replacement)  The patient history, physical examination, clinical judgement of the provider and imaging studies are consistent with chronic nonunion of the left hip(s), previous open reduction internal fixation x 2. Conversion total hip arthroplasty is deemed medically necessary. The treatment options including medical management, injection therapy, arthroscopy and arthroplasty were discussed at length. The risks and benefits of total hip arthroplasty were presented and reviewed. The risks due to aseptic loosening, infection, stiffness, dislocation/subluxation,  thromboembolic complications and other imponderables were discussed.  The patient acknowledged the explanation, agreed to  proceed with the plan and consent was signed. Patient is being admitted for inpatient treatment for surgery, pain control, PT, OT, prophylactic antibiotics, VTE prophylaxis, progressive ambulation and ADL's and discharge planning. The patient is planning to be discharged home with home health  services ([facility]).

## 2024-06-22 ENCOUNTER — Other Ambulatory Visit: Payer: Self-pay

## 2024-06-22 ENCOUNTER — Encounter (HOSPITAL_COMMUNITY)
Admission: RE | Admit: 2024-06-22 | Discharge: 2024-06-22 | Disposition: A | Discharge status: Home / Self Care | Source: Ambulatory Visit | Attending: Orthopedic Surgery | Admitting: Orthopedic Surgery

## 2024-06-22 ENCOUNTER — Encounter (HOSPITAL_COMMUNITY): Payer: Self-pay

## 2024-06-22 VITALS — BP 128/83 | HR 68 | Temp 97.6°F | Resp 18 | Ht 64.0 in | Wt 148.0 lb

## 2024-06-22 DIAGNOSIS — X58XXXD Exposure to other specified factors, subsequent encounter: Secondary | ICD-10-CM | POA: Insufficient documentation

## 2024-06-22 DIAGNOSIS — Z01818 Encounter for other preprocedural examination: Secondary | ICD-10-CM

## 2024-06-22 DIAGNOSIS — S72002K Fracture of unspecified part of neck of left femur, subsequent encounter for closed fracture with nonunion: Secondary | ICD-10-CM | POA: Diagnosis not present

## 2024-06-22 DIAGNOSIS — Z01812 Encounter for preprocedural laboratory examination: Secondary | ICD-10-CM | POA: Diagnosis present

## 2024-06-22 DIAGNOSIS — I10 Essential (primary) hypertension: Secondary | ICD-10-CM | POA: Insufficient documentation

## 2024-06-22 HISTORY — DX: Personal history of other diseases of the digestive system: Z87.19

## 2024-06-22 HISTORY — DX: Headache, unspecified: R51.9

## 2024-06-22 LAB — CBC WITH DIFFERENTIAL/PLATELET
Abs Immature Granulocytes: 0.1 K/uL — ABNORMAL HIGH (ref 0.00–0.07)
Basophils Absolute: 0.1 K/uL (ref 0.0–0.1)
Basophils Relative: 1 %
Eosinophils Absolute: 0.2 K/uL (ref 0.0–0.5)
Eosinophils Relative: 2 %
HCT: 37.7 % (ref 36.0–46.0)
Hemoglobin: 11.6 g/dL — ABNORMAL LOW (ref 12.0–15.0)
Immature Granulocytes: 1 %
Lymphocytes Relative: 32 %
Lymphs Abs: 2.7 K/uL (ref 0.7–4.0)
MCH: 33.8 pg (ref 26.0–34.0)
MCHC: 30.8 g/dL (ref 30.0–36.0)
MCV: 109.9 fL — ABNORMAL HIGH (ref 80.0–100.0)
Monocytes Absolute: 0.6 K/uL (ref 0.1–1.0)
Monocytes Relative: 8 %
Neutro Abs: 4.7 K/uL (ref 1.7–7.7)
Neutrophils Relative %: 56 %
Platelets: 293 K/uL (ref 150–400)
RBC: 3.43 MIL/uL — ABNORMAL LOW (ref 3.87–5.11)
RDW: 14.6 % (ref 11.5–15.5)
WBC: 8.3 K/uL (ref 4.0–10.5)
nRBC: 0 % (ref 0.0–0.2)

## 2024-06-22 LAB — COMPREHENSIVE METABOLIC PANEL WITH GFR
ALT: 8 U/L (ref 0–44)
AST: 24 U/L (ref 15–41)
Albumin: 4.1 g/dL (ref 3.5–5.0)
Alkaline Phosphatase: 67 U/L (ref 38–126)
Anion gap: 9 (ref 5–15)
BUN: 17 mg/dL (ref 8–23)
CO2: 23 mmol/L (ref 22–32)
Calcium: 10.4 mg/dL — ABNORMAL HIGH (ref 8.9–10.3)
Chloride: 103 mmol/L (ref 98–111)
Creatinine, Ser: 0.69 mg/dL (ref 0.44–1.00)
GFR, Estimated: >60 mL/min (ref >60)
Glucose, Bld: 92 mg/dL (ref 70–99)
Potassium: 4.9 mmol/L (ref 3.5–5.1)
Sodium: 135 mmol/L (ref 135–145)
Total Bilirubin: 0.4 mg/dL (ref 0.0–1.2)
Total Protein: 7.8 g/dL (ref 6.5–8.1)

## 2024-06-22 LAB — SURGICAL PCR SCREEN
MRSA, PCR: NEGATIVE
Staphylococcus aureus: POSITIVE — AB

## 2024-06-25 NOTE — Progress Notes (Signed)
 Request sent to Dr. Edna to view pt's pre op PCR from 06/22/24.

## 2024-06-30 NOTE — Anesthesia Preprocedure Evaluation (Signed)
 Anesthesia Evaluation  Patient identified by MRN, date of birth, ID band Patient awake    Reviewed: Allergy & Precautions, NPO status , Patient's Chart, lab work & pertinent test results  History of Anesthesia Complications Negative for: history of anesthetic complications  Airway Mallampati: I  TM Distance: >3 FB Neck ROM: Full    Dental no notable dental hx. (+) Edentulous Upper, Edentulous Lower   Pulmonary shortness of breath, sleep apnea    Pulmonary exam normal breath sounds clear to auscultation       Cardiovascular hypertension, (-) angina (-) Past MI Normal cardiovascular exam Rhythm:Regular Rate:Normal     Neuro/Psych  PSYCHIATRIC DISORDERS Anxiety Depression     Neuromuscular disease    GI/Hepatic hiatal hernia,GERD  ,,  Endo/Other  negative endocrine ROS    Renal/GU Lab Results      Component                Value               Date                              K                        4.9                 06/22/2024                CO2                      23                  06/22/2024                BUN                      17                  06/22/2024                CREATININE               0.69                06/22/2024                GFRNONAA                 >60                 06/22/2024                       Musculoskeletal  (+) Arthritis , Osteoarthritis,    Abdominal   Peds  Hematology Lab Results      Component                Value               Date                      WBC                      8.3                 06/22/2024  HGB                      11.6 (L)            06/22/2024                HCT                      37.7                06/22/2024                MCV                      109.9 (H)           06/22/2024                PLT                      293                 06/22/2024              Anesthesia Other Findings NKDA  Reproductive/Obstetrics                               Anesthesia Physical Anesthesia Plan  ASA: 3  Anesthesia Plan: Spinal   Post-op Pain Management: Ofirmev  IV (intra-op)*   Induction: Intravenous  PONV Risk Score and Plan: Treatment may vary due to age or medical condition, Ondansetron  and Propofol  infusion  Airway Management Planned: Natural Airway and Nasal Cannula  Additional Equipment: None  Intra-op Plan:   Post-operative Plan:   Informed Consent: I have reviewed the patients History and Physical, chart, labs and discussed the procedure including the risks, benefits and alternatives for the proposed anesthesia with the patient or authorized representative who has indicated his/her understanding and acceptance.     Dental advisory given  Plan Discussed with: CRNA and Surgeon  Anesthesia Plan Comments:          Anesthesia Quick Evaluation

## 2024-07-02 ENCOUNTER — Inpatient Hospital Stay (HOSPITAL_COMMUNITY)

## 2024-07-02 ENCOUNTER — Inpatient Hospital Stay (HOSPITAL_COMMUNITY): Payer: Self-pay | Admitting: Anesthesiology

## 2024-07-02 ENCOUNTER — Inpatient Hospital Stay (HOSPITAL_COMMUNITY)
Admission: RE | Admit: 2024-07-02 | Discharge: 2024-07-09 | DRG: 498 | Disposition: A | Discharge status: Home / Self Care | Source: Ambulatory Visit | Attending: Orthopedic Surgery | Admitting: Orthopedic Surgery

## 2024-07-02 ENCOUNTER — Encounter (HOSPITAL_COMMUNITY)
Admission: RE | Disposition: A | Discharge status: Home / Self Care | Payer: Self-pay | Source: Ambulatory Visit | Attending: Orthopedic Surgery

## 2024-07-02 ENCOUNTER — Inpatient Hospital Stay (HOSPITAL_COMMUNITY): Payer: Self-pay | Admitting: Physician Assistant

## 2024-07-02 ENCOUNTER — Encounter (HOSPITAL_COMMUNITY): Payer: Self-pay | Admitting: Orthopedic Surgery

## 2024-07-02 ENCOUNTER — Other Ambulatory Visit: Payer: Self-pay

## 2024-07-02 DIAGNOSIS — M51369 Other intervertebral disc degeneration, lumbar region without mention of lumbar back pain or lower extremity pain: Secondary | ICD-10-CM | POA: Diagnosis not present

## 2024-07-02 DIAGNOSIS — I959 Hypotension, unspecified: Secondary | ICD-10-CM | POA: Diagnosis not present

## 2024-07-02 DIAGNOSIS — M1990 Unspecified osteoarthritis, unspecified site: Secondary | ICD-10-CM | POA: Diagnosis not present

## 2024-07-02 DIAGNOSIS — Z7982 Long term (current) use of aspirin: Secondary | ICD-10-CM

## 2024-07-02 DIAGNOSIS — F418 Other specified anxiety disorders: Secondary | ICD-10-CM

## 2024-07-02 DIAGNOSIS — Z9049 Acquired absence of other specified parts of digestive tract: Secondary | ICD-10-CM

## 2024-07-02 DIAGNOSIS — Z85038 Personal history of other malignant neoplasm of large intestine: Secondary | ICD-10-CM | POA: Diagnosis not present

## 2024-07-02 DIAGNOSIS — I10 Essential (primary) hypertension: Secondary | ICD-10-CM | POA: Diagnosis present

## 2024-07-02 DIAGNOSIS — Z79899 Other long term (current) drug therapy: Secondary | ICD-10-CM | POA: Diagnosis not present

## 2024-07-02 DIAGNOSIS — Z96642 Presence of left artificial hip joint: Secondary | ICD-10-CM | POA: Diagnosis not present

## 2024-07-02 DIAGNOSIS — H409 Unspecified glaucoma: Secondary | ICD-10-CM | POA: Diagnosis present

## 2024-07-02 DIAGNOSIS — M542 Cervicalgia: Secondary | ICD-10-CM

## 2024-07-02 DIAGNOSIS — S72142K Displaced intertrochanteric fracture of left femur, subsequent encounter for closed fracture with nonunion: Principal | ICD-10-CM

## 2024-07-02 DIAGNOSIS — M810 Age-related osteoporosis without current pathological fracture: Secondary | ICD-10-CM | POA: Diagnosis present

## 2024-07-02 DIAGNOSIS — Z471 Aftercare following joint replacement surgery: Secondary | ICD-10-CM | POA: Diagnosis not present

## 2024-07-02 DIAGNOSIS — T8452XD Infection and inflammatory reaction due to internal left hip prosthesis, subsequent encounter: Secondary | ICD-10-CM | POA: Diagnosis not present

## 2024-07-02 DIAGNOSIS — M6281 Muscle weakness (generalized): Secondary | ICD-10-CM | POA: Diagnosis not present

## 2024-07-02 DIAGNOSIS — Z9181 History of falling: Secondary | ICD-10-CM

## 2024-07-02 DIAGNOSIS — Z7401 Bed confinement status: Secondary | ICD-10-CM | POA: Diagnosis not present

## 2024-07-02 DIAGNOSIS — G4733 Obstructive sleep apnea (adult) (pediatric): Secondary | ICD-10-CM | POA: Diagnosis present

## 2024-07-02 DIAGNOSIS — T8452XA Infection and inflammatory reaction due to internal left hip prosthesis, initial encounter: Secondary | ICD-10-CM

## 2024-07-02 DIAGNOSIS — S7292XK Unspecified fracture of left femur, subsequent encounter for closed fracture with nonunion: Secondary | ICD-10-CM | POA: Diagnosis not present

## 2024-07-02 DIAGNOSIS — D62 Acute posthemorrhagic anemia: Secondary | ICD-10-CM | POA: Diagnosis not present

## 2024-07-02 DIAGNOSIS — Z472 Encounter for removal of internal fixation device: Secondary | ICD-10-CM | POA: Diagnosis not present

## 2024-07-02 DIAGNOSIS — Z8249 Family history of ischemic heart disease and other diseases of the circulatory system: Secondary | ICD-10-CM

## 2024-07-02 DIAGNOSIS — E782 Mixed hyperlipidemia: Secondary | ICD-10-CM | POA: Diagnosis not present

## 2024-07-02 DIAGNOSIS — Z01818 Encounter for other preprocedural examination: Secondary | ICD-10-CM

## 2024-07-02 DIAGNOSIS — S7292XD Unspecified fracture of left femur, subsequent encounter for closed fracture with routine healing: Secondary | ICD-10-CM | POA: Diagnosis not present

## 2024-07-02 DIAGNOSIS — M6259 Muscle wasting and atrophy, not elsewhere classified, multiple sites: Secondary | ICD-10-CM | POA: Diagnosis not present

## 2024-07-02 HISTORY — PX: CONVERSION TO TOTAL HIP: SHX5784

## 2024-07-02 LAB — TYPE AND SCREEN
ABO/RH(D): O POS
Antibody Screen: NEGATIVE

## 2024-07-02 SURGERY — CONVERSION, PREVIOUS HIP SURGERY, TO TOTAL HIP ARTHROPLASTY
Anesthesia: Spinal | Site: Hip | Laterality: Left

## 2024-07-02 MED ORDER — ONDANSETRON HCL 4 MG/2ML IJ SOLN: 4.0000 mg | Freq: Four times a day (QID) | INTRAMUSCULAR | Status: DC | PRN

## 2024-07-02 MED ORDER — METOPROLOL TARTRATE 25 MG PO TABS
12.5000 mg | ORAL_TABLET | Freq: Once | ORAL | Status: AC
  Administered 2024-07-02: 12.5 mg via ORAL
  Filled 2024-07-02: qty 1

## 2024-07-02 MED ORDER — BUPIVACAINE-EPINEPHRINE (PF) 0.25% -1:200000 IJ SOLN
INTRAMUSCULAR | Status: AC
  Filled 2024-07-02: qty 30

## 2024-07-02 MED ORDER — KETOROLAC TROMETHAMINE 15 MG/ML IJ SOLN
7.5000 mg | Freq: Four times a day (QID) | INTRAMUSCULAR | Status: AC
  Administered 2024-07-02 – 2024-07-03 (×4): 7.5 mg via INTRAVENOUS
  Filled 2024-07-02 (×4): qty 1

## 2024-07-02 MED ORDER — ONDANSETRON HCL 4 MG PO TABS: 4.0000 mg | ORAL_TABLET | Freq: Four times a day (QID) | ORAL | Status: DC | PRN

## 2024-07-02 MED ORDER — PHENYLEPHRINE 80 MCG/ML (10ML) SYRINGE FOR IV PUSH (FOR BLOOD PRESSURE SUPPORT)
PREFILLED_SYRINGE | INTRAVENOUS | Status: DC | PRN
  Administered 2024-07-02 (×3): 160 ug via INTRAVENOUS

## 2024-07-02 MED ORDER — ASPIRIN 81 MG PO CHEW
81.0000 mg | CHEWABLE_TABLET | Freq: Two times a day (BID) | ORAL | Status: DC
  Administered 2024-07-03 – 2024-07-09 (×13): 81 mg via ORAL
  Filled 2024-07-02 (×13): qty 1

## 2024-07-02 MED ORDER — CHLORHEXIDINE GLUCONATE 0.12 % MT SOLN
15.0000 mL | Freq: Once | OROMUCOSAL | Status: AC
  Administered 2024-07-02: 15 mL via OROMUCOSAL

## 2024-07-02 MED ORDER — METHOCARBAMOL 500 MG PO TABS
500.0000 mg | ORAL_TABLET | Freq: Four times a day (QID) | ORAL | Status: DC | PRN
Start: 2024-07-02 — End: 2024-07-09
  Administered 2024-07-02 – 2024-07-09 (×13): 500 mg via ORAL
  Filled 2024-07-02 (×13): qty 1

## 2024-07-02 MED ORDER — SODIUM CHLORIDE 0.9 % IR SOLN
Status: DC | PRN
  Administered 2024-07-02: 3000 mL

## 2024-07-02 MED ORDER — WATER FOR IRRIGATION, STERILE IR SOLN
Status: DC | PRN
  Administered 2024-07-02: 2000 mL

## 2024-07-02 MED ORDER — BUPIVACAINE HCL (PF) 0.5 % IJ SOLN
INTRAMUSCULAR | Status: DC | PRN
  Administered 2024-07-02: 13 mg via INTRATHECAL

## 2024-07-02 MED ORDER — FENTANYL CITRATE (PF) 50 MCG/ML IJ SOSY
25.0000 ug | PREFILLED_SYRINGE | Freq: Once | INTRAMUSCULAR | Status: AC | PRN
  Administered 2024-07-02: 50 ug via INTRAVENOUS

## 2024-07-02 MED ORDER — BUPIVACAINE LIPOSOME 1.3 % IJ SUSP
INTRAMUSCULAR | Status: AC
  Filled 2024-07-02: qty 20

## 2024-07-02 MED ORDER — ACETAMINOPHEN 325 MG PO TABS
325.0000 mg | ORAL_TABLET | Freq: Four times a day (QID) | ORAL | Status: DC | PRN
  Administered 2024-07-05: 650 mg via ORAL
  Filled 2024-07-02 (×2): qty 2

## 2024-07-02 MED ORDER — POLYETHYLENE GLYCOL 3350 17 G PO PACK: 17.0000 g | PACK | Freq: Every day | ORAL | Status: DC | PRN

## 2024-07-02 MED ORDER — CEFADROXIL 500 MG PO CAPS: 500.0000 mg | ORAL_CAPSULE | Freq: Two times a day (BID) | ORAL | 0 refills | Status: DC

## 2024-07-02 MED ORDER — METHOCARBAMOL 1000 MG/10ML IJ SOLN: 500.0000 mg | Freq: Four times a day (QID) | INTRAMUSCULAR | Status: DC | PRN

## 2024-07-02 MED ORDER — CEFAZOLIN SODIUM-DEXTROSE 2-4 GM/100ML-% IV SOLN
2.0000 g | Freq: Three times a day (TID) | INTRAVENOUS | Status: DC
  Administered 2024-07-02 – 2024-07-05 (×8): 2 g via INTRAVENOUS
  Filled 2024-07-02 (×8): qty 100

## 2024-07-02 MED ORDER — FENTANYL CITRATE (PF) 100 MCG/2ML IJ SOLN
INTRAMUSCULAR | Status: AC
  Filled 2024-07-02: qty 2

## 2024-07-02 MED ORDER — ACETAMINOPHEN 500 MG PO TABS
1000.0000 mg | ORAL_TABLET | Freq: Four times a day (QID) | ORAL | Status: AC
  Administered 2024-07-03 (×4): 1000 mg via ORAL
  Filled 2024-07-02 (×4): qty 2

## 2024-07-02 MED ORDER — PRAVASTATIN SODIUM 20 MG PO TABS
40.0000 mg | ORAL_TABLET | Freq: Every day | ORAL | Status: DC
  Administered 2024-07-03 – 2024-07-08 (×6): 40 mg via ORAL
  Filled 2024-07-02 (×6): qty 2

## 2024-07-02 MED ORDER — DOCUSATE SODIUM 100 MG PO CAPS
100.0000 mg | ORAL_CAPSULE | Freq: Two times a day (BID) | ORAL | Status: DC
  Administered 2024-07-02 – 2024-07-09 (×14): 100 mg via ORAL
  Filled 2024-07-02 (×14): qty 1

## 2024-07-02 MED ORDER — TRANEXAMIC ACID-NACL 1000-0.7 MG/100ML-% IV SOLN
1000.0000 mg | INTRAVENOUS | Status: AC
  Administered 2024-07-02: 1000 mg via INTRAVENOUS
  Filled 2024-07-02: qty 100

## 2024-07-02 MED ORDER — ONDANSETRON HCL 4 MG/2ML IJ SOLN: 4.0000 mg | Freq: Once | INTRAMUSCULAR | Status: DC | PRN

## 2024-07-02 MED ORDER — LATANOPROST 0.005 % OP SOLN
1.0000 [drp] | Freq: Every day | OPHTHALMIC | Status: DC
Start: 2024-07-02 — End: 2024-07-09
  Administered 2024-07-03 – 2024-07-08 (×6): 1 [drp] via OPHTHALMIC
  Filled 2024-07-02: qty 2.5

## 2024-07-02 MED ORDER — MENTHOL 3 MG MT LOZG: 1.0000 | LOZENGE | OROMUCOSAL | Status: DC | PRN

## 2024-07-02 MED ORDER — PROPOFOL 10 MG/ML IV BOLUS
INTRAVENOUS | Status: AC
  Filled 2024-07-02: qty 20

## 2024-07-02 MED ORDER — ENSURE ENLIVE PO LIQD
237.0000 mL | Freq: Two times a day (BID) | ORAL | Status: DC
  Administered 2024-07-03 – 2024-07-09 (×12): 237 mL via ORAL
  Filled 2024-07-02 (×14): qty 237

## 2024-07-02 MED ORDER — PANTOPRAZOLE SODIUM 40 MG PO TBEC
40.0000 mg | DELAYED_RELEASE_TABLET | Freq: Every day | ORAL | Status: DC
  Administered 2024-07-02 – 2024-07-09 (×8): 40 mg via ORAL
  Filled 2024-07-02 (×8): qty 1

## 2024-07-02 MED ORDER — SODIUM CHLORIDE (PF) 0.9 % IJ SOLN
INTRAMUSCULAR | Status: AC
  Filled 2024-07-02: qty 30

## 2024-07-02 MED ORDER — CEFAZOLIN SODIUM-DEXTROSE 2-4 GM/100ML-% IV SOLN
2.0000 g | INTRAVENOUS | Status: AC
  Administered 2024-07-02: 2 g via INTRAVENOUS
  Filled 2024-07-02: qty 100

## 2024-07-02 MED ORDER — SODIUM CHLORIDE 0.9 % IV SOLN: INTRAVENOUS | Status: DC

## 2024-07-02 MED ORDER — POVIDONE-IODINE 10 % EX SWAB
2.0000 | Freq: Once | CUTANEOUS | Status: AC
  Administered 2024-07-02: 2 via TOPICAL

## 2024-07-02 MED ORDER — ACETAMINOPHEN 500 MG PO TABS
1000.0000 mg | ORAL_TABLET | Freq: Once | ORAL | Status: AC
  Administered 2024-07-02: 1000 mg via ORAL
  Filled 2024-07-02: qty 2

## 2024-07-02 MED ORDER — MONTELUKAST SODIUM 10 MG PO TABS
10.0000 mg | ORAL_TABLET | Freq: Every day | ORAL | Status: DC
  Administered 2024-07-02 – 2024-07-08 (×7): 10 mg via ORAL
  Filled 2024-07-02 (×7): qty 1

## 2024-07-02 MED ORDER — OXYCODONE HCL 5 MG PO TABS
5.0000 mg | ORAL_TABLET | ORAL | Status: DC | PRN
  Administered 2024-07-02 – 2024-07-03 (×2): 10 mg via ORAL
  Administered 2024-07-03 (×2): 5 mg via ORAL
  Administered 2024-07-03 – 2024-07-09 (×16): 10 mg via ORAL
  Filled 2024-07-02 (×16): qty 2
  Filled 2024-07-02: qty 1
  Filled 2024-07-02 (×2): qty 2
  Filled 2024-07-02: qty 1

## 2024-07-02 MED ORDER — LOSARTAN POTASSIUM 50 MG PO TABS
100.0000 mg | ORAL_TABLET | Freq: Every day | ORAL | Status: DC
  Administered 2024-07-03 – 2024-07-09 (×7): 100 mg via ORAL
  Filled 2024-07-02 (×7): qty 2

## 2024-07-02 MED ORDER — ONDANSETRON HCL 4 MG/2ML IJ SOLN
INTRAMUSCULAR | Status: DC | PRN
  Administered 2024-07-02: 4 mg via INTRAVENOUS

## 2024-07-02 MED ORDER — ACETAMINOPHEN 500 MG PO TABS: 1000.0000 mg | ORAL_TABLET | Freq: Three times a day (TID) | ORAL | Status: DC | PRN

## 2024-07-02 MED ORDER — OXYCODONE HCL 5 MG PO TABS: 5.0000 mg | ORAL_TABLET | ORAL | 0 refills | Status: DC | PRN

## 2024-07-02 MED ORDER — PHENYLEPHRINE HCL-NACL 20-0.9 MG/250ML-% IV SOLN
INTRAVENOUS | Status: DC | PRN
  Administered 2024-07-02: 50 ug/min via INTRAVENOUS

## 2024-07-02 MED ORDER — ALBUMIN HUMAN 5 % IV SOLN: INTRAVENOUS | Status: DC | PRN

## 2024-07-02 MED ORDER — 0.9 % SODIUM CHLORIDE (POUR BTL) OPTIME
TOPICAL | Status: DC | PRN
  Administered 2024-07-02: 1000 mL

## 2024-07-02 MED ORDER — SODIUM CHLORIDE (PF) 0.9 % IJ SOLN
INTRAMUSCULAR | Status: DC | PRN
  Administered 2024-07-02: 80 mL

## 2024-07-02 MED ORDER — BRIMONIDINE TARTRATE 0.2 % OP SOLN
1.0000 [drp] | Freq: Two times a day (BID) | OPHTHALMIC | Status: DC
  Administered 2024-07-03 – 2024-07-05 (×5): 1 [drp] via OPHTHALMIC
  Filled 2024-07-02: qty 5

## 2024-07-02 MED ORDER — LACTATED RINGERS IV SOLN: INTRAVENOUS | Status: DC

## 2024-07-02 MED ORDER — FENTANYL CITRATE (PF) 250 MCG/5ML IJ SOLN
INTRAMUSCULAR | Status: DC | PRN
  Administered 2024-07-02 (×2): 25 ug via INTRAVENOUS
  Administered 2024-07-02: 50 ug via INTRAVENOUS

## 2024-07-02 MED ORDER — CELECOXIB 100 MG PO CAPS: 100.0000 mg | ORAL_CAPSULE | Freq: Two times a day (BID) | ORAL | 0 refills | Status: DC

## 2024-07-02 MED ORDER — METOPROLOL TARTRATE 12.5 MG HALF TABLET
12.5000 mg | ORAL_TABLET | Freq: Two times a day (BID) | ORAL | Status: DC
  Administered 2024-07-02 – 2024-07-09 (×14): 12.5 mg via ORAL
  Filled 2024-07-02 (×14): qty 1

## 2024-07-02 MED ORDER — CEFADROXIL 500 MG PO CAPS: 500.0000 mg | ORAL_CAPSULE | Freq: Two times a day (BID) | ORAL | Status: DC

## 2024-07-02 MED ORDER — ASPIRIN 81 MG PO TBEC: 81.0000 mg | DELAYED_RELEASE_TABLET | Freq: Two times a day (BID) | ORAL | Status: DC

## 2024-07-02 MED ORDER — TIZANIDINE HCL 4 MG PO TABS
2.0000 mg | ORAL_TABLET | Freq: Three times a day (TID) | ORAL | Status: DC | PRN
  Administered 2024-07-03: 4 mg via ORAL
  Administered 2024-07-03: 6 mg via ORAL
  Filled 2024-07-02: qty 2
  Filled 2024-07-02: qty 1

## 2024-07-02 MED ORDER — DIPHENHYDRAMINE HCL 12.5 MG/5ML PO ELIX: 12.5000 mg | ORAL_SOLUTION | ORAL | Status: DC | PRN

## 2024-07-02 MED ORDER — ISOPROPYL ALCOHOL 70 % SOLN
Status: DC | PRN
  Administered 2024-07-02: 1 via TOPICAL

## 2024-07-02 MED ORDER — POLYETHYLENE GLYCOL 3350 17 G PO PACK: 17.0000 g | PACK | Freq: Every day | ORAL | 0 refills | Status: DC

## 2024-07-02 MED ORDER — SERTRALINE HCL 50 MG PO TABS
50.0000 mg | ORAL_TABLET | Freq: Every day | ORAL | Status: DC
  Administered 2024-07-03 – 2024-07-09 (×7): 50 mg via ORAL
  Filled 2024-07-02 (×7): qty 1

## 2024-07-02 MED ORDER — FENTANYL CITRATE (PF) 50 MCG/ML IJ SOSY
PREFILLED_SYRINGE | INTRAMUSCULAR | Status: AC
  Filled 2024-07-02: qty 1

## 2024-07-02 MED ORDER — ACETAMINOPHEN 10 MG/ML IV SOLN
1000.0000 mg | Freq: Once | INTRAVENOUS | Status: DC | PRN
  Administered 2024-07-02: 1000 mg via INTRAVENOUS

## 2024-07-02 MED ORDER — PROPOFOL 500 MG/50ML IV EMUL
INTRAVENOUS | Status: DC | PRN
  Administered 2024-07-02: 80 ug/kg/min via INTRAVENOUS

## 2024-07-02 MED ORDER — SENNA 8.6 MG PO TABS
1.0000 | ORAL_TABLET | Freq: Two times a day (BID) | ORAL | Status: DC
  Administered 2024-07-02 – 2024-07-09 (×14): 8.6 mg via ORAL
  Filled 2024-07-02 (×14): qty 1

## 2024-07-02 MED ORDER — DEXAMETHASONE SOD PHOSPHATE PF 10 MG/ML IJ SOLN: 8.0000 mg | Freq: Once | INTRAMUSCULAR | Status: DC

## 2024-07-02 MED ORDER — MUPIROCIN 2 % EX OINT: 1.0000 | TOPICAL_OINTMENT | Freq: Two times a day (BID) | CUTANEOUS | 0 refills | Status: DC

## 2024-07-02 MED ORDER — HYDROMORPHONE HCL 1 MG/ML IJ SOLN
0.5000 mg | INTRAMUSCULAR | Status: DC | PRN
  Administered 2024-07-02: 0.5 mg via INTRAVENOUS
  Filled 2024-07-02: qty 1

## 2024-07-02 MED ORDER — BUPIVACAINE LIPOSOME 1.3 % IJ SUSP: 10.0000 mL | Freq: Once | INTRAMUSCULAR | Status: DC

## 2024-07-02 MED ORDER — PHENOL 1.4 % MT LIQD: 1.0000 | OROMUCOSAL | Status: DC | PRN

## 2024-07-02 MED ORDER — ONDANSETRON HCL 4 MG PO TABS: 4.0000 mg | ORAL_TABLET | Freq: Three times a day (TID) | ORAL | 0 refills | Status: DC | PRN

## 2024-07-02 MED ORDER — ACETAMINOPHEN 10 MG/ML IV SOLN
INTRAVENOUS | Status: AC
  Filled 2024-07-02: qty 100

## 2024-07-02 MED ORDER — CHLORHEXIDINE GLUCONATE 4 % EX SOLN: 1.0000 | CUTANEOUS | 1 refills | Status: DC

## 2024-07-02 MED ORDER — ORAL CARE MOUTH RINSE: 15.0000 mL | Freq: Once | OROMUCOSAL | Status: AC

## 2024-07-02 MED ORDER — EPHEDRINE SULFATE (PRESSORS) 25 MG/5ML IV SOSY
PREFILLED_SYRINGE | INTRAVENOUS | Status: DC | PRN
  Administered 2024-07-02 (×3): 5 mg via INTRAVENOUS

## 2024-07-02 MED ORDER — TRAZODONE HCL 50 MG PO TABS
50.0000 mg | ORAL_TABLET | Freq: Every day | ORAL | Status: DC
  Administered 2024-07-02 – 2024-07-08 (×7): 50 mg via ORAL
  Filled 2024-07-02 (×7): qty 1

## 2024-07-02 MED ORDER — FENTANYL CITRATE (PF) 50 MCG/ML IJ SOSY
25.0000 ug | PREFILLED_SYRINGE | INTRAMUSCULAR | Status: DC | PRN
  Administered 2024-07-02 (×2): 50 ug via INTRAVENOUS

## 2024-07-02 MED ORDER — TIZANIDINE HCL 4 MG PO TABS: 2.0000 mg | ORAL_TABLET | Freq: Three times a day (TID) | ORAL | 0 refills | Status: AC | PRN

## 2024-07-02 MED ORDER — FENTANYL CITRATE (PF) 50 MCG/ML IJ SOSY
PREFILLED_SYRINGE | INTRAMUSCULAR | Status: AC
  Filled 2024-07-02: qty 2

## 2024-07-02 SURGICAL SUPPLY — 79 items
BAG COUNTER SPONGE SURGICOUNT (BAG) IMPLANT
BAG ZIPLOCK 12X15 (MISCELLANEOUS) ×1 IMPLANT
BIT DRILL TRIDENT 4X25 SU (BIT) IMPLANT
BLADE SAW SAG 25X90X1.19 (BLADE) IMPLANT
BLADE SAW SGTL 81X20 HD (BLADE) IMPLANT
BUR OVAL CARBIDE 4.0 (BURR) IMPLANT
CANISTER WOUND CARE 500ML ATS (WOUND CARE) IMPLANT
CEMENT BONE SIMPLEX SPEEDSET (Cement) IMPLANT
CEMENT RESTRICTOR BONE PREP ST (Cement) IMPLANT
CHLORAPREP W/TINT 26 (MISCELLANEOUS) ×2 IMPLANT
CLSR STERI-STRIP ANTIMIC 1/2X4 (GAUZE/BANDAGES/DRESSINGS) IMPLANT
CNTNR URN SCR LID CUP LEK RST (MISCELLANEOUS) ×4 IMPLANT
COMP FEM HIP STD CMT 15D LT (Joint) IMPLANT
COVER SURGICAL LIGHT HANDLE (MISCELLANEOUS) ×1 IMPLANT
DERMABOND ADVANCED .7 DNX12 (GAUZE/BANDAGES/DRESSINGS) IMPLANT
DRAPE C-ARM 42X120 X-RAY (DRAPES) IMPLANT
DRAPE C-ARMOR (DRAPES) IMPLANT
DRAPE HIP W/POCKET STRL (MISCELLANEOUS) ×1 IMPLANT
DRAPE INCISE IOBAN 85X60 (DRAPES) ×1 IMPLANT
DRAPE POUCH INSTRU U-SHP 10X18 (DRAPES) ×1 IMPLANT
DRAPE SHEET LG 3/4 BI-LAMINATE (DRAPES) ×3 IMPLANT
DRAPE U-SHAPE 47X51 STRL (DRAPES) ×2 IMPLANT
DRESSING MEPILEX FLEX 4X4 (GAUZE/BANDAGES/DRESSINGS) IMPLANT
DRSG AQUACEL AG ADV 3.5X10 (GAUZE/BANDAGES/DRESSINGS) IMPLANT
ELECT BLADE TIP CTD 4 INCH (ELECTRODE) ×1 IMPLANT
ELECT PT RETURN MONO 15F ADT (MISCELLANEOUS) ×1 IMPLANT
ELECT REM PT RETURN 15FT ADLT (MISCELLANEOUS) ×1 IMPLANT
EXTRACTOR INSERTER NAIL DE (ORTHOPEDIC DISPOSABLE SUPPLIES) IMPLANT
FACESHIELD WRAPAROUND OR TEAM (MASK) ×1 IMPLANT
GAUZE SPONGE 4X4 12PLY STRL (GAUZE/BANDAGES/DRESSINGS) ×1 IMPLANT
GAUZE SPONGE 4X4 12PLY STRL LF (GAUZE/BANDAGES/DRESSINGS) ×1 IMPLANT
GLOVE BIO SURGEON STRL SZ 6.5 (GLOVE) ×2 IMPLANT
GLOVE BIOGEL PI IND STRL 6.5 (GLOVE) ×1 IMPLANT
GLOVE BIOGEL PI IND STRL 8 (GLOVE) ×1 IMPLANT
GLOVE SURG ORTHO 8.0 STRL STRW (GLOVE) ×2 IMPLANT
GOWN STRL REUS W/ TWL XL LVL3 (GOWN DISPOSABLE) ×2 IMPLANT
HEAD HIP LFIT V40 22 (Head) IMPLANT
HOLDER FOLEY CATH W/STRAP (MISCELLANEOUS) ×1 IMPLANT
HOOD PEEL AWAY T7 (MISCELLANEOUS) ×3 IMPLANT
INSERT MDM LINER 22.2X38 38D (Insert) IMPLANT
KIT BASIN OR (CUSTOM PROCEDURE TRAY) ×1 IMPLANT
KIT DRSG PREVENA PLUS 7DAY 125 (MISCELLANEOUS) IMPLANT
KIT TURNOVER KIT A (KITS) ×1 IMPLANT
LAVAGE JET IRRISEPT WOUND (IRRIGATION / IRRIGATOR) IMPLANT
LINER CEMENTLESS SZ D 38 HIP (Liner) IMPLANT
MANIFOLD NEPTUNE II (INSTRUMENTS) ×1 IMPLANT
MARKER SKIN DUAL TIP RULER LAB (MISCELLANEOUS) ×1 IMPLANT
NDL SAFETY ECLIPSE 18X1.5 (NEEDLE) IMPLANT
NS IRRIG 1000ML POUR BTL (IV SOLUTION) ×1 IMPLANT
PACK TOTAL JOINT (CUSTOM PROCEDURE TRAY) ×1 IMPLANT
PAD ARMBOARD POSITIONER FOAM (MISCELLANEOUS) ×1 IMPLANT
PENCIL SMOKE EVACUATOR (MISCELLANEOUS) ×1 IMPLANT
PROTECTOR NERVE ULNAR (MISCELLANEOUS) ×1 IMPLANT
RETRIEVER SUT HEWSON (MISCELLANEOUS) ×1 IMPLANT
SCREW HEX LP 6.5X25 (Screw) IMPLANT
SEALER BIPOLAR AQUA 6.0 (INSTRUMENTS) IMPLANT
SET HNDPC FAN SPRY TIP SCT (DISPOSABLE) ×1 IMPLANT
SHELL TRIDENT II CLUST 50 (Shell) IMPLANT
SLEEVE CABLE 2MM VT (Orthopedic Implant) IMPLANT
SOLUTION IRRIG SURGIPHOR (IV SOLUTION) IMPLANT
SOLUTION PRONTOSAN WOUND 350ML (IRRIGATION / IRRIGATOR) IMPLANT
SPIKE FLUID TRANSFER (MISCELLANEOUS) ×3 IMPLANT
STEM FEM EXT HIP CMT 15X127 (Stem) IMPLANT
STEM FEM EXT HIP GMRS 26 70 (Stem) IMPLANT
SUCTION TUBE FRAZIER 12FR DISP (SUCTIONS) ×1 IMPLANT
SUT ETHIBOND #5 BRAIDED 30INL (SUTURE) ×1 IMPLANT
SUT ETHILON 3 0 PS 1 (SUTURE) IMPLANT
SUT STRATAFIX 14 PDO 48 VLT (SUTURE) ×1 IMPLANT
SUT VIC AB 2-0 CT2 27 (SUTURE) ×2 IMPLANT
SUTURE STRATFX 0 PDS 27 VIOLET (SUTURE) ×1 IMPLANT
SUTURE STRATFX MNCRL+ 3-0 PS-2 (SUTURE) IMPLANT
SYR 30ML LL (SYRINGE) IMPLANT
SYR 50ML LL SCALE MARK (SYRINGE) IMPLANT
TOWEL GREEN STERILE FF (TOWEL DISPOSABLE) ×1 IMPLANT
TOWEL OR DSP ST BLU DLX 10/PK (DISPOSABLE) ×1 IMPLANT
TRAY FOLEY MTR SLVR 16FR STAT (SET/KITS/TRAYS/PACK) ×1 IMPLANT
TUBE SUCTION HIGH CAP CLEAR NV (SUCTIONS) ×1 IMPLANT
UNDERPAD 30X36 HEAVY ABSORB (UNDERPADS AND DIAPERS) ×1 IMPLANT
WATER STERILE IRR 1000ML POUR (IV SOLUTION) ×2 IMPLANT

## 2024-07-02 NOTE — Anesthesia Procedure Notes (Addendum)
 Spinal  End time: 07/02/2024 2:12 PM Staffing Resident/CRNA: Cena Epps, CRNA Performed by: Cena Epps, CRNA Authorized by: Jefm Garnette LABOR, MD   Spinal Block Patient position: sitting Patient monitoring: heart rate, cardiac monitor, continuous pulse ox and blood pressure Approach: midline Location: L4-5 Injection technique: single-shot Needle Needle type: Pencan  Needle length: 10 cm Assessment Sensory level: T4 Events: CSF return

## 2024-07-02 NOTE — Interval H&P Note (Signed)
 The patient has been re-examined, and the chart reviewed, and there have been no interval changes to the documented history and physical.    Plan for removal of intramedullary nail and conversion to hip arthroplasty proximal femoral component possible hemiarthroplasty versus total hip arthroplasty  The operative side was examined and the patient was confirmed to have sensation to DPN, SPN, TN intact, Motor EHL, ext, flex 5/5, and DP 2+, PT 2+, No significant edema.   The risks, benefits, and alternatives have been discussed at length with patient, and the patient is willing to proceed.  Left hip marked. Consent has been signed.

## 2024-07-02 NOTE — Discharge Instructions (Addendum)
 INSTRUCTIONS AFTER JOINT REPLACEMENT   Remove items at home which could result in a fall. This includes throw rugs or furniture in walking pathways ICE to the affected joint every three hours while awake for 30 minutes at a time, for at least the first 3-5 days, and then as needed for pain and swelling.  Continue to use ice for pain and swelling. You may notice swelling that will progress down to the foot and ankle.  This is normal after surgery.  Elevate your leg when you are not up walking on it.   Continue to use the breathing machine you got in the hospital (incentive spirometer) which will help keep your temperature down.  It is common for your temperature to cycle up and down following surgery, especially at night when you are not up moving around and exerting yourself.  The breathing machine keeps your lungs expanded and your temperature down.  DIET:  As you were doing prior to hospitalization, we recommend a well-balanced diet.  DRESSING / WOUND CARE / SHOWERING:  Keep the surgical dressing until follow up.  Light showers okay. The dressing is water resistant, however it is best to shower with an extra covering, such as saran wrap to keep it dry.  DO NOT SUBMERGE UNDERWATER.    IF THE DRESSING FALLS OFF or the wound gets wet inside, change the dressing with sterile gauze and call our office for further instruction.  Please use good hand washing techniques before changing the dressing.  Do not use any lotions or creams on the incision until instructed by your surgeon.     ACTIVITY  Increase activity slowly as tolerated, but follow the weight bearing instructions below.   No driving for 6 weeks or until further direction given by your physician.  You cannot drive while taking narcotics.  No lifting or carrying greater than 10 lbs. until further directed by your surgeon. Avoid periods of inactivity such as sitting longer than an hour when not asleep. This helps prevent blood clots.  You may  return to work once you are authorized by your doctor.   WEIGHT BEARING: Weight bearing as tolerated with assist device AND 6 WEEKS OF POSTERIOR HIP PRECAUTIONS (walker, cane, etc) as directed, use it as long as suggested by your surgeon or therapist, typically at least 4-6 weeks.  EXERCISES  Results after joint replacement surgery are often greatly improved when you follow the exercise, range of motion and muscle strengthening exercises prescribed by your doctor. Safety measures are also important to protect the joint from further injury. Any time any of these exercises cause you to have increased pain or swelling, decrease what you are doing until you are comfortable again and then slowly increase them. If you have problems or questions, call your caregiver or physical therapist for advice.   Rehabilitation is important following a joint replacement. After just a few days of immobilization, the muscles of the leg can become weakened and shrink (atrophy).  These exercises are designed to build up the tone and strength of the thigh and leg muscles and to improve motion. Often times heat used for twenty to thirty minutes before working out will loosen up your tissues and help with improving the range of motion but do not use heat for the first two weeks following surgery (sometimes heat can increase post-operative swelling).   These exercises can be done on a training (exercise) mat, on the floor, on a table or on a bed. Use whatever works  the best and is most comfortable for you.    Use music or television while you are exercising so that the exercises are a pleasant break in your day. This will make your life better with the exercises acting as a break in your routine that you can look forward to.   Perform all exercises about fifteen times, three times per day or as directed.  You should exercise both the operative leg and the other leg as well.  Exercises include:   Quad Sets - Tighten up the muscle  on the front of the thigh (Quad) and hold for 5-10 seconds.   Straight Leg Raises - With your knee straight (if you were given a brace, keep it on), lift the leg to 60 degrees, hold for 3 seconds, and slowly lower the leg.  Perform this exercise against resistance later as your leg gets stronger.  Leg Slides: Lying on your back, slowly slide your foot toward your buttocks, bending your knee up off the floor (only go as far as is comfortable). Then slowly slide your foot back down until your leg is flat on the floor again.  Angel Wings: Lying on your back spread your legs to the side as far apart as you can without causing discomfort.  Hamstring Strength:  Lying on your back, push your heel against the floor with your leg straight by tightening up the muscles of your buttocks.  Repeat, but this time bend your knee to a comfortable angle, and push your heel against the floor.  You may put a pillow under the heel to make it more comfortable if necessary.   A rehabilitation program following joint replacement surgery can speed recovery and prevent re-injury in the future due to weakened muscles. Contact your doctor or a physical therapist for more information on knee rehabilitation.   CONSTIPATION:  Constipation is defined medically as fewer than three stools per week and severe constipation as less than one stool per week.  Even if you have a regular bowel pattern at home, your normal regimen is likely to be disrupted due to multiple reasons following surgery.  Combination of anesthesia, postoperative narcotics, change in appetite and fluid intake all can affect your bowels.   YOU MUST use at least one of the following options; they are listed in order of increasing strength to get the job done.  They are all available over the counter, and you may need to use some, POSSIBLY even all of these options:    Drink plenty of fluids (prune juice may be helpful) and high fiber foods Colace 100 mg by mouth twice a  day  Senokot for constipation as directed and as needed Dulcolax (bisacodyl ), take with full glass of water  Miralax  (polyethylene glycol) once or twice a day as needed.  If you have tried all these things and are unable to have a bowel movement in the first 3-4 days after surgery call either your surgeon or your primary doctor.    If you experience loose stools or diarrhea, hold the medications until you stool forms back up.  If your symptoms do not get better within 1 week or if they get worse, check with your doctor.  If you experience the worst abdominal pain ever or develop nausea or vomiting, please contact the office immediately for further recommendations for treatment.  ITCHING:  If you experience itching with your medications, try taking only a single pain pill, or even half a pain pill at a time.  You can also use Benadryl over the counter for itching or also to help with sleep.   TED HOSE STOCKINGS:  Use stockings on both legs until for at least 2 weeks or as directed by physician office. They may be removed at night for sleeping.  MEDICATIONS:  See your medication summary on the "After Visit Summary" that nursing will review with you.  You may have some home medications which will be placed on hold until you complete the course of blood thinner medication.  It is important for you to complete the blood thinner medication as prescribed.  Blood clot prevention (DVT Prophylaxis): After surgery you are at an increased risk for a blood clot.  You were prescribed a blood thinner, Aspirin  81mg , to be taken twice daily for a total of 4 weeks from surgery to help reduce your risk of getting a blood clot.  Signs of a pulmonary embolus (blood clot in the lungs) include sudden short of breath, feeling lightheaded or dizzy, chest pain with a deep breath, rapid pulse rapid breathing.  Signs of a blood clot in your arms or legs include new unexplained swelling and cramping, warm, red or darkened skin  around the painful area.  Please call the office or 911 right away if these signs or symptoms develop.  PRECAUTIONS:   If you experience chest pain or shortness of breath - call 911 immediately for transfer to the hospital emergency department.   If you develop a fever greater that 101 F, purulent drainage from wound, increased redness or drainage from wound, foul odor from the wound/dressing, or calf pain - CONTACT YOUR SURGEON.                                                   FOLLOW-UP APPOINTMENTS:  If you do not already have a post-op appointment, please call the office for an appointment to be seen by your surgeon.  Guidelines for how soon to be seen are listed in your "After Visit Summary", but are typically between 2-3 weeks after surgery.  If you have a specialized bandage, you may be told to follow up 1 week after surgery.  POST-OPERATIVE OPIOID TAPER INSTRUCTIONS: It is important to wean off of your opioid medication as soon as possible. If you do not need pain medication after your surgery it is ok to stop day one. Opioids include: Codeine, Hydrocodone (Norco, Vicodin), Oxycodone (Percocet, oxycontin ) and hydromorphone amongst others.  Long term and even short term use of opiods can cause: Increased pain response Dependence Constipation Depression Respiratory depression And more.  Withdrawal symptoms can include Flu like symptoms Nausea, vomiting And more Techniques to manage these symptoms Hydrate well Eat regular healthy meals Stay active Use relaxation techniques(deep breathing, meditating, yoga) Do Not substitute Alcohol to help with tapering If you have been on opioids for less than two weeks and do not have pain than it is ok to stop all together.  Plan to wean off of opioids This plan should start within one week post op of your joint replacement. Maintain the same interval or time between taking each dose and first decrease the dose.  Cut the total daily intake of  opioids by one tablet each day Next start to increase the time between doses. The last dose that should be eliminated is the evening dose.   MAKE SURE  YOU:  Understand these instructions.  Get help right away if you are not doing well or get worse.    Thank you for letting us  be a part of your medical care team.  It is a privilege we respect greatly.  We hope these instructions will help you stay on track for a fast and full recovery!

## 2024-07-02 NOTE — Op Note (Signed)
 07/02/2024  9:40 PM  PATIENT:  Melanie Carrillo   MRN: [id number]  PRE-OPERATIVE DIAGNOSIS: Left intertrochanteric fracture nonunion   POST-OPERATIVE DIAGNOSIS:  same  PROCEDURE: Conversion left intertrochanteric fracture nonunion to total hip arthroplasty. Application of incisional wound vac  PREOPERATIVE INDICATIONS:    Melanie Carrillo is an 89 y.o. female who sustained a left intertrochanteric femur fracture fixed in May 2023.  Unfortunately developed a nonunion and underwent revision fixation and September 2023.  She had wound healing issues with persistent drainage and underwent debridement in December 2023 ultimately healing.  Cultures at this time were negative for infection.  Over the past 2 years she has had continued progressive pain in the left hip and groin area.  Repeat imaging has shown persistent nonunion of the intertrochanteric femur fracture with significant proximal femoral bone loss especially in the medial femoral shaft.  Given the amount of proximal bone loss and nonunion we obtained inflammatory markers which were mildly elevated.  Aspiration was attempted which yielded a dry tap.  Her incisions and wounds appear to be well-healed at this point.  Elected for proximal femoral replacement to remove the nonunion proximal femoral bone and convert her to a arthroplasty to allow for immediate weightbearing. The risks benefits and alternatives were discussed with the patient including but not limited to the risks of nonoperative treatment, versus surgical intervention including infection, bleeding, nerve injury, periprosthetic fracture, the need for revision surgery, dislocation, leg length discrepancy, blood clots, cardiopulmonary complications, morbidity, mortality, among others, and they were willing to proceed.     OPERATIVE REPORT     SURGEON:  Toribio Higashi, MD    ASSISTANT: Bernarda Mclean, PA-C, (Present throughout the entire procedure,  necessary for completion of  procedure in a timely manner, assisting with retraction, instrumentation, and closure)     ANESTHESIA: Spinal  ESTIMATED BLOOD LOSS: 500cc    COMPLICATIONS:  None.     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  Successful removal of the intramedullary nail.  1 distal broken distal interlock screw.  Successful implantation of total hip arthroplasty.  Dual mobility utilized for added stability.  COMPONENTS:   Stryker Trident 2 size 50 mm acetabular shell, MDM liner, 6.5 hex screws x 2, GMR S proximal femoral placement with 70 mm stem extension, 15 mm x 127 mm cemented straight distal stem Implant Name Type Inv. Item Serial No. Manufacturer Lot No. LRB No. Used Action  CEMENT RESTRICTOR BONE PREP ST - [id number] Cement CEMENT RESTRICTOR BONE PREP ST  [employer] [id number] Left 1 Implanted  CEMENT BONE SIMPLEX SPEEDSET - [id number] Cement CEMENT BONE SIMPLEX SPEEDSET  [employer] [id number] Left 2 Implanted  SLEEVE CABLE VT - [id number] Orthopedic Implant SLEEVE CABLE VT  [employer] [id number] Left 1 Implanted  SHELL TRIDENT II CLUST 50 - [id number] Shell SHELL TRIDENT II CLUST 50  [employer] [id number] Left 1 Implanted  LINER CEMENTLESS SZ D 38 HIP - [id number] Liner LINER CEMENTLESS SZ D 38 HIP  [employer] [id number] Left 1 Implanted  SCREW HEX LP 6.5X25 - [id number] Screw SCREW HEX LP 6.5X25  [employer] [id number] Left 1 Implanted  SCREW HEX LP 6.5X25 - [id number] Screw SCREW HEX LP 6.5X25  [employer] [id number] Left 1 Implanted  STEM FEM EXT HIP CMT 15X127 - [id number] Stem STEM FEM EXT HIP CMT 15X127  [employer] [id number] Left 1 Implanted  EXTENSION PIECE Hips   [employer] [id number] Left 1 Implanted  PROXIMAL FEMORAL COMPONENT LEFT V40 Hips   [employer] [id number] Left 1 Implanted  HEAD HIP LFIT V40 22 - [id number] Head HEAD HIP LFIT V40 22  [employer] [id number] Left 1 Implanted   INSERT MDM LINER 22.2X38 38D - [id number] Insert INSERT MDM LINER 22.2X38 38D  [employer] [id number] Left 1 Implanted    The aquamantis was utilized for this case to help facilitate better hemostasis as patient was felt to be at increased risk of bleeding because of complex case requiring increased OR time and/or exposure.    PROCEDURE IN DETAIL:   The patient was met in the holding area and  identified.  The appropriate hip was identified and marked at the operative site.  The patient was then transported to the OR  and  placed under anesthesia.  At that point, the patient was  placed in the lateral decubitus position with the operative side up and  secured to the operating room table  and all bony prominences padded. A subaxillary role was also placed.    The operative lower extremity was prepped from the iliac crest to the distal leg.  Sterile draping was performed.  Preoperative antibiotics, 2 gm of ancef ,1 gm of Tranexamic Acid , and 8 mg of Decadron  administered. Time out was performed prior to incision.      A routine posterolateral approach was utilized via sharp dissection  carried down to the subcutaneous tissue.  Gross bleeders were Bovie coagulated.  The iliotibial band was identified and incised along the length of the skin incision through the glute max fascia.  Charnley retractor was placed with care to protect the sciatic nerve posteriorly.  With the hip internally rotated, the piriformis tendon was identified and released from the femoral insertion and tagged with a #5 Ethibond.  A capsulotomy was then performed off the femoral insertion and also tagged with a #5 Ethibond.   The hip was then dislocated and relocated.  We turned our attention to performing the osteotomy of the greater trochanter.  The osteotomy retained attachments of the abductors and vastus lateralis.  This was translated anteriorly.  We then circumferentially expose the proximal femur releasing all soft tissue  attachments and capsule subperiosteally.  We then removed the nail for starting with the distal interlocks.  The most distal interlock was broken.  Were able to disengage it from the nail by pushing it through with a 4 mm drill bit.  Proximally we would then remove the Seph medullary screw and the nail in its entirety.  Elected to leave the broken distal screw piece in the femur.  We then made a resection of the proximal femur below the nonunion area.  The proximal femur was then carefully removed without any issues.  After this resection there was still some medial bone defect.  We resected 1 additional inch to get down to healthy circumferential bone.  A single cerclage cable was passed just distal to the level of the resection for stability and to prevent distal femur fracture propagation.    I then exposed the deep acetabulum, notably there was moderate acetabular wear in the superior weightbearing portion of the acetabulum so elected to proceed with a total hip arthroplasty and prepared the acetabulum for an acetabular shell. I cleared out any tissue including the ligamentum teres.  After adequate visualization, I excised the labrum.  I then started reaming with a 46 mm reamer, first medializing to the floor of the cotyloid fossa, and then in the position of  the cup aiming towards the greater sciatic notch, matching the version of the transverse acetabular ligament and tucked under the anterior wall. I reamed up to 50 mm reamer with good bony bed preparation and a 50 mm cup was chosen.  The real cup was then impacted into place.  Appropriate version and inclination was confirmed clinically matching their bony anatomy, and also with the use of the jig.  I placed 2 screws in the posterior superior quadrant to augment fixation.  A MDM liner was placed and impacted. It was confirmed to be appropriately seated and the acetabular retractors were removed.    I then reamed the diaphysis for the proximal femoral  replacement stem.  I reamed for 127 mm length stem.  I had good cortical fill with a 16 mm reamer confirmed on fluoroscopic images.  Elected to place a 15 mm stem.  On the back table I prepared a proximal femur trial.  A 60 mm modular components seem to match the length of the resected femur; however knowing she was about a centimeter short in length on that left side elected to use a 70 mm for the trial.   A trial head was utilized, and I reduced the hip and it was found to have near equal restoration of leg lengths.  I attempted the 80 mm trial but this seemed to be too tight and long.  Fluoroscopy also confirmed appropriate component position. No evidence or concern for fracture.  It was difficult to assess rotational stability as the trial, opponent could rotate freely.    The real femoral components were opened on the back table and assembled by myself. We then prepared canal for cementation.  The cement restrictor was measured and inserted distally.  The canal was then irrigated with the pulse lavage and 3 L of normal saline.  2 bags of Simplex cement were prepared.  Using the cement gun the cement was inserted distally and the canal was filled.  We then pressurized the canal. The real implant was then inserted matching the patient's native anteversion of approximately 25 degrees.  We then waited for 13 minutes for the cement to be fully set.  Excess cement was removed.  A lap was placed in the acetabulum prior to cementing was also removed and the acetabulum was assessed to make sure there was no cement or bone fragments.  I again trialed and selected a +31mm ball. The hip was then reduced and taken through a range of motion. There was no impingement with full extension and 90 degrees external rotation.  The hip was stable at the position of sleep and with 90 degrees flexion and 80 degrees of internal rotation. Leg lengths were  again assessed and felt to be restored.  We then opened, and I assembled  the dual mobility head ball on the back table impacted the real head ball into place.  The posterior capsule was then closed with #5 Ethibond.   The greater trochanter osteotomy was repaired with 3 fiber tape sutures secured to the shoulder over the proximal femoral implant.  I then irrigated the hip copiously with irresept, dilute Betadine  and with normal saline pulse lavage. Periarticular injection was then performed with Exparel .  Vastus lateralis was repaired with running #1 Vicryl.   We repaired the IT band and glute fascia #1 barbed suture, followed by 0 barbed suture for the subcutaneous fat.  Skin was closed with 2-0 Vicryl and 3-0 Nylon.  Prevena wound VAC dressing was applied.  The patient was then awakened and returned to PACU in stable and satisfactory condition.  Leg lengths in the supine position were assessed and felt to be clinically equal. There were no complications.  Post op recs: WB: WBAT LLE, posterior hip precautions x6 weeks Abx: ancef  in house, folow up intra-op cxs, discharge on extended oral abx Imaging: PACU pelvis Xray Dressing: Aquacell, keep intact until follow up DVT prophylaxis: Aspirin  81BID starting POD1 Follow up: 2 weeks after surgery for a wound check with Dr. Edna at [facility].  Address: [address], [place], [place] [address]  Office Phone: [phone]   Toribio Edna, MD Orthopedic Surgeon

## 2024-07-02 NOTE — Transfer of Care (Signed)
 Immediate Anesthesia Transfer of Care Note  Patient: Melanie Carrillo  Procedure(s) Performed: CONVERSION, PREVIOUS HIP SURGERY, TO TOTAL HIP ARTHROPLASTY (Left: Hip)  Patient Location: PACU  Anesthesia Type:Spinal  Level of Consciousness: awake  Airway & Oxygen Therapy: Patient Spontanous Breathing and Patient connected to face mask oxygen  Post-op Assessment: Report given to RN and Post -op Vital signs reviewed and stable  Post vital signs: Reviewed and stable  Last Vitals:  Vitals Value Taken Time  BP 125/69 07/02/24 19:10  Temp    Pulse 60 07/02/24 19:13  Resp 12 07/02/24 19:13  SpO2 96 % 07/02/24 19:13  Vitals shown include unfiled device data.  Last Pain:  Vitals:   07/02/24 1149  TempSrc:   PainSc: 6       Patients Stated Pain Goal: 5 (07/02/24 1117)  Complications: No notable events documented.

## 2024-07-03 LAB — CBC
HCT: 23.3 % — ABNORMAL LOW (ref 36.0–46.0)
Hemoglobin: 7.5 g/dL — ABNORMAL LOW (ref 12.0–15.0)
MCH: 35.4 pg — ABNORMAL HIGH (ref 26.0–34.0)
MCHC: 32.2 g/dL (ref 30.0–36.0)
MCV: 109.9 fL — ABNORMAL HIGH (ref 80.0–100.0)
Platelets: 193 K/uL (ref 150–400)
RBC: 2.12 MIL/uL — ABNORMAL LOW (ref 3.87–5.11)
RDW: 14.2 % (ref 11.5–15.5)
WBC: 13.8 K/uL — ABNORMAL HIGH (ref 4.0–10.5)
nRBC: 0.3 % — ABNORMAL HIGH (ref 0.0–0.2)

## 2024-07-03 LAB — BASIC METABOLIC PANEL WITH GFR
Anion gap: 6 (ref 5–15)
BUN: 8 mg/dL (ref 8–23)
CO2: 23 mmol/L (ref 22–32)
Calcium: 8.7 mg/dL — ABNORMAL LOW (ref 8.9–10.3)
Chloride: 103 mmol/L (ref 98–111)
Creatinine, Ser: 0.61 mg/dL (ref 0.44–1.00)
GFR, Estimated: >60 mL/min (ref >60)
Glucose, Bld: 144 mg/dL — ABNORMAL HIGH (ref 70–99)
Potassium: 4.4 mmol/L (ref 3.5–5.1)
Sodium: 131 mmol/L — ABNORMAL LOW (ref 135–145)

## 2024-07-03 NOTE — Plan of Care (Signed)
   Problem: Clinical Measurements: Goal: Diagnostic test results will improve Outcome: Progressing

## 2024-07-03 NOTE — Progress Notes (Signed)
     Subjective:  Patient reports pain as moderate.  Had a tough evening after surgery but reports that pain is better this morning.  Discussed plan for mobilization with therapy.  Will follow-up cultures and recheck CBC tomorrow.  Yesterday's total administered Morphine  Milligram Equivalents: 100   Objective:   VITALS:   Vitals:   07/02/24 2301 07/02/24 2355 07/03/24 0150 07/03/24 0649  BP: (!) 127/94 (!) 137/95 (!) 113/55 (!) 93/39  Pulse: 96 92 80 62  Resp: 14 14 14 15   Temp: 97.8 F (36.6 C) (!) 97.3 F (36.3 C) (!) 97.3 F (36.3 C) (!) 97.5 F (36.4 C)  TempSrc:  Oral Oral Oral  SpO2: 99% 100% 100% 95%  Weight:      Height:        Sensation intact distally Intact pulses distally Dorsiflexion/Plantar flexion intact Incision: dressing C/D/I Compartment soft    Lab Results  Component Value Date   WBC 13.8 (H) 07/03/2024   HGB 7.5 (L) 07/03/2024   HCT 23.3 (L) 07/03/2024   MCV 109.9 (H) 07/03/2024   PLT 193 07/03/2024   BMET    Component Value Date/Time   NA 131 (L) 07/03/2024 0539   NA 136 03/26/2024 1614   K 4.4 07/03/2024 0539   CL 103 07/03/2024 0539   CO2 23 07/03/2024 0539   GLUCOSE 144 (H) 07/03/2024 0539   BUN 8 07/03/2024 0539   BUN 12 03/26/2024 1614   CREATININE 0.61 07/03/2024 0539   CALCIUM 8.7 (L) 07/03/2024 0539   EGFR 78 03/26/2024 1614   GFRNONAA >60 07/03/2024 0539      Xray: Total hip arthroplasty components with proximal femoral replacement in good alignment no adverse features  Assessment/Plan: 1 Day Post-Op   Principal Problem:   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  If cultures positive will consult infectious disease for extended course of IV antibiotics Imaging: PACU pelvis Xray Dressing: prevena wound vac, keep intact until follow up DVT prophylaxis: Aspirin  81BID starting POD1 Follow up: 1 weeks after surgery for a wound check with Dr. Edna at [facility].  Address: [address], [place], [place] [address]  Office Phone: [phone]    TORIBIO DELENA EDNA 07/03/2024, 6:54 AM   Toribio Edna, MD  Contact information:   [phone] 7am-5pm epic message Dr. Edna, or call office for patient follow up: [phone] After hours and holidays please check [email or web address] for group call information for [facility]

## 2024-07-03 NOTE — Evaluation (Signed)
 Physical Therapy Evaluation Patient Details Name: Melanie Carrillo MRN: [id number] DOB: 11-May-1935 Today's Date: 07/03/2024  History of Present Illness  Pt is an 89y.o. 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 PMH significant for, but not limited to: anxiety, depression, colon cancer, DDD, and GERD.   Clinical Impression  Pt is POD #1 s/p Conversion left intertrochanteric fracture nonunion to total hip arthroplasty with proximal femur replacement resulting in the deficits listed below (see PT Problem List). Pt is typically modified independent ADLs (daughter provides supervision for shower transfers) and mobility with use of RW. Pt performed bed mobility with MOD A+2, sit to stand transfers with MOD A progressed to MIN A on 2nd trial with cues for safe hand placement. Pt ambulated total of ~56ft with MIN A- further mobility limited due to pt reported pain. Cues provided for step to gait patten with no LOB observed. Pt will have assist from family members upon d/c. Pt will benefit from skilled PT to maximize functional mobility and increase independence.          If plan is discharge home, recommend the following: A little help with walking and/or transfers;A lot of help with bathing/dressing/bathroom;Assistance with cooking/housework;Help with stairs or ramp for entrance;Assist for transportation   Can travel by private vehicle        Equipment Recommendations None recommended by PT (pt owns RW)  Recommendations for Other Services  OT consult    Functional Status Assessment Patient has had a recent decline in their functional status and demonstrates the ability to make significant improvements in function in a reasonable and predictable amount of time.     Precautions / Restrictions Precautions Precautions: Posterior Hip;Fall Restrictions Weight Bearing Restrictions Per Provider Order:  No Other Position/Activity Restrictions: WBAT      Mobility  Bed Mobility Overal bed mobility: Needs Assistance Bed Mobility: Supine to Sit     Supine to sit: Mod assist, +2 for safety/equipment, +2 for physical assistance     General bed mobility comments: pt able to initiaite LEs to EOB with increased time and support fo L LE. Educated on posterior precautions prior to mobility.    Transfers Overall transfer level: Needs assistance Equipment used: Rolling walker (2 wheels) Transfers: Sit to/from Stand, Bed to chair/wheelchair/BSC Sit to Stand: Mod assist           General transfer comment: initially MOD A from EOB, progressed to MIN A from [facility] with cues for hand placement. Step transfer from EOB to [facility].    Ambulation/Gait Ambulation/Gait assistance: Min assist Gait Distance (Feet): 25 Feet Assistive device: Rolling walker (2 wheels) Gait Pattern/deviations: Step-to pattern, Decreased step length - left, Decreased step length - right, Decreased stride length, Antalgic, Narrow base of support Gait velocity: decreased     General Gait Details: slow speed, cues for sequencing and close chair follow provided for safey. No overt LOB observed. Distance limited by pain. Pt requestign more pain medications- RN aware.  Stairs            Wheelchair Mobility     Tilt Bed    Modified Rankin (Stroke Patients Only)       Balance Overall balance assessment: Needs assistance Sitting-balance support: No upper extremity supported, Feet supported Sitting balance-Leahy Scale: Fair     Standing balance support: Bilateral upper extremity supported, During functional activity, Reliant on assistive device for balance Standing balance-Leahy Scale: Poor  Pertinent Vitals/Pain Pain Assessment Pain Assessment: 0-10 Pain Score: 5  Pain Location: L hip Pain Descriptors / Indicators: Aching, Discomfort, Sore Pain Intervention(s):  Limited activity within patient's tolerance, Monitored during session, Premedicated before session, Repositioned, Ice applied, Patient requesting pain meds-RN notified    Home Living Family/patient expects to be discharged to:: Private residence Living Arrangements: Children Available Help at Discharge: Family (daughter and son-in-law) Type of Home: House Home Access: Ramped entrance       Home Layout: One level Home Equipment: Rollator (4 wheels);Rolling Walker (2 wheels);Shower seat;Shower seat - built in;BSC/3in1;Other (comment);Wheelchair - manual (adjustable bed) Additional Comments: pt reports daughter is an [employer] who works from home. grandson/granddaughter live nearby.    Prior Function               Mobility Comments: RW at baseline ADLs Comments: uses quad cane for stability with shower transfers- supervision for safety from daughter. uses door for support to stand from toilet seat- no grab bars.     Extremity/Trunk Assessment   Upper Extremity Assessment Upper Extremity Assessment: Generalized weakness    Lower Extremity Assessment Lower Extremity Assessment: LLE deficits/detail LLE Deficits / Details: postop weakness and pain, able to perform AROM PF/DF, fair quad set strength.       Communication   Communication Communication: Impaired Factors Affecting Communication: Hearing impaired    Cognition Arousal: Alert Behavior During Therapy: WFL for tasks assessed/performed   PT - Cognitive impairments: No apparent impairments                       PT - Cognition Comments: anxious with mobility due to pain Following commands: Intact       Cueing       General Comments      Exercises Total Joint Exercises Ankle Circles/Pumps: AROM, Both, 20 reps, Seated   Assessment/Plan    PT Assessment Patient needs continued PT services  PT Problem List Decreased range of motion;Decreased strength;Decreased activity tolerance;Decreased  balance;Decreased mobility;Decreased knowledge of use of DME;Pain;Decreased knowledge of precautions       PT Treatment Interventions DME instruction;Gait training;Functional mobility training;Therapeutic activities;Therapeutic exercise;Balance training;Neuromuscular re-education;Patient/family education    PT Goals (Current goals can be found in the Care Plan section)  Acute Rehab PT Goals Patient Stated Goal: want to have less pain and move better PT Goal Formulation: With patient Time For Goal Achievement: 07/17/24 Potential to Achieve Goals: Good    Frequency BID     Co-evaluation               AM-PAC PT 6 Clicks Mobility  Outcome Measure Help needed turning from your back to your side while in a flat bed without using bedrails?: A Lot Help needed moving from lying on your back to sitting on the side of a flat bed without using bedrails?: Total Help needed moving to and from a bed to a chair (including a wheelchair)?: A Lot Help needed standing up from a chair using your arms (e.g., wheelchair or bedside chair)?: A Lot Help needed to walk in hospital room?: A Little Help needed climbing 3-5 steps with a railing? : A Lot 6 Click Score: 12    End of Session Equipment Utilized During Treatment: Gait belt Activity Tolerance: Patient tolerated treatment well Patient left: in chair;with call bell/phone within reach;with chair alarm set Nurse Communication: Mobility status;Other (comment) (pt able to void during session with transfer to [facility]) PT Visit Diagnosis: Unsteadiness on feet (R26.81);Pain;Muscle weakness (  generalized) (M62.81);Difficulty in walking, not elsewhere classified (R26.2) Pain - Right/Left: Left Pain - part of body: Hip    Time: 8864-8783 PT Time Calculation (min) (ACUTE ONLY): 41 min   Charges:   PT Evaluation $PT Eval Low Complexity: 1 Low PT Treatments $Therapeutic Activity: 23-37 mins PT General Charges $$ ACUTE PT VISIT: 1 Visit          Tinnie BERRY PT, DPT  Acute Rehabilitation Services  Office [phone]  07/03/2024, 2:34 PM

## 2024-07-03 NOTE — TOC Transition Note (Signed)
 Transition of Care [facility]) - Discharge Note   Patient Details  Name: Melanie Carrillo MRN: [id number] Date of Birth: Dec 24, 1934  Transition of Care [facility] CM/SW Contact:  Alfonse JONELLE Rex, RN Phone Number: 07/03/2024, 12:01 PM   Clinical Narrative:   Met with patient at bedside to review dc therapy and home equipment needs, patient confirmed HH PT-[facility], has RW, no home DME needs. No INPT CM needs.     Final next level of care: Home w Home Health Services Barriers to Discharge: No Barriers Identified   Patient Goals and CMS Choice Patient states their goals for this hospitalization and ongoing recovery are:: return home          Discharge Placement                       Discharge Plan and Services Additional resources added to the After Visit Summary for                                       Social Drivers of Health (SDOH) Interventions SDOH Screenings   Food Insecurity: No Food Insecurity (07/02/2024)  Housing: Low Risk  (07/02/2024)  Transportation Needs: No Transportation Needs (07/02/2024)  Utilities: Not At Risk (07/02/2024)  Alcohol Screen: Low Risk  (11/26/2022)  Depression (PHQ2-9): Low Risk  (04/12/2024)  Financial Resource Strain: Low Risk  (03/22/2024)  Physical Activity: Inactive (03/22/2024)  Social Connections: Unknown (07/02/2024)  Stress: No Stress Concern Present (03/22/2024)  Tobacco Use: Low Risk  (07/02/2024)  Health Literacy: Inadequate Health Literacy (02/20/2024)     Readmission Risk Interventions    07/03/2024   12:01 PM  Readmission Risk Prevention Plan  Post Dischage Appt Complete  Medication Screening Complete  Transportation Screening Complete

## 2024-07-03 NOTE — Anesthesia Postprocedure Evaluation (Signed)
 Anesthesia Post Note  Patient: Melanie Carrillo  Procedure(s) Performed: CONVERSION, PREVIOUS HIP SURGERY, TO TOTAL HIP ARTHROPLASTY (Left: Hip)     Patient location during evaluation: PACU Anesthesia Type: Spinal Level of consciousness: awake, awake and alert and oriented Pain management: pain level controlled Vital Signs Assessment: post-procedure vital signs reviewed and stable Respiratory status: spontaneous breathing, nonlabored ventilation and respiratory function stable Cardiovascular status: blood pressure returned to baseline and stable Postop Assessment: no headache, no backache, spinal receding and no apparent nausea or vomiting Anesthetic complications: no   No notable events documented.  Last Vitals:    Last Pain:                 Garnette FORBES [place]

## 2024-07-04 ENCOUNTER — Encounter (HOSPITAL_COMMUNITY): Payer: Self-pay | Admitting: Orthopedic Surgery

## 2024-07-04 LAB — ACID FAST SMEAR (AFB, MYCOBACTERIA)
Acid Fast Smear: NEGATIVE
Acid Fast Smear: NEGATIVE
Acid Fast Smear: NEGATIVE

## 2024-07-04 LAB — CBC
HCT: 24.4 % — ABNORMAL LOW (ref 36.0–46.0)
Hemoglobin: 8 g/dL — ABNORMAL LOW (ref 12.0–15.0)
MCH: 34.9 pg — ABNORMAL HIGH (ref 26.0–34.0)
MCHC: 32.8 g/dL (ref 30.0–36.0)
MCV: 106.6 fL — ABNORMAL HIGH (ref 80.0–100.0)
Platelets: 207 K/uL (ref 150–400)
RBC: 2.29 MIL/uL — ABNORMAL LOW (ref 3.87–5.11)
RDW: 14.2 % (ref 11.5–15.5)
WBC: 11.3 K/uL — ABNORMAL HIGH (ref 4.0–10.5)
nRBC: 0.4 % — ABNORMAL HIGH (ref 0.0–0.2)

## 2024-07-04 MED ORDER — OXYCODONE HCL 5 MG PO TABS: 5.0000 mg | ORAL_TABLET | ORAL | 0 refills | Status: DC | PRN

## 2024-07-04 MED ORDER — POLYETHYLENE GLYCOL 3350 17 G PO PACK: 17.0000 g | PACK | Freq: Every day | ORAL | Status: DC

## 2024-07-04 MED ORDER — ONDANSETRON HCL 4 MG PO TABS: 4.0000 mg | ORAL_TABLET | Freq: Three times a day (TID) | ORAL | 0 refills | Status: AC | PRN

## 2024-07-04 MED ORDER — CELECOXIB 100 MG PO CAPS: 100.0000 mg | ORAL_CAPSULE | Freq: Two times a day (BID) | ORAL | 0 refills | Status: AC

## 2024-07-04 MED ORDER — ACETAMINOPHEN 500 MG PO TABS: 1000.0000 mg | ORAL_TABLET | Freq: Three times a day (TID) | ORAL | Status: AC | PRN

## 2024-07-04 MED ORDER — CEFADROXIL 500 MG PO CAPS: 500.0000 mg | ORAL_CAPSULE | Freq: Two times a day (BID) | ORAL | 0 refills | Status: AC

## 2024-07-04 MED ORDER — MUPIROCIN 2 % EX OINT: 1.0000 | TOPICAL_OINTMENT | Freq: Two times a day (BID) | CUTANEOUS | 0 refills | Status: AC

## 2024-07-04 MED ORDER — CHLORHEXIDINE GLUCONATE 4 % EX SOLN: 1.0000 | CUTANEOUS | 1 refills | Status: DC

## 2024-07-04 MED ORDER — ASPIRIN 81 MG PO TBEC: 81.0000 mg | DELAYED_RELEASE_TABLET | Freq: Two times a day (BID) | ORAL | Status: AC

## 2024-07-04 NOTE — Evaluation (Addendum)
 Occupational Therapy Evaluation Patient Details Name: Melanie Carrillo MRN: [id number] DOB: [birth year] Today's Date: 07/04/2024   History of Present Illness   Pt is an 89y.o. 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 PMH significant for, but not limited to: anxiety, depression, colon cancer, DDD, and GERD.     Clinical Impressions PTA pt lives with her daughter/son-in-law who assist with IADL tasks. At baseline pt uses a RW for mobility unless she is in the bathroom, where she has to use a QC due to the size of the bathroom. Pt currently min A for limited mobility @ RW level with mod vc to maintain post hip precautions and max A with LB ADL tasks. Acute OT will follow to maximize functional level of independence, reduce risk of falls and facilitate DC to next venue of care.      If plan is discharge home, recommend the following:   A little help with walking and/or transfers;A lot of help with bathing/dressing/bathroom;Assistance with cooking/housework;Assist for transportation;Help with stairs or ramp for entrance     Functional Status Assessment   Patient has had a recent decline in their functional status and demonstrates the ability to make significant improvements in function in a reasonable and predictable amount of time.     Equipment Recommendations   None recommended by OT     Recommendations for Other Services         Precautions/Restrictions   Precautions Precautions: Fall;Posterior Hip Precaution/Restrictions Comments: LLE wound vac Restrictions Weight Bearing Restrictions Per Provider Order: Yes LLE Weight Bearing Per Provider Order: Weight bearing as tolerated Other Position/Activity Restrictions: WBAT     Mobility Bed Mobility               General bed mobility comments: OOB in chair    Transfers Overall transfer level: Needs  assistance Equipment used: Rolling walker (2 wheels) Transfers: Sit to/from Stand, Bed to chair/wheelchair/BSC Sit to Stand: Min assist     Step pivot transfers: Min assist     General transfer comment: sit>stand from EOB, toilet with use of grab bar      Balance Overall balance assessment: Needs assistance Sitting-balance support: No upper extremity supported, Feet supported Sitting balance-Leahy Scale: Good     Standing balance support: Bilateral upper extremity supported, During functional activity, Reliant on assistive device for balance Standing balance-Leahy Scale: Poor Standing balance comment: BUE required                           ADL either performed or assessed with clinical judgement   ADL Overall ADL's : Needs assistance/impaired Eating/Feeding: Independent   Grooming: Set up;Sitting   Upper Body Bathing: Set up;Sitting   Lower Body Bathing: Maximal assistance;Sit to/from stand   Upper Body Dressing : Minimal assistance;Sitting   Lower Body Dressing: Maximal assistance;Sit to/from stand   Toilet Transfer: BSC/3in1;Mod assistance;Rolling walker (2 wheels)   Toileting- Clothing Manipulation and Hygiene: Maximal assistance       Functional mobility during ADLs: Minimal assistance;Rolling walker (2 wheels);Cueing for sequencing;Cueing for safety       Vision Baseline Vision/History: 1 Wears glasses;3 Glaucoma (L eye - partially blind)       Perception         Praxis         Pertinent Vitals/Pain Pain Assessment Pain Assessment: 0-10 Pain Score: 7  Pain Location: L hip Pain Descriptors /  Indicators: Discomfort, Grimacing, Guarding Pain Intervention(s): Limited activity within patient's tolerance, Patient requesting pain meds-RN notified, Ice applied     Extremity/Trunk Assessment Upper Extremity Assessment Upper Extremity Assessment: Generalized weakness (but fnctional)   Lower Extremity Assessment Lower Extremity Assessment:  Defer to PT evaluation LLE Deficits / Details: postop weakness and pain, able to perform AROM PF/DF, fair quad set strength.   Cervical / Trunk Assessment Cervical / Trunk Assessment: Other exceptions (hx of back problems)   Communication Communication Communication: Impaired Factors Affecting Communication: Hearing impaired   Cognition Arousal: Alert Behavior During Therapy: WFL for tasks assessed/performed Cognition: No apparent impairments                               Following commands: Intact       Cueing  General Comments   Cueing Techniques: Verbal cues;Visual cues  Daughter/pt asking about rehab - CM notified   Exercises Exercises: Other exercises Other Exercises Other Exercises: incentive spirometer x 10 - able to pull > 750 ml   Shoulder Instructions      Home Living Family/patient expects to be discharged to:: Private residence Living Arrangements: Children Available Help at Discharge: Family (daughter and son-in-law) Type of Home: House Home Access: Ramped entrance     Home Layout: One level     Bathroom Shower/Tub: Producer, Television/film/video: Handicapped height Bathroom Accessibility: Yes How Accessible: Accessible via walker Home Equipment: Rollator (4 wheels);Rolling Walker (2 wheels);Shower seat;BSC/3in1;Other (comment);Wheelchair - manual;Hand held shower head (adjustable bed)   Additional Comments: pt reports daughter is an [employer] who works from home. grandson/granddaughter live nearby.      Prior Functioning/Environment Prior Level of Function : Needs assist       Physical Assist : ADLs (physical)   ADLs (physical): IADLs Mobility Comments: RW at baseline ADLs Comments: uses quad cane for stability with shower transfers- supervision for safety from daughter. uses door for support to stand from toilet seat- no grab bars.    OT Problem List: Decreased strength;Decreased range of motion;Impaired balance (sitting and/or  standing);Decreased safety awareness;Decreased knowledge of use of DME or AE;Decreased knowledge of precautions;Pain   OT Treatment/Interventions: Self-care/ADL training;Therapeutic exercise;DME and/or AE instruction;Therapeutic activities;Patient/family education;Balance training      OT Goals(Current goals can be found in the care plan section)   Acute Rehab OT Goals Patient Stated Goal: get stronger OT Goal Formulation: With patient/family Time For Goal Achievement: 07/18/24 Potential to Achieve Goals: Good   OT Frequency:  Min 2X/week    Co-evaluation              AM-PAC OT 6 Clicks Daily Activity     Outcome Measure Help from another person eating meals?: None Help from another person taking care of personal grooming?: A Little Help from another person toileting, which includes using toliet, bedpan, or urinal?: A Lot Help from another person bathing (including washing, rinsing, drying)?: A Lot Help from another person to put on and taking off regular upper body clothing?: A Little Help from another person to put on and taking off regular lower body clothing?: A Lot 6 Click Score: 16   End of Session Equipment Utilized During Treatment: Gait belt;Rolling walker (2 wheels) Nurse Communication: Mobility status;Other (comment);Patient requests pain meds (precautions)  Activity Tolerance: Patient tolerated treatment well Patient left: in chair;with call bell/phone within reach;with chair alarm set  OT Visit Diagnosis: Unsteadiness on feet (R26.81);Other abnormalities of gait  and mobility (R26.89);Muscle weakness (generalized) (M62.81);History of falling (Z91.81);Pain Pain - Right/Left: Left Pain - part of body: Hip                Time: 8770-8695 OT Time Calculation (min): 35 min Charges:  OT General Charges $OT Visit: 1 Visit OT Evaluation $OT Eval Low Complexity: 1 Low OT Treatments $Self Care/Home Management : 8-22 mins  Kreg Sink, OT/L   Acute OT Clinical  Specialist Acute Rehabilitation Services Pager [phone] Office [phone]   [facility] 07/04/2024, 1:38 PM

## 2024-07-04 NOTE — TOC Progression Note (Signed)
 Transition of Care [facility]) - Progression Note   Patient Details  Name: Melanie Carrillo MRN: [id number] Date of Birth: 02-11-1935  [facility] of [facility] CM/SW Contact  Duwaine GORMAN Aran, LCSW Phone Number: 07/04/2024, 3:33 PM  Clinical Narrative: Care management consulted for SNF. Daughter is requesting [facility] as first choice as patient has been there before. FL2 done; PASRR confirmed. Initial referral faxed out. Care management awaiting bed offers.  Expected Discharge Plan: Skilled Nursing Facility Barriers to Discharge: SNF Pending bed offer, Insurance Authorization  Expected Discharge Plan and Services Post Acute Care Choice: Skilled Nursing Facility  Social Drivers of Health (SDOH) Interventions SDOH Screenings   Food Insecurity: No Food Insecurity (07/02/2024)  Housing: Low Risk  (07/02/2024)  Transportation Needs: No Transportation Needs (07/02/2024)  Utilities: Not At Risk (07/02/2024)  Alcohol Screen: Low Risk  (11/26/2022)  Depression (PHQ2-9): Low Risk  (04/12/2024)  Financial Resource Strain: Low Risk  (03/22/2024)  Physical Activity: Inactive (03/22/2024)  Social Connections: Unknown (07/02/2024)  Stress: No Stress Concern Present (03/22/2024)  Tobacco Use: Low Risk  (07/02/2024)  Health Literacy: Inadequate Health Literacy (02/20/2024)   Readmission Risk Interventions    07/03/2024   12:01 PM  Readmission Risk Prevention Plan  Post Dischage Appt Complete  Medication Screening Complete  Transportation Screening Complete

## 2024-07-04 NOTE — Progress Notes (Signed)
     Subjective:  Patient reports pain as moderate.  Just finished using bedside commode, seen going from standing position back to sitting/laying on bed with assist.  No overnight events.  Mobilized 25 ft with PT yesterday.  About to eat breakfast.  Denies numbness or tingling.  Continuing to follow cultures.  Hopeful for dispo home with HHPT.  Yesterday's total administered Morphine  Milligram Equivalents: 45   Objective:   VITALS:   Vitals:   07/03/24 0654 07/03/24 1440 07/03/24 2156 07/04/24 0614  BP: (!) 107/48 (!) 96/46 (!) 138/53 (!) 137/90  Pulse: 64 69 72 92  Resp:   16 15  Temp:  (!) 97.5 F (36.4 C) 97.9 F (36.6 C) 98.5 F (36.9 C)  TempSrc:  Oral Oral Oral  SpO2:  94% 92% 92%  Weight:      Height:        Sitting in bed in NAD Observed moving from standing position at bedside commode back to sitting/laying on bed with help of assist Sensation intact distally Intact pulses distally Dorsiflexion/Plantar flexion intact Incision: dressing C/D/I Compartment soft Wound vac in place maintaining good seal, no fluid collected in cannister Wiggles toes appropriately    Lab Results  Component Value Date   WBC 11.3 (H) 07/04/2024   HGB 8.0 (L) 07/04/2024   HCT 24.4 (L) 07/04/2024   MCV 106.6 (H) 07/04/2024   PLT 207 07/04/2024   BMET    Component Value Date/Time   NA 131 (L) 07/03/2024 0539   NA 136 03/26/2024 1614   K 4.4 07/03/2024 0539   CL 103 07/03/2024 0539   CO2 23 07/03/2024 0539   GLUCOSE 144 (H) 07/03/2024 0539   BUN 8 07/03/2024 0539   BUN 12 03/26/2024 1614   CREATININE 0.61 07/03/2024 0539   CALCIUM 8.7 (L) 07/03/2024 0539   EGFR 78 03/26/2024 1614   GFRNONAA >60 07/03/2024 0539      Xray: Total hip arthroplasty components with proximal femoral replacement in good alignment no adverse features  Assessment/Plan: 2 Days Post-Op   Principal Problem:   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  Hold on transfusion, can reconsider if drops below 7.0.   Post op recs: WB: WBAT LLE, posterior hip precautions x6 weeks Abx: ancef  in house, folow up intra-op cxs, discharge on extended oral abx, positive Gram stain IntraOp could be false positive secondary to metal corrosion and debri seen IntraOp, no obvious purulence IntraOp.  If cultures positive will consult infectious disease for extended course of IV antibiotics Imaging: PACU pelvis Xray Dressing: prevena wound vac, keep intact until follow up DVT prophylaxis: Aspirin  81BID starting POD1 Follow up: 1 weeks after surgery for a wound check with Dr. Edna at [facility].  Address: [address], [place], [place] [address]  Office Phone: [phone]    Melanie Carrillo 07/04/2024, 7:06 AM    Contact information:   Weekdays 7am-5pm epic message Dr. Edna, or call office for patient follow up: [phone] After hours and holidays please check [email or web address] for group call information for [facility]

## 2024-07-04 NOTE — Progress Notes (Addendum)
 Physical Therapy Treatment Patient Details Name: Melanie Carrillo MRN: [id number] DOB: [birth year] Today's Date: 07/04/2024   History of Present Illness Pt is an 89y.o. 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 PMH significant for, but not limited to: anxiety, depression, colon cancer, DDD, and GERD.    PT Comments  Pt seen for PT tx with pt agreeable. Pt demonstrates improvement in strength, able to complete bed mobility with only min assist on this date with cuing re: use of hospital bed features. Pt is able to ambulate in room, bathroom with RW & CGA, min cuing but overall gait limited by pain & fatigue. Pt agreeable & performs LLE strengthening exercises. Recommend ongoing PT services to progress mobility as able.   Addendum: Provided pt with gait belt & educated her on family using it to assist her with mobility.    If plan is discharge home, recommend the following: A little help with walking and/or transfers;A lot of help with bathing/dressing/bathroom;Assistance with cooking/housework;Help with stairs or ramp for entrance;Assist for transportation   Can travel by private vehicle        Equipment Recommendations  None recommended by PT (pt already has RW & BSC)    Recommendations for Other Services       Precautions / Restrictions Precautions Precautions: Fall;Posterior Hip Precaution/Restrictions Comments: LLE wound vac Restrictions Weight Bearing Restrictions Per Provider Order: Yes LLE Weight Bearing Per Provider Order: Weight bearing as tolerated     Mobility  Bed Mobility Overal bed mobility: Independent Bed Mobility: Supine to Sit     Supine to sit: Min assist, HOB elevated, Used rails (pt able to move BLE to EOB, cuing to use bed rails, holds to PT's hand to fully come to sitting on R side of bed)          Transfers Overall transfer level: Needs  assistance Equipment used: Rolling walker (2 wheels) Transfers: Sit to/from Stand Sit to Stand: Min assist           General transfer comment: sit>stand from EOB, toilet with use of grab bar    Ambulation/Gait Ambulation/Gait assistance: Contact guard assist Gait Distance (Feet): 10 Feet (+ 15 ft) Assistive device: Rolling walker (2 wheels) Gait Pattern/deviations: Decreased step length - right, Decreased step length - left, Decreased stride length, Decreased dorsiflexion - right, Decreased dorsiflexion - left, Trunk flexed Gait velocity: decreased     General Gait Details: cuing re: RW management, side stepping in small spaces   Stairs             Wheelchair Mobility     Tilt Bed    Modified Rankin (Stroke Patients Only)       Balance Overall balance assessment: Needs assistance Sitting-balance support: No upper extremity supported, Feet supported Sitting balance-Leahy Scale: Fair     Standing balance support: Bilateral upper extremity supported, During functional activity, Reliant on assistive device for balance Standing balance-Leahy Scale: Fair                              Hotel Manager: Impaired Factors Affecting Communication: Hearing impaired  Cognition Arousal: Alert Behavior During Therapy: WFL for tasks assessed/performed   PT - Cognitive impairments: No apparent impairments                       PT - Cognition Comments: anxious 2/2  pain Following commands: Intact      Cueing    Exercises Total Joint Exercises Quad Sets: AROM, Strengthening, Left, 5 reps Hip ABduction/ADduction: AAROM, Strengthening, Left, 10 reps Long Arc Quad: AROM, Left, Strengthening, 10 reps, Seated    General Comments General comments (skin integrity, edema, etc.): continent void on BSC; wound vac intact & plugged in at beginning & end of session; provided pt with HEP handout & posterior hip precaution handout       Pertinent Vitals/Pain Pain Assessment Pain Assessment: 0-10 Pain Score: 7  Pain Location: L hip Pain Descriptors / Indicators: Discomfort, Grimacing, Guarding Pain Intervention(s): Monitored during session, Patient requesting pain meds-RN notified, Repositioned, Limited activity within patient's tolerance    Home Living                          Prior Function            PT Goals (current goals can now be found in the care plan section) Acute Rehab PT Goals Patient Stated Goal: want to have less pain and move better PT Goal Formulation: With patient Time For Goal Achievement: 07/17/24 Potential to Achieve Goals: Good Progress towards PT goals: Progressing toward goals    Frequency    BID      PT Plan      Co-evaluation              AM-PAC PT 6 Clicks Mobility   Outcome Measure  Help needed turning from your back to your side while in a flat bed without using bedrails?: A Little Help needed moving from lying on your back to sitting on the side of a flat bed without using bedrails?: A Lot Help needed moving to and from a bed to a chair (including a wheelchair)?: A Little Help needed standing up from a chair using your arms (e.g., wheelchair or bedside chair)?: A Little Help needed to walk in hospital room?: A Little Help needed climbing 3-5 steps with a railing? : A Lot 6 Click Score: 16    End of Session   Activity Tolerance: Patient limited by fatigue;Patient limited by pain Patient left: in chair;with chair alarm set;with call bell/phone within reach;with nursing/sitter in room Nurse Communication: Mobility status PT Visit Diagnosis: Unsteadiness on feet (R26.81);Pain;Muscle weakness (generalized) (M62.81);Difficulty in walking, not elsewhere classified (R26.2) Pain - Right/Left: Left Pain - part of body: Hip     Time: 8960-8897 PT Time Calculation (min) (ACUTE ONLY): 23 min  Charges:    $Therapeutic Activity: 23-37 mins PT  General Charges $$ ACUTE PT VISIT: 1 Visit                     Melanie Carrillo, PT, DPT 07/04/24, 12:48 PM   Melanie Carrillo 07/04/2024, 11:20 AM

## 2024-07-04 NOTE — Progress Notes (Signed)
 Physical Therapy Treatment Patient Details Name: Melanie Carrillo MRN: [id number] DOB: 11/05/1934 Today's Date: 07/04/2024   History of Present Illness Pt is an 89y.o. 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 PMH significant for, but not limited to: anxiety, depression, colon cancer, DDD, and GERD.    PT Comments  Pt seen for PT tx with pt agreeable despite fatigue, daughter present for entire session. Educated daughter on pt's CLOF, use of gait belt, posterior hip precautions. Pt is able to complete bed mobility with HOB elevated, no bed rails to simulate bed mobility at home. Pt is limited in gait distance by pain & fatigue, but only requires CGA. Recommend ongoing PT services to progress endurance, balance, strengthening, to reduce fall risk with mobility.    If plan is discharge home, recommend the following: A little help with walking and/or transfers;A lot of help with bathing/dressing/bathroom;Assistance with cooking/housework;Help with stairs or ramp for entrance;Assist for transportation   Can travel by private vehicle        Equipment Recommendations  None recommended by PT (pt already has RW & BSC)    Recommendations for Other Services       Precautions / Restrictions Precautions Precautions: Fall;Posterior Hip Precaution/Restrictions Comments: LLE wound vac Restrictions Weight Bearing Restrictions Per Provider Order: Yes LLE Weight Bearing Per Provider Order: Weight bearing as tolerated     Mobility  Bed Mobility Overal bed mobility: Needs Assistance Bed Mobility: Supine to Sit, Sit to Supine     Supine to sit: Supervision, HOB elevated (exit R side of bed, extra time) Sit to supine: Min assist, HOB elevated (light assistance to elevate LLE onto bed)        Transfers Overall transfer level: Needs assistance Equipment used: Rolling walker (2 wheels) Transfers:  Sit to/from Stand Sit to Stand: Contact guard assist, Min assist           General transfer comment: sit<>stand from EOB, low toilet with use of grab bar, min assist to transfer stand>sit onto toilet    Ambulation/Gait Ambulation/Gait assistance: Contact guard assist Gait Distance (Feet): 20 Feet (+ 10 ft) Assistive device: Rolling walker (2 wheels) Gait Pattern/deviations: Decreased step length - right, Decreased step length - left, Decreased stride length, Decreased dorsiflexion - right, Decreased dorsiflexion - left, Trunk flexed Gait velocity: decreased     General Gait Details: cuing re: RW management, side stepping in small spaces   Stairs             Wheelchair Mobility     Tilt Bed    Modified Rankin (Stroke Patients Only)       Balance Overall balance assessment: Needs assistance Sitting-balance support: No upper extremity supported, Feet supported Sitting balance-Leahy Scale: Good Sitting balance - Comments: sitting on toilet without LOB   Standing balance support: Bilateral upper extremity supported, During functional activity, Reliant on assistive device for balance Standing balance-Leahy Scale: Fair                              Hotel Manager: Impaired Factors Affecting Communication: Hearing impaired  Cognition Arousal: Alert Behavior During Therapy: Flat affect   PT - Cognitive impairments: No apparent impairments                       PT - Cognition Comments: anxious 2/2 pain Following commands: Intact  Cueing Cueing Techniques: Verbal cues, Visual cues  Exercises Total Joint Exercises Quad Sets: AROM, Strengthening, Left, 5 reps Hip ABduction/ADduction: AAROM, Strengthening, Left, 10 reps Long Arc Quad: AROM, Left, Strengthening, 10 reps, Seated    General Comments General comments (skin integrity, edema, etc.): Daughter & son-in-law present for session, son-in-law stepping out.  Educated family on pt's CLOF, use of gait belt & positioning in relation to pt when assisting with mobility, posterior hip precautions.      Pertinent Vitals/Pain Pain Assessment Pain Assessment: 0-10 Pain Score: 7  Pain Location: L hip Pain Descriptors / Indicators: Discomfort, Grimacing, Guarding Pain Intervention(s): Monitored during session (RN arriving at end of session to give pain meds)    Home Living Family/patient expects to be discharged to:: Private residence Living Arrangements: Children Available Help at Discharge: Family (daughter and son-in-law) Type of Home: House Home Access: Ramped entrance       Home Layout: One level Home Equipment: Rollator (4 wheels);Rolling Walker (2 wheels);Shower seat;BSC/3in1;Other (comment);Wheelchair - manual;Hand held shower head (adjustable bed) Additional Comments: pt reports daughter is an CHAR[employer] FUNDRAISER who works from home. grandson/granddaughter live nearby.    Prior Function            PT Goals (current goals can now be found in the care plan section) Acute Rehab PT Goals Patient Stated Goal: want to have less pain and move better PT Goal Formulation: With patient Time For Goal Achievement: 07/17/24 Potential to Achieve Goals: Good Progress towards PT goals: Progressing toward goals    Frequency    BID      PT Plan      Co-evaluation              AM-PAC PT 6 Clicks Mobility   Outcome Measure  Help needed turning from your back to your side while in a flat bed without using bedrails?: A Little Help needed moving from lying on your back to sitting on the side of a flat bed without using bedrails?: A Little Help needed moving to and from a bed to a chair (including a wheelchair)?: A Little Help needed standing up from a chair using your arms (e.g., wheelchair or bedside chair)?: A Little Help needed to walk in hospital room?: A Little Help needed climbing 3-5 steps with a railing? : A Lot 6 Click Score: 17    End  of Session Equipment Utilized During Treatment: Gait belt Activity Tolerance: Patient limited by fatigue;Patient limited by pain Patient left: in bed;with call bell/phone within reach;with bed alarm set;with family/visitor present;with nursing/sitter in room Nurse Communication: Mobility status PT Visit Diagnosis: Unsteadiness on feet (R26.81);Pain;Muscle weakness (generalized) (M62.81);Difficulty in walking, not elsewhere classified (R26.2) Pain - Right/Left: Left Pain - part of body: Hip     Time: 8565-8541 PT Time Calculation (min) (ACUTE ONLY): 24 min  Charges:    $Therapeutic Activity: 23-37 mins PT General Charges $$ ACUTE PT VISIT: 1 Visit                     Richerd Pinal, PT, DPT 07/04/24, 3:09 PM   Richerd CHRISTELLA Pinal 07/04/2024, 3:08 PM

## 2024-07-04 NOTE — NC FL2 (Signed)
 [place]  MEDICAID FL2 LEVEL OF CARE FORM     IDENTIFICATION  Patient Name: Melanie Carrillo Birthdate: [birth year] Sex: female Admission Date (Current Location): 07/02/2024  [facility] and [place] Number:  Producer, Television/film/video and Address:  [facility],  [address] 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 07/02/2024   Chronic hyponatremia 01/05/2024   Choledocholithiasis with chronic cholecystitis 04/28/2023   Localized osteoarthritis of left knee 03/22/2023   Calculus of gallbladder and bile duct with obstruction without cholecystitis 03/22/2023   Postmenopausal estrogen deficiency 11/26/2022   At risk for pill esophagitis 08/10/2022   Adjustment insomnia 07/21/2022   Other fatigue 07/21/2022   Loosening of intramedullary nail 04/27/2022   Degenerative disk disease 04/27/2022   Interstitial lung disease (HCC) 12/25/2021   Hyperlipidemia, mixed 09/07/2021   Obstructive sleep apnea 01/29/2014   GERD (gastroesophageal reflux disease) 12/26/2012   Depression 12/26/2012   Essential hypertension 11/07/2002    Orientation RESPIRATION BLADDER Height & Weight     Self, Time, Situation, Place  Normal Continent Weight: 148 lb (67.1 kg) Height:  5' 4 (162.6 cm)  BEHAVIORAL SYMPTOMS/MOOD NEUROLOGICAL BOWEL NUTRITION STATUS      Continent Diet (Regular diet)  AMBULATORY STATUS COMMUNICATION OF NEEDS Skin   Limited Assist Verbally Surgical wounds                       Personal Care Assistance Level of  Assistance  Bathing, Feeding, Dressing Bathing Assistance: Limited assistance Feeding assistance: Independent Dressing Assistance: Limited assistance     Functional Limitations Info  Sight, Hearing, Speech Sight Info: Impaired Hearing Info: Impaired Speech Info: Adequate    SPECIAL CARE FACTORS FREQUENCY  PT (By licensed PT), OT (By licensed OT)     PT Frequency: 5x's/week OT Frequency: 5x's/week            Contractures Contractures Info: Not present    Additional Factors Info  Code Status, Allergies, Psychotropic Code Status Info: Full Allergies Info: NKA Psychotropic Info: See discharge summary         Current Medications (07/04/2024):  This is the current hospital active medication list Current Facility-Administered Medications  Medication Dose Route Frequency Provider Last Rate Last Admin   acetaminophen  (TYLENOL ) tablet 325-650 mg  325-650 mg Oral Q6H PRN Cockerham, Alicia M, PA-C       aspirin  chewable tablet 81 mg  81 mg Oral BID Cockerham, Alicia M, PA-C   81 mg at 07/04/24 9065   brimonidine  (ALPHAGAN ) 0.2 % ophthalmic solution 1 drop  1 drop Both Eyes Q12H Cockerham, Alicia M, PA-C   1 drop at 07/04/24 0936   [START ON 07/05/2024] cefadroxil  (DURICEF) capsule 500 mg  500 mg Oral BID Cockerham, Alicia M, PA-C       ceFAZolin  (ANCEF ) IVPB 2g/100 mL premix  2 g Intravenous Q8H Cockerham, Alicia M, PA-C 200 mL/hr at 07/04/24 1410 2 g at 07/04/24 1410   diphenhydrAMINE (BENADRYL) 12.5 MG/5ML elixir 12.5-25 mg  12.5-25 mg Oral Q4H  PRN Cockerham, Alicia M, PA-C       docusate sodium  (COLACE) capsule 100 mg  100 mg Oral BID Cockerham, Alicia M, PA-C   100 mg at 07/04/24 0935   feeding supplement (ENSURE ENLIVE / ENSURE PLUS) liquid 237 mL  237 mL Oral BID BM Cockerham, Alicia M, PA-C   237 mL at 07/04/24 1409   HYDROmorphone (DILAUDID) injection 0.5-1 mg  0.5-1 mg Intravenous Q4H PRN Cockerham, Alicia M, PA-C   0.5 mg at 07/02/24 2237   lactated ringers  infusion    Intravenous Continuous Cockerham, Alicia M, PA-C 75 mL/hr at 07/04/24 0141 New Bag at 07/04/24 0141   latanoprost  (XALATAN ) 0.005 % ophthalmic solution 1 drop  1 drop Both Eyes QHS Cockerham, Alicia M, PA-C   1 drop at 07/03/24 2055   losartan  (COZAAR ) tablet 100 mg  100 mg Oral Daily Cockerham, Alicia M, PA-C   100 mg at 07/04/24 0932   menthol  (CEPACOL) lozenge 3 mg  1 lozenge Oral PRN Cockerham, Alicia M, PA-C       Or   phenol (CHLORASEPTIC) mouth spray 1 spray  1 spray Mouth/Throat PRN Cockerham, Alicia M, PA-C       methocarbamol (ROBAXIN) tablet 500 mg  500 mg Oral Q6H PRN Cockerham, Alicia M, PA-C   500 mg at 07/04/24 1342   Or   methocarbamol (ROBAXIN) injection 500 mg  500 mg Intravenous Q6H PRN Cockerham, Alicia M, PA-C       metoprolol  tartrate (LOPRESSOR ) tablet 12.5 mg  12.5 mg Oral BID Cockerham, Alicia M, PA-C   12.5 mg at 07/04/24 0932   montelukast  (SINGULAIR ) tablet 10 mg  10 mg Oral QHS Cockerham, Alicia M, PA-C   10 mg at 07/03/24 2052   ondansetron  (ZOFRAN ) tablet 4 mg  4 mg Oral Q6H PRN Cockerham, Alicia M, PA-C       Or   ondansetron  (ZOFRAN ) injection 4 mg  4 mg Intravenous Q6H PRN Cockerham, Alicia M, PA-C       oxyCODONE  (Oxy IR/ROXICODONE ) immediate release tablet 5-10 mg  5-10 mg Oral Q4H PRN Cockerham, Alicia M, PA-C   10 mg at 07/04/24 1459   pantoprazole  (PROTONIX ) EC tablet 40 mg  40 mg Oral Daily Cockerham, Alicia M, PA-C   40 mg at 07/04/24 0935   polyethylene glycol (MIRALAX  / GLYCOLAX ) packet 17 g  17 g Oral Daily PRN Cockerham, Alicia M, PA-C       pravastatin  (PRAVACHOL ) tablet 40 mg  40 mg Oral q1800 Cockerham, Alicia M, PA-C   40 mg at 07/03/24 1835   senna (SENOKOT) tablet 8.6 mg  1 tablet Oral BID Cockerham, Alicia M, PA-C   8.6 mg at 07/04/24 0935   sertraline  (ZOLOFT ) tablet 50 mg  50 mg Oral Daily Cockerham, Alicia M, PA-C   50 mg at 07/04/24 0935   tiZANidine  (ZANAFLEX ) tablet 2-6 mg  2-6 mg Oral Q8H PRN Cockerham, Alicia M, PA-C   4 mg at 07/03/24  1054   traZODone  (DESYREL ) tablet 50 mg  50 mg Oral QHS Cockerham, Alicia M, PA-C   50 mg at 07/03/24 2052     Discharge Medications: Please see discharge summary for a list of discharge medications.  Relevant Imaging Results:  Relevant Lab Results:   Additional Information SSN: [id number]  Duwaine GORMAN Aran, LCSW

## 2024-07-05 ENCOUNTER — Encounter (HOSPITAL_COMMUNITY): Payer: Self-pay | Admitting: Orthopedic Surgery

## 2024-07-05 LAB — CK: Total CK: 137 U/L (ref 38–234)

## 2024-07-05 MED ORDER — OXYCODONE HCL 5 MG PO TABS: 5.0000 mg | ORAL_TABLET | ORAL | 0 refills | Status: AC | PRN

## 2024-07-05 MED ORDER — BRIMONIDINE TARTRATE 0.2 % OP SOLN
1.0000 [drp] | Freq: Two times a day (BID) | OPHTHALMIC | Status: DC
  Administered 2024-07-05 – 2024-07-09 (×8): 1 [drp] via OPHTHALMIC
  Filled 2024-07-05: qty 5

## 2024-07-05 MED ORDER — DORZOLAMIDE HCL-TIMOLOL MAL 2-0.5 % OP SOLN
1.0000 [drp] | Freq: Two times a day (BID) | OPHTHALMIC | Status: AC
  Administered 2024-07-05: 1 [drp] via OPHTHALMIC
  Filled 2024-07-05: qty 10

## 2024-07-05 MED ORDER — DORZOLAMIDE HCL-TIMOLOL MAL 2-0.5 % OP SOLN
1.0000 [drp] | Freq: Two times a day (BID) | OPHTHALMIC | Status: DC
  Administered 2024-07-05 – 2024-07-09 (×8): 1 [drp] via OPHTHALMIC
  Filled 2024-07-05: qty 10

## 2024-07-05 MED ORDER — DAPTOMYCIN-SODIUM CHLORIDE 500-0.9 MG/50ML-% IV SOLN
8.0000 mg/kg | Freq: Every day | INTRAVENOUS | Status: DC
  Administered 2024-07-05 – 2024-07-08 (×4): 500 mg via INTRAVENOUS
  Filled 2024-07-05 (×5): qty 50

## 2024-07-05 NOTE — Progress Notes (Signed)
 Physical Therapy Treatment Patient Details Name: Melanie Carrillo MRN: [id number] DOB: 08-Jun-1935 Today's Date: 07/05/2024   History of Present Illness Pt is an 89y.o. 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 PMH significant for, but not limited to: anxiety, depression, colon cancer, DDD, and GERD.    PT Comments  On arrival to room, pt requesting use of BSC.  Pt assisted from bed to [facility] and up to ambulate limited distance in hall.  Pt up in recliner and positioned for comfort on exit from room.   If plan is discharge home, recommend the following: A little help with walking and/or transfers;A lot of help with bathing/dressing/bathroom;Assistance with cooking/housework;Help with stairs or ramp for entrance;Assist for transportation   Can travel by private vehicle        Equipment Recommendations  None recommended by PT    Recommendations for Other Services OT consult     Precautions / Restrictions Precautions Precautions: Fall;Posterior Hip Precaution Booklet Issued: Yes (comment) Recall of Precautions/Restrictions: Impaired Precaution/Restrictions Comments: LLE wound vac Restrictions Weight Bearing Restrictions Per Provider Order: Yes LLE Weight Bearing Per Provider Order: Weight bearing as tolerated     Mobility  Bed Mobility Overal bed mobility: Needs Assistance Bed Mobility: Supine to Sit     Supine to sit: Min assist     General bed mobility comments: cues for sequence, adherence to THP and use of R LE to self assist.    Transfers Overall transfer level: Needs assistance Equipment used: Rolling walker (2 wheels) Transfers: Sit to/from Stand, Bed to chair/wheelchair/BSC Sit to Stand: Min assist   Step pivot transfers: Min assist       General transfer comment: cues for LE management, use of UEs to self assist and adherence to THP.  Physical assist to bring wt  up and fwd and to balance in standing with RW.  Step pvt bed to [facility]    Ambulation/Gait Ambulation/Gait assistance: Contact guard assist Gait Distance (Feet): 49 Feet Assistive device: Rolling walker (2 wheels) Gait Pattern/deviations: Decreased step length - right, Decreased step length - left, Decreased stride length, Decreased dorsiflexion - right, Decreased dorsiflexion - left, Trunk flexed Gait velocity: decreased     General Gait Details: cues for sequence, posture and position from [employer]             Wheelchair Mobility     Tilt Bed    Modified Rankin (Stroke Patients Only)       Balance Overall balance assessment: Needs assistance Sitting-balance support: No upper extremity supported, Feet supported Sitting balance-Leahy Scale: Good     Standing balance support: Bilateral upper extremity supported, During functional activity, Reliant on assistive device for balance Standing balance-Leahy Scale: Poor                              Communication Communication Communication: Impaired Factors Affecting Communication: Hearing impaired  Cognition Arousal: Alert Behavior During Therapy: Flat affect   PT - Cognitive impairments: No apparent impairments                         Following commands: Intact      Cueing Cueing Techniques: Verbal cues, Visual cues  Exercises      General Comments        Pertinent Vitals/Pain Pain Assessment Pain Assessment: 0-10 Pain Score: 6  Pain Location: L hip Pain Descriptors / Indicators: Discomfort, Grimacing, Guarding Pain Intervention(s): Limited activity within patient's tolerance, Monitored during session, Premedicated before session    Home Living                          Prior Function            PT Goals (current goals can now be found in the care plan section) Acute Rehab PT Goals Patient Stated Goal: want to have less pain and move better PT Goal Formulation:  With patient Time For Goal Achievement: 07/17/24 Potential to Achieve Goals: Good Progress towards PT goals: Progressing toward goals    Frequency    7X/week      PT Plan      Co-evaluation              AM-PAC PT 6 Clicks Mobility   Outcome Measure  Help needed turning from your back to your side while in a flat bed without using bedrails?: A Little Help needed moving from lying on your back to sitting on the side of a flat bed without using bedrails?: A Little Help needed moving to and from a bed to a chair (including a wheelchair)?: A Little Help needed standing up from a chair using your arms (e.g., wheelchair or bedside chair)?: A Little Help needed to walk in hospital room?: A Little Help needed climbing 3-5 steps with a railing? : A Lot 6 Click Score: 17    End of Session Equipment Utilized During Treatment: Gait belt Activity Tolerance: Patient tolerated treatment well Patient left: in chair;with call bell/phone within reach;with chair alarm set;with nursing/sitter in room Nurse Communication: Mobility status PT Visit Diagnosis: Unsteadiness on feet (R26.81);Pain;Muscle weakness (generalized) (M62.81);Difficulty in walking, not elsewhere classified (R26.2) Pain - Right/Left: Left Pain - part of body: Hip     Time: 1120-1150 PT Time Calculation (min) (ACUTE ONLY): 30 min  Charges:    $Gait Training: 8-22 mins $Therapeutic Activity: 8-22 mins PT General Charges $$ ACUTE PT VISIT: 1 Visit                     [facility] PT Acute Rehabilitation Services Office [phone]    Cyle Kenyon 07/05/2024, 3:11 PM

## 2024-07-05 NOTE — Plan of Care (Signed)
  Problem: Education: Goal: Knowledge of General Education information will improve Description: Including pain rating scale, medication(s)/side effects and non-pharmacologic comfort measures Outcome: Progressing   Problem: Health Behavior/Discharge Planning: Goal: Ability to manage health-related needs will improve Outcome: Progressing   Problem: Clinical Measurements: Goal: Ability to maintain clinical measurements within normal limits will improve Outcome: Progressing   Problem: Activity: Goal: Risk for activity intolerance will decrease Outcome: Not Progressing   Problem: Coping: Goal: Level of anxiety will decrease Outcome: Progressing

## 2024-07-05 NOTE — Progress Notes (Signed)
     Subjective: Patient reports pain as moderate.  Working well with physical therapy.  Mobilize 30 feet.  Cultures no growth to date.  Given slow progress with therapy, patient's age, extensive surgery anticipate likely SNF for discharge.  Yesterday's total administered Morphine  Milligram Equivalents: 60   Objective:   VITALS:   Vitals:   07/04/24 0932 07/04/24 1315 07/04/24 2214 07/05/24 0549  BP: (!) 144/91 122/71 (!) 142/58 (!) 152/62  Pulse: (!) 106 (!) 102 89 85  Resp:  16 16 18   Temp:  98.6 F (37 C) 98.2 F (36.8 C) 98 F (36.7 C)  TempSrc:  Oral Oral Oral  SpO2: 91% (!) 76% 91% 90%  Weight:      Height:        Sitting in bed in NAD Observed moving from standing position at bedside commode back to sitting/laying on bed with help of assist Sensation intact distally Intact pulses distally Dorsiflexion/Plantar flexion intact Incision: dressing C/D/I Compartment soft Wound vac in place maintaining good seal, no fluid collected in cannister Wiggles toes appropriately    Lab Results  Component Value Date   WBC 11.3 (H) 07/04/2024   HGB 8.0 (L) 07/04/2024   HCT 24.4 (L) 07/04/2024   MCV 106.6 (H) 07/04/2024   PLT 207 07/04/2024   BMET    Component Value Date/Time   NA 131 (L) 07/03/2024 0539   NA 136 03/26/2024 1614   K 4.4 07/03/2024 0539   CL 103 07/03/2024 0539   CO2 23 07/03/2024 0539   GLUCOSE 144 (H) 07/03/2024 0539   BUN 8 07/03/2024 0539   BUN 12 03/26/2024 1614   CREATININE 0.61 07/03/2024 0539   CALCIUM 8.7 (L) 07/03/2024 0539   EGFR 78 03/26/2024 1614   GFRNONAA >60 07/03/2024 0539      Xray: Total hip arthroplasty components with proximal femoral replacement in good alignment no adverse features  Assessment/Plan: 3 Days Post-Op   Principal Problem:   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 NGTD.  If cultures positive will consult infectious disease for extended course of IV antibiotics Imaging: PACU pelvis Xray Dressing: prevena wound vac, keep intact until follow up DVT prophylaxis: Aspirin  81BID starting POD1 Follow up: 1 weeks after surgery for a wound check with Dr[facility] Edna at [facility].  Address: [address], [place], [place] [address]  Office Phone: [phone]    TORIBIO DELENA EDNA 07/05/2024, 6:52 AM    Contact information:   Weekdays 7am-5pm epic message Dr. Edna, or call office for patient follow up: [phone] After hours and holidays please check [email or web address] for group call information for [facility]

## 2024-07-05 NOTE — TOC Progression Note (Signed)
 Transition of Care [facility]) - Progression Note    Patient Details  Name: Melanie Carrillo MRN: [id number] Date of Birth: 1934/12/29  [facility] CM/SW Contact  Alfonse JONELLE Rex, RN Phone Number: 07/05/2024, 10:06 AM  Clinical Narrative:   SNF auth initiated via [employer]. Certification Number [id number]  Status CERTIFIED IN TOTAL Service Type Code 1 AG - Skilled Nursing Care Start Date - End Date 2024-07-05 - 2024-07-11    Expected Discharge Plan: Skilled Nursing Facility Barriers to Discharge: SNF Pending bed offer, Insurance Authorization               Expected Discharge Plan and Services     Post Acute Care Choice: Skilled Nursing Facility                                         Social Drivers of Health (SDOH) Interventions SDOH Screenings   Food Insecurity: No Food Insecurity (07/02/2024)  Housing: Low Risk  (07/02/2024)  Transportation Needs: No Transportation Needs (07/02/2024)  Utilities: Not At Risk (07/02/2024)  Alcohol Screen: Low Risk  (11/26/2022)  Depression (PHQ2-9): Low Risk  (04/12/2024)  Financial Resource Strain: Low Risk  (03/22/2024)  Physical Activity: Inactive (03/22/2024)  Social Connections: Unknown (07/02/2024)  Stress: No Stress Concern Present (03/22/2024)  Tobacco Use: Low Risk  (07/02/2024)  Health Literacy: Inadequate Health Literacy (02/20/2024)    Readmission Risk Interventions    07/03/2024   12:01 PM  Readmission Risk Prevention Plan  Post Dischage Appt Complete  Medication Screening Complete  Transportation Screening Complete

## 2024-07-05 NOTE — Progress Notes (Signed)
 Pharmacy Antibiotic Note  Melanie Carrillo is a 89 y.o. female admitted on 07/02/2024 with L-femur nonunion and r/o infection. Pharmacy has been consulted for Daptomycin dosing.  Noted 3 tissue cultures sent with gram stain showing GPC with culture reincubating for better growth. Discussed with ortho and plan to cover with Daptomycin for now pending culture updates.   Plan: - Start Daptomycin 500 mg IV every 24 hours - Baseline CK check then weekly while on Dapto - Will follow-up on cultures results and ortho plans  Height: 5' 4 (162.6 cm) Weight: 67.1 kg (148 lb) IBW/kg (Calculated) : 54.7  Temp (24hrs), Avg:98.3 F (36.8 C), Min:98 F (36.7 C), Max:98.6 F (37 C)  Recent Labs  Lab 07/03/24 0539 07/04/24 0528  WBC 13.8* 11.3*  CREATININE 0.61  --     Estimated Creatinine Clearance: 46.7 mL/min (by C-G formula based on SCr of 0.61 mg/dL).    No Known Allergies  Antimicrobials this admission: Cefazolin  pre/post-op 11/10 - 11/13 Daptomycin 11/13 >>  Microbiology results: 11/10 L-hip tissue x 2 >> GPC on gram stain >> reincubating 11/10 L-hip tissue/bone >> GPR/GPC on gram stain 11/10 AFB smear x 3 >> neg  Thank you for allowing pharmacy to be a part of this patient's care.  Almarie Lunger, PharmD, BCPS, BCIDP Infectious Diseases Clinical Pharmacist 07/05/2024 8:52 AM   **Pharmacist phone directory can now be found on [email or web address] ([email or web address]).  Listed under [facility].

## 2024-07-05 NOTE — Progress Notes (Signed)
 Physical Therapy Treatment Patient Details Name: Melanie Carrillo MRN: [id number] DOB: Jul 19, 1935 Today's Date: 07/05/2024   History of Present Illness Pt is an 89y.o. 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 PMH significant for, but not limited to: anxiety, depression, colon cancer, DDD, and GERD.    PT Comments  Pt continues cooperative but limited this pm by increased pain with activity.  Pt assisted from recliner to [facility] and ambulated limited distance to return to bed. Pt positioned for comfort and RN alerted to pt request for pain meds.     If plan is discharge home, recommend the following: A little help with walking and/or transfers;A lot of help with bathing/dressing/bathroom;Assistance with cooking/housework;Help with stairs or ramp for entrance;Assist for transportation   Can travel by private vehicle        Equipment Recommendations  None recommended by PT    Recommendations for Other Services OT consult     Precautions / Restrictions Precautions Precautions: Fall;Posterior Hip Precaution Booklet Issued: Yes (comment) Recall of Precautions/Restrictions: Impaired Precaution/Restrictions Comments: LLE wound vac Restrictions Weight Bearing Restrictions Per Provider Order: Yes LLE Weight Bearing Per Provider Order: Weight bearing as tolerated     Mobility  Bed Mobility Overal bed mobility: Needs Assistance Bed Mobility: Sit to Supine     Supine to sit: Min assist Sit to supine: Min assist   General bed mobility comments: cues for sequence, adherence to THP and use of R LE to self assist.    Transfers Overall transfer level: Needs assistance Equipment used: Rolling walker (2 wheels) Transfers: Sit to/from Stand, Bed to chair/wheelchair/BSC Sit to Stand: Min assist   Step pivot transfers: Min assist       General transfer comment: cues for LE management, use of  UEs to self assist and adherence to THP.  Physical assist to bring wt up and fwd and to balance in standing with RW.  Step pvt recliner to [facility] to bedside    Ambulation/Gait Ambulation/Gait assistance: Contact guard assist Gait Distance (Feet): 5 Feet Assistive device: Rolling walker (2 wheels) Gait Pattern/deviations: Decreased step length - right, Decreased step length - left, Decreased stride length, Decreased dorsiflexion - right, Decreased dorsiflexion - left, Trunk flexed Gait velocity: decreased     General Gait Details: cues for sequence, posture and position from [employer]             Wheelchair Mobility     Tilt Bed    Modified Rankin (Stroke Patients Only)       Balance Overall balance assessment: Needs assistance Sitting-balance support: No upper extremity supported, Feet supported Sitting balance-Leahy Scale: Good     Standing balance support: Bilateral upper extremity supported, During functional activity, Reliant on assistive device for balance Standing balance-Leahy Scale: Poor                              Communication Communication Communication: Impaired Factors Affecting Communication: Hearing impaired  Cognition Arousal: Alert Behavior During Therapy: Flat affect   PT - Cognitive impairments: No apparent impairments                         Following commands: Intact      Cueing Cueing Techniques: Verbal cues, Visual cues  Exercises      General Comments        Pertinent  Vitals/Pain Pain Assessment Pain Assessment: 0-10 Pain Score: 8  Pain Location: L hip Pain Descriptors / Indicators: Discomfort, Grimacing, Guarding Pain Intervention(s): Limited activity within patient's tolerance, Monitored during session, Patient requesting pain meds-RN notified, Ice applied    Home Living                          Prior Function            PT Goals (current goals can now be found in the care plan  section) Acute Rehab PT Goals Patient Stated Goal: want to have less pain and move better PT Goal Formulation: With patient Time For Goal Achievement: 07/17/24 Potential to Achieve Goals: Good Progress towards PT goals: Progressing toward goals    Frequency    7X/week      PT Plan      Co-evaluation              AM-PAC PT 6 Clicks Mobility   Outcome Measure  Help needed turning from your back to your side while in a flat bed without using bedrails?: A Little Help needed moving from lying on your back to sitting on the side of a flat bed without using bedrails?: A Little Help needed moving to and from a bed to a chair (including a wheelchair)?: A Little Help needed standing up from a chair using your arms (e.g., wheelchair or bedside chair)?: A Little Help needed to walk in hospital room?: A Little Help needed climbing 3-5 steps with a railing? : A Lot 6 Click Score: 17    End of Session Equipment Utilized During Treatment: Gait belt Activity Tolerance: Patient limited by pain Patient left: in bed;with call bell/phone within reach;with bed alarm set;with nursing/sitter in room Nurse Communication: Mobility status PT Visit Diagnosis: Unsteadiness on feet (R26.81);Pain;Muscle weakness (generalized) (M62.81);Difficulty in walking, not elsewhere classified (R26.2) Pain - Right/Left: Left Pain - part of body: Hip     Time: 1355-1420 PT Time Calculation (min) (ACUTE ONLY): 25 min  Charges:    $Gait Training: 8-22 mins $Therapeutic Activity: 8-22 mins PT General Charges $$ ACUTE PT VISIT: 1 Visit                     [facility] PT Acute Rehabilitation Services Office [phone]    Ireland Virrueta 07/05/2024, 3:20 PM

## 2024-07-06 ENCOUNTER — Other Ambulatory Visit: Payer: Self-pay

## 2024-07-06 DIAGNOSIS — D62 Acute posthemorrhagic anemia: Secondary | ICD-10-CM | POA: Diagnosis not present

## 2024-07-06 DIAGNOSIS — T8452XA Infection and inflammatory reaction due to internal left hip prosthesis, initial encounter: Secondary | ICD-10-CM | POA: Diagnosis not present

## 2024-07-06 LAB — BASIC METABOLIC PANEL WITH GFR
Anion gap: 8 (ref 5–15)
BUN: 8 mg/dL (ref 8–23)
CO2: 25 mmol/L (ref 22–32)
Calcium: 9.3 mg/dL (ref 8.9–10.3)
Chloride: 96 mmol/L — ABNORMAL LOW (ref 98–111)
Creatinine, Ser: 0.43 mg/dL — ABNORMAL LOW (ref 0.44–1.00)
GFR, Estimated: >60 mL/min (ref >60)
Glucose, Bld: 105 mg/dL — ABNORMAL HIGH (ref 70–99)
Potassium: 4.2 mmol/L (ref 3.5–5.1)
Sodium: 129 mmol/L — ABNORMAL LOW (ref 135–145)

## 2024-07-06 LAB — SEDIMENTATION RATE: Sed Rate: 115 mm/h — ABNORMAL HIGH (ref 0–22)

## 2024-07-06 LAB — CBC
HCT: 20.6 % — ABNORMAL LOW (ref 36.0–46.0)
Hemoglobin: 6.8 g/dL — CL (ref 12.0–15.0)
MCH: 35.8 pg — ABNORMAL HIGH (ref 26.0–34.0)
MCHC: 33 g/dL (ref 30.0–36.0)
MCV: 108.4 fL — ABNORMAL HIGH (ref 80.0–100.0)
Platelets: 248 K/uL (ref 150–400)
RBC: 1.9 MIL/uL — ABNORMAL LOW (ref 3.87–5.11)
RDW: 14.7 % (ref 11.5–15.5)
WBC: 8.6 K/uL (ref 4.0–10.5)
nRBC: 0.5 % — ABNORMAL HIGH (ref 0.0–0.2)

## 2024-07-06 LAB — C-REACTIVE PROTEIN: CRP: 15.7 mg/dL — ABNORMAL HIGH (ref <1.0)

## 2024-07-06 LAB — HEMOGLOBIN AND HEMATOCRIT, BLOOD
HCT: 22.8 % — ABNORMAL LOW (ref 36.0–46.0)
Hemoglobin: 7.6 g/dL — ABNORMAL LOW (ref 12.0–15.0)

## 2024-07-06 LAB — PREPARE RBC (CROSSMATCH)

## 2024-07-06 MED ORDER — CHLORHEXIDINE GLUCONATE CLOTH 2 % EX PADS
6.0000 | MEDICATED_PAD | Freq: Every day | CUTANEOUS | Status: DC
  Administered 2024-07-06 – 2024-07-09 (×4): 6 via TOPICAL

## 2024-07-06 MED ORDER — SODIUM CHLORIDE 0.9% IV SOLUTION: Freq: Once | INTRAVENOUS | Status: AC

## 2024-07-06 MED ORDER — SODIUM CHLORIDE 0.9% FLUSH: 10.0000 mL | INTRAVENOUS | Status: DC | PRN

## 2024-07-06 MED ORDER — ADULT MULTIVITAMIN W/MINERALS CH
1.0000 | ORAL_TABLET | Freq: Every day | ORAL | Status: DC
  Administered 2024-07-06 – 2024-07-09 (×4): 1 via ORAL
  Filled 2024-07-06 (×4): qty 1

## 2024-07-06 NOTE — Progress Notes (Signed)
    Patient's attending provider is currently in the OR and unreachable so I was contacted due to low Hgb of 6.8. I placed an order for STAT type & cross and 1 unit PRBCs followed by post transfusion H/H order. I'm available for additional needs.    Gerard CHRISTELLA Large, PA-C Office [phone] 07/06/2024, 11:09 AM

## 2024-07-06 NOTE — Progress Notes (Signed)
     Subjective: Patient reports pain as improving.  Has been able to mobilize yesterday out into the hall for some steps as well as to the bedside commode.  She is pleased with her progress.  Discussed unexpected positive cultures from IntraOp.  Spoke with Dr. Dea with infectious disease yesterday who will consult on the patient today.  Anticipate patient will may need extended course of IV antibiotics and PICC line prior to discharge to nursing facility.  Yesterday's total administered Morphine  Milligram Equivalents: 30   Objective:   VITALS:   Vitals:   07/05/24 0549 07/05/24 1421 07/05/24 2037 07/06/24 0621  BP: (!) 152/62 (!) 151/58 (!) 148/47 110/70  Pulse: 85 82 90 81  Resp: 18 16 18 18   Temp: 98 F (36.7 C) 98.2 F (36.8 C) 98.5 F (36.9 C) (!) 97.4 F (36.3 C)  TempSrc: Oral Oral Oral Oral  SpO2: 90% 94% 93% 92%  Weight:      Height:        Sitting in bed in NAD Observed moving from standing position at bedside commode back to sitting/laying on bed with help of assist Sensation intact distally Intact pulses distally Dorsiflexion/Plantar flexion intact Incision: dressing C/D/I Compartment soft Wound vac in place maintaining good seal, no fluid collected in cannister Wiggles toes appropriately    Lab Results  Component Value Date   WBC 11.3 (H) 07/04/2024   HGB 8.0 (L) 07/04/2024   HCT 24.4 (L) 07/04/2024   MCV 106.6 (H) 07/04/2024   PLT 207 07/04/2024   BMET    Component Value Date/Time   NA 131 (L) 07/03/2024 0539   NA 136 03/26/2024 1614   K 4.4 07/03/2024 0539   CL 103 07/03/2024 0539   CO2 23 07/03/2024 0539   GLUCOSE 144 (H) 07/03/2024 0539   BUN 8 07/03/2024 0539   BUN 12 03/26/2024 1614   CREATININE 0.61 07/03/2024 0539   CALCIUM 8.7 (L) 07/03/2024 0539   EGFR 78 03/26/2024 1614   GFRNONAA >60 07/03/2024 0539      Xray: Total hip arthroplasty components with proximal femoral replacement in good alignment no adverse  features  Assessment/Plan: 4 Days Post-Op   Principal Problem:   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 NGTD.  IntraOp cultures positive for Staph epidermidis, pending infection disease consult for recs prior to discharge. Imaging: PACU pelvis Xray Dressing: prevena wound vac, keep intact until follow up DVT prophylaxis: Aspirin  81BID starting POD1 Follow up: 1 weeks after surgery for a wound check with Dr. Edna at [facility].  Address: [address], [place], [place] [address]  Office Phone: [phone]    TORIBIO DELENA EDNA 07/06/2024, 6:47 AM    Contact information:   Weekdays 7am-5pm epic message Dr. Edna, or call office for patient follow up: [phone] After hours and holidays please check [email or web address] for group call information for [facility]

## 2024-07-06 NOTE — Progress Notes (Signed)
 PT Cancellation Note  Patient Details Name: Melanie Carrillo MRN: [id number] DOB: 1935-05-01   Cancelled Treatment:    Reason Eval/Treat Not Completed: Medical issues which prohibited therapy. PT returned at 1604 and pt receiving transfusion. PT to continue to follow acutely.   [place], PT Acute Rehab   [place] VEAR Drone 07/06/2024, 4:06 PM

## 2024-07-06 NOTE — Progress Notes (Signed)
 Physical Therapy Treatment Patient Details Name: Melanie Carrillo MRN: [id number] DOB: May 14, 1935 Today's Date: 07/06/2024   History of Present Illness Pt is an 89y.o. 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 PMH significant for, but not limited to: anxiety, depression, colon cancer, DDD, and GERD.    PT Comments   Pt admitted with above diagnosis.  Pt currently with functional limitations due to the deficits listed below (see PT Problem List). Pt in bed when therapist arrived. Pt HgB 6.8 and awaiting transfusion however agreeable to PT. Pt reports anticipated d/c to SNF for rehab on Saturday. PT strongly encouraged pt to perform ankle pumps x 10-20 per hr while awake, guided pt through L heel slides with AA and limited ROM in knee and hip as well as provided ed on posterior hip precautions pt able to recall 2/3-- no crossing LE.  Pt required min A and cues with use of hospital bed features for supine to sit, CGA for sit to stand  from a variety of surfaces with use of RW and cues, gait tasks in hallway 70 feet with CGA and cues for safety, posture and gait pattern with pt demonstrating signs and symptoms of SOB and at end of gait bout pt O2 saturation 89% on RA and pt quickly recovered 92% with seated therapeutic rest break, pt requested to use bathroom and PT placed elevated commode seat over standard commode in bathroom pt able to amb to and from commode <> recliner with CGA and cues with RW, perform retrograde and side stepping patterns required for proper alignment with commode and maintain static standing no UE support for handwashing at sink with no observed overt LOB. Pt left seated in recliner, all needs in place and CP to L LE. Patient will benefit from continued inpatient follow up therapy, <3 hours/day.  Pt will benefit from acute skilled PT to increase their independence and safety with mobility  to allow discharge.      If plan is discharge home, recommend the following: A little help with walking and/or transfers;A lot of help with bathing/dressing/bathroom;Assistance with cooking/housework;Help with stairs or ramp for entrance;Assist for transportation   Can travel by private vehicle        Equipment Recommendations  None recommended by PT    Recommendations for Other Services OT consult     Precautions / Restrictions Precautions Precautions: Fall;Posterior Hip Precaution Booklet Issued: Yes (comment) Recall of Precautions/Restrictions: Impaired Precaution/Restrictions Comments: LLE wound vac Restrictions Weight Bearing Restrictions Per Provider Order: Yes LLE Weight Bearing Per Provider Order: Weight bearing as tolerated Other Position/Activity Restrictions: WBAT     Mobility  Bed Mobility Overal bed mobility: Needs Assistance Bed Mobility: Sit to Supine     Supine to sit: Min assist     General bed mobility comments: cues for sequence and hip precautions    Transfers Overall transfer level: Needs assistance Equipment used: Rolling walker (2 wheels) Transfers: Sit to/from Stand Sit to Stand: Contact guard assist           General transfer comment: cues for safety and technique with proper UE and AD placement for bed, recliner and commode transfers    Ambulation/Gait Ambulation/Gait assistance: Contact guard assist Gait Distance (Feet): 70 Feet Assistive device: Rolling walker (2 wheels) Gait Pattern/deviations: Decreased dorsiflexion - right, Decreased dorsiflexion - left, Trunk flexed, Step-to pattern, Decreased stance time - left Gait velocity: decreased     General Gait  Details: cues for sequence, posture and position from RW, gait in personal room recliner <> commode and in hallway, PT initiated instruction for retrograde stepping patterns and side stepping with mulitmodal cues and occational min A for RW management   Stairs              Wheelchair Mobility     Tilt Bed    Modified Rankin (Stroke Patients Only)       Balance Overall balance assessment: Needs assistance Sitting-balance support: No upper extremity supported, Feet supported Sitting balance-Leahy Scale: Good     Standing balance support: Bilateral upper extremity supported, During functional activity, Reliant on assistive device for balance Standing balance-Leahy Scale: Fair Standing balance comment: static standing no UE support and no overt LOB, dynamic tasks with Rw and B UE support                            Communication Communication Communication: Impaired Factors Affecting Communication: Hearing impaired  Cognition Arousal: Alert Behavior During Therapy: WFL for tasks assessed/performed   PT - Cognitive impairments: No apparent impairments                         Following commands: Intact      Cueing Cueing Techniques: Verbal cues, Visual cues, Tactile cues, Gestural cues  Exercises Total Joint Exercises Ankle Circles/Pumps: AROM, Both, 10 reps, Supine Heel Slides: AAROM, Left, 5 reps    General Comments        Pertinent Vitals/Pain Pain Assessment Pain Assessment: 0-10 Pain Score: 6  Pain Location: L hip Pain Descriptors / Indicators: Discomfort, Grimacing, Guarding Pain Intervention(s): Limited activity within patient's tolerance, Monitored during session, Premedicated before session, Repositioned, Ice applied    Home Living                          Prior Function            PT Goals (current goals can now be found in the care plan section) Acute Rehab PT Goals Patient Stated Goal: want to have less pain and move better PT Goal Formulation: With patient Time For Goal Achievement: 07/17/24 Potential to Achieve Goals: Good Progress towards PT goals: Progressing toward goals    Frequency    7X/week      PT Plan      Co-evaluation              AM-PAC PT 6  Clicks Mobility   Outcome Measure  Help needed turning from your back to your side while in a flat bed without using bedrails?: A Little Help needed moving from lying on your back to sitting on the side of a flat bed without using bedrails?: A Little Help needed moving to and from a bed to a chair (including a wheelchair)?: A Little Help needed standing up from a chair using your arms (e.g., wheelchair or bedside chair)?: A Little Help needed to walk in hospital room?: A Little Help needed climbing 3-5 steps with a railing? : A Lot 6 Click Score: 17    End of Session Equipment Utilized During Treatment: Gait belt Activity Tolerance: Patient limited by fatigue Patient left: with call bell/phone within reach;in chair;with chair alarm set Nurse Communication: Mobility status PT Visit Diagnosis: Unsteadiness on feet (R26.81);Pain;Muscle weakness (generalized) (M62.81);Difficulty in walking, not elsewhere classified (R26.2) Pain - Right/Left: Left Pain - part of body:  Hip     Time: 8874-8840 PT Time Calculation (min) (ACUTE ONLY): 34 min  Charges:    $Gait Training: 8-22 mins $Therapeutic Activity: 8-22 mins PT General Charges $$ ACUTE PT VISIT: 1 Visit                     [place], PT Acute Rehab    [place] VEAR Drone 07/06/2024, 12:44 PM

## 2024-07-06 NOTE — Progress Notes (Signed)
 Peripherally Inserted Central Catheter Placement  The IV Nurse has discussed with the patient and/or persons authorized to consent for the patient, the purpose of this procedure and the potential benefits and risks involved with this procedure.  The benefits include less needle sticks, lab draws from the catheter, and the patient may be discharged home with the catheter. Risks include, but not limited to, infection, bleeding, blood clot (thrombus formation), and puncture of an artery; nerve damage and irregular heartbeat and possibility to perform a PICC exchange if needed/ordered by physician.  Alternatives to this procedure were also discussed.  Bard Power PICC patient education guide, fact sheet on infection prevention and patient information card has been provided to patient /or left at bedside.  Telephone consent obtained from daughter.  PICC Placement Documentation  PICC Single Lumen 07/06/24 Right Basilic 35 cm 0 cm (Active)  Indication for Insertion or Continuance of Line Prolonged intravenous therapies 07/06/24 1831  Exposed Catheter (cm) 0 cm 07/06/24 1831  Site Assessment Clean, Dry, Intact 07/06/24 1831  Line Status Flushed;Blood return noted;Saline locked 07/06/24 1831  Dressing Type Transparent;Securing device 07/06/24 1831  Dressing Status Antimicrobial disc/dressing in place;Clean, Dry, Intact 07/06/24 1831  Line Care Connections checked and tightened 07/06/24 1831  Line Adjustment (NICU/IV Team Only) No 07/06/24 1831  Dressing Intervention New dressing;Adhesive placed at insertion site (IV team only) 07/06/24 1831  Dressing Change Due 07/13/24 07/06/24 1831       Araiya Tilmon, [place]rene Place 07/06/2024, 6:32 PM

## 2024-07-06 NOTE — TOC Progression Note (Signed)
 Transition of Care [facility]) - Progression Note    Patient Details  Name: Melanie Carrillo MRN: [id number] Date of Birth: June 09, 1935  [facility] CM/SW Contact  Alfonse JONELLE Rex, RN Phone Number: 07/06/2024, 12:25 PM  Clinical Narrative:   Receiving blood transfusion today. Will follow for dc needs.     Expected Discharge Plan: Skilled Nursing Facility Barriers to Discharge: SNF Pending bed offer, Insurance Authorization               Expected Discharge Plan and Services     Post Acute Care Choice: Skilled Nursing Facility                                         Social Drivers of Health (SDOH) Interventions SDOH Screenings   Food Insecurity: No Food Insecurity (07/02/2024)  Housing: Low Risk  (07/02/2024)  Transportation Needs: No Transportation Needs (07/02/2024)  Utilities: Not At Risk (07/02/2024)  Alcohol Screen: Low Risk  (11/26/2022)  Depression (PHQ2-9): Low Risk  (04/12/2024)  Financial Resource Strain: Low Risk  (03/22/2024)  Physical Activity: Inactive (03/22/2024)  Social Connections: Unknown (07/02/2024)  Stress: No Stress Concern Present (03/22/2024)  Tobacco Use: Low Risk  (07/02/2024)  Health Literacy: Inadequate Health Literacy (02/20/2024)    Readmission Risk Interventions    07/03/2024   12:01 PM  Readmission Risk Prevention Plan  Post Dischage Appt Complete  Medication Screening Complete  Transportation Screening Complete

## 2024-07-06 NOTE — Consult Note (Addendum)
 [facility]                                                                                        Patient Identification: Patient Name: Melanie Carrillo MRN: [id number] Admit Date: 07/02/2024 10:52 AM Today's Date: 07/06/2024 Reason for consult: Prosthetic joint infection Requesting provider: Dr. 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  Successful removal of the intramedullary nail, 1 distal broken distal interlock screw left.  Successful implantation of total hip arthroplasty. All hardware out except distal interlock screw embedded in bone per Dr Edna. OR culture Staph epidermidis 2 out of 3 samples.  GPC 3 out of 3 samples, GPR in 1 out of 3 samples and Gram stain - CRP 15.7, ESR 15  Addendum: # Anemia post op- hb 6.8, getting PRBCs transfused   Recommendations  - Continue Daptomycin pending sensi's.  - Defer adding Rifampin as unlikely to have more benefit than harm  - Plan for 6 weeks course from 11/10. Will consider PO suppression for approx 3 months after IV course. Plan for SNF noted.  - PICC ordered  - Monitor CBC, CMP and CPK - Postop care per orthopedics - Following or cultures peripherally for OPAT otherwise ID will sign off  Rest of the management as per the primary team. Please call with questions or concerns.  Thank you for the consult  __________________________________________________________________________________________________________ HPI and Hospital Course: 89 year old female with prior history as below who sustained a left intertrochanteric femur fracture fixed in May 2023,  complicated by nonunion and underwent a revision fixation in September 2023 followed by wound healing issues with persistent drainage and underwent debridement in November 1 and  July 28, 2022 (Both OR cx NG) which ultimately healed. 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  She had an inflammatory markers which was elevated per orthopedics.  Aspiration was attempted which yielded a dry tap.  Her incisions and wounds were well-healed at that time but decision was made for proximal femur replacement to remove the nonunion proximal femoral bone and convert it to arthroplasty to allow for immediate weightbearing and hence admitted.   ROS: General- Denies fever, chills, loss of appetite and loss of weight HEENT - Denies headache, blurry vision, neck pain, sinus pain Chest - Denies any chest pain, SOB or cough CVS- Denies any dizziness/lightheadedness, syncopal attacks, palpitations Abdomen- Denies any nausea, vomiting, abdominal pain, hematochezia and diarrhea Neuro - Denies any weakness, numbness, tingling sensation Psych - Denies any changes in mood irritability or depressive symptoms GU- Denies any burning, dysuria, hematuria or increased frequency of urination Skin - denies any rashes/lesions MSK - Left hip pain +  Past Medical History:  Diagnosis Date   Allergy  Anxiety    Arthritis    Calculus of gallbladder and bile duct with obstruction without cholecystitis    Cataract    Choledocholithiasis    Colon cancer (HCC)    Constipation    DDD (degenerative disc disease), lumbar    Depression    Dyspnea    GERD (gastroesophageal reflux disease)    Glaucoma    Headache    Hip fracture (HCC) 12/25/2021   Hip fracture requiring operative repair [facility]) 2023   History of  hiatal hernia    HLD (hyperlipidemia)    Hypertension    Interstitial lung disease (HCC)    Left hip postoperative wound infection    Obstructive sleep apnea 01/29/2014   No Cpap   Osteomyelitis (HCC)    Osteomyelitis (HCC) 06/24/2022   Osteoporosis    Rhabdomyolysis    Severe sepsis (HCC) 05/13/2023   Shingles    Past Surgical History:  Procedure Laterality Date   APPENDECTOMY     CARDIAC CATHETERIZATION     CHOLECYSTECTOMY     COLON SURGERY     COLONOSCOPY     CONVERSION TO TOTAL HIP Left 07/02/2024   Procedure: CONVERSION, PREVIOUS HIP SURGERY, TO TOTAL HIP ARTHROPLASTY;  Surgeon: Edna Toribio LABOR, MD;  Location: [facility];  Service: Orthopedics;  Laterality: Left;   ENDOSCOPIC RETROGRADE CHOLANGIOPANCREATOGRAPHY (ERCP) WITH PROPOFOL  N/A 04/28/2023   Procedure: ENDOSCOPIC RETROGRADE CHOLANGIOPANCREATOGRAPHY (ERCP) WITH PROPOFOL ;  Surgeon: Jinny Carmine, MD;  Location: [facility] END[facility]COPY;  Service: Endoscopy;  Laterality: N/A;   EYE SURGERY Bilateral    cataracts   FEMUR IM NAIL Left 04/28/2022   Procedure: REVISION FIXATION OF LEFT FEMUR FRACTURE;  Surgeon: Kendal Franky SQUIBB, MD;  Location: [facility] OR;  Service: Orthopedics;  Laterality: Left;   FRACTURE SURGERY     HARDWARE REMOVAL Left 04/28/2022   Procedure: HARDWARE REMOVAL;  Surgeon: Kendal Franky SQUIBB, MD;  Location: [facility] OR;  Service: Orthopedics;  Laterality: Left;   HERNIA REPAIR     I & D EXTREMITY Left 06/23/2022   Procedure: IRRIGATION AND DEBRIDEMENT LEFT HIP;  Surgeon: Kendal Franky SQUIBB, MD;  Location: [facility] OR;  Service: Orthopedics;  Laterality: Left;   I & D EXTREMITY Left 07/28/2022   Procedure: IRRIGATION AND DEBRIDEMENT HIP;  Surgeon: Kendal Franky SQUIBB, MD;  Location: [facility] OR;  Service: Orthopedics;  Laterality: Left;   INTRAMEDULLARY (IM) NAIL INTERTROCHANTERIC Left 12/26/2021   Procedure: INTRAMEDULLARY (IM) NAIL INTERTROCHANTRIC;  Surgeon: Rollene Cough, MD;  Location: [facility] OR[facility];  Service: Orthopedics;  Laterality: Left;    MULTIPLE TOOTH EXTRACTIONS     full dentures   REMOVAL OF STONES  04/28/2023   Procedure: REMOVAL OF STONES;  Surgeon: Jinny Carmine, MD;  Location: [facility] END[facility];  Service: Endoscopy;;   shoulder  surgery Left    SPHINCTEROTOMY  04/28/2023   Procedure: SPHINCTEROTOMY;  Surgeon: Jinny Carmine, MD;  Location: [facility] ENDOSCOPY;  Service: Endoscopy;;   TONSILLECTOMY     TUBAL LIGATION     UPPER GI ENDOSCOPY     WRIST SURGERY Right    Scheduled Meds:  aspirin   81 mg Oral BID   brimonidine   1 drop Both Eyes BID   docusate sodium   100 mg Oral BID   dorzolamide-timolol  1 drop Both Eyes BID   feeding supplement  237 mL Oral BID BM   latanoprost   1 drop Both Eyes QHS   losartan   100 mg Oral Daily   metoprolol  tartrate  12.5 mg Oral BID   montelukast   10 mg  Oral QHS   multivitamin with minerals  1 tablet Oral Daily   pantoprazole   40 mg Oral Daily   pravastatin   40 mg Oral q1800   senna  1 tablet Oral BID   sertraline   50 mg Oral Daily   traZODone   50 mg Oral QHS   Continuous Infusions:  DAPTOmycin 500 mg (07/05/24 1151)   lactated ringers  75 mL/hr at 07/04/24 0141   PRN Meds:.acetaminophen , diphenhydrAMINE, HYDROmorphone (DILAUDID) injection, menthol  **OR** phenol, methocarbamol **OR** methocarbamol (ROBAXIN) injection, ondansetron  **OR** ondansetron  (ZOFRAN ) IV, oxyCODONE , polyethylene glycol, tiZANidine   No Known Allergies  Social History   Socioeconomic History   Marital status: Widowed    Spouse name: Not on file   Number of children: Not on file   Years of education: Not on file   Highest education level: 12th grade  Occupational History   Not on file  Tobacco Use   Smoking status: Never    Passive exposure: Never   Smokeless tobacco: Never  Vaping Use   Vaping status: Never Used  Substance and Sexual Activity   Alcohol use: Not Currently   Drug use: Never   Sexual activity: Not Currently    Birth control/protection: Post-menopausal  Other Topics Concern   Not on  file  Social History Narrative   Not on file   Social Drivers of Health   Financial Resource Strain: Low Risk  (03/22/2024)   Overall Financial Resource Strain (CARDIA)    Difficulty of Paying Living Expenses: Not hard at all  Food Insecurity: No Food Insecurity (07/02/2024)   Hunger Vital Sign    Worried About Running Out of Food in the Last Year: Never true    Ran Out of Food in the Last Year: Never true  Transportation Needs: No Transportation Needs (07/02/2024)   PRAPARE - Administrator, Civil Service (Medical): No    Lack of Transportation (Non-Medical): No  Physical Activity: Inactive (03/22/2024)   Exercise Vital Sign    Days of Exercise per Week: 0 days    Minutes of Exercise per Session: Not on file  Stress: No Stress Concern Present (03/22/2024)   [employer] of Occupational Health - Occupational Stress Questionnaire    Feeling of Stress: Not at all  Social Connections: Unknown (07/02/2024)   Social Connection and Isolation Panel    Frequency of Communication with Friends and Family: Not on file    Frequency of Social Gatherings with Friends and Family: Not on file    Attends Religious Services: Never    Active Member of Clubs or Organizations: No    Attends Banker Meetings: Never    Marital Status: Not on file  Intimate Partner Violence: Not At Risk (07/02/2024)   Humiliation, Afraid, Rape, and Kick questionnaire    Fear of Current or Ex-Partner: No    Emotionally Abused: No    Physically Abused: No    Sexually Abused: No   Family History  Problem Relation Age of Onset   Hypertension Mother    Heart disease Mother    Heart disease Father    Vitals BP 110/70 (BP Location: Left Arm)   Pulse 81   Temp (!) 97.4 F (36.3 C) (Oral)   Resp 18   Ht 5' 4 (1.626 m)   Wt 67.1 kg   LMP  (LMP Unknown)   SpO2 92%   BMI 25.40 kg/m   Physical Exam Constitutional: Elderly female lying in the bed, nontoxic-appearing    Comments: HEENT  WNL  Cardiovascular:     Rate and Rhythm: Normal rate     Heart sounds: S1 and S2  Pulmonary:     Effort: Pulmonary effort is normal.     Comments: Normal breath sounds  Abdominal:     Palpations: Abdomen is soft.     Tenderness: Nondistended and nontender  Musculoskeletal:        General: No swelling or tenderness in peripheral joints.  Left hip in a postsurgical dressing C/D/I  Skin:    Comments: No rashes  Neurological:     General: Awake, alert and oriented, hard of hearing  Psychiatric:        Mood and Affect: Mood normal.   Pertinent Microbiology Results for orders placed or performed during the hospital encounter of 07/02/24  Fungus Culture With Stain     Status: None   Collection Time: 07/02/24  3:01 PM   Specimen: Soft Tissue, Other  Result Value Ref Range Status   Fungus Stain Final report  Final    Comment: (NOTE) Performed At: [facility] [address] [place], [place] [id number] Jennette Shorter MD Ey:[id number]    Fungus (Mycology) Culture PENDING  Incomplete   Fungal Source TISSUE  Corrected    Comment: LEFT HIP A1 Performed at [facility], [address] [place], [place] [address] CORRECTED ON 11/10 AT 2233: PREVIOUSLY REPORTED AS [id number]   Acid Fast Smear (AFB)     Status: None   Collection Time: 07/02/24  3:01 PM   Specimen: Soft Tissue, Other  Result Value Ref Range Status   AFB Specimen Processing Comment  Final    Comment: Tissue Grinding and Digestion/Decontamination   Acid Fast Smear Negative  Final    Comment: (NOTE) Performed At: [facility] [address] [place], [place] [id number] Jennette Shorter MD Ey:[id number]    Source (AFB) TISSUE  Corrected    Comment: LEFT HIP A1 Performed at [facility], [address], [place], [place] [address] CORRECTED ON 11/10 AT 2233: PREVIOUSLY REPORTED AS [id number]   Fungus Culture Result     Status: None   Collection Time: 07/02/24  3:01 PM  Result Value Ref  Range Status   Result 1 Comment  Final    Comment: (NOTE) KOH/Calcofluor preparation:  no fungus observed. Performed At: [facility] [address] [place], [place] [id number] Jennette Shorter MD Ey:[id number]   Aerobic/Anaerobic Culture w Gram Stain (surgical/deep wound)     Status: None (Preliminary result)   Collection Time: 07/02/24  4:36 PM   Specimen: Soft Tissue, Other  Result Value Ref Range Status   Specimen Description TISSUE LEFT HIP  Final   Special Requests  A1 HOLD 2WKS  Final   Gram Stain   Final    FEW WBC PRESENT, PREDOMINANTLY MONONUCLEAR RARE GRAM POSITIVE COCCI Performed at [facility], [address] [place], [place] [address]    Culture   Final    RARE STAPHYLOCOCCUS EPIDERMIDIS NO ANAEROBES ISOLATED; CULTURE IN PROGRESS FOR 5 DAYS    Report Status PENDING  Incomplete  Fungus Culture With Stain     Status: None   Collection Time: 07/02/24  4:38 PM   Specimen: Soft Tissue, Other  Result Value Ref Range Status   Fungus Stain Final report  Final    Comment: (NOTE) Performed At: [facility] [address] [place], [place] [id number] Jennette Shorter MD Ey:[id number]    Fungus (Mycology) Culture PENDING  Incomplete   Fungal  Source TISSUE  Corrected    Comment: LEFT HIP B2 Performed at [facility], [address], [place], [place] [address] CORRECTED ON 11/10 AT 2239: PREVIOUSLY REPORTED AS [id number]   Aerobic/Anaerobic Culture w Gram Stain (surgical/deep wound)     Status: None (Preliminary result)   Collection Time: 07/02/24  4:38 PM   Specimen: Soft Tissue, Other  Result Value Ref Range Status   Specimen Description TISSUE LEFT HIP  Final   Special Requests B2 HOLD 2WKS  Final   Gram Stain   Final    FEW WBC PRESENT, PREDOMINANTLY MONONUCLEAR RARE GRAM POSITIVE COCCI Performed at [facility], [address] [place], [place] [address]    Culture   Final    RARE STAPHYLOCOCCUS EPIDERMIDIS SUSCEPTIBILITIES TO  FOLLOW NO ANAEROBES ISOLATED; CULTURE IN PROGRESS FOR 5 DAYS    Report Status PENDING  Incomplete  Acid Fast Smear (AFB)     Status: None   Collection Time: 07/02/24  4:38 PM   Specimen: Soft Tissue, Other  Result Value Ref Range Status   AFB Specimen Processing Comment  Final    Comment: Tissue Grinding and Digestion/Decontamination   Acid Fast Smear Negative  Final    Comment: (NOTE) Performed At: [facility] [address] [place], [place] [id number] Jennette Shorter MD Ey:[id number]    Source (AFB) TISSUE  Corrected    Comment: LEFT HIP B2 Performed at [facility], [address], [place], [place] [address] CORRECTED ON 11/10 AT 2239: PREVIOUSLY REPORTED AS [id number]   Fungus Culture Result     Status: None   Collection Time: 07/02/24  4:38 PM  Result Value Ref Range Status   Result 1 Comment  Final    Comment: (NOTE) KOH/Calcofluor preparation:  no fungus observed. Performed At: [facility] [address] [place], [place] [id number] Jennette Shorter MD Ey:[id number]   Fungus Culture With Stain     Status: None   Collection Time: 07/02/24  4:39 PM   Specimen: Soft Tissue, Other  Result Value Ref Range Status   Fungus Stain Final report  Final    Comment: (NOTE) Performed At: [facility] [address] [place], [place] [id number] Jennette Shorter MD Ey:[id number]    Fungus (Mycology) Culture PENDING  Incomplete   Fungal Source TISSUE  Corrected    Comment: LEFT HIP C3 Performed at [facility], [address], [place], [place] [address] CORRECTED ON 11/10 AT 2240: PREVIOUSLY REPORTED AS [id number]   Aerobic/Anaerobic Culture w Gram Stain (surgical/deep wound)     Status: None (Preliminary result)   Collection Time: 07/02/24  4:39 PM   Specimen: Soft Tissue, Other  Result Value Ref Range Status   Specimen Description TISSUE BONE LEFT HIP  Final   Special Requests C3 HOLD 2WKS  Final   Gram Stain   Final    FEW WBC SEEN RARE GRAM  POSITIVE RODS RARE GRAM POSITIVE COCCI    Culture   Final    NO GROWTH 3 DAYS NO ANAEROBES ISOLATED; CULTURE IN PROGRESS FOR 5 DAYS Performed at [facility], [address], [place], [place] [address]    Report Status PENDING  Incomplete  Acid Fast Smear (AFB)     Status: None   Collection Time: 07/02/24  4:39 PM   Specimen: Soft Tissue, Other  Result Value Ref Range Status   AFB Specimen Processing Comment  Final    Comment: Tissue Grinding and Digestion/Decontamination  Acid Fast Smear Negative  Final    Comment: (NOTE) Performed At: [facility] [address] [place], [place] [id number] Jennette Shorter MD Ey:[id number]    Source (AFB) TISSUE  Corrected    Comment: LEFT HIP C3 Performed at [facility], [address], [place], [place] [address] CORRECTED ON 11/10 AT 2240: PREVIOUSLY REPORTED AS [id number]   Fungus Culture Result     Status: None   Collection Time: 07/02/24  4:39 PM  Result Value Ref Range Status   Result 1 Comment  Final    Comment: (NOTE) KOH/Calcofluor preparation:  no fungus observed. Performed At: [facility] [address] [place], [place] [id number] Jennette Shorter MD Ey:[id number]    Pertinent Lab seen by me:    Latest Ref Rng & Units 07/06/2024    9:11 AM 07/04/2024    5:28 AM 07/03/2024    5:39 AM  CBC  WBC 4.0 - 10.5 K/uL 8.6  11.3  13.8   Hemoglobin 12.0 - 15.0 g/dL 6.8  8.0  7.5   Hematocrit 36.0 - 46.0 % 20.6  24.4  23.3   Platelets 150 - 400 K/uL 248  207  193       Latest Ref Rng & Units 07/06/2024   10:15 AM 07/03/2024    5:39 AM 06/22/2024   11:43 AM  CMP  Glucose 70 - 99 mg/dL 894  855  92   BUN 8 - 23 mg/dL 8  8  17    Creatinine 0.44 - 1.00 mg/dL 9.56  9.38  9.30   Sodium 135 - 145 mmol/L 129  131  135   Potassium 3.5 - 5.1 mmol/L 4.2  4.4  4.9   Chloride 98 - 111 mmol/L 96  103  103   CO2 22 - 32 mmol/L 25  23  23    Calcium 8.9 - 10.3 mg/dL 9.3  8.7  89.5   Total Protein 6.5 - 8.1 g/dL    7.8   Total Bilirubin 0.0 - 1.2 mg/dL   0.4   Alkaline Phos 38 - 126 U/L   67   AST 15 - 41 U/L   24   ALT 0 - 44 U/L   8      Pertinent Imagings/Other Imagings Plain films and CT images have been personally visualized and interpreted; radiology reports have been reviewed. Decision making incorporated into the Impression / Recommendations.  DG HIP UNILAT W OR W/O PELVIS 2-3 VIEWS LEFT Result Date: 07/02/2024 EXAM: 2 OR MORE VIEW(S) XRAY OF THE LEFT HIP 07/02/2024 07:35:00 PM COMPARISON: None available. CLINICAL HISTORY: Post-operative state FINDINGS: BONES AND JOINTS: Revision left total hip arthroplasty with long femoral stem in place. Residual screw fragment is noted in the distal femur. SOFT TISSUES: Soft tissue gas consistent with recent surgery. IMPRESSION: 1. Revision left total hip arthroplasty with long femoral stem in place. 2. Residual screw fragment in the distal femur. Electronically signed by: Oneil Devonshire MD 07/02/2024 08:11 PM EST RP Workstation: [id number]   DG HIP UNILAT WITH PELVIS 1V LEFT Result Date: 07/02/2024 EXAM: XR Intraoperative Imaging C arm, Left Femur, 1 View. TECHNIQUE: Fluoroscopy was provided by the radiology department for procedure. Radiologist was not present during examination. FLUOROSCOPY DOSE AND TYPE: Radiation Dose Index: Reference Air Kerma (in mGy) = 2.5 Fluoroscopy Time: 31 seconds Number of Images: 5 COMPARISON: 04/13/2024 CLINICAL HISTORY: 886218 Surgery, elective 886218. Intraprocedural imaging. FINDINGS: Intraoperative fluoroscopic imaging was performed. Initial images demonstrate removal of a medullary rod in  the Left Femur. A screw fragment is noted in the distal Left Femur. A reaming device is noted in the mid Femur with removal of the proximal Femur identified. An acetabular component is noted in satisfactory position. Completion image was not performed. IMPRESSION: 1. Intraoperative removal of left femoral medullary rod with residual distal screw  fragment and mid-femoral reaming device noted on limited images. 2. Acetabular component in satisfactory position. NOTE: Intraoperative fluoroscopic spot images as above. Please refer to the intraoperative report for full details. Electronically signed by: Oneil Devonshire MD 07/02/2024 08:02 PM EST RP Workstation: [id number] BARE C-Arm 1-60 Min-No Report Result Date: 07/02/2024 Fluoroscopy was utilized by the requesting physician.  No radiographic interpretation.   DG C-Arm 1-60 Min-No Report Result Date: 07/02/2024 Fluoroscopy was utilized by the requesting physician.  No radiographic interpretation.   DG C-Arm 1-60 Min-No Report Result Date: 07/02/2024 Fluoroscopy was utilized by the requesting physician.  No radiographic interpretation.   I spent 82 minutes involved in face-to-face and non-face-to-face activities for this patient on the day of the visit. Professional time spent includes the following activities: Preparing to see the patient (review of tests), Obtaining and reviewing separately obtained history (H&P, or note), Performing a medically appropriate examination and evaluation , Ordering medications/labs, referring and communicating with other health care professionals, Documenting clinical information in the EMR, Independently interpreting results (not separately reported), Communicating results to the patient, Counseling and educating the patient and Care coordination (not separately reported).  Electronically signed by:   Plan d/w requesting provider as well as ID pharm D  Of note, portions of this note may have been created with voice recognition software. While this note has been edited for accuracy, occasional wrong-word or 'sound-a-like' substitutions may have occurred due to the inherent limitations of voice recognition software.   Annalee Orem, MD Infectious Disease Physician [facility] Pager: [phone]

## 2024-07-06 NOTE — Progress Notes (Addendum)
 Lab called with a critical HGB of 6.8. I left a voicemail with Augustin Mclean and sent a secure chat with her and Dr. Edna. Waiting for response.   Spoke with Megan Gawne who will put orders in for Ms. Charrette.

## 2024-07-07 NOTE — Progress Notes (Addendum)
     Subjective: Pain controlled this morning. Unable to do afternoon session with PT yesterday due to blood transfusion. No complaints this morning, no lightheadedness or dizziness. ID consulted yesterday with plan for 6 weeks of IV antibiotics.    Yesterday's total administered Morphine  Milligram Equivalents: 30   Objective:   VITALS:   Vitals:   07/06/24 1408 07/06/24 1633 07/06/24 2107 07/07/24 0533  BP: (!) 151/56 (!) 154/61 100/69 131/79  Pulse: 84 81 83 78  Resp: 18 19 15 15   Temp: 98 F (36.7 C) 98.3 F (36.8 C) 97.9 F (36.6 C) 98 F (36.7 C)  TempSrc: Oral Oral Oral Oral  SpO2: 94% 93% 94% 95%  Weight:      Height:        Laying in bed in NAD Sensation intact distally Intact pulses distally Dorsiflexion/Plantar flexion intact Incision: dressing C/D/I Compartment soft Wound vac in place maintaining good seal, no fluid collected in cannister Wiggles toes appropriately    Lab Results  Component Value Date   WBC 8.6 07/06/2024   HGB 7.6 (L) 07/06/2024   HCT 22.8 (L) 07/06/2024   MCV 108.4 (H) 07/06/2024   PLT 248 07/06/2024   BMET    Component Value Date/Time   NA 129 (L) 07/06/2024 1015   NA 136 03/26/2024 1614   K 4.2 07/06/2024 1015   CL 96 (L) 07/06/2024 1015   CO2 25 07/06/2024 1015   GLUCOSE 105 (H) 07/06/2024 1015   BUN 8 07/06/2024 1015   BUN 12 03/26/2024 1614   CREATININE 0.43 (L) 07/06/2024 1015   CALCIUM 9.3 07/06/2024 1015   EGFR 78 03/26/2024 1614   GFRNONAA >60 07/06/2024 1015      Xray: Total hip arthroplasty components with proximal femoral replacement in good alignment no adverse features  Assessment/Plan: 5 Days Post-Op   Principal Problem:   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 NGTD.  IntraOp cultures positive for Staph epidermidis. Sensitivities don't look to be back yet. Please see ID plan for antibiotics at discharge.  Imaging: PACU pelvis Xray Dressing: prevena wound vac, keep intact until follow up DVT prophylaxis: Aspirin  81BID starting POD1 Follow up: 1 weeks after surgery for a wound check with Dr. [place] at [facility].  Address: [address], [place], [place] [address]  Office Phone: [phone]  Dispo: 1 unit PRBC order yesterday, Hbg 7.6 this morning. Plan to keep overnight tonight to watch and make sure Hbg doesn't drop again. Likely will need to wait until Monday to discharge to rehab to have all antibiotics set up at SNF.     Arm[employer] MARLA Daring 07/07/2024, 8:55 AM    Contact information:   Tzzxijbd 7am-5pm epic message Dr. [place], or call office for patient follow up: [phone] After hours and holidays please check [email or web address] for group call information for [facility]

## 2024-07-07 NOTE — TOC Progression Note (Signed)
 Transition of Care [facility]) - Progression Note    Patient Details  Name: Melanie Carrillo MRN: [id number] Date of Birth: 1934/11/29  Transition of Care [facility]) CM/SW Contact  Sonda Manuella Quill, RN Phone Number: 07/07/2024, 5:45 PM  Clinical Narrative:    Notified by Army Daring, PA pt not ready for d/c today; also plan for 6 weeks IV antibiotics from 07/02/24; PICC placed 07/06/24; IP CM is following.   Expected Discharge Plan: Skilled Nursing Facility Barriers to Discharge: SNF Pending bed offer, Insurance Authorization               Expected Discharge Plan and Services     Post Acute Care Choice: Skilled Nursing Facility                                         Social Drivers of Health (SDOH) Interventions SDOH Screenings   Food Insecurity: No Food Insecurity (07/02/2024)  Housing: Low Risk  (07/02/2024)  Transportation Needs: No Transportation Needs (07/02/2024)  Utilities: Not At Risk (07/02/2024)  Alcohol Screen: Low Risk  (11/26/2022)  Depression (PHQ2-9): Low Risk  (04/12/2024)  Financial Resource Strain: Low Risk  (03/22/2024)  Physical Activity: Inactive (03/22/2024)  Social Connections: Unknown (07/02/2024)  Stress: No Stress Concern Present (03/22/2024)  Tobacco Use: Low Risk  (07/02/2024)  Health Literacy: Inadequate Health Literacy (02/20/2024)    Readmission Risk Interventions    07/03/2024   12:01 PM  Readmission Risk Prevention Plan  Post Dischage Appt Complete  Medication Screening Complete  Transportation Screening Complete

## 2024-07-07 NOTE — Progress Notes (Signed)
 ID brief note  Results for orders placed or performed during the hospital encounter of 07/02/24  Fungus Culture With Stain     Status: None   Collection Time: 07/02/24  3:01 PM   Specimen: Soft Tissue, Other  Result Value Ref Range Status   Fungus Stain Final report  Final    Comment: (NOTE) Performed At: [facility] [address] [place], [place] [id number] Jennette Shorter MD Ey:[id number]    Fungus (Mycology) Culture PENDING  Incomplete   Fungal Source TISSUE  Corrected    Comment: LEFT HIP A1 Performed at [facility], [address], [place], [place] [address] CORRECTED ON 11/10 AT 2233: PREVIOUSLY REPORTED AS [id number]   Acid Fast Smear (AFB)     Status: None   Collection Time: 07/02/24  3:01 PM   Specimen: Soft Tissue, Other  Result Value Ref Range Status   AFB Specimen Processing Comment  Final    Comment: Tissue Grinding and Digestion/Decontamination   Acid Fast Smear Negative  Final    Comment: (NOTE) Performed At: [facility] [address] [place], [place] [id number] Jennette Shorter MD Ey:[id number]    Source (AFB) TISSUE  Corrected    Comment: LEFT HIP A1 Performed at [facility], [address], [place], [place] [address] CORRECTED ON 11/10 AT 2233: PREVIOUSLY REPORTED AS [id number]   Fungus Culture Result     Status: None   Collection Time: 07/02/24  3:01 PM  Result Value Ref Range Status   Result 1 Comment  Final    Comment: (NOTE) KOH/Calcofluor preparation:  no fungus observed. Performed At: [facility] [address] [place], [place] [id number] Jennette Shorter MD Ey:[id number]   Aerobic/Anaerobic Culture w Gram Stain (surgical/deep wound)     Status: None (Preliminary result)   Collection Time: 07/02/24  4:36 PM   Specimen: Soft Tissue, Other  Result Value Ref Range Status   Specimen Description TISSUE LEFT HIP  Final   Special Requests  A1 HOLD 2WKS  Final   Gram Stain   Final    FEW WBC PRESENT, PREDOMINANTLY  MONONUCLEAR RARE GRAM POSITIVE COCCI Performed at [facility], [address], [place], [place] [address]    Culture   Final    RARE STAPHYLOCOCCUS EPIDERMIDIS NO ANAEROBES ISOLATED; CULTURE IN PROGRESS FOR 5 DAYS    Report Status PENDING  Incomplete  Fungus Culture With Stain     Status: None   Collection Time: 07/02/24  4:38 PM   Specimen: Soft Tissue, Other  Result Value Ref Range Status   Fungus Stain Final report  Final    Comment: (NOTE) Performed At: [facility] [address] [place], [place] [id number] Jennette Shorter MD Ey:[id number]    Fungus (Mycology) Culture PENDING  Incomplete   Fungal Source TISSUE  Corrected    Comment: LEFT HIP B2 Performed at [facility], [address] [place], [place] [address] CORRECTED ON 11/10 AT 2239: PREVIOUSLY REPORTED AS [id number]   Aerobic/Anaerobic Culture w Gram Stain (surgical/deep wound)     Status: None (Preliminary result)   Collection Time: 07/02/24  4:38 PM   Specimen: Soft Tissue, Other  Result Value Ref Range Status   Specimen Description TISSUE LEFT HIP  Final   Special Requests B2 HOLD 2WKS  Final   Gram Stain   Final    FEW WBC PRESENT, PREDOMINANTLY MONONUCLEAR RARE GRAM POSITIVE COCCI Performed at [facility], [address] [place], [place]  [address]    Culture   Final    RARE STAPHYLOCOCCUS EPIDERMIDIS AMONG MIXED SKIN FLORA FAILED TO GROW FOR SUSCEPTIBILITY TESTING NO ANAEROBES ISOLATED; CULTURE IN PROGRESS FOR 5 DAYS    Report Status PENDING  Incomplete  Acid Fast Smear (AFB)     Status: None   Collection Time: 07/02/24  4:38 PM   Specimen: Soft Tissue, Other  Result Value Ref Range Status   AFB Specimen Processing Comment  Final    Comment: Tissue Grinding and Digestion/Decontamination   Acid Fast Smear Negative  Final    Comment: (NOTE) Performed At: [facility] [address] [place], [place] [id number] Jennette Shorter MD Ey:[id number]    Source (AFB) TISSUE   Corrected    Comment: LEFT HIP B2 Performed at [facility], [address] [place], [place] [address] CORRECTED ON 11/10 AT 2239: PREVIOUSLY REPORTED AS [id number]   Fungus Culture Result     Status: None   Collection Time: 07/02/24  4:38 PM  Result Value Ref Range Status   Result 1 Comment  Final    Comment: (NOTE) KOH/Calcofluor preparation:  no fungus observed. Performed At: [facility] [address] [place], [place] [id number] Jennette Shorter MD Ey:[id number]   Fungus Culture With Stain     Status: None   Collection Time: 07/02/24  4:39 PM   Specimen: Soft Tissue, Other  Result Value Ref Range Status   Fungus Stain Final report  Final    Comment: (NOTE) Performed At: [facility] [address] [place], [place] [id number] Jennette Shorter MD Ey:[id number]    Fungus (Mycology) Culture PENDING  Incomplete   Fungal Source TISSUE  Corrected    Comment: LEFT HIP C3 Performed at [facility], [address], [place], [place] [address] CORRECTED ON 11/10 AT 2240: PREVIOUSLY REPORTED AS [id number]   Aerobic/Anaerobic Culture w Gram Stain (surgical/deep wound)     Status: None (Preliminary result)   Collection Time: 07/02/24  4:39 PM   Specimen: Soft Tissue, Other  Result Value Ref Range Status   Specimen Description TISSUE BONE LEFT HIP  Final   Special Requests C3 HOLD 2WKS  Final   Gram Stain   Final    FEW WBC SEEN RARE GRAM POSITIVE RODS RARE GRAM POSITIVE COCCI    Culture   Final    NO GROWTH 4 DAYS NO ANAEROBES ISOLATED; CULTURE IN PROGRESS FOR 5 DAYS Performed at [facility], [address] [place], [place] [address]    Report Status PENDING  Incomplete  Acid Fast Smear (AFB)     Status: None   Collection Time: 07/02/24  4:39 PM   Specimen: Soft Tissue, Other  Result Value Ref Range Status   AFB Specimen Processing Comment  Final    Comment: Tissue Grinding and Digestion/Decontamination   Acid Fast Smear Negative  Final     Comment: (NOTE) Performed At: [facility] [address] [place], [place] [id number] Jennette Shorter MD Ey:[id number]    Source (AFB) TISSUE  Corrected    Comment: LEFT HIP C3 Performed at [facility], [address], [place], [place] [address] CORRECTED ON 11/10 AT 2240: PREVIOUSLY REPORTED AS [id number]   Fungus Culture Result     Status: None   Collection Time: 07/02/24  4:39 PM  Result Value Ref Range Status   Result 1 Comment  Final    Comment: (NOTE) KOH/Calcofluor preparation:  no fungus observed. Performed At: [facility] [address] [place], [place] [id number] Jennette Shorter MD Ey:[id number]    I have asked microbiology to run sensi's of staph epidermidis from both OR samples  Annalee Orem, MD Infectious Disease Physician [facility] [address] [place], [place] [address] Phone: [phone]  Fax: [phone]

## 2024-07-07 NOTE — Progress Notes (Signed)
 Physical Therapy Treatment Patient Details Name: Melanie Carrillo MRN: [id number] DOB: 1934-10-20 Today's Date: 07/07/2024   History of Present Illness Pt is an 89y.o. 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 PMH significant for, but not limited to: anxiety, depression, colon cancer, DDD, and GERD.    PT Comments  Pt agreeable to therapy session. Tolerated activity fairly well. Audible wheezing + dyspnea noted with ambulation--O2 83% on RA--with time and cueing for breathing technique, sats recovered to 90% on RA once seated and at rest. Assisted pt back to bed at her request. Will continue to follow and progress activity as tolerated. Plan is for ST SNF rehab before returning home.    If plan is discharge home, recommend the following: A little help with walking and/or transfers;A lot of help with bathing/dressing/bathroom;Assistance with cooking/housework;Help with stairs or ramp for entrance;Assist for transportation   Can travel by private vehicle        Equipment Recommendations  None recommended by PT    Recommendations for Other Services       Precautions / Restrictions Precautions Precautions: Fall;Posterior Hip Precaution Booklet Issued: Yes (comment) Recall of Precautions/Restrictions: Impaired Precaution/Restrictions Comments: LLE wound vac Restrictions Weight Bearing Restrictions Per Provider Order: No LLE Weight Bearing Per Provider Order: Weight bearing as tolerated     Mobility  Bed Mobility Overal bed mobility: Needs Assistance Bed Mobility: Supine to Sit, Sit to Supine     Supine to sit: Min assist, HOB elevated, Used rails Sit to supine: Min assist, HOB elevated, Used rails   General bed mobility comments: Cues for safety, adherence to precautions. Assist for L LE on/off bed. Increased time and effort for pt.    Transfers Overall transfer level: Needs  assistance Equipment used: Rolling walker (2 wheels) Transfers: Sit to/from Stand Sit to Stand: Contact guard assist           General transfer comment: cues for safety, technique, hand/LE placement.    Ambulation/Gait Ambulation/Gait assistance: Contact guard assist Gait Distance (Feet): 85 Feet Assistive device: Rolling walker (2 wheels) Gait Pattern/deviations: Decreased dorsiflexion - right, Decreased dorsiflexion - left, Trunk flexed, Step-to pattern, Decreased stance time - left       General Gait Details: Cues for safety, RW proximity, posture. Audible wheezing + dyspnea with ambulation. O2 83% on RA. Cues for pursed lip breathing once seated at rest. With time, sats recovered to 90% on RA and cues.   Stairs             Wheelchair Mobility     Tilt Bed    Modified Rankin (Stroke Patients Only)       Balance Overall balance assessment: Needs assistance         Standing balance support: Bilateral upper extremity supported, Reliant on assistive device for balance, During functional activity Standing balance-Leahy Scale: Fair                              Hotel Manager: Impaired Factors Affecting Communication: Hearing impaired  Cognition Arousal: Alert Behavior During Therapy: WFL for tasks assessed/performed   PT - Cognitive impairments: No apparent impairments                         Following commands: Intact      Cueing Cueing Techniques: Verbal cues  Exercises Total Joint Exercises Ankle  Circles/Pumps: AROM, Both, 10 reps Quad Sets: AROM, Both, 10 reps Heel Slides: AAROM, Left, 10 reps Hip ABduction/ADduction: AAROM, Left, 10 reps    General Comments        Pertinent Vitals/Pain Pain Assessment Pain Assessment: 0-10 Pain Score: 5  Pain Location: L hip Pain Descriptors / Indicators: Discomfort, Guarding Pain Intervention(s): Limited activity within patient's tolerance, Monitored  during session, Ice applied, Repositioned    Home Living                          Prior Function            PT Goals (current goals can now be found in the care plan section) Progress towards PT goals: Progressing toward goals    Frequency    7X/week      PT Plan      Co-evaluation              AM-PAC PT 6 Clicks Mobility   Outcome Measure  Help needed turning from your back to your side while in a flat bed without using bedrails?: A Little Help needed moving from lying on your back to sitting on the side of a flat bed without using bedrails?: A Little Help needed moving to and from a bed to a chair (including a wheelchair)?: A Little Help needed standing up from a chair using your arms (e.g., wheelchair or bedside chair)?: A Little Help needed to walk in hospital room?: A Little Help needed climbing 3-5 steps with a railing? : A Lot 6 Click Score: 17    End of Session Equipment Utilized During Treatment: Gait belt Activity Tolerance: Patient limited by fatigue Patient left: in bed;with call bell/phone within reach;with bed alarm set   PT Visit Diagnosis: Unsteadiness on feet (R26.81);Pain;Muscle weakness (generalized) (M62.81);Difficulty in walking, not elsewhere classified (R26.2) Pain - Right/Left: Left Pain - part of body: Hip     Time: 8877-8857 PT Time Calculation (min) (ACUTE ONLY): 20 min  Charges:    $Gait Training: 8-22 mins PT General Charges $$ ACUTE PT VISIT: 1 Visit                        Dannial SQUIBB, PT Acute Rehabilitation  Office: [phone]

## 2024-07-07 NOTE — Plan of Care (Signed)

## 2024-07-08 LAB — CBC
HCT: 25 % — ABNORMAL LOW (ref 36.0–46.0)
Hemoglobin: 8.2 g/dL — ABNORMAL LOW (ref 12.0–15.0)
MCH: 34.5 pg — ABNORMAL HIGH (ref 26.0–34.0)
MCHC: 32.8 g/dL (ref 30.0–36.0)
MCV: 105 fL — ABNORMAL HIGH (ref 80.0–100.0)
Platelets: 313 K/uL (ref 150–400)
RBC: 2.38 MIL/uL — ABNORMAL LOW (ref 3.87–5.11)
RDW: 15.7 % — ABNORMAL HIGH (ref 11.5–15.5)
WBC: 9 K/uL (ref 4.0–10.5)
nRBC: 0.6 % — ABNORMAL HIGH (ref 0.0–0.2)

## 2024-07-08 NOTE — Progress Notes (Signed)
 Physical Therapy Treatment Patient Details Name: Melanie Carrillo MRN: [id number] DOB: 1935/02/04 Today's Date: 07/08/2024   History of Present Illness Pt is an 89y.o. 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 PMH significant for, but not limited to: anxiety, depression, colon cancer, DDD, and GERD.    PT Comments  Pt tolerated session well. Pt did walk in room around bed and to from bathroom, and at that time choose to not walk into hallway at this time. Pt tolerated this mobility fairly well with contact guard to MinA , and mostly managing lines ( PICC lineIV and wound vac) . Pt continues to improve with less pain and increased mobility.    If plan is discharge home, recommend the following: A little help with walking and/or transfers;A lot of help with bathing/dressing/bathroom;Assistance with cooking/housework;Help with stairs or ramp for entrance;Assist for transportation   Can travel by private vehicle        Equipment Recommendations  None recommended by PT    Recommendations for Other Services       Precautions / Restrictions Precautions Precautions: Fall;Posterior Hip Precaution Booklet Issued: Yes (comment) Recall of Precautions/Restrictions: Impaired Precaution/Restrictions Comments: LLE wound vac and PICC pline R inner arm Restrictions Weight Bearing Restrictions Per Provider Order: Yes LLE Weight Bearing Per Provider Order: Weight bearing as tolerated Other Position/Activity Restrictions: WBAT     Mobility  Bed Mobility Overal bed mobility: Needs Assistance Bed Mobility: Supine to Sit, Sit to Supine     Supine to sit: HOB elevated, Used rails, Contact guard     General bed mobility comments: Cues for safety, pt did adher to precautions. Increased time and effort for pt.    Transfers Overall transfer level: Needs  assistance Equipment used: Rolling walker (2 wheels) Transfers: Sit to/from Stand Sit to Stand: Contact guard assist           General transfer comment: cues for safety, technique, hand/LE placement.    Ambulation/Gait Ambulation/Gait assistance: Contact guard assist Gait Distance (Feet): 20 Feet Assistive device: Rolling walker (2 wheels) Gait Pattern/deviations: Trunk flexed, Step-through pattern       General Gait Details: was not able to ambulate into hallway today. Pt ambulated to bathroom, around bed nad back with no diffulties. Tolerated hygiene after tolieting and washed hands . Pt did not want to walk further at this time, however was doinf fairly well for mobility at this time. Step through pattern , not c/o pain.   Stairs             Wheelchair Mobility     Tilt Bed    Modified Rankin (Stroke Patients Only)       Balance Overall balance assessment: Needs assistance Sitting-balance support: No upper extremity supported, Feet supported Sitting balance-Leahy Scale: Good     Standing balance support: Bilateral upper extremity supported, Reliant on assistive device for balance, During functional activity Standing balance-Leahy Scale: Good                              Communication Communication Communication: Impaired Factors Affecting Communication: Hearing impaired  Cognition Arousal: Alert Behavior During Therapy: WFL for tasks assessed/performed   PT - Cognitive impairments: No apparent impairments                         Following commands:  Intact      Cueing Cueing Techniques: Verbal cues  Exercises      General Comments        Pertinent Vitals/Pain Pain Assessment Pain Assessment: Faces Faces Pain Scale: No hurt    Home Living                          Prior Function            PT Goals (current goals can now be found in the care plan section) Acute Rehab PT Goals Patient Stated Goal:  want to have less pain and move better PT Goal Formulation: With patient Time For Goal Achievement: 07/17/24 Potential to Achieve Goals: Good Progress towards PT goals: Progressing toward goals    Frequency    7X/week      PT Plan      Co-evaluation              AM-PAC PT 6 Clicks Mobility   Outcome Measure  Help needed turning from your back to your side while in a flat bed without using bedrails?: None Help needed moving from lying on your back to sitting on the side of a flat bed without using bedrails?: A Little Help needed moving to and from a bed to a chair (including a wheelchair)?: A Little Help needed standing up from a chair using your arms (e.g., wheelchair or bedside chair)?: A Little Help needed to walk in hospital room?: A Little Help needed climbing 3-5 steps with a railing? : A Little 6 Click Score: 19    End of Session Equipment Utilized During Treatment: Gait belt Activity Tolerance: Patient tolerated treatment well (just did not want to walk into hallway this afternoon. Had been back and forth to bathroom earlier.) Patient left: in chair;with call bell/phone within reach (pt understood not to get up on her own, able to recall how to use the callbell for help when wanted to get up.  I dont' take chances) Nurse Communication: Mobility status PT Visit Diagnosis: Unsteadiness on feet (R26.81);Pain;Muscle weakness (generalized) (M62.81);Difficulty in walking, not elsewhere classified (R26.2) Pain - Right/Left: Left Pain - part of body: Hip     Time: 8389-8360 PT Time Calculation (min) (ACUTE ONLY): 29 min  Charges:    $Gait Training: 8-22 mins PT General Charges $$ ACUTE PT VISIT: 1 Visit                     Jalil Lorusso, PT, MPT Acute Rehabilitation Services Office: [phone] If a weekend: secure chat groups: [facility] PT, [facility] OT, [facility] SLP 07/08/2024    Jadah Bobak 07/08/2024, 7:26 PM

## 2024-07-08 NOTE — Plan of Care (Signed)

## 2024-07-08 NOTE — Progress Notes (Signed)
     Subjective: Pain mild-moderate this morning. No complaints this morning, eager to discharge to rehab. ID consulted plan for 6 weeks of IV antibiotics.    Yesterday's total administered Morphine  Milligram Equivalents: 30   Objective:   VITALS:   Vitals:   07/06/24 1408 07/06/24 1633 07/06/24 2107 07/07/24 0533  BP: (!) 151/56 (!) 154/61 100/69 131/79  Pulse: 84 81 83 78  Resp: 18 19 15 15   Temp: 98 F (36.7 C) 98.3 F (36.8 C) 97.9 F (36.6 C) 98 F (36.7 C)  TempSrc: Oral Oral Oral Oral  SpO2: 94% 93% 94% 95%  Weight:      Height:        Laying in bed in NAD Sensation intact distally Intact pulses distally Dorsiflexion/Plantar flexion intact Incision: dressing C/D/I Compartment soft Wound vac in place maintaining good seal, no fluid collected in cannister Wiggles toes appropriately    Lab Results  Component Value Date   WBC 8.6 07/06/2024   HGB 7.6 (L) 07/06/2024   HCT 22.8 (L) 07/06/2024   MCV 108.4 (H) 07/06/2024   PLT 248 07/06/2024   BMET    Component Value Date/Time   NA 129 (L) 07/06/2024 1015   NA 136 03/26/2024 1614   K 4.2 07/06/2024 1015   CL 96 (L) 07/06/2024 1015   CO2 25 07/06/2024 1015   GLUCOSE 105 (H) 07/06/2024 1015   BUN 8 07/06/2024 1015   BUN 12 03/26/2024 1614   CREATININE 0.43 (L) 07/06/2024 1015   CALCIUM 9.3 07/06/2024 1015   EGFR 78 03/26/2024 1614   GFRNONAA >60 07/06/2024 1015      Xray: Total hip arthroplasty components with proximal femoral replacement in good alignment no adverse features  Assessment/Plan: 6 Days Post-Op   Principal Problem:   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 NGTD.  IntraOp cultures positive for Staph epidermidis. Sample failed to grow for susceptibility testing. Please see ID plan for antibiotics at discharge.  Imaging: PACU pelvis Xray Dressing: prevena wound vac, keep intact until follow up DVT prophylaxis: Aspirin  81BID starting POD1 Follow up: 1 weeks after surgery for a wound check with Dr. Edna at [facility].  Address: [address], [place], [place] [address]  Office Phone: [phone]  Dispo: Hbg stabilized this morning. She's ready to discharge to SNF if bed available and plan in place for IV antibiotics. I have reached out to [facility] about bed placement.     Army MARLA Daring 07/07/2024, 8:55 AM    Contact information:   Tzzxijbd 7am-5pm epic message Dr. Edna, or call office for patient follow up: [phone] After hours and holidays please check [email or web address] for group call information for [facility]

## 2024-07-08 NOTE — Progress Notes (Signed)
 ID brief note   Afebrile     Latest Ref Rng & Units 07/08/2024    4:33 AM 07/06/2024    9:16 PM 07/06/2024    9:11 AM  CBC  WBC 4.0 - 10.5 K/uL 9.0   8.6   Hemoglobin 12.0 - 15.0 g/dL 8.2  7.6  6.8   Hematocrit 36.0 - 46.0 % 25.0  22.8  20.6   Platelets 150 - 400 K/uL 313   248       Latest Ref Rng & Units 07/06/2024   10:15 AM 07/03/2024    5:39 AM 06/22/2024   11:43 AM  CMP  Glucose 70 - 99 mg/dL 894  855  92   BUN 8 - 23 mg/dL 8  8  17    Creatinine 0.44 - 1.00 mg/dL 9.56  9.38  9.30   Sodium 135 - 145 mmol/L 129  131  135   Potassium 3.5 - 5.1 mmol/L 4.2  4.4  4.9   Chloride 98 - 111 mmol/L 96  103  103   CO2 22 - 32 mmol/L 25  23  23    Calcium 8.9 - 10.3 mg/dL 9.3  8.7  89.5   Total Protein 6.5 - 8.1 g/dL   7.8   Total Bilirubin 0.0 - 1.2 mg/dL   0.4   Alkaline Phos 38 - 126 U/L   67   AST 15 - 41 U/L   24   ALT 0 - 44 U/L   8    Results for orders placed or performed during the hospital encounter of 07/02/24  Fungus Culture With Stain     Status: None   Collection Time: 07/02/24  3:01 PM   Specimen: Soft Tissue, Other  Result Value Ref Range Status   Fungus Stain Final report  Final    Comment: (NOTE) Performed At: [facility] [address] [place], [place] [id number] Jennette Shorter MD Ey:[id number]    Fungus (Mycology) Culture PENDING  Incomplete   Fungal Source TISSUE  Corrected    Comment: LEFT HIP A1 Performed at [facility], [address] [place], [place] [address] CORRECTED ON 11/10 AT 2233: PREVIOUSLY REPORTED AS [id number]   Acid Fast Smear (AFB)     Status: None   Collection Time: 07/02/24  3:01 PM   Specimen: Soft Tissue, Other  Result Value Ref Range Status   AFB Specimen Processing Comment  Final    Comment: Tissue Grinding and Digestion/Decontamination   Acid Fast Smear Negative  Final    Comment: (NOTE) Performed At: [facility] [address] [place], [place] [id number] Jennette Shorter MD Ey:[id number]     Source (AFB) TISSUE  Corrected    Comment: LEFT HIP A1 Performed at [facility], [address] [place], [place] [address] CORRECTED ON 11/10 AT 2233: PREVIOUSLY REPORTED AS [id number]   Fungus Culture Result     Status: None   Collection Time: 07/02/24  3:01 PM  Result Value Ref Range Status   Result 1 Comment  Final    Comment: (NOTE) KOH/Calcofluor preparation:  no fungus observed. Performed At: [facility] [address] [place], [place] [id number] Jennette Shorter MD Ey:[id number]   Aerobic/Anaerobic Culture w Gram Stain (surgical/deep wound)     Status: None (Preliminary result)   Collection Time: 07/02/24  4:36 PM   Specimen: Soft Tissue, Other  Result Value Ref Range Status   Specimen Description TISSUE LEFT HIP  Final   Special Requests  A1  HOLD 2WKS  Final   Gram Stain   Final    FEW WBC PRESENT, PREDOMINANTLY MONONUCLEAR RARE GRAM POSITIVE COCCI    Culture   Final    RARE STAPHYLOCOCCUS EPIDERMIDIS NO ANAEROBES ISOLATED; CULTURE IN PROGRESS FOR 5 DAYS CONTINUING TO HOLD Sent to [facility] for further susceptibility testing.    Report Status PENDING  Incomplete   Organism ID, Bacteria STAPHYLOCOCCUS EPIDERMIDIS  Final      Susceptibility   Staphylococcus epidermidis - MIC*    Inducible Clindamycin NEGATIVE Sensitive     TRIMETH/SULFA Value in next row Sensitive      SENSITIVEMIC <=10    ERYTHROMYCIN Value in next row Resistant      RESISTANTMIC >=8    CLINDAMYCIN Value in next row Sensitive      SENSITIVEMIC <=0.25    VANCOMYCIN  Value in next row Sensitive      SENSITIVEMIC 1    TETRACYCLINE Value in next row Sensitive      SENSITIVEMIC 4    GENTAMICIN  Value in next row Sensitive      SENSITIVEMIC <=0.5    RIFAMPIN Value in next row Sensitive      SENSITIVEMIC <=0.5    OXACILLIN Value in next row Resistant      RESISTANTMIC 1Performed at [facility], [address] [place], [place] [address]    * RARE STAPHYLOCOCCUS EPIDERMIDIS  Fungus  Culture With Stain     Status: None   Collection Time: 07/02/24  4:38 PM   Specimen: Soft Tissue, Other  Result Value Ref Range Status   Fungus Stain Final report  Final    Comment: (NOTE) Performed At: [facility] [address] [place], [place] [id number] Jennette Shorter MD Ey:[id number]    Fungus (Mycology) Culture PENDING  Incomplete   Fungal Source TISSUE  Corrected    Comment: LEFT HIP B2 Performed at [facility], [address], [place], [place] [address] CORRECTED ON 11/10 AT 2239: PREVIOUSLY REPORTED AS [id number]   Aerobic/Anaerobic Culture w Gram Stain (surgical/deep wound)     Status: None (Preliminary result)   Collection Time: 07/02/24  4:38 PM   Specimen: Soft Tissue, Other  Result Value Ref Range Status   Specimen Description TISSUE LEFT HIP  Final   Special Requests B2 HOLD 2WKS  Final   Gram Stain   Final    FEW WBC PRESENT, PREDOMINANTLY MONONUCLEAR RARE GRAM POSITIVE COCCI    Culture   Final    RARE STAPHYLOCOCCUS EPIDERMIDIS AMONG MIXED SKIN FLORA NO ANAEROBES ISOLATED; CULTURE IN PROGRESS FOR 5 DAYS CONTINUING TO HOLD Performed at [facility], [address], [place], [place] [address]    Report Status PENDING  Incomplete  Acid Fast Smear (AFB)     Status: None   Collection Time: 07/02/24  4:38 PM   Specimen: Soft Tissue, Other  Result Value Ref Range Status   AFB Specimen Processing Comment  Final    Comment: Tissue Grinding and Digestion/Decontamination   Acid Fast Smear Negative  Final    Comment: (NOTE) Performed At: [facility] [address] [place], [place] [id number] Jennette Shorter MD Ey:[id number]    Source (AFB) TISSUE  Corrected    Comment: LEFT HIP B2 Performed at [facility], [address] [place], [place] [address] CORRECTED ON 11/10 AT 2239: PREVIOUSLY REPORTED AS [id number]   Fungus Culture Result     Status: None   Collection Time: 07/02/24  4:38 PM  Result Value Ref Range Status   Result  1 Comment  Final    Comment: (NOTE) KOH/Calcofluor preparation:  no fungus observed. Performed At: [facility] [address] [place], [place] [id number] Jennette Shorter MD Ey:[id number]   Fungus Culture With Stain     Status: None   Collection Time: 07/02/24  4:39 PM   Specimen: Soft Tissue, Other  Result Value Ref Range Status   Fungus Stain Final report  Final    Comment: (NOTE) Performed At: [facility] [address] [place], [place] [id number] Jennette Shorter MD Ey:[id number]    Fungus (Mycology) Culture PENDING  Incomplete   Fungal Source TISSUE  Corrected    Comment: LEFT HIP C3 Performed at [facility], [address] [place], [place] [address] CORRECTED ON 11/10 AT 2240: PREVIOUSLY REPORTED AS [id number]   Aerobic/Anaerobic Culture w Gram Stain (surgical/deep wound)     Status: None (Preliminary result)   Collection Time: 07/02/24  4:39 PM   Specimen: Soft Tissue, Other  Result Value Ref Range Status   Specimen Description TISSUE BONE LEFT HIP  Final   Special Requests C3 HOLD 2WKS  Final   Gram Stain   Final    FEW WBC SEEN RARE GRAM POSITIVE RODS RARE GRAM POSITIVE COCCI    Culture   Final    NO GROWTH 6 DAYS CONTINUING TO HOLD Performed at [facility], [address] [place], [place] [address]    Report Status PENDING  Incomplete  Acid Fast Smear (AFB)     Status: None   Collection Time: 07/02/24  4:39 PM   Specimen: Soft Tissue, Other  Result Value Ref Range Status   AFB Specimen Processing Comment  Final    Comment: Tissue Grinding and Digestion/Decontamination   Acid Fast Smear Negative  Final    Comment: (NOTE) Performed At: [facility] [address] [place], [place] [id number] Jennette Shorter MD Ey:[id number]    Source (AFB) TISSUE  Corrected    Comment: LEFT HIP C3 Performed at [facility], [address] [place], [place] [address] CORRECTED ON 11/10 AT 2240: PREVIOUSLY REPORTED AS [id number]    Fungus Culture Result     Status: None   Collection Time: 07/02/24  4:39 PM  Result Value Ref Range Status   Result 1 Comment  Final    Comment: (NOTE) KOH/Calcofluor preparation:  no fungus observed. Performed At: [facility] [address] [place], [place] [id number] Jennette Shorter MD Ey:[id number]     Spoke with micro, they are unable to run sensi of staph epidermidis on 11/10 1638 hrs and going to send to [facility] for sensi testing   Sensitivities reviewed from 11/10 1636 hrs, MRSE  Plan for 6 weeks of IV daptomycin as below. EOT 12/25 See OPAT below  ID will so for now   OPAT  Diagnosis: Osteomyelitis   Culture Result: MRSE  No Known Allergies  OPAT Orders Discharge antibiotics to be given via PICC line Discharge antibiotics: daptomycin dosing per pharmacy End Date: 08/16/24  [facility] Care Per Protocol:  Home health RN for IV administration and teaching; PICC line care and labs.    Labs weekly while on IV antibiotics: X__ CBC with differential __ BMP X__ CMP X__ CRP X__ ESR __ Vancomycin  trough X__ CK  __ Please pull PIC at completion of IV antibiotics X__ Please leave PIC in place until doctor has seen patient or been notified  Fax weekly labs to [phone]  Clinic Follow Up Appt:12/16  @ 3: 30 pm  Annalee Orem, MD Infectious Disease Physician [facility] [address] [place], [place] [address] Phone: [phone]  Fax: [phone]

## 2024-07-09 DIAGNOSIS — B9562 Methicillin resistant Staphylococcus aureus infection as the cause of diseases classified elsewhere: Secondary | ICD-10-CM | POA: Diagnosis not present

## 2024-07-09 DIAGNOSIS — M1712 Unilateral primary osteoarthritis, left knee: Secondary | ICD-10-CM | POA: Diagnosis not present

## 2024-07-09 DIAGNOSIS — T8452XD Infection and inflammatory reaction due to internal left hip prosthesis, subsequent encounter: Secondary | ICD-10-CM | POA: Diagnosis not present

## 2024-07-09 DIAGNOSIS — H409 Unspecified glaucoma: Secondary | ICD-10-CM | POA: Diagnosis not present

## 2024-07-09 DIAGNOSIS — I959 Hypotension, unspecified: Secondary | ICD-10-CM | POA: Diagnosis not present

## 2024-07-09 DIAGNOSIS — F419 Anxiety disorder, unspecified: Secondary | ICD-10-CM | POA: Diagnosis not present

## 2024-07-09 DIAGNOSIS — K219 Gastro-esophageal reflux disease without esophagitis: Secondary | ICD-10-CM | POA: Diagnosis not present

## 2024-07-09 DIAGNOSIS — I10 Essential (primary) hypertension: Secondary | ICD-10-CM | POA: Diagnosis not present

## 2024-07-09 DIAGNOSIS — Z96642 Presence of left artificial hip joint: Secondary | ICD-10-CM | POA: Diagnosis not present

## 2024-07-09 DIAGNOSIS — M6281 Muscle weakness (generalized): Secondary | ICD-10-CM | POA: Diagnosis not present

## 2024-07-09 DIAGNOSIS — M542 Cervicalgia: Secondary | ICD-10-CM | POA: Diagnosis not present

## 2024-07-09 DIAGNOSIS — E785 Hyperlipidemia, unspecified: Secondary | ICD-10-CM | POA: Diagnosis not present

## 2024-07-09 DIAGNOSIS — M6259 Muscle wasting and atrophy, not elsewhere classified, multiple sites: Secondary | ICD-10-CM | POA: Diagnosis not present

## 2024-07-09 DIAGNOSIS — K5900 Constipation, unspecified: Secondary | ICD-10-CM | POA: Diagnosis not present

## 2024-07-09 DIAGNOSIS — S7292XD Unspecified fracture of left femur, subsequent encounter for closed fracture with routine healing: Secondary | ICD-10-CM | POA: Diagnosis not present

## 2024-07-09 DIAGNOSIS — G4700 Insomnia, unspecified: Secondary | ICD-10-CM | POA: Diagnosis not present

## 2024-07-09 DIAGNOSIS — Z7401 Bed confinement status: Secondary | ICD-10-CM | POA: Diagnosis not present

## 2024-07-09 DIAGNOSIS — F32A Depression, unspecified: Secondary | ICD-10-CM | POA: Diagnosis not present

## 2024-07-09 LAB — BPAM RBC
Blood Product Expiration Date: 202512162359
ISSUE DATE / TIME: 202511141337
Unit Type and Rh: 5100

## 2024-07-09 LAB — TYPE AND SCREEN
ABO/RH(D): O POS
Antibody Screen: NEGATIVE
Unit division: 0

## 2024-07-09 MED ORDER — DAPTOMYCIN IV (FOR PTA / DISCHARGE USE ONLY): 500.0000 mg | INTRAVENOUS | 0 refills | Status: DC

## 2024-07-09 MED ORDER — CHLORHEXIDINE GLUCONATE CLOTH 2 % EX PADS: 6.0000 | MEDICATED_PAD | Freq: Every day | CUTANEOUS | 1 refills | Status: DC

## 2024-07-09 NOTE — Progress Notes (Signed)
 PHARMACY CONSULT NOTE FOR:  OUTPATIENT  PARENTERAL ANTIBIOTIC THERAPY (OPAT)  Informational as noted plans to discharge to SNF  Indication: MRSE L-hip PJI Regimen: Daptomycin 500 mg IV every 24 hours End date: 08/16/24  IV antibiotic discharge orders are pended. To discharging provider:  please sign these orders via discharge navigator,  Select New Orders & click on the button choice - Manage This Unsigned Work.     Thank you for allowing pharmacy to be a part of this patient's care.  Almarie Lunger, PharmD, BCPS, BCIDP Infectious Diseases Clinical Pharmacist 07/09/2024 7:46 AM   **Pharmacist phone directory can now be found on [email or web address] ([email or web address]).  Listed under [facility].

## 2024-07-09 NOTE — Discharge Summary (Signed)
 Physician Discharge Summary  Patient ID: AKIRRA LACERDA MRN: [id number] DOB/AGE: 87/12/1934 87 y.o.  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 Active Problems:   Prosthetic joint infection of left hip   Past Medical History:  Diagnosis Date   Allergy    Anxiety    Arthritis    Calculus of gallbladder and bile duct with obstruction without cholecystitis    Cataract    Choledocholithiasis    Colon cancer (HCC)    Constipation    DDD (degenerative disc disease), lumbar    Depression    Dyspnea    GERD (gastroesophageal reflux disease)    Glaucoma    Headache    Hip fracture (HCC) 12/25/2021   Hip fracture requiring operative repair (HCC) 2023   History of hiatal hernia    HLD (hyperlipidemia)    Hypertension    Interstitial lung disease (HCC)    Left hip postoperative wound infection    Obstructive sleep apnea 01/29/2014   No Cpap   Osteomyelitis (HCC)    Osteomyelitis (HCC) 06/24/2022   Osteoporosis    Rhabdomyolysis    Severe sepsis (HCC) 05/13/2023   Shingles     Surgeries: Procedure(s): CONVERSION, PREVIOUS HIP SURGERY, TO TOTAL HIP ARTHROPLASTY on 07/02/2024   Consultants (if any):   Discharged Condition: Improved  Hospital Course: DANILLE OPPEDISANO is an 89 y.o. 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  Initially slow to mobilize, though participation improved with physical therapy.  Mild post-operative anemia requiring 1 unit transfusion, hemoglobin stabilized and patient has remained hemodynamically stable.  Intra-op cultures showed some positives, and infectious disease was consulted.  See separate notes.  PICC line placed and antibiotics started, patient  will continue these outpatient and follow up with ID who will be managing.  SNF was recommended by physical therapy upon discharge and this was coordinated by [facility] team.  Patient stabilized for discharge.  She was given perioperative antibiotics:  Anti-infectives (From admission, onward)    Start     Dose/Rate Route Frequency Ordered Stop   07/09/24 0000  daptomycin (CUBICIN) IVPB        500 mg Intravenous Every 24 hours 07/09/24 1328 08/16/24 2359   07/05/24 2200  cefadroxil  (DURICEF) capsule 500 mg  Status:  Discontinued        500 mg Oral 2 times daily 07/02/24 2048 07/05/24 0820   07/05/24 1000  DAPTOmycin (CUBICIN) IVPB 500 mg/74mL premix        8 mg/kg  67.1 kg 100 mL/hr over 30 Minutes Intravenous Daily 07/05/24 0849     07/04/24 0000  cefadroxil  (DURICEF) 500 MG capsule        500 mg Oral 2 times daily 07/04/24 1501 07/11/24 2359   07/03/24 2100  cefadroxil  (DURICEF) capsule 500 mg  Status:  Discontinued        500 mg Oral 2 times daily 07/02/24 2048 07/02/24 2050   07/02/24 2200  ceFAZolin  (ANCEF ) IVPB 2g/100 mL premix  Status:  Discontinued        2 g 200 mL/hr over 30 Minutes Intravenous Every 8 hours 07/02/24 2052 07/05/24 0820   07/02/24 1115  ceFAZolin  (ANCEF ) IVPB 2g/100 mL premix  2 g 200 mL/hr over 30 Minutes Intravenous On call to O.R. 07/02/24 1105 07/02/24 1414   07/02/24 0000  cefadroxil  (DURICEF) 500 MG capsule  Status:  Discontinued        500 mg Oral 2 times daily 07/02/24 1954 07/04/24      .  She was given sequential compression devices, early ambulation, and Aspirin  for DVT prophylaxis.  She benefited maximally from the hospital stay and there were no complications.    Recent vital signs:  Vitals:   07/09/24 0603 07/09/24 1308  BP: 115/85 94/69  Pulse: 82 87  Resp: 15 16  Temp: 98.2 F (36.8 C) 98.3 F (36.8 C)  SpO2: 94% 95%    Recent laboratory studies:  Lab Results  Component Value Date   HGB 8.2 (L) 07/08/2024   HGB 7.6 (L)  07/06/2024   HGB 6.8 (LL) 07/06/2024   Lab Results  Component Value Date   WBC 9.0 07/08/2024   PLT 313 07/08/2024   Lab Results  Component Value Date   INR 1.3 (H) 05/13/2023   Lab Results  Component Value Date   NA 129 (L) 07/06/2024   K 4.2 07/06/2024   CL 96 (L) 07/06/2024   CO2 25 07/06/2024   BUN 8 07/06/2024   CREATININE 0.43 (L) 07/06/2024   GLUCOSE 105 (H) 07/06/2024    Discharge Medications:   Allergies as of 07/09/2024   No Known Allergies      Medication List     TAKE these medications    acetaminophen  500 MG tablet Commonly known as: TYLENOL  Take 2 tablets (1,000 mg total) by mouth every 8 (eight) hours as needed.   aspirin  EC 81 MG tablet Take 1 tablet (81 mg total) by mouth 2 (two) times daily for 28 days. Swallow whole.   brimonidine  0.2 % ophthalmic solution Commonly known as: ALPHAGAN  Place 1 drop into both eyes See admin instructions. One drop in each eye twice a day at 8am and 3pm   cefadroxil  500 MG capsule Commonly known as: DURICEF Take 1 capsule (500 mg total) by mouth 2 (two) times daily for 7 days.   celecoxib 100 MG capsule Commonly known as: CeleBREX Take 1 capsule (100 mg total) by mouth 2 (two) times daily for 14 days.   chlorhexidine  4 % external liquid Commonly known as: HIBICLENS  Apply 15 mLs (1 Application total) topically as directed for 30 doses. Use as directed daily for 5 days every other week for 6 weeks.   Chlorhexidine  Gluconate Cloth 2 % Pads Apply 6 each topically daily. Start taking on: July 10, 2024   daptomycin IVPB Commonly known as: CUBICIN Inject 500 mg into the vein daily. Indication:  MRSE L-hip PJI First Dose: Yes Last Day of Therapy:  08/16/24 Labs - Once weekly:  CBC/D, BMP, and CPK Labs - Once weekly: ESR and CRP Method of administration: IV Push or per SNF protocol (IVPB or IV infusion)   docusate sodium  100 MG capsule Commonly known as: COLACE Take 100 mg by mouth every other day.    dorzolamide-timolol 2-0.5 % ophthalmic solution Commonly known as: COSOPT Place 1 drop into both eyes See admin instructions. One drop in each eye twice a day at 8am and 3pm   feeding supplement Liqd Take 237 mLs by mouth 2 (two) times daily between meals.   Guaifenesin  1200 MG Tb12 Take 1,200 mg by mouth 2 (two) times daily.   latanoprost  0.005 % ophthalmic solution Commonly known as: XALATAN  Place 1  drop into both eyes at bedtime.   losartan  100 MG tablet Commonly known as: COZAAR  TAKE 1 TABLET (100 MG TOTAL) BY MOUTH ONCE DAILY FOR 90 DAYS   lovastatin 40 MG tablet Commonly known as: MEVACOR TAKE 1 TABLET (40 MG TOTAL) BY MOUTH DAILY WITH DINNER FOR 90 DAYS   metoprolol  tartrate 25 MG tablet Commonly known as: LOPRESSOR  Take 0.5 tablets (12.5 mg total) by mouth 2 (two) times daily.   montelukast  10 MG tablet Commonly known as: SINGULAIR  TAKE 1 TABLET BY MOUTH EVERYDAY AT BEDTIME   multivitamin tablet Take 1 tablet by mouth daily.   mupirocin ointment 2 % Commonly known as: BACTROBAN Place 1 Application into the nose 2 (two) times daily for 60 doses. Use as directed 2 times daily for 5 days every other week for 6 weeks.   omeprazole  40 MG capsule Commonly known as: PRILOSEC TAKE 1 CAPSULE BY MOUTH AT BEDTIME.   ondansetron  4 MG tablet Commonly known as: Zofran  Take 1 tablet (4 mg total) by mouth every 8 (eight) hours as needed for up to 14 days for nausea or vomiting.   oxyCODONE  5 MG immediate release tablet Commonly known as: Roxicodone  Take 1 tablet (5 mg total) by mouth every 4 (four) hours as needed for up to 7 days for severe pain (pain score 7-10) or moderate pain (pain score 4-6).   polyethylene glycol 17 g packet Commonly known as: MiraLax  Take 17 g by mouth daily.   sertraline  50 MG tablet Commonly known as: ZOLOFT  TAKE 1 TABLET BY MOUTH EVERY DAY   tiZANidine  4 MG tablet Commonly known as: ZANAFLEX  Take 0.5-1.5 tablets (2-6 mg total) by mouth  every 8 (eight) hours as needed for muscle spasms (muscle tightness).   traZODone  50 MG tablet Commonly known as: DESYREL  Take 1 tablet (50 mg total) by mouth at bedtime.   Vitamin D -3 25 MCG (1000 UT) Caps Take 1,000 Units by mouth in the morning.               Home Infusion Instuctions  (From admission, onward)           Start     Ordered   07/09/24 0000  Home infusion instructions       Comments: To be given at the patient's SNF  Question:  Instructions  Answer:  Flushing of vascular access device: 0.9% NaCl pre/post medication administration and prn patency; Heparin  100 u/ml, 5ml for implanted ports and Heparin  10u/ml, 5ml for all other central venous catheters.   07/09/24 1328            Diagnostic Studies: US  EKG SITE RITE Result Date: 07/06/2024 If Site Rite image not attached, placement could not be confirmed due to current cardiac rhythm.  DG HIP UNILAT W OR W/O PELVIS 2-3 VIEWS LEFT Result Date: 07/02/2024 EXAM: 2 OR MORE VIEW(S) XRAY OF THE LEFT HIP 07/02/2024 07:35:00 PM COMPARISON: None available. CLINICAL HISTORY: Post-operative state FINDINGS: BONES AND JOINTS: Revision left total hip arthroplasty with long femoral stem in place. Residual screw fragment is noted in the distal femur. SOFT TISSUES: Soft tissue gas consistent with recent surgery. IMPRESSION: 1. Revision left total hip arthroplasty with long femoral stem in place. 2. Residual screw fragment in the distal femur. Electronically signed by: Oneil Devonshire MD 07/02/2024 08:11 PM EST RP Workstation: [id number]   DG HIP UNILAT WITH PELVIS 1V LEFT Result Date: 07/02/2024 EXAM: XR Intraoperative Imaging C arm, Left Femur, 1 View. TECHNIQUE: Fluoroscopy was provided by  the radiology department for procedure. Radiologist was not present during examination. FLUOROSCOPY DOSE AND TYPE: Radiation Dose Index: Reference Air Kerma (in mGy) = 2.5 Fluoroscopy Time: 31 seconds Number of Images: 5 COMPARISON:  04/13/2024 CLINICAL HISTORY: 886218 Surgery, elective 886218. Intraprocedural imaging. FINDINGS: Intraoperative fluoroscopic imaging was performed. Initial images demonstrate removal of a medullary rod in the Left Femur. A screw fragment is noted in the distal Left Femur. A reaming device is noted in the mid Femur with removal of the proximal Femur identified. An acetabular component is noted in satisfactory position. Completion image was not performed. IMPRESSION: 1. Intraoperative removal of left femoral medullary rod with residual distal screw fragment and mid-femoral reaming device noted on limited images. 2. Acetabular component in satisfactory position. NOTE: Intraoperative fluoroscopic spot images as above. Please refer to the intraoperative report for full details. Electronically signed by: Oneil Devonshire MD 07/02/2024 08:02 PM EST RP Workstation: [email or web address] BARE C-Arm 1-60 Min-No Report Result Date: 07/02/2024 Fluoroscopy was utilized by the requesting physician.  No radiographic interpretation.   DG C-Arm 1-60 Min-No Report Result Date: 07/02/2024 Fluoroscopy was utilized by the requesting physician.  No radiographic interpretation.   DG C-Arm 1-60 Min-No Report Result Date: 07/02/2024 Fluoroscopy was utilized by the requesting physician.  No radiographic interpretation.    Disposition: Discharge disposition: 01-Home or Self Care       Discharge Instructions     Call MD / Call 911   Complete by: As directed    If you experience chest pain or shortness of breath, CALL 911 and be transported to the hospital emergency room.  If you develope a fever above 101 F, pus (white drainage) or increased drainage or redness at the wound, or calf pain, call your surgeon's office.   Constipation Prevention   Complete by: As directed    Drink plenty of fluids.  Prune juice may be helpful.  You may use a stool softener, such as Colace (over the counter) 100 mg twice a day.  Use MiraLax  (over the  counter) for constipation as needed.   Diet - low sodium heart healthy   Complete by: As directed    Driving restrictions   Complete by: As directed    No driving for 4-6 weeks   Follow the hip precautions as taught in Physical Therapy   Complete by: As directed    POSTERIOR HIP PRECAUTIONS x 6 WEEKS   Home infusion instructions   Complete by: As directed    To be given at the patient's SNF   Instructions: Flushing of vascular access device: 0.9% NaCl pre/post medication administration and prn patency; Heparin  100 u/ml, 5ml for implanted ports and Heparin  10u/ml, 5ml for all other central venous catheters.   Increase activity slowly as tolerated   Complete by: As directed    Post-operative opioid taper instructions:   Complete by: As directed    POST-OPERATIVE OPIOID TAPER INSTRUCTIONS: It is important to wean off of your opioid medication as soon as possible. If you do not need pain medication after your surgery it is ok to stop day one. Opioids include: Codeine, Hydrocodone (Norco, Vicodin), Oxycodone (Percocet, oxycontin ) and hydromorphone amongst others.  Long term and even short term use of opiods can cause: Increased pain response Dependence Constipation Depression Respiratory depression And more.  Withdrawal symptoms can include Flu like symptoms Nausea, vomiting And more Techniques to manage these symptoms Hydrate well Eat regular healthy meals Stay active Use relaxation techniques(deep breathing, meditating, yoga) Do Not substitute Alcohol  to help with tapering If you have been on opioids for less than two weeks and do not have pain than it is ok to stop all together.  Plan to wean off of opioids This plan should start within one week post op of your joint replacement. Maintain the same interval or time between taking each dose and first decrease the dose.  Cut the total daily intake of opioids by one tablet each day Next start to increase the time between  doses. The last dose that should be eliminated is the evening dose.      TED hose   Complete by: As directed    Use stockings (TED hose) for 2 weeks on both leg(s).  Then for 2 more weeks on the surgical leg.  You may remove them at night for sleeping.        Contact information for follow-up providers     Edna Toribio LABOR, MD Follow up on 07/17/2024.   Specialty: Orthopedic Surgery Why: Follow up in the office 1 week from surgery for dressing change, wound eval, and x-rays Contact information: [address] [place] [address] [phone]              Contact information for after-discharge care     Destination     [facility] .   Service: Skilled Nursing Contact information: [address] [place]  [phone]                        Discharge Instructions      INSTRUCTIONS AFTER JOINT REPLACEMENT   Remove items at home which could result in a fall. This includes throw rugs or furniture in walking pathways ICE to the affected joint every three hours while awake for 30 minutes at a time, for at least the first 3-5 days, and then as needed for pain and swelling.  Continue to use ice for pain and swelling. You may notice swelling that will progress down to the foot and ankle.  This is normal after surgery.  Elevate your leg when you are not up walking on it.   Continue to use the breathing machine you got in the hospital (incentive spirometer) which will help keep your temperature down.  It is common for your temperature to cycle up and down following surgery, especially at night when you are not up moving around and exerting yourself.  The breathing machine keeps your lungs expanded and your temperature down.  DIET:  As you were doing prior to hospitalization, we recommend a well-balanced diet.  DRESSING / WOUND CARE / SHOWERING:  Keep the surgical dressing until follow up.  Light showers okay.  The dressing is water resistant, however it is best to shower with an extra covering, such as saran wrap to keep it dry.  DO NOT SUBMERGE UNDERWATER.    IF THE DRESSING FALLS OFF or the wound gets wet inside, change the dressing with sterile gauze and call our office for further instruction.  Please use good hand washing techniques before changing the dressing.  Do not use any lotions or creams on the incision until instructed by your surgeon.     ACTIVITY  Increase activity slowly as tolerated, but follow the weight bearing instructions below.   No driving for 6 weeks or until further direction given by your physician.  You cannot drive while taking narcotics.  No lifting or carrying greater than 10 lbs. until  further directed by your surgeon. Avoid periods of inactivity such as sitting longer than an hour when not asleep. This helps prevent blood clots.  You may return to work once you are authorized by your doctor.   WEIGHT BEARING: Weight bearing as tolerated with assist device AND 6 WEEKS OF POSTERIOR HIP PRECAUTIONS (walker, cane, etc) as directed, use it as long as suggested by your surgeon or therapist, typically at least 4-6 weeks.  EXERCISES  Results after joint replacement surgery are often greatly improved when you follow the exercise, range of motion and muscle strengthening exercises prescribed by your doctor. Safety measures are also important to protect the joint from further injury. Any time any of these exercises cause you to have increased pain or swelling, decrease what you are doing until you are comfortable again and then slowly increase them. If you have problems or questions, call your caregiver or physical therapist for advice.   Rehabilitation is important following a joint replacement. After just a few days of immobilization, the muscles of the leg can become weakened and shrink (atrophy).  These exercises are designed to build up the tone and strength of the thigh and  leg muscles and to improve motion. Often times heat used for twenty to thirty minutes before working out will loosen up your tissues and help with improving the range of motion but do not use heat for the first two weeks following surgery (sometimes heat can increase post-operative swelling).   These exercises can be done on a training (exercise) mat, on the floor, on a table or on a bed. Use whatever works the best and is most comfortable for you.    Use music or television while you are exercising so that the exercises are a pleasant break in your day. This will make your life better with the exercises acting as a break in your routine that you can look forward to.   Perform all exercises about fifteen times, three times per day or as directed.  You should exercise both the operative leg and the other leg as well.  Exercises include:   Quad Sets - Tighten up the muscle on the front of the thigh (Quad) and hold for 5-10 seconds.   Straight Leg Raises - With your knee straight (if you were given a brace, keep it on), lift the leg to 60 degrees, hold for 3 seconds, and slowly lower the leg.  Perform this exercise against resistance later as your leg gets stronger.  Leg Slides: Lying on your back, slowly slide your foot toward your buttocks, bending your knee up off the floor (only go as far as is comfortable). Then slowly slide your foot back down until your leg is flat on the floor again.  Angel Wings: Lying on your back spread your legs to the side as far apart as you can without causing discomfort.  Hamstring Strength:  Lying on your back, push your heel against the floor with your leg straight by tightening up the muscles of your buttocks.  Repeat, but this time bend your knee to a comfortable angle, and push your heel against the floor.  You may put a pillow under the heel to make it more comfortable if necessary.   A rehabilitation program following joint replacement surgery can speed recovery and  prevent re-injury in the future due to weakened muscles. Contact your doctor or a physical therapist for more information on knee rehabilitation.   CONSTIPATION:  Constipation is defined medically as  fewer than three stools per week and severe constipation as less than one stool per week.  Even if you have a regular bowel pattern at home, your normal regimen is likely to be disrupted due to multiple reasons following surgery.  Combination of anesthesia, postoperative narcotics, change in appetite and fluid intake all can affect your bowels.   YOU MUST use at least one of the following options; they are listed in order of increasing strength to get the job done.  They are all available over the counter, and you may need to use some, POSSIBLY even all of these options:    Drink plenty of fluids (prune juice may be helpful) and high fiber foods Colace 100 mg by mouth twice a day  Senokot for constipation as directed and as needed Dulcolax (bisacodyl ), take with full glass of water  Miralax  (polyethylene glycol) once or twice a day as needed.  If you have tried all these things and are unable to have a bowel movement in the first 3-4 days after surgery call either your surgeon or your primary doctor.    If you experience loose stools or diarrhea, hold the medications until you stool forms back up.  If your symptoms do not get better within 1 week or if they get worse, check with your doctor.  If you experience the worst abdominal pain ever or develop nausea or vomiting, please contact the office immediately for further recommendations for treatment.  ITCHING:  If you experience itching with your medications, try taking only a single pain pill, or even half a pain pill at a time.  You can also use Benadryl over the counter for itching or also to help with sleep.   TED HOSE STOCKINGS:  Use stockings on both legs until for at least 2 weeks or as directed by physician office. They may be removed at night  for sleeping.  MEDICATIONS:  See your medication summary on the "After Visit Summary" that nursing will review with you.  You may have some home medications which will be placed on hold until you complete the course of blood thinner medication.  It is important for you to complete the blood thinner medication as prescribed.  Blood clot prevention (DVT Prophylaxis): After surgery you are at an increased risk for a blood clot.  You were prescribed a blood thinner, Aspirin  81mg , to be taken twice daily for a total of 4 weeks from surgery to help reduce your risk of getting a blood clot.  Signs of a pulmonary embolus (blood clot in the lungs) include sudden short of breath, feeling lightheaded or dizzy, chest pain with a deep breath, rapid pulse rapid breathing.  Signs of a blood clot in your arms or legs include new unexplained swelling and cramping, warm, red or darkened skin around the painful area.  Please call the office or 911 right away if these signs or symptoms develop.  PRECAUTIONS:   If you experience chest pain or shortness of breath - call 911 immediately for transfer to the hospital emergency department.   If you develop a fever greater that 101 F, purulent drainage from wound, increased redness or drainage from wound, foul odor from the wound/dressing, or calf pain - CONTACT YOUR SURGEON.  FOLLOW-UP APPOINTMENTS:  If you do not already have a post-op appointment, please call the office for an appointment to be seen by your surgeon.  Guidelines for how soon to be seen are listed in your "After Visit Summary", but are typically between 2-3 weeks after surgery.  If you have a specialized bandage, you may be told to follow up 1 week after surgery.  POST-OPERATIVE OPIOID TAPER INSTRUCTIONS: It is important to wean off of your opioid medication as soon as possible. If you do not need pain medication after your surgery it is ok to stop day  one. Opioids include: Codeine, Hydrocodone (Norco, Vicodin), Oxycodone (Percocet, oxycontin ) and hydromorphone amongst others.  Long term and even short term use of opiods can cause: Increased pain response Dependence Constipation Depression Respiratory depression And more.  Withdrawal symptoms can include Flu like symptoms Nausea, vomiting And more Techniques to manage these symptoms Hydrate well Eat regular healthy meals Stay active Use relaxation techniques(deep breathing, meditating, yoga) Do Not substitute Alcohol to help with tapering If you have been on opioids for less than two weeks and do not have pain than it is ok to stop all together.  Plan to wean off of opioids This plan should start within one week post op of your joint replacement. Maintain the same interval or time between taking each dose and first decrease the dose.  Cut the total daily intake of opioids by one tablet each day Next start to increase the time between doses. The last dose that should be eliminated is the evening dose.   MAKE SURE YOU:  Understand these instructions.  Get help right away if you are not doing well or get worse.    Thank you for letting us  be a part of your medical care team.  It is a privilege we respect greatly.  We hope these instructions will help you stay on track for a fast and full recovery!            Signed: Bernarda CHRISTELLA Mclean 07/09/2024, 1:28 PM

## 2024-07-09 NOTE — TOC Progression Note (Signed)
 Transition of Care [facility]) - Progression Note    Patient Details  Name: Melanie Carrillo MRN: [id number] Date of Birth: 1935/04/19  Transition of Care [facility]) CM/SW Contact  Alfonse JONELLE Rex, RN Phone Number: 07/09/2024, 9:47 AM  Clinical Narrative:   ID recommendation for Daptomycin 500mg  IV q 24 hrs, end date 08/16/24. Call to [facility] admissions, spoke with Adrien,  reviewed need for iv abx with PICC line in place. Adrien states she will confirm with their pharmacy that facility can provided the iv abx, waiting call back.     Expected Discharge Plan: Skilled Nursing Facility Barriers to Discharge: SNF Pending bed offer, Insurance Authorization               Expected Discharge Plan and Services     Post Acute Care Choice: Skilled Nursing Facility                                         Social Drivers of Health (SDOH) Interventions SDOH Screenings   Food Insecurity: No Food Insecurity (07/02/2024)  Housing: Low Risk  (07/02/2024)  Transportation Needs: No Transportation Needs (07/02/2024)  Utilities: Not At Risk (07/02/2024)  Alcohol Screen: Low Risk  (11/26/2022)  Depression (PHQ2-9): Low Risk  (04/12/2024)  Financial Resource Strain: Low Risk  (03/22/2024)  Physical Activity: Inactive (03/22/2024)  Social Connections: Unknown (07/02/2024)  Stress: No Stress Concern Present (03/22/2024)  Tobacco Use: Low Risk  (07/02/2024)  Health Literacy: Inadequate Health Literacy (02/20/2024)    Readmission Risk Interventions    07/03/2024   12:01 PM  Readmission Risk Prevention Plan  Post Dischage Appt Complete  Medication Screening Complete  Transportation Screening Complete

## 2024-07-09 NOTE — Progress Notes (Signed)
 Occupational Therapy Treatment Patient Details Name: Melanie Carrillo MRN: [id number] DOB: September 11, 1934 Today's Date: 07/09/2024   History of present illness Pt is an 89y.o. 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 PMH significant for, but not limited to: anxiety, depression, colon cancer, DDD, and GERD.   OT comments  Patient seen for skilled OT session this afternoon. Patient open to all therapy presented and accessed bathroom for regular toilet use and sink side ADL's with RW. See below for current functional status. Patient requires continued Acute care hospital level OT services to progress safety and functional performance and allow for discharge with post discharge recommendation for continued inpatient follow up therapy, <3 hours/day.         If plan is discharge home, recommend the following:  A little help with walking and/or transfers;A lot of help with bathing/dressing/bathroom;Assistance with cooking/housework;Assist for transportation;Help with stairs or ramp for entrance   Equipment Recommendations  None recommended by OT       Precautions / Restrictions Precautions Precautions: Fall;Posterior Hip Precaution Booklet Issued: Yes (comment) Recall of Precautions/Restrictions: Impaired Precaution/Restrictions Comments: PICC line R inner arm Restrictions Weight Bearing Restrictions Per Provider Order: No LLE Weight Bearing Per Provider Order: Weight bearing as tolerated Other Position/Activity Restrictions: WBAT       Mobility Bed Mobility Overal bed mobility: Needs Assistance Bed Mobility: Supine to Sit, Sit to Supine     Supine to sit: HOB elevated, Used rails, Contact guard Sit to supine: HOB elevated, Used rails, Contact guard assist   General bed mobility comments: Cues for safety, pt did adher to precautions. Increased time and  effort for pt.    Transfers Overall transfer level: Needs assistance Equipment used: Rolling walker (2 wheels) Transfers: Sit to/from Stand Sit to Stand: Contact guard assist     Step pivot transfers: Min assist     General transfer comment: cues for safety, technique, hand/LE placement with access to regular toilet with RW     Balance Overall balance assessment: Needs assistance Sitting-balance support: No upper extremity supported, Feet supported Sitting balance-Leahy Scale: Good Sitting balance - Comments: sitting on toilet without LOB   Standing balance support: Bilateral upper extremity supported, Reliant on assistive device for balance, During functional activity Standing balance-Leahy Scale: Fair Standing balance comment: sink side grooming with steadying in standing                           ADL either performed or assessed with clinical judgement   ADL Overall ADL's : Needs assistance/impaired Eating/Feeding: Independent   Grooming: Set up;Sitting   Upper Body Bathing: Set up;Sitting   Lower Body Bathing: Sit to/from stand;Moderate assistance   Upper Body Dressing : Sitting;Set up   Lower Body Dressing: Sit to/from stand;Moderate assistance   Toilet Transfer: Rolling walker (2 wheels);Moderate assistance;Regular Toilet;Grab bars   Toileting- Clothing Manipulation and Hygiene: Minimal assistance       Functional mobility during ADLs: Rolling walker (2 wheels);Cueing for sequencing;Contact guard assist General ADL Comments: decreased functional reach to LE's but improving with all ADL's regular bathroom level    Extremity/Trunk Assessment Upper Extremity Assessment Upper Extremity Assessment: Generalized weakness   Lower Extremity Assessment Lower Extremity Assessment: Defer to PT evaluation                 Communication Communication Communication: Impaired Factors Affecting Communication: Hearing  impaired   Cognition Arousal:  Alert Behavior During Therapy: WFL for tasks assessed/performed Cognition: No apparent impairments                               Following commands: Intact        Cueing   Cueing Techniques: Verbal cues  Exercises Other Exercises Other Exercises: incentive spirometer x 10 - able to pull > 1000 ml       General Comments post op incision dressings intact, wound vac has been d/c'd    Pertinent Vitals/ Pain       Pain Assessment Pain Assessment: 0-10 Pain Score: 4  Faces Pain Scale: No hurt Breathing: normal Pain Location: L hip Pain Descriptors / Indicators: Discomfort, Guarding Pain Intervention(s): Limited activity within patient's tolerance, Monitored during session, Repositioned, Patient requesting pain meds-RN notified, Ice applied   Frequency  Min 2X/week        Progress Toward Goals  OT Goals(current goals can now be found in the care plan section)  Progress towards OT goals: Progressing toward goals  Acute Rehab OT Goals Patient Stated Goal: to get to rehab OT Goal Formulation: With patient/family Time For Goal Achievement: 07/18/24 Potential to Achieve Goals: Good ADL Goals Pt Will Perform Lower Body Bathing: with adaptive equipment;with contact guard assist;sit to/from stand;with caregiver independent in assisting Pt Will Perform Lower Body Dressing: with contact guard assist;with adaptive equipment;sit to/from stand;with caregiver independent in assisting Pt Will Transfer to Toilet: with supervision;ambulating;bedside commode Pt Will Perform Toileting - Clothing Manipulation and hygiene: with supervision;sit to/from stand;sitting/lateral leans Additional ADL Goal #1: Pt will follow post hip precautions during ADL task with min vc Additional ADL Goal #2: Pt/caregiver will independently verbalize 3 strategies to reduce risk of falls  Plan         AM-PAC OT 6 Clicks Daily Activity     Outcome Measure   Help from another person eating  meals?: None Help from another person taking care of personal grooming?: A Little Help from another person toileting, which includes using toliet, bedpan, or urinal?: A Lot Help from another person bathing (including washing, rinsing, drying)?: A Lot Help from another person to put on and taking off regular upper body clothing?: A Little Help from another person to put on and taking off regular lower body clothing?: A Lot 6 Click Score: 16    End of Session Equipment Utilized During Treatment: Gait belt;Rolling walker (2 wheels)  OT Visit Diagnosis: Unsteadiness on feet (R26.81);Other abnormalities of gait and mobility (R26.89);Muscle weakness (generalized) (M62.81);History of falling (Z91.81);Pain Pain - Right/Left: Left Pain - part of body: Hip   Activity Tolerance Patient tolerated treatment well   Patient Left in chair;with call bell/phone within reach;with chair alarm set   Nurse Communication Mobility status;Other (comment);Patient requests pain meds (precautions)        Time: 1230-1300 OT Time Calculation (min): 30 min  Charges: OT General Charges $OT Visit: 1 Visit OT Treatments $Self Care/Home Management : 8-22 mins $Therapeutic Activity: 8-22 mins  Hodge Stachnik OT/L Acute Rehabilitation Department  [phone]  07/09/2024, 4:06 PM

## 2024-07-09 NOTE — TOC Transition Note (Signed)
 Transition of Care [facility]) - Discharge Note   Patient Details  Name: Melanie Carrillo MRN: [id number] Date of Birth: [birth year]  Transition of Care [facility]) CM/SW Contact:  Alfonse JONELLE Rex, RN Phone Number: 07/09/2024, 2:03 PM   Clinical Narrative:   DC to SNF-[facility], [place] [address], confirmed with Adrien, admissions coordinator, facility able to accommodate iv abx. Call to pt's dtr, [place] to notify of discharge. [facility] for transport. No further TOC needs identified at this time.     Final next level of care: Skilled Nursing Facility Barriers to Discharge: Barriers Resolved   Patient Goals and CMS Choice Patient states their goals for this hospitalization and ongoing recovery are:: return home CMS Medicare.gov Compare Post Acute Care list provided to:: Patient Represenative (must comment) Choice offered to / list presented to : Adult Children, Patient      Discharge Placement              Patient chooses bed at: [facility] Patient to be transferred to facility by: PT[facility] Name of family member notified: IDA BERWYN CROME (Daughter)  [phone] [facility] Phone) Patient and family notified of of transfer: 07/09/24  Discharge Plan and Services Additional resources added to the After Visit Summary for       Post Acute Care Choice: Skilled Nursing Facility                               Social Drivers of Health (SDOH) Interventions SDOH Screenings   Food Insecurity: No Food Insecurity (07/02/2024)  Housing: Low Risk  (07/02/2024)  Transportation Needs: No Transportation Needs (07/02/2024)  Utilities: Not At Risk (07/02/2024)  Alcohol Screen: Low Risk  (11/26/2022)  Depression (PHQ2-9): Low Risk  (04/12/2024)  Financial Resource Strain: Low Risk  (03/22/2024)  Physical Activity: Inactive (03/22/2024)  Social Connections: Unknown (07/02/2024)  Stress: No Stress Concern Present (03/22/2024)  Tobacco Use: Low Risk  (07/02/2024)  Health Literacy: Inadequate  Health Literacy (02/20/2024)     Readmission Risk Interventions    07/09/2024    2:01 PM 07/03/2024   12:01 PM  Readmission Risk Prevention Plan  Post Dischage Appt Complete Complete  Medication Screening Complete Complete  Transportation Screening Complete Complete

## 2024-07-09 NOTE — Progress Notes (Signed)
    7 Days Post-Op Procedure(s) (LRB): CONVERSION, PREVIOUS HIP SURGERY, TO TOTAL HIP ARTHROPLASTY (Left)  Subjective: Pain mild-moderate this morning. No complaints this morning, eager to discharge to rehab. PICC line in place.  ID consulted plan for 6 weeks of IV antibiotics.  Bandage changed from Woundvac to Aquacal.  Likely dispo to SNF today.   Yesterday's total administered Morphine  Milligram Equivalents: 30   Objective:   VITALS:   Vitals:   07/06/24 1408 07/06/24 1633 07/06/24 2107 07/07/24 0533  BP: (!) 151/56 (!) 154/61 100/69 131/79  Pulse: 84 81 83 78  Resp: 18 19 15 15   Temp: 98 F (36.7 C) 98.3 F (36.8 C) 97.9 F (36.6 C) 98 F (36.7 C)  TempSrc: Oral Oral Oral Oral  SpO2: 94% 93% 94% 95%  Weight:      Height:        Laying in bed in NAD Sensation intact distally Intact pulses distally Dorsiflexion/Plantar flexion intact Incision: dressing C/D/I Compartment soft Wound vac in place maintaining good seal, no fluid collected in cannister -- REMOVED and aquacel placed PICC line in place, RUE Wiggles toes appropriately    Lab Results  Component Value Date   WBC 8.6 07/06/2024   HGB 7.6 (L) 07/06/2024   HCT 22.8 (L) 07/06/2024   MCV 108.4 (H) 07/06/2024   PLT 248 07/06/2024   BMET    Component Value Date/Time   NA 129 (L) 07/06/2024 1015   NA 136 03/26/2024 1614   K 4.2 07/06/2024 1015   CL 96 (L) 07/06/2024 1015   CO2 25 07/06/2024 1015   GLUCOSE 105 (H) 07/06/2024 1015   BUN 8 07/06/2024 1015   BUN 12 03/26/2024 1614   CREATININE 0.43 (L) 07/06/2024 1015   CALCIUM 9.3 07/06/2024 1015   EGFR 78 03/26/2024 1614   GFRNONAA >60 07/06/2024 1015      Xray: Total hip arthroplasty components with proximal femoral replacement in good alignment no adverse features  Assessment/Plan: 6 Days Post-Op   Principal Problem:   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 NGTD.  IntraOp cultures positive for Staph epidermidis. Sample failed to grow for susceptibility testing. Please see ID plan for antibiotics at discharge.  Imaging: PACU pelvis Xray Dressing: prevena wound vac, 07/09/24 CHANGED TO AQUACEL -- keep intact until follow up DVT prophylaxis: Aspirin  81BID starting POD1 Follow up: 2weeks after surgery for a wound check with Dr. Edna at [facility].  Address: [address], [place], [place] [address]  Office Phone: [phone]  Dispo: Hbg stabilized. She's ready to discharge to SNF if bed available and plan in place for IV antibiotics. I have reached out to [facility] about bed placement.  Bandage changed to Aquacel.  F/u next Tuesday in office.    Melanie Carrillo  07/09/2024, 8:55 AM    Contact information:   [id number] 7am-5pm epic message Dr. Edna, or call office for patient follow up: [phone] After hours and holidays please check [email or web address] for group call information for [facility]

## 2024-07-10 DIAGNOSIS — S7292XD Unspecified fracture of left femur, subsequent encounter for closed fracture with routine healing: Secondary | ICD-10-CM | POA: Diagnosis not present

## 2024-07-10 DIAGNOSIS — K219 Gastro-esophageal reflux disease without esophagitis: Secondary | ICD-10-CM | POA: Diagnosis not present

## 2024-07-10 DIAGNOSIS — E785 Hyperlipidemia, unspecified: Secondary | ICD-10-CM | POA: Diagnosis not present

## 2024-07-10 DIAGNOSIS — K5900 Constipation, unspecified: Secondary | ICD-10-CM | POA: Diagnosis not present

## 2024-07-10 DIAGNOSIS — H409 Unspecified glaucoma: Secondary | ICD-10-CM | POA: Diagnosis not present

## 2024-07-10 DIAGNOSIS — F32A Depression, unspecified: Secondary | ICD-10-CM | POA: Diagnosis not present

## 2024-07-10 DIAGNOSIS — I10 Essential (primary) hypertension: Secondary | ICD-10-CM | POA: Diagnosis not present

## 2024-07-10 DIAGNOSIS — F419 Anxiety disorder, unspecified: Secondary | ICD-10-CM | POA: Diagnosis not present

## 2024-07-10 LAB — MISC LABCORP TEST (SEND OUT)

## 2024-07-11 DIAGNOSIS — F419 Anxiety disorder, unspecified: Secondary | ICD-10-CM | POA: Diagnosis not present

## 2024-07-11 DIAGNOSIS — B9562 Methicillin resistant Staphylococcus aureus infection as the cause of diseases classified elsewhere: Secondary | ICD-10-CM | POA: Diagnosis not present

## 2024-07-11 DIAGNOSIS — F32A Depression, unspecified: Secondary | ICD-10-CM | POA: Diagnosis not present

## 2024-07-11 DIAGNOSIS — M6281 Muscle weakness (generalized): Secondary | ICD-10-CM | POA: Diagnosis not present

## 2024-07-11 DIAGNOSIS — M1712 Unilateral primary osteoarthritis, left knee: Secondary | ICD-10-CM | POA: Diagnosis not present

## 2024-07-11 DIAGNOSIS — T8452XD Infection and inflammatory reaction due to internal left hip prosthesis, subsequent encounter: Secondary | ICD-10-CM | POA: Diagnosis not present

## 2024-07-11 DIAGNOSIS — K219 Gastro-esophageal reflux disease without esophagitis: Secondary | ICD-10-CM | POA: Diagnosis not present

## 2024-07-11 DIAGNOSIS — S7292XD Unspecified fracture of left femur, subsequent encounter for closed fracture with routine healing: Secondary | ICD-10-CM | POA: Diagnosis not present

## 2024-07-13 LAB — MINIMUM INHIBITORY CONC. (1 DRUG)

## 2024-07-13 LAB — SUSCEPTIBILITY RESULT

## 2024-07-13 LAB — MIC RESULT

## 2024-07-13 LAB — SUSCEPTIBILITY, AER + ANAEROB

## 2024-07-14 LAB — SUSCEPTIBILITY RESULT

## 2024-07-14 LAB — SUSCEPTIBILITY, AER + ANAEROB

## 2024-07-17 DIAGNOSIS — Z96642 Presence of left artificial hip joint: Secondary | ICD-10-CM | POA: Diagnosis not present

## 2024-07-17 DIAGNOSIS — G4700 Insomnia, unspecified: Secondary | ICD-10-CM | POA: Diagnosis not present

## 2024-07-17 DIAGNOSIS — K219 Gastro-esophageal reflux disease without esophagitis: Secondary | ICD-10-CM | POA: Diagnosis not present

## 2024-07-17 LAB — AEROBIC/ANAEROBIC CULTURE W GRAM STAIN (SURGICAL/DEEP WOUND)

## 2024-07-18 LAB — AEROBIC/ANAEROBIC CULTURE W GRAM STAIN (SURGICAL/DEEP WOUND): Culture: NO GROWTH

## 2024-07-24 DIAGNOSIS — S7292XD Unspecified fracture of left femur, subsequent encounter for closed fracture with routine healing: Secondary | ICD-10-CM | POA: Diagnosis not present

## 2024-07-24 DIAGNOSIS — F419 Anxiety disorder, unspecified: Secondary | ICD-10-CM | POA: Diagnosis not present

## 2024-07-25 ENCOUNTER — Telehealth: Payer: Self-pay

## 2024-07-25 NOTE — Telephone Encounter (Signed)
 Received the following message from Holley Herring:  SNF OPAT pt Transition home Received: Nilsa Herring, Sharlet GORMAN Chandra Lorenda CHRISTELLA, RMA; Alasia Enge D, RN; Celestia Lela CHRISTELLA, CMA; Veva, Diminique T, CMA; Dalila Annabella SQUIBB, CMA Hey team. Melanie Carrillo discharged from [facility] on 11/17 and is currently at [facility].  She is planned to DC home later this week. I just wanted to make sure you were aware of the transition to know our team will now be supporting home IVABX and [facility] is the agency for nursing and PT.  Pam

## 2024-07-27 DIAGNOSIS — K219 Gastro-esophageal reflux disease without esophagitis: Secondary | ICD-10-CM | POA: Diagnosis not present

## 2024-07-27 DIAGNOSIS — E785 Hyperlipidemia, unspecified: Secondary | ICD-10-CM | POA: Diagnosis not present

## 2024-07-27 DIAGNOSIS — F419 Anxiety disorder, unspecified: Secondary | ICD-10-CM | POA: Diagnosis not present

## 2024-07-27 DIAGNOSIS — K5900 Constipation, unspecified: Secondary | ICD-10-CM | POA: Diagnosis not present

## 2024-07-27 DIAGNOSIS — H409 Unspecified glaucoma: Secondary | ICD-10-CM | POA: Diagnosis not present

## 2024-07-27 DIAGNOSIS — G4700 Insomnia, unspecified: Secondary | ICD-10-CM | POA: Diagnosis not present

## 2024-07-27 DIAGNOSIS — S7292XD Unspecified fracture of left femur, subsequent encounter for closed fracture with routine healing: Secondary | ICD-10-CM | POA: Diagnosis not present

## 2024-07-27 DIAGNOSIS — F32A Depression, unspecified: Secondary | ICD-10-CM | POA: Diagnosis not present

## 2024-07-30 ENCOUNTER — Telehealth: Payer: Self-pay

## 2024-07-30 DIAGNOSIS — T8452XA Infection and inflammatory reaction due to internal left hip prosthesis, initial encounter: Secondary | ICD-10-CM | POA: Diagnosis not present

## 2024-07-30 NOTE — Transitions of Care (Post Inpatient/ED Visit) (Signed)
   07/30/2024  Name: Melanie Carrillo MRN: [id number] DOB: 04-Jan-1935  Today's TOC FU Call Status: Today's TOC FU Call Status:: Successful TOC FU Call Completed TOC FU Call Complete Date: 07/30/24  Patient's Name and Date of Birth confirmed. Name, DOB  Transition Care Management Follow-up Telephone Call Date of Discharge: 07/29/24 Discharge Facility: Other ([facility]) Name of Other ([facility]) Discharge Facility: [facility] Type of Discharge: Inpatient Admission Primary Inpatient Discharge Diagnosis:: left hip replacement How have you been since you were released from the hospital?: Better Any questions or concerns?: No  Items Reviewed: Did you receive and understand the discharge instructions provided?: Yes Medications obtained,verified, and reconciled?: Yes (Medications Reviewed) Any new allergies since your discharge?: No Dietary orders reviewed?: Yes Do you have support at home?: Yes People in Home [RPT]: child(ren), adult  Medications Reviewed Today: Medications Reviewed Today   Medications were not reviewed in this encounter     Home Care and Equipment/Supplies: Were Home Health Services Ordered?: Yes Name of Home Health Agency:: [facility] Has Agency set up a time to come to your home?: Yes First Home Health Visit Date: 07/30/24 Any new equipment or medical supplies ordered?: NA  Functional Questionnaire: Do you need assistance with bathing/showering or dressing?: Yes Do you need assistance with meal preparation?: Yes Do you need assistance with eating?: No Do you have difficulty maintaining continence: No Do you need assistance with getting out of bed/getting out of a chair/moving?: Yes Do you have difficulty managing or taking your medications?: No  Follow up appointments reviewed: PCP Follow-up appointment confirmed?: Yes Date of PCP follow-up appointment?: 08/02/24 Follow-up Provider: [facility] Follow-up appointment  confirmed?: NA Do you need transportation to your follow-up appointment?: No Do you understand care options if your condition(s) worsen?: Yes-patient verbalized understanding    SIGNATURE Julian Lemmings, LPN [facility] Nurse Health Advisor Direct Dial [phone]

## 2024-07-31 DIAGNOSIS — Z961 Presence of intraocular lens: Secondary | ICD-10-CM | POA: Diagnosis not present

## 2024-07-31 DIAGNOSIS — H401111 Primary open-angle glaucoma, right eye, mild stage: Secondary | ICD-10-CM | POA: Diagnosis not present

## 2024-07-31 DIAGNOSIS — H401123 Primary open-angle glaucoma, left eye, severe stage: Secondary | ICD-10-CM | POA: Diagnosis not present

## 2024-08-01 LAB — FUNGUS CULTURE RESULT

## 2024-08-01 LAB — FUNGUS CULTURE WITH STAIN

## 2024-08-01 LAB — FUNGAL ORGANISM REFLEX

## 2024-08-02 ENCOUNTER — Encounter: Payer: Self-pay | Admitting: Family Medicine

## 2024-08-02 ENCOUNTER — Ambulatory Visit (INDEPENDENT_AMBULATORY_CARE_PROVIDER_SITE_OTHER): Admitting: Family Medicine

## 2024-08-02 VITALS — BP 109/71 | HR 67 | Temp 97.9°F | Wt 156.0 lb

## 2024-08-02 DIAGNOSIS — A498 Other bacterial infections of unspecified site: Secondary | ICD-10-CM

## 2024-08-02 DIAGNOSIS — Z96642 Presence of left artificial hip joint: Secondary | ICD-10-CM

## 2024-08-02 DIAGNOSIS — T8452XD Infection and inflammatory reaction due to internal left hip prosthesis, subsequent encounter: Secondary | ICD-10-CM

## 2024-08-02 DIAGNOSIS — J9601 Acute respiratory failure with hypoxia: Secondary | ICD-10-CM

## 2024-08-02 DIAGNOSIS — R0602 Shortness of breath: Secondary | ICD-10-CM

## 2024-08-02 NOTE — Progress Notes (Unsigned)
 Established patient visit   Patient: Melanie Carrillo   DOB: 1934/11/27   89 y.o. Female  MRN: [id number] Visit Date: 08/02/2024  Today's healthcare provider: Rockie Agent, MD   Chief Complaint  Patient presents with   Hospitalization Follow-up    Admit date: 07/02/2024 Discharge date: 07/09/2024 Patient was admitted at [facility] and was discharged from [facility]   Subjective     HPI     Hospitalization Follow-up    Additional comments: Admit date: 07/02/2024 Discharge date: 07/09/2024 Patient was admitted at [facility] and was discharged from [facility]      Last edited by Ellery Gerald BRAVO, CMA on 08/02/2024  2:02 PM.       Discussed the use of AI scribe software for clinical note transcription with the patient, who gave verbal consent to proceed.  History of Present Illness Melanie Carrillo is an 89 year old female with a history of left femur fracture and prosthetic joint infection who presents for hospital follow-up.  She was admitted to the hospital from November 10th to November 17th for a closed fracture of the left femur, which required an arthroplasty. 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 She has a PICC line and has been on daptomycin  500 mg IV for MRSE. Cultures taken were positive, and she has been on antibiotics since discharge.  She is slow to mobilize post-surgery but has shown improvement with physical therapy. She required one unit of blood during her hospital stay, and her hemoglobin levels have since stabilized. She is currently taking Zanaflex  4 mg (half to one and a half tablets), cefadroxil  500 mg twice daily, Celebrex  100 mg twice daily, and daptomycin  500 mg IV.  She experiences increased shortness of breath, especially after ambulation, and her daughter notes that she becomes short of breath after walking short  distances, such as from the car to the house. She and her daughter have also noticed swelling in her legs. Her daughter mentions that she becomes short of breath after walking short distances, such as from the car to the house. She has a history of interstitial lung disease. No recent chest imaging has been performed since her hospital stay, and there is uncertainty about whether a chest x-ray was performed during her admission.  TOC CALL completed 07/30/24   Past Medical History:  Diagnosis Date   Allergy    Anxiety    Arthritis    Calculus of gallbladder and bile duct with obstruction without cholecystitis    Cataract    Choledocholithiasis    Colon cancer (HCC)    Constipation    DDD (degenerative disc disease), lumbar    Depression    Dyspnea    GERD (gastroesophageal reflux disease)    Glaucoma    Headache    Hip fracture (HCC) 12/25/2021   Hip fracture requiring operative repair (HCC) 2023   History of hiatal hernia    HLD (hyperlipidemia)    Hypertension    Interstitial lung disease (HCC)    Left hip postoperative wound infection    Obstructive sleep apnea 01/29/2014   No Cpap   Osteomyelitis (HCC)    Osteomyelitis (HCC) 06/24/2022   Osteoporosis    Rhabdomyolysis    Severe sepsis (HCC) 05/13/2023   Shingles     Medications: Show/hide medication list[1]  Review of Systems  Last metabolic panel  Lab Results  Component Value Date   GLUCOSE 105 (H) 07/06/2024   NA 129 (L) 07/06/2024   K 4.2 07/06/2024   CL 96 (L) 07/06/2024   CO2 25 07/06/2024   BUN 8 07/06/2024   CREATININE 0.43 (L) 07/06/2024   GFRNONAA >60 07/06/2024   CALCIUM 9.3 07/06/2024   PROT 7.8 06/22/2024   ALBUMIN  4.1 06/22/2024   LABGLOB 3.1 03/26/2024   AGRATIO 1.4 11/26/2022   BILITOT 0.4 06/22/2024   ALKPHOS 67 06/22/2024   AST 24 06/22/2024   ALT 8 06/22/2024   ANIONGAP 8 07/06/2024   Last lipids Lab Results  Component Value Date   CHOL 148 03/26/2024   HDL 72 03/26/2024    LDLCALC 58 03/26/2024   TRIG 98 03/26/2024   CHOLHDL 2.1 03/26/2024   Last hemoglobin A1c Lab Results  Component Value Date   HGBA1C 5.4 03/26/2024   Last thyroid  functions Lab Results  Component Value Date   TSH 2.300 01/05/2024   FREET4 1.11 01/05/2024        Objective    BP 109/71 (BP Location: Left Arm, Patient Position: Sitting, Cuff Size: Large)   Pulse 67   Temp 97.9 F (36.6 C) (Oral)   Wt 156 lb (70.8 kg)   LMP  (LMP Unknown)   SpO2 98%   BMI 26.78 kg/m   BP Readings from Last 3 Encounters:  08/02/24 109/71  07/09/24 94/69  06/22/24 128/83   Wt Readings from Last 3 Encounters:  08/02/24 156 lb (70.8 kg)  07/02/24 148 lb (67.1 kg)  06/22/24 148 lb (67.1 kg)        Physical Exam  Physical Exam VITALS: P- 148, BP- 109/71, SaO2- 81% GENERAL: tired appearing, non toxic appearing, ambulates with assistance of walker CHEST: Crackles at right lower lung base. Increased respiratory effort with movement on RA  CARDIOVASCULAR: Regular rate and rhythm.     No results found for any visits on 08/02/24.  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 Currently on daptomycin  500 mg IV for MRSE. Follow-up with infectious disease is ongoing. - Continue daptomycin  500 mg IV until December 25th - Follow  up with infectious disease for further management  Chronic respiratory failure with hypoxia Increased shortness of breath and desaturation during ambulation. Oxygen saturation dropped to 81-82% during ambulation, with recovery to 94-95% after rest. Heart rate elevated to 148 bpm during ambulation. Chronic respiratory failure suspected. - Ordered chest x-ray to evaluate for pneumonia or other pulmonary issues - Initiated supplemental oxygen therapy with a goal of maintaining oxygen saturations greater than 88% - Performed walk test to assess oxygen saturation during ambulation  Interstitial lung  disease Chronic  Contributing to respiratory symptoms. No recent chest imaging since May. Current symptoms may be exacerbated by decreased mobility post-surgery. - Ordered chest x-ray to assess current lung status - Will consider referral to pulmonology for further evaluation and management  Presence of left artificial hip joint Left hip prosthesis in place following revision surgery for infection. Currently ambulating with a walker. Pain is present but manageable.     Return in about 3 months (around 10/31/2024).         Rockie Agent, MD  [facility] [phone] (phone) [phone] (fax)  [facility]     [1]  Outpatient Medications Prior to Visit  Medication Sig   [EXPIRED] acetaminophen  (TYLENOL ) 500 MG tablet Take 2 tablets (1,000 mg total) by mouth every 8 (eight) hours as needed.   brimonidine  (ALPHAGAN ) 0.2 % ophthalmic solution Place 1 drop into both eyes See admin instructions. One drop in each eye twice a day at 8am and 3pm   Cholecalciferol  (VITAMIN D -3) 25 MCG (1000 UT) CAPS Take 1,000 Units by mouth in the morning.   docusate sodium  (COLACE) 100 MG capsule Take 100 mg by mouth every other day.   dorzolamide -timolol  (COSOPT ) 2-0.5 % ophthalmic solution Place 1 drop into both eyes See admin instructions. One drop in each  eye twice a day at 8am and 3pm   feeding supplement (ENSURE ENLIVE / ENSURE PLUS) LIQD Take 237 mLs by mouth 2 (two) times daily between meals.   Guaifenesin  1200 MG TB12 Take 1,200 mg by mouth 2 (two) times daily.   latanoprost  (XALATAN ) 0.005 % ophthalmic solution Place 1 drop into both eyes at bedtime.   losartan  (COZAAR ) 100 MG tablet TAKE 1 TABLET (100 MG TOTAL) BY MOUTH ONCE DAILY FOR 90 DAYS   lovastatin (MEVACOR) 40 MG tablet TAKE 1 TABLET (40 MG TOTAL) BY MOUTH DAILY WITH DINNER FOR 90 DAYS   metoprolol  tartrate (LOPRESSOR ) 25 MG tablet Take 0.5 tablets (12.5 mg total) by mouth 2 (two) times daily.   montelukast  (SINGULAIR ) 10 MG tablet TAKE 1 TABLET BY MOUTH EVERYDAY AT BEDTIME   Multiple Vitamin (MULTIVITAMIN) tablet Take 1 tablet by mouth daily.   [EXPIRED] mupirocin  ointment (BACTROBAN ) 2 % Place 1 Application into the nose 2 (two) times daily for 60 doses. Use as directed 2 times daily for 5 days every other week for 6 weeks.   omeprazole  (PRILOSEC) 40 MG capsule TAKE 1 CAPSULE BY MOUTH AT BEDTIME.   sertraline  (ZOLOFT ) 50 MG tablet TAKE 1 TABLET BY MOUTH EVERY DAY   tiZANidine  (ZANAFLEX ) 4 MG tablet Take 0.5-1.5 tablets (2-6 mg total) by mouth every 8 (eight) hours as needed for muscle spasms (muscle tightness).   traZODone  (DESYREL ) 50 MG tablet Take 1 tablet (50 mg total) by mouth at bedtime.   chlorhexidine  (HIBICLENS ) 4 % external liquid Apply 15 mLs (1 Application total) topically as directed for 30 doses. Use as directed daily for 5 days every other week for 6 weeks.   Chlorhexidine  Gluconate Cloth 2 % PADS Apply 6 each topically daily.   polyethylene glycol (MIRALAX ) 17 g packet Take 17 g by mouth daily.   No facility-administered medications prior to visit.

## 2024-08-03 ENCOUNTER — Telehealth: Payer: Self-pay

## 2024-08-03 DIAGNOSIS — J9611 Chronic respiratory failure with hypoxia: Secondary | ICD-10-CM

## 2024-08-03 NOTE — Telephone Encounter (Unsigned)
 Copied from CRM #[id number]. Topic: Clinical - Order For Equipment >> Aug 03, 2024 12:40 PM Hadassah PARAS wrote: Reason for CRM: Pt's daughter is on the line stating that [facility] does not provide oxygen. This is where to referall for the equipment was sent and this service is not provided. Please send order for this ASAP and advise pt's daughter Berwyn with update as pt is in high need for this.Please advise on #[phone]

## 2024-08-05 NOTE — Patient Instructions (Signed)
 To keep you healthy, please keep in mind the following health maintenance items that you are due for:   Health Maintenance Due  Topic Date Due   DTaP/Tdap/Td (1 - Tdap) Never done   Zoster Vaccines- Shingrix (1 of 2) Never done   COVID-19 Vaccine (8 - 2025-26 season) 04/23/2024     Best Wishes,   Dr. Lang

## 2024-08-06 DIAGNOSIS — M5136 Other intervertebral disc degeneration, lumbar region with discogenic back pain only: Secondary | ICD-10-CM | POA: Diagnosis not present

## 2024-08-06 DIAGNOSIS — G4700 Insomnia, unspecified: Secondary | ICD-10-CM | POA: Diagnosis not present

## 2024-08-06 DIAGNOSIS — T8452XA Infection and inflammatory reaction due to internal left hip prosthesis, initial encounter: Secondary | ICD-10-CM | POA: Diagnosis not present

## 2024-08-06 DIAGNOSIS — J849 Interstitial pulmonary disease, unspecified: Secondary | ICD-10-CM | POA: Diagnosis not present

## 2024-08-06 DIAGNOSIS — M1990 Unspecified osteoarthritis, unspecified site: Secondary | ICD-10-CM | POA: Diagnosis not present

## 2024-08-06 DIAGNOSIS — K8044 Calculus of bile duct with chronic cholecystitis without obstruction: Secondary | ICD-10-CM | POA: Diagnosis not present

## 2024-08-06 DIAGNOSIS — M869 Osteomyelitis, unspecified: Secondary | ICD-10-CM | POA: Diagnosis not present

## 2024-08-06 DIAGNOSIS — I10 Essential (primary) hypertension: Secondary | ICD-10-CM | POA: Diagnosis not present

## 2024-08-06 DIAGNOSIS — M80052D Age-related osteoporosis with current pathological fracture, left femur, subsequent encounter for fracture with routine healing: Secondary | ICD-10-CM | POA: Diagnosis not present

## 2024-08-06 DIAGNOSIS — B9562 Methicillin resistant Staphylococcus aureus infection as the cause of diseases classified elsewhere: Secondary | ICD-10-CM | POA: Diagnosis not present

## 2024-08-06 DIAGNOSIS — G4733 Obstructive sleep apnea (adult) (pediatric): Secondary | ICD-10-CM | POA: Diagnosis not present

## 2024-08-06 DIAGNOSIS — K219 Gastro-esophageal reflux disease without esophagitis: Secondary | ICD-10-CM | POA: Diagnosis not present

## 2024-08-06 NOTE — Telephone Encounter (Signed)
 Updated DME order has been sent

## 2024-08-07 ENCOUNTER — Ambulatory Visit: Payer: Self-pay | Admitting: Infectious Diseases

## 2024-08-07 ENCOUNTER — Encounter: Payer: Self-pay | Admitting: Infectious Diseases

## 2024-08-07 ENCOUNTER — Other Ambulatory Visit: Payer: Self-pay

## 2024-08-07 ENCOUNTER — Telehealth: Payer: Self-pay

## 2024-08-07 VITALS — BP 78/58 | HR 65 | Ht 64.0 in | Wt 151.0 lb

## 2024-08-07 DIAGNOSIS — Z452 Encounter for adjustment and management of vascular access device: Secondary | ICD-10-CM | POA: Insufficient documentation

## 2024-08-07 DIAGNOSIS — M869 Osteomyelitis, unspecified: Secondary | ICD-10-CM | POA: Insufficient documentation

## 2024-08-07 DIAGNOSIS — L089 Local infection of the skin and subcutaneous tissue, unspecified: Secondary | ICD-10-CM | POA: Insufficient documentation

## 2024-08-07 DIAGNOSIS — Z5181 Encounter for therapeutic drug level monitoring: Secondary | ICD-10-CM | POA: Diagnosis not present

## 2024-08-07 LAB — SUSCEPTIBILITY, AER + ANAEROB

## 2024-08-07 MED ORDER — SULFAMETHOXAZOLE-TRIMETHOPRIM 400-80 MG PO TABS: 1.0000 | ORAL_TABLET | Freq: Two times a day (BID) | ORAL | 0 refills | Status: AC

## 2024-08-07 NOTE — Progress Notes (Unsigned)
 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 07/02/2024   Chronic hyponatremia 01/05/2024   Choledocholithiasis with chronic cholecystitis 04/28/2023   Localized osteoarthritis of left knee 03/22/2023   Calculus of gallbladder and bile duct with obstruction without cholecystitis 03/22/2023   Postmenopausal estrogen deficiency 11/26/2022   At risk for pill esophagitis 08/10/2022   Adjustment insomnia 07/21/2022   Other fatigue 07/21/2022   Loosening of intramedullary nail 04/27/2022   Degenerative disk disease 04/27/2022   Interstitial lung disease (HCC) 12/25/2021   Hyperlipidemia, mixed 09/07/2021   Obstructive sleep apnea 01/29/2014   GERD (gastroesophageal reflux disease) 12/26/2012   Depression 12/26/2012   Essential hypertension 11/07/2002    Patient's Medications  New Prescriptions   SULFAMETHOXAZOLE -TRIMETHOPRIM  (BACTRIM ) 400-80 MG TABLET    Take 1 tablet by mouth 2 (two) times daily for 10 days.  Previous Medications   BRIMONIDINE  (ALPHAGAN ) 0.2 % OPHTHALMIC SOLUTION    Place 1 drop into both eyes See admin instructions. One drop in each eye twice a day at 8am and 3pm   CHOLECALCIFEROL  (VITAMIN D -3) 25 MCG (1000 UT) CAPS    Take 1,000 Units by mouth in the morning.   DOCUSATE SODIUM  (COLACE) 100 MG CAPSULE    Take 100 mg by mouth every other day.   DORZOLAMIDE -TIMOLOL  (COSOPT ) 2-0.5 % OPHTHALMIC SOLUTION    Place 1 drop into both eyes See admin instructions. One drop in each eye twice a day at 8am and 3pm   FEEDING SUPPLEMENT (ENSURE ENLIVE / ENSURE PLUS) LIQD    Take 237 mLs by mouth 2 (two) times daily between meals.   GUAIFENESIN  1200 MG TB12    Take 1,200 mg by mouth 2 (two) times daily.   LATANOPROST  (XALATAN ) 0.005 %  OPHTHALMIC SOLUTION    Place 1 drop into both eyes at bedtime.   LOSARTAN  (COZAAR ) 100 MG TABLET    TAKE 1 TABLET (100 MG TOTAL) BY MOUTH ONCE DAILY FOR 90 DAYS   LOVASTATIN (MEVACOR) 40 MG TABLET    TAKE 1 TABLET (40 MG TOTAL) BY MOUTH DAILY WITH DINNER FOR 90 DAYS   METOPROLOL  TARTRATE (LOPRESSOR ) 25 MG TABLET    Take 0.5 tablets (12.5 mg total) by mouth 2 (two) times daily.   MONTELUKAST  (SINGULAIR ) 10 MG TABLET    TAKE 1 TABLET BY MOUTH EVERYDAY AT BEDTIME   MULTIPLE VITAMIN (MULTIVITAMIN) TABLET    Take 1 tablet by mouth daily.   OMEPRAZOLE  (PRILOSEC) 40 MG CAPSULE    TAKE 1 CAPSULE BY MOUTH AT BEDTIME.   SERTRALINE  (ZOLOFT ) 50 MG TABLET    TAKE 1 TABLET BY MOUTH EVERY DAY   TIZANIDINE  (ZANAFLEX ) 4 MG TABLET    Take 0.5-1.5 tablets (2-6 mg total) by mouth every 8 (eight) hours as needed for muscle spasms (muscle tightness).   TRAZODONE  (DESYREL ) 50 MG TABLET    Take 1 tablet (50 mg total) by mouth at bedtime.  Modified Medications   No medications on file  Discontinued Medications   No medications on file    Subjective: 89 year old female with prior history as below who sustained a left intertrochanteric femur fracture fixed in May 2023, complicated by nonunion and underwent a revision fixation in September 2023 followed by wound healing issues with persistent drainage and underwent  debridement in November 1 and  July 28, 2022 (Both OR cx NG) which ultimately healed. 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  She had an inflammatory markers which was elevated per orthopedics.  Aspiration was attempted which yielded a dry tap.  Her incisions and wounds were well-healed at that time but decision was made for proximal femur replacement to remove the nonunion proximal femoral bone  and convert it to arthroplasty to allow for immediate weightbearing and hence admitted 10/30-11/17. Discharged on 11/17 to complete  6 weeks of IV daptomycin  through 12/25 via PICC.   08/07/24 Accompanied by daughter. Getting IV daptomycin  through PICC without missed doses or concerns. Left hip wound has healed. Next appt with Orthopedics is in January. She is ambulatory with walker. No concerns.   Pull PICC after last dose on 11/25 then bactrim  ss 1 tab po bid and fu in 12/30  Review of Systems: all systems reviewed with pertinent positives and negatives as listed above   Past Medical History:  Diagnosis Date   Allergy    Anxiety    Arthritis    Calculus of gallbladder and bile duct with obstruction without cholecystitis    Cataract    Choledocholithiasis    Colon cancer (HCC)    Constipation    DDD (degenerative disc disease), lumbar    Depression    Dyspnea    GERD (gastroesophageal reflux disease)    Glaucoma    Headache    Hip fracture (HCC) 12/25/2021   Hip fracture requiring operative repair (HCC) 2023   History of hiatal hernia    HLD (hyperlipidemia)    Hypertension    Interstitial lung disease (HCC)    Left hip postoperative wound infection    Obstructive sleep apnea 01/29/2014   No Cpap   Osteomyelitis (HCC)    Osteomyelitis (HCC) 06/24/2022   Osteoporosis    Rhabdomyolysis    Severe sepsis (HCC) 05/13/2023   Shingles    Past Surgical History:  Procedure Laterality Date   APPENDECTOMY     CARDIAC CATHETERIZATION     CHOLECYSTECTOMY     COLON SURGERY     COLONOSCOPY     CONVERSION TO TOTAL HIP Left 07/02/2024   Procedure: CONVERSION, PREVIOUS HIP SURGERY, TO TOTAL HIP ARTHROPLASTY;  Surgeon: Edna Toribio LABOR, MD;  Location: [facility];  Service: Orthopedics;  Laterality: Left;   ENDOSCOPIC RETROGRADE CHOLANGIOPANCREATOGRAPHY (ERCP) WITH PROPOFOL  N/A 04/28/2023   Procedure: ENDOSCOPIC RETROGRADE CHOLANGIOPANCREATOGRAPHY (ERCP) WITH PROPOFOL ;  Surgeon: Jinny Carmine, MD;  Location: [facility] END[facility]COPY;  Service: Endoscopy;  Laterality: N/A;   EYE SURGERY Bilateral    cataracts   FEMUR IM NAIL Left 04/28/2022   Procedure: REVISION FIXATION OF LEFT FEMUR FRACTURE;  Surgeon: Kendal Franky SQUIBB, MD;  Location: [facility] OR;  Service: Orthopedics;  Laterality: Left;   FRACTURE SURGERY     HARDWARE REMOVAL Left 04/28/2022   Procedure: HARDWARE REMOVAL;  Surgeon: Kendal Franky SQUIBB, MD;  Location: [facility] OR;  Service: Orthopedics;  Laterality: Left;   HERNIA REPAIR     I & D EXTREMITY Left 06/23/2022   Procedure: IRRIGATION AND DEBRIDEMENT LEFT HIP;  Surgeon: Kendal Franky SQUIBB, MD;  Location: [facility] OR;  Service: Orthopedics;  Laterality: Left;   I & D EXTREMITY Left 07/28/2022   Procedure: IRRIGATION AND DEBRIDEMENT HIP;  Surgeon: Kendal Franky SQUIBB, MD;  Location: [facility] OR;  Service: Orthopedics;  Laterality: Left;   INTRAMEDULLARY (IM) NAIL INTERTROCHANTERIC Left 12/26/2021   Procedure: INTRAMEDULLARY (IM) NAIL INTERTROCHANTRIC;  Surgeon: Rollene Cough, MD;  Location: [facility] ORS;  Service: Orthopedics;  Laterality: Left;   MULTIPLE TOOTH EXTRACTIONS     full dentures   REMOVAL OF STONES  04/28/2023   Procedure: REMOVAL OF STONES;  Surgeon: Jinny Carmine, MD;  Location: [facility] ENDOSCOPY;  Service: Endoscopy;;   shoulder  surgery Left    SPHINCTEROTOMY  04/28/2023   Procedure: SPHINCTEROTOMY;  Surgeon: Jinny Carmine, MD;  Location: [facility] ENDOSCOPY;  Service: Endoscopy;;   TONSILLECTOMY     TUBAL LIGATION     UPPER GI ENDOSCOPY     WRIST SURGERY Right     Social History[1]  Family History  Problem Relation Age of Onset   Hypertension Mother    Heart disease Mother    Heart disease Father     Allergies[2]  Health Maintenance  Topic Date Due   DTaP/Tdap/Td (1 - Tdap) Never done   Zoster Vaccines- Shingrix (1 of 2) Never done   COVID-19 Vaccine (8 - 2025-26 season) 04/23/2024   Medicare Annual Wellness (AWV)  04/12/2025   Pneumococcal Vaccine: 50+ Years  Completed    Influenza Vaccine  Completed   Bone Density Scan  Completed   Meningococcal B Vaccine  Aged Out    Objective: BP (!) 78/58 Comment: pt daughter states bp runs low in left arm picc in right arm  Pulse 65   Ht 5' 4 (1.626 m)   Wt 151 lb (68.5 kg)   LMP  (LMP Unknown)   SpO2 95%   BMI 25.92 kg/m    Physical Exam Constitutional:      Appearance: Normal appearance.  HENT:     Head: Normocephalic and atraumatic.      Mouth: Mucous membranes are moist.  Eyes:    Conjunctiva/sclera: Conjunctivae normal.     Pupils: Pupils are equal, round, and b/l symmetrical    Cardiovascular:     Rate and Rhythm: Normal rate     Heart sounds:   Pulmonary:     Effort: Pulmonary effort is normal.     Breath sounds:  Abdominal:     General: Non distended     Palpations:   Musculoskeletal:        General: ambulatory with walker  Skin:    General: Skin is warm and dry.     Comments: rt arm PICC okay with no signs of infection   Neurological:     General: grossly non focal     Mental Status: awake, alert   Psychiatric:        Mood and Affect: Mood normal.   Lab Results Lab Results  Component Value Date   WBC 9.0 07/08/2024   HGB 8.2 (L) 07/08/2024   HCT 25.0 (L) 07/08/2024   MCV 105.0 (H) 07/08/2024   PLT 313 07/08/2024    Lab Results  Component Value Date   CREATININE 0.43 (L) 07/06/2024   BUN 8 07/06/2024   NA 129 (L) 07/06/2024   K 4.2 07/06/2024   CL 96 (L) 07/06/2024   CO2 25 07/06/2024    Lab Results  Component Value Date   ALT 8 06/22/2024   AST 24 06/22/2024   ALKPHOS 67 06/22/2024   BILITOT 0.4 06/22/2024    Lab Results  Component Value Date   CHOL 148 03/26/2024   HDL 72 03/26/2024  LDLCALC 58 03/26/2024   TRIG 98 03/26/2024   CHOLHDL 2.1 03/26/2024   No results found for: LABRPR, RPRTITER No results found for: HIV1RNAQUANT, HIV1RNAVL, CD4TABS   Assessment/Plan # Possible infected left intertrochanteric fracture with chronic  nonunion - 11/10 conversion left intertrochanteric fracture  to total hip arthroplasty.  Successful removal of the intramedullary nail, 1 distal broken distal interlock screw left.  Successful implantation of total hip arthroplasty. All hardware out except distal interlock screw embedded in bone per Dr Edna. OR culture Staph epidermidis 2 out of 3 samples.  GPC 3 out of 3 samples, GPR in 1 out of 3 samples and Gram stain - CRP 15.7, ESR 15  Plan  - complete 6 weeks course of IV daptomycin  through 12/25 - Pull PICC after last dose on 12/25 - Plan to start Bactrim  SS 1 tab po bid from 12/26, plan for 3 months  - fu in 12/30  to check BMP to monitor k and Cr, discussed plan with daughter - fu with Ortho as planned  # Medication Monitoring  - 12/15 CBC and BMP unremarkable, absolute eosinophil count 500, ESR 88, CRP 79, CK 213 - 12/8 ESR 48, CRP 43  - CRP improving   # Chronic Resp failure  - satting well on RA - Cxray ordered by PCP - low concerns for eosinophilic pna currently  I spent 30 minutes involved in face-to-face and non-face-to-face activities for this patient on the day of the visit. Professional time spent includes the following activities: Preparing to see the patient (review of tests), Obtaining and reviewing separately obtained history (discharge record 11/17, PCP note 12/11), Performing a medically appropriate examination and evaluation, Ordering medications, Documenting clinical information in the EMR, Independently interpreting results (not separately reported), Communicating results to the patient/daughter, Counseling and educating the patient/daughter and Care coordination (not separately reported).   Of note, portions of this note may have been created with voice recognition software. While this note has been edited for accuracy, occasional wrong-word or sound-a-like substitutions may have occurred due to the inherent limitations of voice recognition software.    Annalee Joseph, MD [facility] 08/07/2024, 4:07 PM                                                              [1]  Social History Tobacco Use   Smoking status: Never    Passive exposure: Never   Smokeless tobacco: Never  Vaping Use   Vaping status: Never Used  Substance Use Topics   Alcohol  use: Not Currently   Drug use: Never  [2] No Known Allergies

## 2024-08-07 NOTE — Telephone Encounter (Signed)
 Per Dr. Dea the end date to stop IV abx after last dose and pull picc is on 12/25. Sent message to Holley Herring Rn [employer] about orders.

## 2024-08-08 DIAGNOSIS — J849 Interstitial pulmonary disease, unspecified: Secondary | ICD-10-CM | POA: Diagnosis not present

## 2024-08-08 DIAGNOSIS — K219 Gastro-esophageal reflux disease without esophagitis: Secondary | ICD-10-CM | POA: Diagnosis not present

## 2024-08-08 DIAGNOSIS — G4733 Obstructive sleep apnea (adult) (pediatric): Secondary | ICD-10-CM | POA: Diagnosis not present

## 2024-08-08 DIAGNOSIS — E782 Mixed hyperlipidemia: Secondary | ICD-10-CM | POA: Diagnosis not present

## 2024-08-08 DIAGNOSIS — M869 Osteomyelitis, unspecified: Secondary | ICD-10-CM | POA: Diagnosis not present

## 2024-08-08 DIAGNOSIS — F419 Anxiety disorder, unspecified: Secondary | ICD-10-CM | POA: Diagnosis not present

## 2024-08-08 DIAGNOSIS — G4700 Insomnia, unspecified: Secondary | ICD-10-CM | POA: Diagnosis not present

## 2024-08-08 DIAGNOSIS — B9562 Methicillin resistant Staphylococcus aureus infection as the cause of diseases classified elsewhere: Secondary | ICD-10-CM | POA: Diagnosis not present

## 2024-08-08 DIAGNOSIS — I10 Essential (primary) hypertension: Secondary | ICD-10-CM | POA: Diagnosis not present

## 2024-08-08 DIAGNOSIS — T8452XA Infection and inflammatory reaction due to internal left hip prosthesis, initial encounter: Secondary | ICD-10-CM | POA: Diagnosis not present

## 2024-08-08 DIAGNOSIS — F32A Depression, unspecified: Secondary | ICD-10-CM | POA: Diagnosis not present

## 2024-08-08 DIAGNOSIS — K8044 Calculus of bile duct with chronic cholecystitis without obstruction: Secondary | ICD-10-CM | POA: Diagnosis not present

## 2024-08-09 ENCOUNTER — Ambulatory Visit
Admission: RE | Admit: 2024-08-09 | Discharge: 2024-08-09 | Disposition: A | Source: Ambulatory Visit | Attending: Family Medicine

## 2024-08-09 ENCOUNTER — Ambulatory Visit: Admission: RE | Admit: 2024-08-09 | Source: Home / Self Care

## 2024-08-09 DIAGNOSIS — R0602 Shortness of breath: Secondary | ICD-10-CM | POA: Insufficient documentation

## 2024-08-13 DIAGNOSIS — T8452XA Infection and inflammatory reaction due to internal left hip prosthesis, initial encounter: Secondary | ICD-10-CM | POA: Diagnosis not present

## 2024-08-17 LAB — ACID FAST CULTURE WITH REFLEXED SENSITIVITIES (MYCOBACTERIA)
Acid Fast Culture: NEGATIVE
Acid Fast Culture: NEGATIVE
Acid Fast Culture: NEGATIVE

## 2024-08-20 ENCOUNTER — Telehealth: Payer: Self-pay

## 2024-08-20 NOTE — Telephone Encounter (Signed)
 Copied from CRM #[id number]. Topic: Clinical - Home Health Verbal Orders >> Aug 20, 2024  4:17 PM Roselie BROCKS wrote: Caller/Agency: [facility],  Callback Number: [phone], no call back needed Service Requested: Physical Therapy Frequency: pick line removed last week. No longer needs pick line, no nursing is needed any longer,requests to be  discharged Any new concerns about the patient? No

## 2024-08-20 NOTE — Telephone Encounter (Signed)
 Ok for verbal orders.    Rockie Agent, MD, DABOM [facility]

## 2024-08-30 ENCOUNTER — Ambulatory Visit: Payer: Self-pay | Admitting: Family Medicine

## 2024-08-30 DIAGNOSIS — R911 Solitary pulmonary nodule: Secondary | ICD-10-CM

## 2024-09-04 ENCOUNTER — Telehealth: Payer: Self-pay

## 2024-09-04 ENCOUNTER — Other Ambulatory Visit: Payer: Self-pay

## 2024-09-04 ENCOUNTER — Ambulatory Visit: Payer: Self-pay | Admitting: Infectious Diseases

## 2024-09-04 VITALS — BP 77/56 | HR 70 | Wt 144.0 lb

## 2024-09-04 DIAGNOSIS — Z5181 Encounter for therapeutic drug level monitoring: Secondary | ICD-10-CM

## 2024-09-04 DIAGNOSIS — M869 Osteomyelitis, unspecified: Secondary | ICD-10-CM

## 2024-09-04 DIAGNOSIS — Z96642 Presence of left artificial hip joint: Secondary | ICD-10-CM

## 2024-09-04 DIAGNOSIS — A498 Other bacterial infections of unspecified site: Secondary | ICD-10-CM | POA: Insufficient documentation

## 2024-09-04 DIAGNOSIS — S72102K Unspecified trochanteric fracture of left femur, subsequent encounter for closed fracture with nonunion: Secondary | ICD-10-CM

## 2024-09-04 MED ORDER — SULFAMETHOXAZOLE-TRIMETHOPRIM 400-80 MG PO TABS: 1.0000 | ORAL_TABLET | Freq: Two times a day (BID) | ORAL | 0 refills | Status: AC

## 2024-09-04 NOTE — Telephone Encounter (Signed)
 Copied from CRM [id number]. Topic: Clinical - Order For Equipment >> Sep 04, 2024 11:40 AM Gattis SQUIBB wrote: Reason for CRM:   Verneita with [facility] called saying Dr. Lang sent an order for oxygen for the patient and they said they need some information.  They need 3 saturations on a walk test and ox sat resting on room air and also sat for ambulation on room air , recovery sat on oxygen.  Basically sitting, walking, and if it drops they need recovery level with CO2  CB# [phone]

## 2024-09-04 NOTE — Telephone Encounter (Signed)
 Please schedule OV to obtain these required tests. Ok to use same day or hospital follow up  or newborn slot

## 2024-09-04 NOTE — Progress Notes (Signed)
 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 07/02/2024   Chronic hyponatremia 01/05/2024   Choledocholithiasis with chronic cholecystitis 04/28/2023   Localized osteoarthritis of left knee 03/22/2023   Calculus of gallbladder and bile duct with obstruction without cholecystitis 03/22/2023   Postmenopausal estrogen deficiency 11/26/2022   At risk for pill esophagitis 08/10/2022   Adjustment insomnia 07/21/2022   Other fatigue 07/21/2022   Loosening of intramedullary nail 04/27/2022   Degenerative disk disease 04/27/2022   Interstitial lung disease (HCC) 12/25/2021   Hyperlipidemia, mixed 09/07/2021   Obstructive sleep apnea 01/29/2014   GERD (gastroesophageal reflux disease) 12/26/2012   Depression 12/26/2012   Essential hypertension 11/07/2002    Patient's Medications  New Prescriptions   No medications on file  Previous Medications   BRIMONIDINE  (ALPHAGAN ) 0.2 % OPHTHALMIC SOLUTION    Place 1 drop into both eyes See admin instructions. One drop in each eye twice a day at 8am and 3pm   CHOLECALCIFEROL  (VITAMIN D -3) 25 MCG (1000 UT) CAPS    Take 1,000 Units by mouth in the morning.   DOCUSATE SODIUM  (COLACE) 100 MG CAPSULE    Take 100 mg by mouth every other day.   DORZOLAMIDE -TIMOLOL  (COSOPT ) 2-0.5 % OPHTHALMIC SOLUTION    Place 1 drop into both eyes See admin instructions. One drop in each eye twice a day at 8am and 3pm   FEEDING SUPPLEMENT (ENSURE ENLIVE / ENSURE PLUS) LIQD    Take 237 mLs by mouth 2 (two) times daily between meals.   GUAIFENESIN  1200 MG TB12    Take 1,200 mg by mouth 2 (two) times daily.   LATANOPROST  (XALATAN ) 0.005 % OPHTHALMIC SOLUTION    Place 1 drop into both eyes at bedtime.    LOSARTAN  (COZAAR ) 100 MG TABLET    TAKE 1 TABLET (100 MG TOTAL) BY MOUTH ONCE DAILY FOR 90 DAYS   LOVASTATIN (MEVACOR) 40 MG TABLET    TAKE 1 TABLET (40 MG TOTAL) BY MOUTH DAILY WITH DINNER FOR 90 DAYS   METOPROLOL  TARTRATE (LOPRESSOR ) 25 MG TABLET    Take 0.5 tablets (12.5 mg total) by mouth 2 (two) times daily.   MONTELUKAST  (SINGULAIR ) 10 MG TABLET    TAKE 1 TABLET BY MOUTH EVERYDAY AT BEDTIME   MULTIPLE VITAMIN (MULTIVITAMIN) TABLET    Take 1 tablet by mouth daily.   OMEPRAZOLE  (PRILOSEC) 40 MG CAPSULE    TAKE 1 CAPSULE BY MOUTH AT BEDTIME.   SERTRALINE  (ZOLOFT ) 50 MG TABLET    TAKE 1 TABLET BY MOUTH EVERY DAY   TIZANIDINE  (ZANAFLEX ) 4 MG TABLET    Take 0.5-1.5 tablets (2-6 mg total) by mouth every 8 (eight) hours as needed for muscle spasms (muscle tightness).   TRAZODONE  (DESYREL ) 50 MG TABLET    Take 1 tablet (50 mg total) by mouth at bedtime.  Modified Medications   No medications on file  Discontinued Medications   No medications on file    Subjective: 89 year old female with prior history as below who sustained a left intertrochanteric femur fracture fixed in May 2023, complicated by nonunion and underwent a revision fixation in September 2023 followed by wound healing issues with persistent drainage and underwent debridement in November 1 and  July 28, 2022 (Both  OR cx NG) which ultimately healed. 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  She had an inflammatory markers which was elevated per orthopedics.  Aspiration was attempted which yielded a dry tap.  Her incisions and wounds were well-healed at that time but decision was made for proximal femur replacement to remove the nonunion proximal femoral bone and convert it to arthroplasty to allow for immediate  weightbearing and hence admitted 10/30-11/17. Discharged on 11/17 to complete  6 weeks of IV daptomycin  through 12/25 via PICC.   08/07/24 Accompanied by daughter. Getting IV daptomycin  through PICC without missed doses or concerns. Left hip wound has healed. Next appt with Orthopedics is in January. She is ambulatory with walker. No concerns.   Pull PICC after last dose on 12/25 then bactrim  ss 1 tab po bid and fu in 12/30  1/13 Completed IV daptomycin  then, started on Bactrim  with no missed doses or concerns. Saw Dr Edna earlier today, plan to fu in 2 months. Left hip wound has healed. No other concerns.   Review of Systems: all systems reviewed with pertinent positives and negatives as listed above   Past Medical History:  Diagnosis Date   Allergy    Anxiety    Arthritis    Calculus of gallbladder and bile duct with obstruction without cholecystitis    Cataract    Choledocholithiasis    Colon cancer (HCC)    Constipation    DDD (degenerative disc disease), lumbar    Depression    Dyspnea    GERD (gastroesophageal reflux disease)    Glaucoma    Headache    Hip fracture (HCC) 12/25/2021   Hip fracture requiring operative repair (HCC) 2023   History of hiatal hernia    HLD (hyperlipidemia)    Hypertension    Interstitial lung disease (HCC)    Left hip postoperative wound infection    Obstructive sleep apnea 01/29/2014   No Cpap   Osteomyelitis (HCC)    Osteomyelitis (HCC) 06/24/2022   Osteoporosis    Rhabdomyolysis    Severe sepsis (HCC) 05/13/2023   Shingles    Past Surgical History:  Procedure Laterality Date   APPENDECTOMY     CARDIAC CATHETERIZATION     CHOLECYSTECTOMY     COLON SURGERY     COLONOSCOPY     CONVERSION TO TOTAL HIP Left 07/02/2024   Procedure: CONVERSION, PREVIOUS HIP SURGERY, TO TOTAL HIP ARTHROPLASTY;  Surgeon: Edna Toribio LABOR, MD;  Location: [facility];  Service: Orthopedics;  Laterality: Left;   ENDOSCOPIC RETROGRADE  CHOLANGIOPANCREATOGRAPHY (ERCP) WITH PROPOFOL  N/A 04/28/2023   Procedure: ENDOSCOPIC RETROGRADE CHOLANGIOPANCREATOGRAPHY (ERCP) WITH PROPOFOL ;  Surgeon: Jinny Carmine, MD;  Location: [facility];  Service: Endoscopy;  Laterality: N/A;   EYE SURGERY Bilateral    cataracts   FEMUR IM NAIL Left 04/28/2022   Procedure: REVISION FIXATION OF LEFT FEMUR FRACTURE;  Surgeon: Kendal Franky SQUIBB, MD;  Location: [facility];  Service: Orthopedics;  Laterality: Left;   FRACTURE SURGERY     HARDWARE REMOVAL Left 04/28/2022   Procedure: HARDWARE REMOVAL;  Surgeon: Kendal Franky SQUIBB, MD;  Location: [facility] OR;  Service: Orthopedics;  Laterality: Left;   HERNIA REPAIR     I & D EXTREMITY Left 06/23/2022   Procedure: IRRIGATION AND DEBRIDEMENT LEFT HIP;  Surgeon: Kendal Franky SQUIBB, MD;  Location: [facility];  Service: Orthopedics;  Laterality: Left;   I & D EXTREMITY Left 07/28/2022   Procedure: IRRIGATION AND DEBRIDEMENT HIP;  Surgeon: Kendal Franky SQUIBB, MD;  Location: [facility];  Service: Orthopedics;  Laterality: Left;   INTRAMEDULLARY (IM) NAIL INTERTROCHANTERIC Left 12/26/2021   Procedure: INTRAMEDULLARY (IM) NAIL INTERTROCHANTRIC;  Surgeon: Rollene Cough, MD;  Location: [facility];  Service: Orthopedics;  Laterality: Left;   MULTIPLE TOOTH EXTRACTIONS     full dentures   REMOVAL OF STONES  04/28/2023   Procedure: REMOVAL OF STONES;  Surgeon: Jinny Carmine, MD;  Location: [facility];  Service: Endoscopy;;   shoulder  surgery Left    SPHINCTEROTOMY  04/28/2023   Procedure: SPHINCTEROTOMY;  Surgeon: Jinny Carmine, MD;  Location: [facility];  Service: Endoscopy;;   TONSILLECTOMY     TUBAL LIGATION     UPPER GI ENDOSCOPY     WRIST SURGERY Right     Social History[1]  Family History  Problem Relation Age of Onset   Hypertension Mother    Heart disease Mother    Heart disease Father     Allergies[2]  Health Maintenance  Topic Date Due   DTaP/Tdap/Td (1 - Tdap) Never done   Zoster Vaccines- Shingrix (1 of 2)  Never done   COVID-19 Vaccine (8 - 2025-26 season) 04/23/2024   Medicare Annual Wellness (AWV)  04/12/2025   Pneumococcal Vaccine: 50+ Years  Completed   Influenza Vaccine  Completed   Bone Density Scan  Completed   Meningococcal B Vaccine  Aged Out    Objective: BP (!) 77/56   Pulse 70   Wt 144 lb (65.3 kg)   LMP  (LMP Unknown)   SpO2 95%   BMI 24.72 kg/m   Physical Exam Constitutional:      Appearance: Normal appearance.  HENT:     Head: Normocephalic and atraumatic.      Mouth: Mucous membranes are moist.  Eyes:    Conjunctiva/sclera: Conjunctivae normal.     Pupils: Pupils are equal, round, and b/l symmetrical    Cardiovascular:     Rate and Rhythm: Normal rate     Heart sounds:   Pulmonary:     Effort: Pulmonary effort is normal.     Breath sounds:  Abdominal:     General: Non distended     Palpations:   Musculoskeletal:        General: ambulatory with walker  Skin:    General: Skin is warm and dry.     Comments: Left hip wound has healed, no signs of infection   Neurological:     General: grossly non focal     Mental Status: awake, alert   Psychiatric:        Mood and Affect: Mood normal.   Lab Results Lab Results  Component Value Date   WBC 9.0 07/08/2024   HGB 8.2 (L) 07/08/2024   HCT 25.0 (L) 07/08/2024   MCV 105.0 (H) 07/08/2024   PLT 313 07/08/2024    Lab Results  Component Value Date   CREATININE 0.43 (L) 07/06/2024   BUN 8 07/06/2024   NA 129 (L) 07/06/2024   K 4.2 07/06/2024   CL 96 (L) 07/06/2024   CO2 25 07/06/2024    Lab Results  Component Value Date   ALT 8 06/22/2024   AST 24 06/22/2024   ALKPHOS 67 06/22/2024   BILITOT 0.4 06/22/2024    Lab Results  Component Value Date   CHOL 148 03/26/2024  HDL 72 03/26/2024   LDLCALC 58 03/26/2024   TRIG 98 03/26/2024   CHOLHDL 2.1 03/26/2024   No results found for: LABRPR, RPRTITER No results found for: HIV1RNAQUANT, HIV1RNAVL, CD4TABS   Assessment/Plan #  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  Successful removal of the intramedullary nail, 1 distal broken distal interlock screw left.  Successful implantation of total hip arthroplasty. All hardware out except distal interlock screw embedded in bone per Dr Edna. OR culture Staph epidermidis 2 out of 3 samples.  GPC 3 out of 3 samples, GPR in 1 out of 3 samples and Gram stain - s/p  6 weeks course of IV daptomycin  through 12/25> Bactrim  SS 1 tab po bid from 12/26, plan for 3 months   Plan  - CBC, BMP, ESR and CRP today - Continue Bactrim  SS tab po bid, refills sent - Discussed about plenty of hydration  - Fu in 2 weeks   # Medication Monitoring  - Labs today to monitor K, cr  I personally spent a total of 22 minutes in the care of the patient today including preparing to see the patient, performing a medically appropriate exam/evaluation, counseling and educating, placing orders, documenting clinical information in the EHR, independently interpreting results, and communicating results.   Annalee Joseph, MD [facility] 09/04/2024, 2:13 PM                                                               [1]  Social History Tobacco Use   Smoking status: Never    Passive exposure: Never   Smokeless tobacco: Never  Vaping Use   Vaping status: Never Used  Substance Use Topics   Alcohol  use: Not Currently   Drug use: Never  [2] No Known Allergies  "

## 2024-09-05 ENCOUNTER — Ambulatory Visit: Payer: Self-pay | Admitting: Infectious Diseases

## 2024-09-05 ENCOUNTER — Telehealth: Payer: Self-pay | Admitting: Family Medicine

## 2024-09-05 DIAGNOSIS — M869 Osteomyelitis, unspecified: Secondary | ICD-10-CM

## 2024-09-05 LAB — C-REACTIVE PROTEIN: CRP: <3 mg/L

## 2024-09-05 LAB — CBC
HCT: 35.5 % — ABNORMAL LOW (ref 35.9–46.0)
Hemoglobin: 11.4 g/dL — ABNORMAL LOW (ref 11.7–15.5)
MCH: 33.6 pg — ABNORMAL HIGH (ref 27.0–33.0)
MCHC: 32.1 g/dL (ref 31.6–35.4)
MCV: 104.7 fL — ABNORMAL HIGH (ref 81.4–101.7)
MPV: 9.6 fL (ref 7.5–12.5)
Platelets: 282 Thousand/uL (ref 140–400)
RBC: 3.39 Million/uL — ABNORMAL LOW (ref 3.80–5.10)
RDW: 15.7 % — ABNORMAL HIGH (ref 11.0–15.0)
WBC: 7.6 Thousand/uL (ref 3.8–10.8)

## 2024-09-05 LAB — BASIC METABOLIC PANEL WITH GFR
BUN: 17 mg/dL (ref 7–25)
CO2: 21 mmol/L (ref 20–32)
Calcium: 9.5 mg/dL (ref 8.6–10.4)
Chloride: 105 mmol/L (ref 98–110)
Creat: 0.74 mg/dL (ref 0.60–0.95)
Glucose, Bld: 120 mg/dL — ABNORMAL HIGH (ref 65–99)
Potassium: 4.5 mmol/L (ref 3.5–5.3)
Sodium: 133 mmol/L — ABNORMAL LOW (ref 135–146)
eGFR: 78 mL/min/1.73m2

## 2024-09-05 LAB — SEDIMENTATION RATE: Sed Rate: 43 mm/h — ABNORMAL HIGH (ref 0–30)

## 2024-09-05 NOTE — Telephone Encounter (Signed)
 Copied from CRM [id number]. Topic: Referral - Question >> Sep 05, 2024 11:10 AM Roselie BROCKS wrote: Reason for CRM: Patient had put orders in for oxygen ,but states she doesn't need it anymore to and to cancel the referral.

## 2024-09-05 NOTE — Progress Notes (Signed)
 Epic reminder set to check [facility] portal for results in 2 weeks.   Merikay Lesniewski, BSN, RN

## 2024-09-05 NOTE — Telephone Encounter (Signed)
 Spoke with daughter and scheduled appt for 09/26/24 at 10am.

## 2024-09-05 NOTE — Telephone Encounter (Signed)
 Spoke with patient's daughter, Berwyn, relayed per Dr. Dea that labs without significant abnormality and inflammatory markers improving.   She would prefer to have labs done in [place]. Repeat BMP order faxed to [facility] on [address] in [place]. She knows to have labs done in 2 weeks. Reminded her of follow up scheduled in February.   Morine Kohlman, BSN, RN

## 2024-09-05 NOTE — Telephone Encounter (Signed)
 Reviewed patient's request. We will reassess during her next follow up.

## 2024-09-07 ENCOUNTER — Ambulatory Visit
Admission: RE | Admit: 2024-09-07 | Discharge: 2024-09-07 | Disposition: A | Discharge status: Home / Self Care | Source: Ambulatory Visit | Attending: Family Medicine | Admitting: Family Medicine

## 2024-09-07 DIAGNOSIS — R911 Solitary pulmonary nodule: Secondary | ICD-10-CM | POA: Diagnosis present

## 2024-09-11 NOTE — Telephone Encounter (Signed)
 Received call from [facility] stating patient is there but they did not receive lab orders. Re-faxed BMP orders to [phone].  Tyland Klemens, BSN, RN

## 2024-09-14 ENCOUNTER — Ambulatory Visit: Payer: Self-pay | Admitting: Family Medicine

## 2024-09-19 ENCOUNTER — Ambulatory Visit: Payer: Self-pay

## 2024-09-19 NOTE — Telephone Encounter (Signed)
 FYI Only or Action Required?: FYI only for provider: appointment scheduled on 09/21/2024 at 11:20am with Mliss Spray FNP at [facility] due to no openings at patient's PCP office.  Patient was last seen in primary care on 08/02/2024 by Sharma Coyer, MD.  Called Nurse Triage reporting Neck Pain.  Symptoms began a week ago.  Interventions attempted: OTC medications: Tylenol  and over the counter arthritis cream and Rest, hydration, or home remedies.  Symptoms are: gradually worsening.  Triage Disposition: See PCP When Office is Open (Within 3 Days)  Patient/caregiver understands and will follow disposition?: Yes       Message from Lonell PEDLAR sent at 09/19/2024  2:56 PM EST  Reason for Triage: pain in neck and shoulders, believes can be related to arthritis   Reason for Disposition  [1] MODERATE neck pain (e.g., interferes with normal activities) AND [2] present > 3 days  Answer Assessment - Initial Assessment Questions Patient called and advised that she has arthritis in her shoulders and her neck She states that she is having a lot of pain Neck pain for a week Denies injuries/falls Has been taking Tylenol  6 out of 10 on pain scale Patient states the pain gives her a headache off and on  Patient has a slight headache during triage --over the counter arthritis cream and Tylenol  have been used  Patient denies chest pain, difficulty breathing, nausea, vomiting, diarrhea, fevers, changes in bowel/bladder control, numbness, tingling, weakness  Patient states that when she moves her head and neck the pain gets worse--it will run into her left shoulder  Patient asked about an appointment for Friday Her daughter is present with her at this time Appointment scheduled on 09/21/2024 at 11:20am with Mliss Spray FNP at [facility] due to no openings at patient's PCP office  Patient is advised to call us  back if anything changes or with any  further questions/concerns. Patient is advised that if anything worsens to go to the Emergency Room. Patient verbalized understanding.  Answer Assessment - Initial Assessment Questions Patient called and advised that she has arthritis in her shoulders and her neck She states that she has been having neck pain for a week Denies injuries/falls Has been taking Tylenol  6 out of 10 on pain scale Patient states the pain gives her a headache off and on  Patient has a slight headache during triage --over the counter arthritis cream and Tylenol  have been used  Patient denies chest pain, difficulty breathing, nausea, vomiting, diarrhea, fevers, changes in bowel/bladder control, numbness, tingling, weakness  Patient states that when she moves her head and neck the pain gets worse--it will run into her left shoulder  Patient asked about an appointment for Friday Her daughter is present with her at this time Appointment scheduled on 09/21/2024 at 11:20am with Mliss Spray FNP at [facility] due to no openings at patient's PCP office  Patient is advised to call us  back if anything changes or with any further questions/concerns. Patient is advised that if anything worsens to go to the Emergency Room. Patient verbalized understanding.  Protocols used: Neck Pain or Stiffness-A-AH, Shoulder Pain-A-AH

## 2024-09-21 ENCOUNTER — Other Ambulatory Visit: Payer: Self-pay | Admitting: Family Medicine

## 2024-09-21 ENCOUNTER — Ambulatory Visit: Admitting: Nurse Practitioner

## 2024-09-26 ENCOUNTER — Ambulatory Visit: Admitting: Family Medicine

## 2024-09-26 NOTE — Telephone Encounter (Signed)
 Noted.

## 2024-09-26 NOTE — Telephone Encounter (Signed)
 Spoke with Berwyn daughter they stated they received a call from orthopedics regarding her hip. I advised she should contact them as referral was placed in Aug and is now closed. Bit of a miscommunication daughter ended our call to answer another call. But she has been advised to contact the orthopedics office as we have not placed any recent referrals for her hip.  E2C2- please advise to contact ortho office since they gave her a call.

## 2024-09-26 NOTE — Telephone Encounter (Signed)
 PT Telephone encounter reviewed.

## 2024-09-26 NOTE — Telephone Encounter (Signed)
 The referral from her hip looks like it was placed in Aug 2025.   Does she need a new referral for neck paiN?

## 2024-09-26 NOTE — Telephone Encounter (Signed)
"  Copied from CRM [id number]. Topic: Referral - Status >> Sep 24, 2024 11:25 AM Victoria B wrote: Reason for CRM: Patient's daughter states patient doesn't need referral for right hip. She isnt sure why that came about. Please cb to discuss if need be >> Sep 26, 2024  2:03 PM Montie POUR wrote: I let daughter know that note in chart said for her to call ortho office. Daughter, Berwyn, is stating that her mom does not need this referral ortho office. No call back is needed because referral was from 05/2024. She does not need any information or appointment about her neck. Everything is good and no call is needed.   "

## 2024-09-27 ENCOUNTER — Other Ambulatory Visit: Payer: Self-pay | Admitting: Family Medicine

## 2024-10-03 ENCOUNTER — Ambulatory Visit: Payer: Self-pay | Admitting: Internal Medicine

## 2024-11-07 ENCOUNTER — Ambulatory Visit: Admitting: Family Medicine
